# Patient Record
Sex: Male | Born: 1985 | Race: White | Hispanic: No | Marital: Single | Smoking: Current every day smoker
Health system: Southern US, Community
[De-identification: ages and names within clinical notes are randomized; demographics above are authoritative.]

## PROBLEM LIST (undated history)

## (undated) DIAGNOSIS — F17210 Nicotine dependence, cigarettes, uncomplicated: Secondary | ICD-10-CM

## (undated) DIAGNOSIS — J301 Allergic rhinitis due to pollen: Secondary | ICD-10-CM

## (undated) DIAGNOSIS — G5603 Carpal tunnel syndrome, bilateral upper limbs: Secondary | ICD-10-CM

## (undated) DIAGNOSIS — B019 Varicella without complication: Secondary | ICD-10-CM

## (undated) DIAGNOSIS — E781 Pure hyperglyceridemia: Secondary | ICD-10-CM

## (undated) DIAGNOSIS — K8689 Other specified diseases of pancreas: Secondary | ICD-10-CM

## (undated) DIAGNOSIS — L02412 Cutaneous abscess of left axilla: Secondary | ICD-10-CM

## (undated) DIAGNOSIS — R7989 Other specified abnormal findings of blood chemistry: Secondary | ICD-10-CM

## (undated) DIAGNOSIS — E119 Type 2 diabetes mellitus without complications: Secondary | ICD-10-CM

## (undated) DIAGNOSIS — Z9889 Other specified postprocedural states: Secondary | ICD-10-CM

## (undated) DIAGNOSIS — R112 Nausea with vomiting, unspecified: Secondary | ICD-10-CM

## (undated) DIAGNOSIS — E785 Hyperlipidemia, unspecified: Secondary | ICD-10-CM

## (undated) DIAGNOSIS — K859 Acute pancreatitis without necrosis or infection, unspecified: Secondary | ICD-10-CM

## (undated) DIAGNOSIS — F32A Depression, unspecified: Secondary | ICD-10-CM

## (undated) DIAGNOSIS — R42 Dizziness and giddiness: Secondary | ICD-10-CM

## (undated) DIAGNOSIS — F419 Anxiety disorder, unspecified: Secondary | ICD-10-CM

## (undated) DIAGNOSIS — R935 Abnormal findings on diagnostic imaging of other abdominal regions, including retroperitoneum: Secondary | ICD-10-CM

## (undated) DIAGNOSIS — K8020 Calculus of gallbladder without cholecystitis without obstruction: Secondary | ICD-10-CM

## (undated) HISTORY — DX: Varicella without complication: B01.9

## (undated) HISTORY — DX: Acute pancreatitis without necrosis or infection, unspecified: K85.90

## (undated) HISTORY — DX: Depression, unspecified: F32.A

## (undated) HISTORY — DX: Type 2 diabetes mellitus without complications: E11.9

## (undated) HISTORY — DX: Morbid (severe) obesity due to excess calories: E66.01

## (undated) HISTORY — DX: Allergic rhinitis due to pollen: J30.1

## (undated) HISTORY — DX: Hyperlipidemia, unspecified: E78.5

---
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
## 2021-01-07 ENCOUNTER — Inpatient Hospital Stay (HOSPITAL_COMMUNITY)
Admission: EM | Admit: 2021-01-07 | Discharge: 2021-01-11 | DRG: 637 | Disposition: A | Discharge status: Home / Self Care | Payer: Self-pay | Attending: Internal Medicine | Admitting: Internal Medicine

## 2021-01-07 ENCOUNTER — Emergency Department (HOSPITAL_COMMUNITY): Payer: Self-pay

## 2021-01-07 ENCOUNTER — Encounter (HOSPITAL_COMMUNITY): Payer: Self-pay | Admitting: Emergency Medicine

## 2021-01-07 DIAGNOSIS — R000 Tachycardia, unspecified: Secondary | ICD-10-CM | POA: Diagnosis present

## 2021-01-07 DIAGNOSIS — Z6834 Body mass index (BMI) 34.0-34.9, adult: Secondary | ICD-10-CM

## 2021-01-07 DIAGNOSIS — Z20822 Contact with and (suspected) exposure to covid-19: Secondary | ICD-10-CM | POA: Diagnosis present

## 2021-01-07 DIAGNOSIS — M62830 Muscle spasm of back: Secondary | ICD-10-CM | POA: Diagnosis not present

## 2021-01-07 DIAGNOSIS — E1110 Type 2 diabetes mellitus with ketoacidosis without coma: Principal | ICD-10-CM | POA: Diagnosis present

## 2021-01-07 DIAGNOSIS — F17210 Nicotine dependence, cigarettes, uncomplicated: Secondary | ICD-10-CM | POA: Diagnosis present

## 2021-01-07 DIAGNOSIS — E669 Obesity, unspecified: Secondary | ICD-10-CM | POA: Diagnosis present

## 2021-01-07 DIAGNOSIS — Z79899 Other long term (current) drug therapy: Secondary | ICD-10-CM

## 2021-01-07 DIAGNOSIS — R030 Elevated blood-pressure reading, without diagnosis of hypertension: Secondary | ICD-10-CM | POA: Diagnosis present

## 2021-01-07 DIAGNOSIS — K859 Acute pancreatitis without necrosis or infection, unspecified: Secondary | ICD-10-CM

## 2021-01-07 DIAGNOSIS — E7800 Pure hypercholesterolemia, unspecified: Secondary | ICD-10-CM | POA: Diagnosis present

## 2021-01-07 DIAGNOSIS — E876 Hypokalemia: Secondary | ICD-10-CM | POA: Diagnosis not present

## 2021-01-07 DIAGNOSIS — E781 Pure hyperglyceridemia: Secondary | ICD-10-CM | POA: Diagnosis present

## 2021-01-07 DIAGNOSIS — K858 Other acute pancreatitis without necrosis or infection: Secondary | ICD-10-CM | POA: Diagnosis present

## 2021-01-07 DIAGNOSIS — Z833 Family history of diabetes mellitus: Secondary | ICD-10-CM

## 2021-01-07 LAB — LIPID PANEL
Cholesterol: 308 mg/dL — ABNORMAL HIGH (ref 0–200)
Triglycerides: 1651 mg/dL — ABNORMAL HIGH (ref <150)
VLDL: UNDETERMINED mg/dL (ref 0–40)

## 2021-01-07 LAB — URINALYSIS, ROUTINE W REFLEX MICROSCOPIC
Bacteria, UA: NONE SEEN
Bilirubin Urine: NEGATIVE
Glucose, UA: >=500 mg/dL — AB
Ketones, ur: 80 mg/dL — AB
Leukocytes,Ua: NEGATIVE
Nitrite: NEGATIVE
Protein, ur: 30 mg/dL — AB
Specific Gravity, Urine: 1.032 — ABNORMAL HIGH (ref 1.005–1.030)
pH: 5 (ref 5.0–8.0)

## 2021-01-07 LAB — BASIC METABOLIC PANEL
Anion gap: 16 — ABNORMAL HIGH (ref 5–15)
Anion gap: 9 (ref 5–15)
BUN: 11 mg/dL (ref 6–20)
BUN: <5 mg/dL — ABNORMAL LOW (ref 6–20)
CO2: 17 mmol/L — ABNORMAL LOW (ref 22–32)
CO2: 19 mmol/L — ABNORMAL LOW (ref 22–32)
Calcium: 7.9 mg/dL — ABNORMAL LOW (ref 8.9–10.3)
Calcium: 8 mg/dL — ABNORMAL LOW (ref 8.9–10.3)
Chloride: 95 mmol/L — ABNORMAL LOW (ref 98–111)
Chloride: 99 mmol/L (ref 98–111)
Creatinine, Ser: 0.94 mg/dL (ref 0.61–1.24)
Creatinine, Ser: 0.67 mg/dL (ref 0.61–1.24)
GFR, Estimated: >60 mL/min (ref >60)
GFR, Estimated: >60 mL/min (ref >60)
Glucose, Bld: 338 mg/dL — ABNORMAL HIGH (ref 70–99)
Glucose, Bld: 222 mg/dL — ABNORMAL HIGH (ref 70–99)
Potassium: 4.4 mmol/L (ref 3.5–5.1)
Potassium: 3.7 mmol/L (ref 3.5–5.1)
Sodium: 128 mmol/L — ABNORMAL LOW (ref 135–145)
Sodium: 127 mmol/L — ABNORMAL LOW (ref 135–145)

## 2021-01-07 LAB — CBC
HCT: 51.3 % (ref 39.0–52.0)
Hemoglobin: 18.2 g/dL — ABNORMAL HIGH (ref 13.0–17.0)
MCH: 29.6 pg (ref 26.0–34.0)
MCHC: 35.5 g/dL (ref 30.0–36.0)
MCV: 83.6 fL (ref 80.0–100.0)
Platelets: 342 10*3/uL (ref 150–400)
RBC: 6.14 MIL/uL — ABNORMAL HIGH (ref 4.22–5.81)
RDW: 12.6 % (ref 11.5–15.5)
WBC: 22.8 10*3/uL — ABNORMAL HIGH (ref 4.0–10.5)
nRBC: 0 % (ref 0.0–0.2)

## 2021-01-07 LAB — LIPASE, BLOOD: Lipase: 890 U/L — ABNORMAL HIGH (ref 11–51)

## 2021-01-07 LAB — I-STAT VENOUS BLOOD GAS, ED
Acid-base deficit: 9 mmol/L — ABNORMAL HIGH (ref 0.0–2.0)
Bicarbonate: 13.5 mmol/L — ABNORMAL LOW (ref 20.0–28.0)
Calcium, Ion: 0.95 mmol/L — ABNORMAL LOW (ref 1.15–1.40)
HCT: 52 % (ref 39.0–52.0)
Hemoglobin: 17.7 g/dL — ABNORMAL HIGH (ref 13.0–17.0)
O2 Saturation: 92 %
Potassium: 4.5 mmol/L (ref 3.5–5.1)
Sodium: 126 mmol/L — ABNORMAL LOW (ref 135–145)
TCO2: 14 mmol/L — ABNORMAL LOW (ref 22–32)
pCO2, Ven: 23.5 mmHg — ABNORMAL LOW (ref 44.0–60.0)
pH, Ven: 7.366 (ref 7.250–7.430)
pO2, Ven: 64 mmHg — ABNORMAL HIGH (ref 32.0–45.0)

## 2021-01-07 LAB — COMPREHENSIVE METABOLIC PANEL
ALT: 38 U/L (ref 0–44)
AST: 29 U/L (ref 15–41)
Albumin: 3.8 g/dL (ref 3.5–5.0)
Alkaline Phosphatase: 65 U/L (ref 38–126)
Anion gap: 21 — ABNORMAL HIGH (ref 5–15)
BUN: 10 mg/dL (ref 6–20)
CO2: 8 mmol/L — ABNORMAL LOW (ref 22–32)
Calcium: 8.2 mg/dL — ABNORMAL LOW (ref 8.9–10.3)
Chloride: 96 mmol/L — ABNORMAL LOW (ref 98–111)
Creatinine, Ser: 1.14 mg/dL (ref 0.61–1.24)
GFR, Estimated: >60 mL/min (ref >60)
Glucose, Bld: 550 mg/dL (ref 70–99)
Potassium: 5.1 mmol/L (ref 3.5–5.1)
Sodium: 125 mmol/L — ABNORMAL LOW (ref 135–145)
Total Bilirubin: 2.9 mg/dL — ABNORMAL HIGH (ref 0.3–1.2)
Total Protein: 6.7 g/dL (ref 6.5–8.1)

## 2021-01-07 LAB — GLUCOSE, CAPILLARY
Glucose-Capillary: 188 mg/dL — ABNORMAL HIGH (ref 70–99)
Glucose-Capillary: 203 mg/dL — ABNORMAL HIGH (ref 70–99)
Glucose-Capillary: 208 mg/dL — ABNORMAL HIGH (ref 70–99)
Glucose-Capillary: 228 mg/dL — ABNORMAL HIGH (ref 70–99)
Glucose-Capillary: 237 mg/dL — ABNORMAL HIGH (ref 70–99)
Glucose-Capillary: 240 mg/dL — ABNORMAL HIGH (ref 70–99)
Glucose-Capillary: 264 mg/dL — ABNORMAL HIGH (ref 70–99)

## 2021-01-07 LAB — BETA-HYDROXYBUTYRIC ACID: Beta-Hydroxybutyric Acid: 4.67 mmol/L — ABNORMAL HIGH (ref 0.05–0.27)

## 2021-01-07 LAB — CBG MONITORING, ED
Glucose-Capillary: 374 mg/dL — ABNORMAL HIGH (ref 70–99)
Glucose-Capillary: 311 mg/dL — ABNORMAL HIGH (ref 70–99)

## 2021-01-07 LAB — RESP PANEL BY RT-PCR (FLU A&B, COVID) ARPGX2
Influenza A by PCR: NEGATIVE
Influenza B by PCR: NEGATIVE
SARS Coronavirus 2 by RT PCR: NEGATIVE

## 2021-01-07 LAB — HIV ANTIBODY (ROUTINE TESTING W REFLEX): HIV Screen 4th Generation wRfx: NONREACTIVE

## 2021-01-07 MED ORDER — LACTATED RINGERS IV BOLUS
20.0000 mL/kg | Freq: Once | INTRAVENOUS | Status: AC
  Administered 2021-01-07: 1974 mL via INTRAVENOUS

## 2021-01-07 MED ORDER — ONDANSETRON HCL 4 MG/2ML IJ SOLN
4.0000 mg | Freq: Once | INTRAMUSCULAR | Status: AC
  Administered 2021-01-07: 4 mg via INTRAVENOUS
  Filled 2021-01-07: qty 2

## 2021-01-07 MED ORDER — HYDROMORPHONE HCL 1 MG/ML IJ SOLN
0.5000 mg | INTRAMUSCULAR | Status: DC | PRN
  Administered 2021-01-07 – 2021-01-08 (×3): 0.5 mg via INTRAVENOUS
  Filled 2021-01-07 (×2): qty 0.5
  Filled 2021-01-07: qty 1

## 2021-01-07 MED ORDER — NICOTINE 14 MG/24HR TD PT24
14.0000 mg | MEDICATED_PATCH | Freq: Every day | TRANSDERMAL | Status: DC | PRN
  Administered 2021-01-07: 14 mg via TRANSDERMAL
  Filled 2021-01-07: qty 1

## 2021-01-07 MED ORDER — LACTATED RINGERS IV BOLUS
1000.0000 mL | Freq: Once | INTRAVENOUS | Status: AC
  Administered 2021-01-07: 1000 mL via INTRAVENOUS

## 2021-01-07 MED ORDER — LACTATED RINGERS IV SOLN: INTRAVENOUS | Status: DC

## 2021-01-07 MED ORDER — IOHEXOL 300 MG/ML  SOLN
99.0000 mL | Freq: Once | INTRAMUSCULAR | Status: AC | PRN
  Administered 2021-01-07: 99 mL via INTRAVENOUS

## 2021-01-07 MED ORDER — DEXTROSE 50 % IV SOLN: 0.0000 mL | INTRAVENOUS | Status: DC | PRN

## 2021-01-07 MED ORDER — INSULIN REGULAR(HUMAN) IN NACL 100-0.9 UT/100ML-% IV SOLN
INTRAVENOUS | Status: DC
  Administered 2021-01-07: 8.5 [IU]/h via INTRAVENOUS
  Filled 2021-01-07: qty 100

## 2021-01-07 MED ORDER — DEXTROSE IN LACTATED RINGERS 5 % IV SOLN
INTRAVENOUS | Status: DC
  Filled 2021-01-07: qty 1000

## 2021-01-07 MED ORDER — MORPHINE SULFATE (PF) 4 MG/ML IV SOLN
6.0000 mg | Freq: Once | INTRAVENOUS | Status: AC
  Administered 2021-01-07: 6 mg via INTRAVENOUS
  Filled 2021-01-07: qty 2

## 2021-01-07 MED ORDER — ONDANSETRON HCL 4 MG/2ML IJ SOLN
4.0000 mg | Freq: Four times a day (QID) | INTRAMUSCULAR | Status: DC | PRN
  Administered 2021-01-07 – 2021-01-11 (×5): 4 mg via INTRAVENOUS
  Filled 2021-01-07 (×5): qty 2

## 2021-01-07 MED ORDER — BACLOFEN 10 MG PO TABS
5.0000 mg | ORAL_TABLET | Freq: Three times a day (TID) | ORAL | Status: DC | PRN
  Administered 2021-01-07 – 2021-01-09 (×3): 5 mg via ORAL
  Filled 2021-01-07 (×3): qty 1

## 2021-01-07 MED ORDER — ENOXAPARIN SODIUM 40 MG/0.4ML ~~LOC~~ SOLN
40.0000 mg | SUBCUTANEOUS | Status: DC
  Administered 2021-01-08 – 2021-01-10 (×3): 40 mg via SUBCUTANEOUS
  Filled 2021-01-07 (×3): qty 0.4

## 2021-01-07 NOTE — H&P (Signed)
Date: 01/07/2021               Patient Name:  George Howe MRN: [id number]  DOB: 02/23/1986 Age / Sex: 35 y.o., male   PCP: Default, Provider, MD         Medical Service: Internal Medicine Teaching Service         Attending Physician: Dr. Mayford Knife, Dorene Ar, MD    First Contact: Dr. Austin Miles Pager: [phone]  Second Contact: Dr. Huel Cote Pager: [phone]       After Hours (After 5p/  First Contact Pager: [phone]  weekends / holidays): Second Contact Pager: [phone]   Chief Complaint: Abdominal pain, nausea, vomiting  History of Present Illness:   George Howe is a 35 year old man with no significant past medical history presenting with left sided abdominal pain, nausea, and vomiting that began yesterday. He reports that he was in his usual state of health until yesterday afternoon when he suddenly developed left sided stomach pain. His pain was constant and did not radiate anywhere else. This was followed by nausea and multiple episodes of nonbilious nonbloody vomiting. He tried to remain hydrated with fluids and 2 packs of liquid IV, but he would vomit everything he took in. He denies fevers, chills, shortness of breath, diarrhea, constipation, hematuria, fatigue. He does endorse increased urination, but states that it is normal for him because he always drinks a lot of fluids. He denies any change in thirst or urinary frequency.  He does not have any history of diabetes, but does state that diabetes runs rampant in his family especially on his dad's side. He states that his dad was once hospitalized and found to have blood glucose levels >1200 and he eventually passed away from diabetes. He is unsure if any of his family members have type 1 diabetes.    He denies any history of gallstones or recent abdominal trauma. He does not know if he has a family history of high triglycerides.   ED Course: CBC with leukocytosis (22.8) and Hgb 18.2. CMP showing sodium 125, potassium 5.1,  bicarb 8, glucose 550, and anion gap 21. Lipase 890. Urinalysis showing severe glucosuria (>500) and ketonuria (80). Beta-hydroxybutyric acid 4.67. VBG showing pH 7.36, pCO2 23.5, bicarb 13.5. CT abd/pel w contrast with findings consistent with acute pancreatitis, but no evidence of pancreatic pseudocyst or pancreatic necrosis.   Meds:  Current Meds  Medication Sig  . famotidine (PEPCID) 10 MG tablet Take 10 mg by mouth once. This was given by a friend  . Multiple Vitamins-Minerals (MULTIVITAMIN WITH MINERALS) tablet Take 1 tablet by mouth daily.  . ondansetron (ZOFRAN) 4 MG tablet Take 4 mg by mouth once. This was given by a friend.    Allergies: Allergies as of 01/07/2021  . (No Known Allergies)   History reviewed. No pertinent past medical history.  Family History: Strong history of diabetes in the family (unsure if type 1 or type 2) -father passed away from diabetes (sugar was >1200 in the hospital) -paternal uncle, paternal grandfather both also had diabetes  Social History:  Lives alone, works as a Investment banker, operational. Occasional alcohol use, denies illicit substance use. 16 pack-year history of cigarette smoking, currently still smokes.  Review of Systems: A complete ROS was negative except as per HPI.   Physical Exam: Blood pressure (!) 138/96, pulse (!) 121, temperature 98.2 F (36.8 C), temperature source Oral, resp. rate (!) 25, height 5\' 7"  (1.702 m), weight 98.7 kg, SpO2 97 %.  Physical Exam Constitutional:      Appearance: He is obese. He is not ill-appearing.  HENT:     Head: Normocephalic and atraumatic.  Eyes:     Extraocular Movements: Extraocular movements intact.     Pupils: Pupils are equal, round, and reactive to light.  Cardiovascular:     Rate and Rhythm: Tachycardia present.     Heart sounds: Normal heart sounds. No murmur heard. No friction rub. No gallop.   Pulmonary:     Effort: Pulmonary effort is normal.     Breath sounds: Normal breath sounds. No wheezing,  rhonchi or rales.  Abdominal:     General: Bowel sounds are decreased.     Palpations: Abdomen is soft. There is no mass.     Tenderness: There is no guarding or rebound.     Hernia: No hernia is present.     Comments: Mild left sided abdominal tenderness to palpation.  Skin:    General: Skin is warm and dry.  Neurological:     General: No focal deficit present.     Mental Status: He is alert and oriented to person, place, and time.  Psychiatric:        Mood and Affect: Mood is anxious.        Behavior: Behavior normal.      EKG: personally reviewed my interpretation is sinus tachycardia, borderline QT prolongation   CT Abdomen Pelvis W Contrast  Result Date: 01/07/2021 CLINICAL DATA:  Abdominal distension since yesterday. EXAM: CT ABDOMEN AND PELVIS WITH CONTRAST TECHNIQUE: Multidetector CT imaging of the abdomen and pelvis was performed using the standard protocol following bolus administration of intravenous contrast. CONTRAST:  37mL OMNIPAQUE IOHEXOL 300 MG/ML  SOLN COMPARISON:  None. FINDINGS: Lower chest: No acute abnormality. Hepatobiliary: Diffuse low density of liver is identified. No focal liver lesion is noted. The gallbladder is normal. There is a tree is normal. Pancreas: There is marked stranding and fluid surrounding the pancreas. No pancreatic pseudocyst is identified. There is no evidence of pancreatic necrosis. Spleen: Normal in size without focal abnormality. Adrenals/Urinary Tract: Adrenal glands are unremarkable. Kidneys are normal, without renal calculi, focal lesion, or hydronephrosis. Bladder is unremarkable. Stomach/Bowel: Stomach is within normal limits. Appendix appears normal. No evidence of bowel wall thickening, distention, or inflammatory changes. Vascular/Lymphatic: No significant vascular findings are present. No enlarged abdominal or pelvic lymph nodes. Reproductive: Prostate is unremarkable. Other: None. Musculoskeletal: No acute or significant osseous  findings. IMPRESSION: 1. Findings consistent with acute pancreatitis. No pancreatic pseudocyst or evidence of pancreatic necrosis. 2. Fatty infiltration of liver. Electronically Signed   By: Sherian Rein M.D.   On: 01/07/2021 14:09    Assessment & Plan by Problem: Active Problems:   Diabetic ketoacidosis (HCC)  Diabetic Ketoacidosis Patient with no personal history of diabetes but significant paternal family history of diabetes. Labs consistent with diabetic ketoacidosis and he was started on treatment per DKA protocol. Repeat BMP showing sodium 128, potassium 4.4, bicarb 17, glucose 338, anion gap 16. This suggests improvement of DKA, although patient is still actively in DKA at this time and will need continued treatment per DKA protocol (see below). He has received 3L of LR bolus thus far and is currently on maintenance fluids. Can switch patient off of EndoTool to subcutaneous insulin once anion gap is closed on two consecutive BMPs.  - NPO - insulin infusion per EndoTool to maintain glucose between 140 and 180 - LR infusion @125  cc/hr and change to D5-LR @125  cc/hr  when CBG <250 - IV zofran 4mg  q6h prn for nausea and vomiting - IV dilaudid 0.5mg  q4h prn for severe pain - q4h BMP - q8h beta-hydroxybutyric acid - CBG monitoring - Strict intake and output - Cardiac monitoring - Hemoglobin A1c pending  Acute Pancreatitis 2/2 hypertriglyceridemia Patient denies any recent trauma or history of gallstones.  No prior history of acute pancreatitis.  Lipid panel showing hypertriglyceridemia at 1,651.  Patient's acute pancreatitis likely secondary to high triglycerides.  Patient will need triglyceride-lowering therapy with fibrates once he can tolerate oral intake.  Until then, we will continue with aggressive fluid rehydration, insulin infusion per EndoTool, n.p.o., pain control with Dilaudid as mentioned above.  A unifying explanation for this patient's presentation is likely development of  diabetic ketoacidosis from uncontrolled/undiagnosed diabetes, leading to severely elevated triglycerides which, in turn, led to the development of acute pancreatitis.   Dispo: Admit patient to Inpatient with expected length of stay greater than 2 midnights.  Signed: , MD 01/07/2021, 5:22 PM  Pager: [phone] After 5pm on weekdays and 1pm on weekends: On Call pager: [phone]

## 2021-01-07 NOTE — ED Triage Notes (Signed)
C/o L sided abd with nausea and vomiting since yesterday.

## 2021-01-07 NOTE — ED Provider Notes (Addendum)
[facility] EMERGENCY DEPARTMENT Provider Note   CSN: [id number] Arrival date & time: 01/07/21  1112     History Chief Complaint  Patient presents with  . Abdominal Pain    Tytan Sandate is a 35 y.o. male.  35 year old male presents with left abdominal discomfort which began yesterday.  He has had nonbilious emesis without diarrhea.  No fever or chills.  No urinary symptoms.  Pain is been persistent and not colicky.  No prior history of similar.  No family history of inflammatory bowel disease.  No treatment use prior to arrival        History reviewed. No pertinent past medical history.  There are no problems to display for this patient.   History reviewed. No pertinent surgical history.     No family history on file.  Social History   Tobacco Use  . Smoking status: Current Every Day Smoker  . Smokeless tobacco: Never Used  Substance Use Topics  . Alcohol use: Not Currently  . Drug use: Not Currently    Home Medications Prior to Admission medications   Not on File    Allergies    Patient has no allergy information on record.  Review of Systems   Review of Systems  All other systems reviewed and are negative.   Physical Exam Updated Vital Signs BP (!) 155/113 (BP Location: Right Arm)   Pulse (!) 133   Temp 98.2 F (36.8 C) (Oral)   Resp 18   SpO2 98%   Physical Exam Vitals and nursing note reviewed.  Constitutional:      General: He is not in acute distress.    Appearance: Normal appearance. He is well-developed. He is not toxic-appearing.  HENT:     Head: Normocephalic and atraumatic.  Eyes:     General: Lids are normal.     Conjunctiva/sclera: Conjunctivae normal.     Pupils: Pupils are equal, round, and reactive to light.  Neck:     Thyroid: No thyroid mass.     Trachea: No tracheal deviation.  Cardiovascular:     Rate and Rhythm: Normal rate and regular rhythm.     Heart sounds: Normal heart sounds. No murmur  heard. No gallop.   Pulmonary:     Effort: Pulmonary effort is normal. No respiratory distress.     Breath sounds: Normal breath sounds. No stridor. No decreased breath sounds, wheezing, rhonchi or rales.  Abdominal:     General: Bowel sounds are normal. There is no distension.     Palpations: Abdomen is soft.     Tenderness: There is abdominal tenderness in the left lower quadrant. There is no rebound.    Musculoskeletal:        General: No tenderness. Normal range of motion.     Cervical back: Normal range of motion and neck supple.  Skin:    General: Skin is warm and dry.     Findings: No abrasion or rash.  Neurological:     Mental Status: He is alert and oriented to person, place, and time.     GCS: GCS eye subscore is 4. GCS verbal subscore is 5. GCS motor subscore is 6.     Cranial Nerves: No cranial nerve deficit.     Sensory: No sensory deficit.  Psychiatric:        Speech: Speech normal.        Behavior: Behavior normal.     ED Results / Procedures / Treatments   Labs (all  labs ordered are listed, but only abnormal results are displayed) Labs Reviewed  CBC - Abnormal; Notable for the following components:      Result Value   WBC 22.8 (*)    RBC 6.14 (*)    Hemoglobin 18.2 (*)    All other components within normal limits  RESP PANEL BY RT-PCR (FLU A&B, COVID) ARPGX2  LIPASE, BLOOD  COMPREHENSIVE METABOLIC PANEL  URINALYSIS, ROUTINE W REFLEX MICROSCOPIC    EKG None  Radiology No results found.  Procedures Procedures   Medications Ordered in ED Medications  lactated ringers infusion (has no administration in time range)  lactated ringers bolus 1,000 mL (has no administration in time range)  morphine 4 MG/ML injection 6 mg (has no administration in time range)  ondansetron (ZOFRAN) injection 4 mg (has no administration in time range)    ED Course  I have reviewed the triage vital signs and the nursing notes.  Pertinent labs & imaging results that  were available during my care of the patient were reviewed by me and considered in my medical decision making (see chart for details).    MDM Rules/Calculators/A&P                          Patient labs consistent with pancreatitis as well as diabetic ketoacidosis.  Pancreatitis was confirmed by abdominal CT.  Started on insulin drip as well as given IV hydration.  Will be admitted to the hospital service  CRITICAL CARE Performed by: Toy Baker Total critical care time: 55 minutes Critical care time was exclusive of separately billable procedures and treating other patients. Critical care was necessary to treat or prevent imminent or life-threatening deterioration. Critical care was time spent personally by me on the following activities: development of treatment plan with patient and/or surrogate as well as nursing, discussions with consultants, evaluation of patient's response to treatment, examination of patient, obtaining history from patient or surrogate, ordering and performing treatments and interventions, ordering and review of laboratory studies, ordering and review of radiographic studies, pulse oximetry and re-evaluation of patient's condition.  Final Clinical Impression(s) / ED Diagnoses Final diagnoses:  None    Rx / DC Orders ED Discharge Orders    None       Lorre Nick, MD 01/07/21 1445    Lorre Nick, MD 01/07/21 1446

## 2021-01-07 NOTE — ED Notes (Signed)
Pt transported to CT ?

## 2021-01-08 DIAGNOSIS — K859 Acute pancreatitis without necrosis or infection, unspecified: Secondary | ICD-10-CM

## 2021-01-08 LAB — CBC WITH DIFFERENTIAL/PLATELET
Abs Immature Granulocytes: 0.09 10*3/uL — ABNORMAL HIGH (ref 0.00–0.07)
Basophils Absolute: 0.1 10*3/uL (ref 0.0–0.1)
Basophils Relative: 0 %
Eosinophils Absolute: 0 10*3/uL (ref 0.0–0.5)
Eosinophils Relative: 0 %
HCT: 45.3 % (ref 39.0–52.0)
Hemoglobin: 16.1 g/dL (ref 13.0–17.0)
Immature Granulocytes: 1 %
Lymphocytes Relative: 8 %
Lymphs Abs: 1.2 10*3/uL (ref 0.7–4.0)
MCH: 29.9 pg (ref 26.0–34.0)
MCHC: 35.5 g/dL (ref 30.0–36.0)
MCV: 84 fL (ref 80.0–100.0)
Monocytes Absolute: 0.5 10*3/uL (ref 0.1–1.0)
Monocytes Relative: 4 %
Neutro Abs: 12.9 10*3/uL — ABNORMAL HIGH (ref 1.7–7.7)
Neutrophils Relative %: 87 %
Platelets: 198 10*3/uL (ref 150–400)
RBC: 5.39 MIL/uL (ref 4.22–5.81)
RDW: 12.9 % (ref 11.5–15.5)
WBC: 14.9 10*3/uL — ABNORMAL HIGH (ref 4.0–10.5)
nRBC: 0 % (ref 0.0–0.2)

## 2021-01-08 LAB — BASIC METABOLIC PANEL
Anion gap: 8 (ref 5–15)
Anion gap: 8 (ref 5–15)
Anion gap: 9 (ref 5–15)
BUN: <5 mg/dL — ABNORMAL LOW (ref 6–20)
BUN: 5 mg/dL — ABNORMAL LOW (ref 6–20)
BUN: 5 mg/dL — ABNORMAL LOW (ref 6–20)
CO2: 23 mmol/L (ref 22–32)
CO2: 24 mmol/L (ref 22–32)
CO2: 22 mmol/L (ref 22–32)
Calcium: 8 mg/dL — ABNORMAL LOW (ref 8.9–10.3)
Calcium: 8.2 mg/dL — ABNORMAL LOW (ref 8.9–10.3)
Calcium: 8 mg/dL — ABNORMAL LOW (ref 8.9–10.3)
Chloride: 100 mmol/L (ref 98–111)
Chloride: 100 mmol/L (ref 98–111)
Chloride: 98 mmol/L (ref 98–111)
Creatinine, Ser: 0.67 mg/dL (ref 0.61–1.24)
Creatinine, Ser: 0.63 mg/dL (ref 0.61–1.24)
Creatinine, Ser: 0.67 mg/dL (ref 0.61–1.24)
GFR, Estimated: >60 mL/min (ref >60)
GFR, Estimated: >60 mL/min (ref >60)
GFR, Estimated: >60 mL/min (ref >60)
Glucose, Bld: 183 mg/dL — ABNORMAL HIGH (ref 70–99)
Glucose, Bld: 150 mg/dL — ABNORMAL HIGH (ref 70–99)
Glucose, Bld: 217 mg/dL — ABNORMAL HIGH (ref 70–99)
Potassium: 3.7 mmol/L (ref 3.5–5.1)
Potassium: 3.6 mmol/L (ref 3.5–5.1)
Potassium: 3.6 mmol/L (ref 3.5–5.1)
Sodium: 131 mmol/L — ABNORMAL LOW (ref 135–145)
Sodium: 132 mmol/L — ABNORMAL LOW (ref 135–145)
Sodium: 129 mmol/L — ABNORMAL LOW (ref 135–145)

## 2021-01-08 LAB — BETA-HYDROXYBUTYRIC ACID
Beta-Hydroxybutyric Acid: 0.44 mmol/L — ABNORMAL HIGH (ref 0.05–0.27)
Beta-Hydroxybutyric Acid: 0.66 mmol/L — ABNORMAL HIGH (ref 0.05–0.27)
Beta-Hydroxybutyric Acid: 0.78 mmol/L — ABNORMAL HIGH (ref 0.05–0.27)

## 2021-01-08 LAB — GLUCOSE, CAPILLARY
Glucose-Capillary: 126 mg/dL — ABNORMAL HIGH (ref 70–99)
Glucose-Capillary: 151 mg/dL — ABNORMAL HIGH (ref 70–99)
Glucose-Capillary: 157 mg/dL — ABNORMAL HIGH (ref 70–99)
Glucose-Capillary: 157 mg/dL — ABNORMAL HIGH (ref 70–99)
Glucose-Capillary: 163 mg/dL — ABNORMAL HIGH (ref 70–99)
Glucose-Capillary: 164 mg/dL — ABNORMAL HIGH (ref 70–99)
Glucose-Capillary: 164 mg/dL — ABNORMAL HIGH (ref 70–99)
Glucose-Capillary: 166 mg/dL — ABNORMAL HIGH (ref 70–99)
Glucose-Capillary: 169 mg/dL — ABNORMAL HIGH (ref 70–99)
Glucose-Capillary: 173 mg/dL — ABNORMAL HIGH (ref 70–99)
Glucose-Capillary: 180 mg/dL — ABNORMAL HIGH (ref 70–99)
Glucose-Capillary: 180 mg/dL — ABNORMAL HIGH (ref 70–99)
Glucose-Capillary: 181 mg/dL — ABNORMAL HIGH (ref 70–99)
Glucose-Capillary: 194 mg/dL — ABNORMAL HIGH (ref 70–99)
Glucose-Capillary: 194 mg/dL — ABNORMAL HIGH (ref 70–99)
Glucose-Capillary: 205 mg/dL — ABNORMAL HIGH (ref 70–99)
Glucose-Capillary: 208 mg/dL — ABNORMAL HIGH (ref 70–99)
Glucose-Capillary: 218 mg/dL — ABNORMAL HIGH (ref 70–99)
Glucose-Capillary: 245 mg/dL — ABNORMAL HIGH (ref 70–99)
Glucose-Capillary: 261 mg/dL — ABNORMAL HIGH (ref 70–99)

## 2021-01-08 LAB — TRIGLYCERIDES
Triglycerides: 282 mg/dL — ABNORMAL HIGH (ref <150)
Triglycerides: 270 mg/dL — ABNORMAL HIGH (ref <150)

## 2021-01-08 MED ORDER — INSULIN GLARGINE 100 UNIT/ML ~~LOC~~ SOLN
50.0000 [IU] | Freq: Once | SUBCUTANEOUS | Status: AC
  Administered 2021-01-08: 50 [IU] via SUBCUTANEOUS
  Filled 2021-01-08: qty 0.5

## 2021-01-08 MED ORDER — ACETAMINOPHEN 325 MG PO TABS
650.0000 mg | ORAL_TABLET | Freq: Four times a day (QID) | ORAL | Status: DC | PRN
  Administered 2021-01-08 – 2021-01-10 (×4): 650 mg via ORAL
  Filled 2021-01-08 (×4): qty 2

## 2021-01-08 MED ORDER — LIVING WELL WITH DIABETES BOOK
Freq: Once | Status: DC
  Filled 2021-01-08: qty 1

## 2021-01-08 MED ORDER — NICOTINE 21 MG/24HR TD PT24
21.0000 mg | MEDICATED_PATCH | Freq: Every day | TRANSDERMAL | Status: DC
  Administered 2021-01-08 – 2021-01-11 (×4): 21 mg via TRANSDERMAL
  Filled 2021-01-08 (×4): qty 1

## 2021-01-08 MED ORDER — INSULIN REGULAR(HUMAN) IN NACL 100-0.9 UT/100ML-% IV SOLN
INTRAVENOUS | Status: DC
  Administered 2021-01-08: 4.6 [IU]/h via INTRAVENOUS
  Administered 2021-01-09: 2.4 [IU]/h via INTRAVENOUS
  Filled 2021-01-08 (×2): qty 100

## 2021-01-08 MED ORDER — HYDROMORPHONE HCL 1 MG/ML IJ SOLN
0.5000 mg | INTRAMUSCULAR | Status: DC | PRN
  Administered 2021-01-08: 1 mg via INTRAVENOUS
  Administered 2021-01-08 (×2): 0.5 mg via INTRAVENOUS
  Administered 2021-01-09: 1 mg via INTRAVENOUS
  Administered 2021-01-09 (×2): 0.5 mg via INTRAVENOUS
  Administered 2021-01-09 – 2021-01-11 (×6): 1 mg via INTRAVENOUS
  Filled 2021-01-08 (×2): qty 1
  Filled 2021-01-08: qty 0.5
  Filled 2021-01-08 (×2): qty 1
  Filled 2021-01-08 (×2): qty 0.5
  Filled 2021-01-08 (×3): qty 1
  Filled 2021-01-08: qty 0.5
  Filled 2021-01-08 (×2): qty 1

## 2021-01-08 MED ORDER — LACTATED RINGERS IV BOLUS
1000.0000 mL | Freq: Once | INTRAVENOUS | Status: AC
  Administered 2021-01-08: 1000 mL via INTRAVENOUS

## 2021-01-08 MED ORDER — LACTATED RINGERS IV BOLUS
500.0000 mL | Freq: Once | INTRAVENOUS | Status: AC
  Administered 2021-01-08: 500 mL via INTRAVENOUS

## 2021-01-08 MED ORDER — INSULIN ASPART 100 UNIT/ML ~~LOC~~ SOLN: 0.0000 [IU] | Freq: Three times a day (TID) | SUBCUTANEOUS | Status: DC

## 2021-01-08 NOTE — Progress Notes (Signed)
Inpatient Diabetes Program Recommendations  AACE/ADA: New Consensus Statement on Inpatient Glycemic Control  Target Ranges:  Prepandial:   less than 140 mg/dL      Peak postprandial:   less than 180 mg/dL (1-2 hours)      Critically ill patients:  140 - 180 mg/dL   Results for FAVIAN, KITTLESON (MRN [id number]) as of 01/08/2021 09:22  Ref. Range 01/08/2021 00:47 01/08/2021 01:55 01/08/2021 03:04 01/08/2021 04:05 01/08/2021 05:04 01/08/2021 06:02 01/08/2021 06:52 01/08/2021 08:18  Glucose-Capillary Latest Ref Range: 70 - 99 mg/dL 858 (H) 850 (H) 277 (H) 208 (H) 173 (H) 166 (H) 157 (H) 181 (H)  Results for CLAIRE, BRIDGE (MRN [id number]) as of 01/08/2021 09:22  Ref. Range 01/07/2021 11:29 01/07/2021 15:05  Beta-Hydroxybutyric Acid Latest Ref Range: 0.05 - 0.27 mmol/L  4.67 (H)  Glucose Latest Ref Range: 70 - 99 mg/dL 720 (HH)    Review of Glycemic Control  Diabetes history: No Outpatient Diabetes medications: NA Current orders for Inpatient glycemic control: Novolog 0-15 units TID with meals  Inpatient Diabetes Program Recommendations:    Insulin: Noted IV insulin was ordered which was stopped and now Novolog correction scale ordered. No SQ insulin given at time of stopping IV insulin. If CBGs are consistently over 180 mg/dl with Novolog correction, please consider ordering basal insulin.  HbgA1C: Current A1C in process.  NOTE: Noted consult for Diabetes Coordinator. Diabetes Coordinator is not on campus over the weekend but available by pager from 8am to 5pm for questions or concerns. Chart reviewed. Per chart, patient has no prior DM hx, no PCP, and no insurance. Ordered Living well with DM book, RD consult for diet education, TOC consult for assistance with follow up and medications if needed, and patient education by bedside RNs. Inpatient diabetes coordinator will follow up with patient on Monday regarding new DM dx.  Thanks, Orlando Penner, RN, MSN, CDE Diabetes Coordinator Inpatient Diabetes  Program [phone] (Team Pager from 8am to 5pm)

## 2021-01-08 NOTE — Plan of Care (Signed)
  Problem: Education: Goal: Knowledge of General Education information will improve Description: Including pain rating scale, medication(s)/side effects and non-pharmacologic comfort measures 01/08/2021 0214 by Ernest Haber, RN Outcome: Progressing 01/08/2021 0213 by Ernest Haber, RN Outcome: Progressing   Problem: Health Behavior/Discharge Planning: Goal: Ability to manage health-related needs will improve 01/08/2021 0214 by Ernest Haber, RN Outcome: Progressing 01/08/2021 0213 by Ernest Haber, RN Outcome: Progressing   Problem: Clinical Measurements: Goal: Ability to maintain clinical measurements within normal limits will improve 01/08/2021 0214 by Ernest Haber, RN Outcome: Progressing 01/08/2021 0213 by Ernest Haber, RN Outcome: Progressing Goal: Will remain free from infection 01/08/2021 0214 by Ernest Haber, RN Outcome: Progressing 01/08/2021 0213 by Ernest Haber, RN Outcome: Progressing Goal: Diagnostic test results will improve 01/08/2021 0214 by Ernest Haber, RN Outcome: Progressing 01/08/2021 0213 by Ernest Haber, RN Outcome: Progressing Goal: Respiratory complications will improve 01/08/2021 0214 by Ernest Haber, RN Outcome: Progressing 01/08/2021 0213 by Ernest Haber, RN Outcome: Progressing Goal: Cardiovascular complication will be avoided 01/08/2021 0214 by Ernest Haber, RN Outcome: Progressing 01/08/2021 0213 by Ernest Haber, RN Outcome: Progressing   Problem: Activity: Goal: Risk for activity intolerance will decrease 01/08/2021 0214 by Ernest Haber, RN Outcome: Progressing 01/08/2021 0213 by Ernest Haber, RN Outcome: Progressing   Problem: Nutrition: Goal: Adequate nutrition will be maintained 01/08/2021 0214 by Ernest Haber, RN Outcome: Progressing 01/08/2021 0213 by Ernest Haber, RN Outcome: Progressing   Problem: Coping: Goal: Level of anxiety will  decrease 01/08/2021 0214 by Ernest Haber, RN Outcome: Progressing 01/08/2021 0213 by Ernest Haber, RN Outcome: Progressing   Problem: Pain Managment: Goal: General experience of comfort will improve 01/08/2021 0214 by Ernest Haber, RN Outcome: Progressing 01/08/2021 0213 by Ernest Haber, RN Outcome: Progressing

## 2021-01-08 NOTE — Progress Notes (Signed)
   Subjective:   No acute overnight events.  He reports feeling good today, although he is still nauseous and was unable to have breakfast this morning.  States that he was able to have some applesauce earlier.  Updated patient on his progress.  States that when he first came in he was very apprehensive because his father had passed away from undiagnosed diabetes that led to a coma and eventual death.  Reassured patient that his labs are improving and that at this time there is no concern that he will deteriorate acutely.  Objective:  Vital signs in last 24 hours: Vitals:   01/07/21 1858 01/07/21 2136 01/08/21 0130 01/08/21 0416  BP: (!) 134/105 (!) 142/82 125/77 (!) 135/107  Pulse: (!) 126 (!) 112 (!) 115 88  Resp: (!) 21 20 18 15   Temp: 98.4 F (36.9 C) 98.3 F (36.8 C) 98.5 F (36.9 C) 98.3 F (36.8 C)  TempSrc: Oral Axillary Oral Axillary  SpO2: 97% 96% 98% 96%  Weight:      Height:       General: Obese male, lying in bed, NAD. Cardiovascular: Tachycardic rate and normal rhythm, no murmurs rubs or gallops. Pulmonary: Pulmonary effort is normal, clear to auscultation bilaterally. Abdomen: Bowel sounds are hypoactive.  Abdomen is soft, nondistended, but has tenderness to palpation in the right middle quadrant and left upper quadrant. Skin: Warm and dry. Neurological: AAOx3, no focal deficits.  Assessment/Plan:  Active Problems:   Diabetic ketoacidosis (HCC)  DKA Patient's anion gap closed x2 overnight.  He was switched to subcutaneous insulin and received 50 units of Lantus early this morning.  He also requested food, and his diet was advanced to carb modified.  However patient was unable to tolerate p.o. intake.  He was switched back to insulin infusion to also help with his hypertriglyceridemia and acute pancreatitis.  Fluids transitioned to D5-LR at 125 cc/hr as CBGs are less than 250.  IV Dilaudid still on for pain control.  Hemoglobin A1c still pending.  Continue CBG  monitoring.  Continue trending BMP. He will require diabetic medications at discharge with outpatient follow up with PCP to ensure continued glycemic control and prevention of further episodes of DKA.  Patient wishes to establish care at [facility] after discharge so we will schedule follow-up once he is medically stable for discharge.  Acute Pancreatitis 2/2 hypertriglyceridemia Patient's triglycerides 1,651 with resultant acute pancreatitis.  Triglycerides 282 today.  Although hypertriglyceridemia is improved, he will require additional triglyceride-lowering therapy at discharge with statin and/or fibrate.  Continue checking triglycerides twice daily.   Prior to Admission Living Arrangement: Home Anticipated Discharge Location: TBD Barriers to Discharge: continued medical management Dispo: Anticipated discharge in approximately 2-3 day(s).   [facility], MD 01/08/2021, 7:00 AM Pager: [phone] After 5pm on weekdays and 1pm on weekends: On Call pager [phone]

## 2021-01-08 NOTE — Plan of Care (Signed)
  Problem: Education: Goal: Knowledge of General Education information will improve Description: Including pain rating scale, medication(s)/side effects and non-pharmacologic comfort measures Outcome: Progressing   Problem: Clinical Measurements: Goal: Ability to maintain clinical measurements within normal limits will improve Outcome: Progressing Goal: Will remain free from infection Outcome: Progressing Goal: Diagnostic test results will improve Outcome: Progressing Goal: Respiratory complications will improve Outcome: Progressing Goal: Cardiovascular complication will be avoided Outcome: Progressing   Problem: Nutrition: Goal: Adequate nutrition will be maintained Outcome: Progressing   Problem: Activity: Goal: Risk for activity intolerance will decrease Outcome: Progressing   Problem: Coping: Goal: Level of anxiety will decrease Outcome: Progressing   Problem: Pain Managment: Goal: General experience of comfort will improve Outcome: Progressing   Problem: Safety: Goal: Ability to remain free from injury will improve Outcome: Progressing

## 2021-01-09 DIAGNOSIS — E781 Pure hyperglyceridemia: Secondary | ICD-10-CM

## 2021-01-09 DIAGNOSIS — K859 Acute pancreatitis without necrosis or infection, unspecified: Secondary | ICD-10-CM

## 2021-01-09 DIAGNOSIS — R000 Tachycardia, unspecified: Secondary | ICD-10-CM

## 2021-01-09 DIAGNOSIS — E1110 Type 2 diabetes mellitus with ketoacidosis without coma: Principal | ICD-10-CM

## 2021-01-09 DIAGNOSIS — R030 Elevated blood-pressure reading, without diagnosis of hypertension: Secondary | ICD-10-CM

## 2021-01-09 LAB — CBC WITH DIFFERENTIAL/PLATELET
Abs Immature Granulocytes: 0.05 10*3/uL (ref 0.00–0.07)
Basophils Absolute: 0 10*3/uL (ref 0.0–0.1)
Basophils Relative: 0 %
Eosinophils Absolute: 0.1 10*3/uL (ref 0.0–0.5)
Eosinophils Relative: 0 %
HCT: 39.8 % (ref 39.0–52.0)
Hemoglobin: 13.5 g/dL (ref 13.0–17.0)
Immature Granulocytes: 0 %
Lymphocytes Relative: 10 %
Lymphs Abs: 1.2 10*3/uL (ref 0.7–4.0)
MCH: 28.7 pg (ref 26.0–34.0)
MCHC: 33.9 g/dL (ref 30.0–36.0)
MCV: 84.5 fL (ref 80.0–100.0)
Monocytes Absolute: 0.7 10*3/uL (ref 0.1–1.0)
Monocytes Relative: 6 %
Neutro Abs: 9.8 10*3/uL — ABNORMAL HIGH (ref 1.7–7.7)
Neutrophils Relative %: 84 %
Platelets: 167 10*3/uL (ref 150–400)
RBC: 4.71 MIL/uL (ref 4.22–5.81)
RDW: 13 % (ref 11.5–15.5)
WBC: 11.7 10*3/uL — ABNORMAL HIGH (ref 4.0–10.5)
nRBC: 0 % (ref 0.0–0.2)

## 2021-01-09 LAB — BETA-HYDROXYBUTYRIC ACID
Beta-Hydroxybutyric Acid: 0.84 mmol/L — ABNORMAL HIGH (ref 0.05–0.27)
Beta-Hydroxybutyric Acid: 0.74 mmol/L — ABNORMAL HIGH (ref 0.05–0.27)

## 2021-01-09 LAB — COMPREHENSIVE METABOLIC PANEL
ALT: 18 U/L (ref 0–44)
AST: 15 U/L (ref 15–41)
Albumin: 2.3 g/dL — ABNORMAL LOW (ref 3.5–5.0)
Alkaline Phosphatase: 45 U/L (ref 38–126)
Anion gap: 10 (ref 5–15)
BUN: 6 mg/dL (ref 6–20)
CO2: 27 mmol/L (ref 22–32)
Calcium: 8.4 mg/dL — ABNORMAL LOW (ref 8.9–10.3)
Chloride: 97 mmol/L — ABNORMAL LOW (ref 98–111)
Creatinine, Ser: 0.66 mg/dL (ref 0.61–1.24)
GFR, Estimated: >60 mL/min (ref >60)
Glucose, Bld: 154 mg/dL — ABNORMAL HIGH (ref 70–99)
Potassium: 3.2 mmol/L — ABNORMAL LOW (ref 3.5–5.1)
Sodium: 134 mmol/L — ABNORMAL LOW (ref 135–145)
Total Bilirubin: 1.9 mg/dL — ABNORMAL HIGH (ref 0.3–1.2)
Total Protein: 5.4 g/dL — ABNORMAL LOW (ref 6.5–8.1)

## 2021-01-09 LAB — GLUCOSE, CAPILLARY
Glucose-Capillary: 136 mg/dL — ABNORMAL HIGH (ref 70–99)
Glucose-Capillary: 146 mg/dL — ABNORMAL HIGH (ref 70–99)
Glucose-Capillary: 151 mg/dL — ABNORMAL HIGH (ref 70–99)
Glucose-Capillary: 151 mg/dL — ABNORMAL HIGH (ref 70–99)
Glucose-Capillary: 153 mg/dL — ABNORMAL HIGH (ref 70–99)
Glucose-Capillary: 158 mg/dL — ABNORMAL HIGH (ref 70–99)
Glucose-Capillary: 160 mg/dL — ABNORMAL HIGH (ref 70–99)
Glucose-Capillary: 163 mg/dL — ABNORMAL HIGH (ref 70–99)
Glucose-Capillary: 164 mg/dL — ABNORMAL HIGH (ref 70–99)
Glucose-Capillary: 168 mg/dL — ABNORMAL HIGH (ref 70–99)
Glucose-Capillary: 173 mg/dL — ABNORMAL HIGH (ref 70–99)
Glucose-Capillary: 176 mg/dL — ABNORMAL HIGH (ref 70–99)
Glucose-Capillary: 177 mg/dL — ABNORMAL HIGH (ref 70–99)

## 2021-01-09 LAB — TRIGLYCERIDES: Triglycerides: 278 mg/dL — ABNORMAL HIGH (ref <150)

## 2021-01-09 LAB — HEMOGLOBIN A1C
Hgb A1c MFr Bld: 12.1 % — ABNORMAL HIGH (ref 4.8–5.6)
Mean Plasma Glucose: 301 mg/dL

## 2021-01-09 MED ORDER — DOCUSATE SODIUM 100 MG PO CAPS: 100.0000 mg | ORAL_CAPSULE | Freq: Every day | ORAL | Status: DC | PRN

## 2021-01-09 MED ORDER — POTASSIUM CHLORIDE CRYS ER 20 MEQ PO TBCR
40.0000 meq | EXTENDED_RELEASE_TABLET | Freq: Once | ORAL | Status: AC
  Administered 2021-01-09: 40 meq via ORAL
  Filled 2021-01-09: qty 2

## 2021-01-09 MED ORDER — DOCUSATE SODIUM 100 MG PO CAPS
100.0000 mg | ORAL_CAPSULE | Freq: Every day | ORAL | Status: DC
  Administered 2021-01-09 – 2021-01-11 (×3): 100 mg via ORAL
  Filled 2021-01-09 (×3): qty 1

## 2021-01-09 MED ORDER — POTASSIUM CHLORIDE 20 MEQ PO PACK
40.0000 meq | PACK | Freq: Once | ORAL | Status: DC
  Filled 2021-01-09: qty 2

## 2021-01-09 MED ORDER — POTASSIUM CHLORIDE 20 MEQ PO PACK
40.0000 meq | PACK | Freq: Once | ORAL | Status: AC
  Administered 2021-01-09: 40 meq via ORAL
  Filled 2021-01-09: qty 2

## 2021-01-09 NOTE — Progress Notes (Signed)
Patient transferred from ED to floor with yellow MEWS. Charge nurse aware. Will continue to monitor patient.

## 2021-01-09 NOTE — Progress Notes (Addendum)
Inpatient Diabetes Program Recommendations  AACE/ADA: New Consensus Statement on Inpatient Glycemic Control (2015)  Target Ranges:  Prepandial:   less than 140 mg/dL      Peak postprandial:   less than 180 mg/dL (1-2 hours)      Critically ill patients:  140 - 180 mg/dL   Lab Results  Component Value Date   GLUCAP 173 (H) 01/09/2021    Review of Glycemic Control  Diabetes history: New diagnosis  Current orders for Inpatient glycemic control: IV insulin  A1c pending   Inpatient Diabetes Program Recommendations:    Insulin 2.2-3.2 units/hour  -   Start lantus 30 units (0.3 units/kg) -   Novolog 0-15 units tid + hs (or Q4 if not eating)  Living well with diabetes booklet ordered. Will see pt today.  Addendum 1:44 pm:  Spoke with patient about new diabetes diagnosis.  Discussed A1C results (12.1%) and explained what an A1C is. Discussed basic pathophysiology of DM Type 1 ad type 2, basic home care, importance of checking CBGs and maintaining good CBG control to prevent long-term and short-term complications. Reviewed glucose and A1C goals.  Reviewed signs and symptoms of hyperglycemia and hypoglycemia along with treatment for both. Discussed impact of nutrition, exercise, stress, sickness, and medications on diabetes control.   Informed patient that he will be prescribed at least basal insulin. Discussed back up insulin at [facility] is available for $25 per vial $43 per box of 5 insulin pens. Provided patient with handout information on [employer] products. Discussed basal insulin and short acting insulin. Discussed when they are given.  Asked patient to check his glucose 4 times per day (before meals and at bedtime) and to keep a log book of glucose readings and insulin taken. Explained how the doctor he follows up with can use the log book to continue to make insulin adjustments if needed.   Reviewed and demonstrated how to draw up and administer insulin with vial and syringe and insulin  pen. Patient verbalized understanding of information discussed and he states that he has no further questions at this time related to diabetes.   RNs to provide ongoing basic DM education at bedside with this patient and engage patient to actively check blood glucose and administer insulin injections.   Dietitian spoke with pt in room regarding carbohydrate counting. 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  Gave pt glucometer from [employer],  Christena Deem RN, MSN, BC-ADM Inpatient Diabetes Coordinator Team Pager [phone] (8a-5p)

## 2021-01-09 NOTE — TOC Initial Note (Signed)
Transition of Care [facility]) - Initial/Assessment Note    Patient Details  Name: George Howe MRN: [id number] Date of Birth: 19-Aug-1986  Transition of Care [facility]) CM/SW Contact:    Durenda Guthrie, RN Phone Number: 01/09/2021, 11:46 AM  Clinical Narrative:   CM has arranged hospital followup appointment for patient at the [facility]. Appt is for Wednesday, Feb 01, 2021 @ 1:30pm.  This will be placed on AVS for patient..   Expected Discharge Plan: Home/Self Care Barriers to Discharge: No Barriers Identified   Patient Goals and CMS Choice     Choice offered to / list presented to : NA  Expected Discharge Plan and Services Expected Discharge Plan: Home/Self Care In-house Referral: NA Discharge Planning Services: CM Consult,Follow-up appt scheduled Post Acute Care Choice: NA                   DME Arranged: N/A DME Agency: NA       HH Arranged: NA          Prior Living Arrangements/Services                       Activities of Daily Living Home Assistive Devices/Equipment: None ADL Screening (condition at time of admission) Patient's cognitive ability adequate to safely complete daily activities?: Yes Is the patient deaf or have difficulty hearing?: No Does the patient have difficulty seeing, even when wearing glasses/contacts?: No Does the patient have difficulty concentrating, remembering, or making decisions?: No Patient able to express need for assistance with ADLs?: No Does the patient have difficulty dressing or bathing?: No Independently performs ADLs?: Yes (appropriate for developmental age) Does the patient have difficulty walking or climbing stairs?: No Weakness of Legs: None Weakness of Arms/Hands: None  Permission Sought/Granted                  Emotional Assessment              Admission diagnosis:  Diabetic ketoacidosis (HCC) [E11.10] Acute pancreatitis, unspecified complication status,  unspecified pancreatitis type [K85.90] Patient Active Problem List   Diagnosis Date Noted  . Diabetic ketoacidosis (HCC) 01/07/2021   PCP:  Default, Provider, MD Pharmacy:   [facility] [id number] - [place], [place] - [address] AT [facility] [address] [facility] [address] [place] [address] Phone: [phone] Fax: [phone]     Social Determinants of Health (SDOH) Interventions    Readmission Risk Interventions No flowsheet data found.

## 2021-01-09 NOTE — Progress Notes (Signed)
Nutrition Education Note  RD consulted for nutrition education regarding new onset diabetes. Patient reports that he eats a healthy diet including 5 meals/snacks per day. He is motivated to make dietary changes as needed, but seems overwhelmed with all the information he has been given.   Lab Results  Component Value Date   HGBA1C 12.1 (H) 01/07/2021    RD provided "Carbohydrate Counting for People with Diabetes" handout from the Academy of Nutrition and Dietetics. Discussed different food groups and their effects on blood sugar, emphasizing carbohydrate-containing foods. Provided list of carbohydrates and recommended serving sizes of common foods.  Discussed importance of controlled and consistent carbohydrate intake throughout the day. Provided examples of ways to balance meals/snacks and encouraged intake of high-fiber, whole grain complex carbohydrates. Teach back method used.  Expect good compliance.  Body mass index is 34.08 kg/m. Pt meets criteria for obesity based on current BMI.  Current diet order is CHO modified, patient is consuming approximately 50% of meals at this time. Labs and medications reviewed. No further nutrition interventions warranted at this time. RD contact information provided. If additional nutrition issues arise, please re-consult RD.  Gabriel Rainwater, RD, LDN, CNSC Please refer to [facility] for contact information.

## 2021-01-09 NOTE — Progress Notes (Addendum)
   Subjective:   Mr. Heeter states he was able to pass a bowel movement overnight and passed gas as well. He did eat half his dinner but had a decent amount of nausea with it. He is having soreness in his abdomen. He has been up and walking around.   ADDENDUM: He continues to not be tolerant of p.o. intake with full diet so will transition to clear liquids only and assess how he tolerates this.  Objective:  Vital signs in last 24 hours: Vitals:   01/08/21 1605 01/08/21 1951 01/08/21 2346 01/09/21 0330  BP: 129/84 (!) 146/90 (!) 141/94 (!) 141/84  Pulse:  (!) 107 (!) 107 (!) 104  Resp: 20 18 20 19   Temp: 98.6 F (37 C) 100.3 F (37.9 C) 98.4 F (36.9 C) 98.6 F (37 C)  TempSrc: Oral Oral Oral Oral  SpO2:  95% 93% 93%  Weight:      Height:       General: Obese male, lying in bed, NAD. Cardiovascular: Tachycardic rate and normal rhythm, no murmurs rubs or gallops. Pulmonary: Clear to auscultation bilaterally, normal respiratory effort. Abdomen: Soft, nondistended, mildly tender to palpation in epigastric region, hypoactive bowel sounds. Skin: Warm and dry. Neurological: AAOx3, no focal deficits.  Assessment/Plan:  Active Problems:   Diabetic ketoacidosis (HCC)  DKA He was able to eat half of his dinner yesterday night but did become nauseous with it.  Discussed that should he continue to feel nausea or develop any abdominal pain with eating meals to notify so that we can scale back on his diet to allow for some more bowel rest.  Otherwise he is doing better and has been able to ambulate.  He will require diabetic medications at discharge with outpatient follow-up with PCP to ensure continued glycemic control and prevention of further episodes of DKA.  He wishes to establish care at [facility] after discharge so we will schedule follow-up once he is medically stable.  His hemoglobin A1c came back at 12.1, so it appears he has uncontrolled diabetes at least for the last 3 months which  likely precipitated his DKA and possibly his hypertriglyceridemia leading to acute pancreatitis. - CBGs have remained less than 180.   - Beta hydroxybutyrate slowly trending up, will continue to monitor. - Hemoglobin A1c 12.1% - Autoantibodies pending to check for type 1 diabetes - Continue EndoTool until patient is able to tolerate p.o. intake fully - D5-LR at 125 cc/hr - IV Dilaudid for pain control - Daily BMP  Acute Pancreatitis 2/2 hypertriglyceridemia Patient's triglycerides initially 1,650.  Triglycerides 270 today.  Although hypertriglyceridemia is improved, he will require additional triglyceride-lowering therapy at discharge. -will need statin at discharge -can consider addition of fibrates as outpatient, but unsure if patient will be able to tolerate both medications simultaneously  Elevated BP Sinus tachycardia No history of hypertension. Could be from pain secondary to above.  Will monitor blood pressures.  Tachycardia seems to be improving, now down to low 100s.   Prior to Admission Living Arrangement: Home Anticipated Discharge Location: TBD Barriers to Discharge: continued medical management Dispo: Anticipated discharge in approximately 2-3 day(s).   [facility], MD 01/09/2021, 6:42 AM Pager: [phone] After 5pm on weekdays and 1pm on weekends: On Call pager [phone]

## 2021-01-10 ENCOUNTER — Other Ambulatory Visit: Payer: Self-pay

## 2021-01-10 LAB — GLUCOSE, CAPILLARY
Glucose-Capillary: 143 mg/dL — ABNORMAL HIGH (ref 70–99)
Glucose-Capillary: 149 mg/dL — ABNORMAL HIGH (ref 70–99)
Glucose-Capillary: 150 mg/dL — ABNORMAL HIGH (ref 70–99)
Glucose-Capillary: 159 mg/dL — ABNORMAL HIGH (ref 70–99)
Glucose-Capillary: 163 mg/dL — ABNORMAL HIGH (ref 70–99)
Glucose-Capillary: 167 mg/dL — ABNORMAL HIGH (ref 70–99)
Glucose-Capillary: 171 mg/dL — ABNORMAL HIGH (ref 70–99)
Glucose-Capillary: 171 mg/dL — ABNORMAL HIGH (ref 70–99)
Glucose-Capillary: 174 mg/dL — ABNORMAL HIGH (ref 70–99)
Glucose-Capillary: 174 mg/dL — ABNORMAL HIGH (ref 70–99)
Glucose-Capillary: 177 mg/dL — ABNORMAL HIGH (ref 70–99)
Glucose-Capillary: 180 mg/dL — ABNORMAL HIGH (ref 70–99)
Glucose-Capillary: 190 mg/dL — ABNORMAL HIGH (ref 70–99)
Glucose-Capillary: 190 mg/dL — ABNORMAL HIGH (ref 70–99)
Glucose-Capillary: 218 mg/dL — ABNORMAL HIGH (ref 70–99)

## 2021-01-10 LAB — CBC WITH DIFFERENTIAL/PLATELET
Abs Immature Granulocytes: 0.06 10*3/uL (ref 0.00–0.07)
Basophils Absolute: 0 10*3/uL (ref 0.0–0.1)
Basophils Relative: 0 %
Eosinophils Absolute: 0 10*3/uL (ref 0.0–0.5)
Eosinophils Relative: 0 %
HCT: 38.1 % — ABNORMAL LOW (ref 39.0–52.0)
Hemoglobin: 12.9 g/dL — ABNORMAL LOW (ref 13.0–17.0)
Immature Granulocytes: 1 %
Lymphocytes Relative: 9 %
Lymphs Abs: 1.1 10*3/uL (ref 0.7–4.0)
MCH: 29 pg (ref 26.0–34.0)
MCHC: 33.9 g/dL (ref 30.0–36.0)
MCV: 85.6 fL (ref 80.0–100.0)
Monocytes Absolute: 0.8 10*3/uL (ref 0.1–1.0)
Monocytes Relative: 7 %
Neutro Abs: 9.8 10*3/uL — ABNORMAL HIGH (ref 1.7–7.7)
Neutrophils Relative %: 83 %
Platelets: 182 10*3/uL (ref 150–400)
RBC: 4.45 MIL/uL (ref 4.22–5.81)
RDW: 12.9 % (ref 11.5–15.5)
WBC: 11.8 10*3/uL — ABNORMAL HIGH (ref 4.0–10.5)
nRBC: 0 % (ref 0.0–0.2)

## 2021-01-10 LAB — BASIC METABOLIC PANEL
Anion gap: 8 (ref 5–15)
BUN: <5 mg/dL — ABNORMAL LOW (ref 6–20)
CO2: 28 mmol/L (ref 22–32)
Calcium: 8.5 mg/dL — ABNORMAL LOW (ref 8.9–10.3)
Chloride: 96 mmol/L — ABNORMAL LOW (ref 98–111)
Creatinine, Ser: 0.58 mg/dL — ABNORMAL LOW (ref 0.61–1.24)
GFR, Estimated: >60 mL/min (ref >60)
Glucose, Bld: 172 mg/dL — ABNORMAL HIGH (ref 70–99)
Potassium: 3 mmol/L — ABNORMAL LOW (ref 3.5–5.1)
Sodium: 132 mmol/L — ABNORMAL LOW (ref 135–145)

## 2021-01-10 MED ORDER — INSULIN ASPART 100 UNIT/ML ~~LOC~~ SOLN
4.0000 [IU] | Freq: Three times a day (TID) | SUBCUTANEOUS | Status: DC
  Administered 2021-01-10 – 2021-01-11 (×2): 4 [IU] via SUBCUTANEOUS

## 2021-01-10 MED ORDER — POTASSIUM CHLORIDE CRYS ER 20 MEQ PO TBCR
40.0000 meq | EXTENDED_RELEASE_TABLET | Freq: Two times a day (BID) | ORAL | Status: DC
  Administered 2021-01-10 – 2021-01-11 (×3): 40 meq via ORAL
  Filled 2021-01-10 (×3): qty 2

## 2021-01-10 MED ORDER — KCL-LACTATED RINGERS-D5W 20 MEQ/L IV SOLN
INTRAVENOUS | Status: DC
  Filled 2021-01-10: qty 1000

## 2021-01-10 MED ORDER — POTASSIUM CHLORIDE 10 MEQ/100ML IV SOLN
10.0000 meq | INTRAVENOUS | Status: AC
  Administered 2021-01-10 (×4): 10 meq via INTRAVENOUS
  Filled 2021-01-10 (×4): qty 100

## 2021-01-10 MED ORDER — INSULIN GLARGINE 100 UNIT/ML ~~LOC~~ SOLN: 30.0000 [IU] | Freq: Every day | SUBCUTANEOUS | Status: DC

## 2021-01-10 MED ORDER — CYCLOBENZAPRINE HCL 10 MG PO TABS
10.0000 mg | ORAL_TABLET | Freq: Three times a day (TID) | ORAL | Status: DC | PRN
  Administered 2021-01-10 – 2021-01-11 (×3): 10 mg via ORAL
  Filled 2021-01-10 (×3): qty 1

## 2021-01-10 MED ORDER — INSULIN GLARGINE 100 UNIT/ML ~~LOC~~ SOLN
30.0000 [IU] | Freq: Every day | SUBCUTANEOUS | Status: DC
  Administered 2021-01-10 – 2021-01-11 (×2): 30 [IU] via SUBCUTANEOUS
  Filled 2021-01-10 (×2): qty 0.3

## 2021-01-10 MED ORDER — INSULIN ASPART 100 UNIT/ML ~~LOC~~ SOLN
0.0000 [IU] | Freq: Three times a day (TID) | SUBCUTANEOUS | Status: DC
  Administered 2021-01-10: 2 [IU] via SUBCUTANEOUS
  Administered 2021-01-11: 3 [IU] via SUBCUTANEOUS
  Administered 2021-01-11: 5 [IU] via SUBCUTANEOUS

## 2021-01-10 MED ORDER — INSULIN ASPART 100 UNIT/ML ~~LOC~~ SOLN: 0.0000 [IU] | Freq: Every day | SUBCUTANEOUS | Status: DC

## 2021-01-10 NOTE — Progress Notes (Incomplete)
   Subjective: George Howe was able to eat this morning without nausea. Doesn't have a normal appetite yet. Complains of back spasms from being in bed more than he is used to.  We advised him on using dilaudid sparingly for abdominal pain. We will try a different muscle relaxant to help his back.  Plan to try lunch today and if successful, can transition off insulin drip.   Objective:  Vital signs in last 24 hours: Vitals:   01/09/21 2026 01/09/21 2345 01/10/21 0411 01/10/21 0837  BP: (!) 134/98 (!) 152/97 (!) 140/91 (!) 149/101  Pulse: (!) 103 (!) 106 98 (!) 103  Resp: 20 20 18    Temp: 98.6 F (37 C) 98.3 F (36.8 C) 98.7 F (37.1 C) 98.6 F (37 C)  TempSrc: Oral Oral Oral Oral  SpO2: 94% 96% 91% 96%  Weight:      Height:       ***  Assessment/Plan:  Active Problems:   Diabetic ketoacidosis (HCC)   Acute pancreatitis  *** Prior to Admission Living Arrangement: Anticipated Discharge Location: Barriers to Discharge: Dispo: Anticipated discharge in approximately *** day(s).   D, DO 01/10/2021, 11:07 AM Pager: [phone] After 5pm on weekdays and 1pm on weekends: On Call pager [phone]

## 2021-01-10 NOTE — Progress Notes (Signed)
Inpatient Diabetes Program Recommendations  AACE/ADA: New Consensus Statement on Inpatient Glycemic Control   Target Ranges:  Prepandial:   less than 140 mg/dL      Peak postprandial:   less than 180 mg/dL (1-2 hours)      Critically ill patients:  140 - 180 mg/dL   Results for DONA, KLEMANN (MRN [id number]) as of 01/10/2021 09:58  Ref. Range 01/10/2021 00:35 01/10/2021 02:36 01/10/2021 04:14 01/10/2021 06:29 01/10/2021 08:35  Glucose-Capillary Latest Ref Range: 70 - 99 mg/dL 697 (H) 948 (H) 016 (H) 143 (H) 167 (H)   Review of Glycemic Control  Diabetes history: No Outpatient Diabetes medications: NA Current orders for Inpatient glycemic control: IV insulin  Inpatient Diabetes Program Recommendations:    Insulin: Once provider is ready to transition from IV to SQ insulin, please consider ordering Lantus 30 units Q24H, CBGs Q4H, Novolog 0-15 units Q4H.  NOTE: Patient was seen by inpatient diabetes coordinator on 01/09/21 (see note for details). Patient was also educated on insulin as well.   Thanks, Orlando Penner, RN, MSN, CDE Diabetes Coordinator Inpatient Diabetes Program [phone] (Team Pager from 8am to 5pm)

## 2021-01-10 NOTE — Progress Notes (Signed)
   Subjective:   Mr. Treanor was able to eat this morning without nausea.  Does not have a normal appetite yet.  Complains of back spasms from being in bed more than he is used to. Advised him on using Dilaudid sparingly for abdominal pain.  We will try a different muscle relaxant to help his back.  Plan to try lunch today and if successful, can transition off insulin drip.  He agrees with plan.  Objective:  Vital signs in last 24 hours: Vitals:   01/09/21 2345 01/10/21 0411 01/10/21 0837 01/10/21 1144  BP: (!) 152/97 (!) 140/91 (!) 149/101 (!) 152/111  Pulse: (!) 106 98 (!) 103 96  Resp: 20 18    Temp: 98.3 F (36.8 C) 98.7 F (37.1 C) 98.6 F (37 C) 98.6 F (37 C)  TempSrc: Oral Oral Oral Oral  SpO2: 96% 91% 96% 94%  Weight:      Height:       General: Obese male, lying in bed, NAD. Cardiovascular: Regular rate and normal rhythm, normal respiratory effort. Abdomen: Soft, nondistended, nontender.  Slightly hypoactive bowel sounds. Skin: Warm and dry. Neurological: AAO x3, no focal deficits   Assessment/Plan:  Active Problems:   Diabetic ketoacidosis (HCC)   Acute pancreatitis  DKA He is feeling a lot better today and was able to tolerate clear liquid diet yesterday night.  He was able to also have some pot roast for lunch after we advance his diet this morning.  He is also been able to ambulate without any difficulties.  His BMPs continue to show closed anion gap and his CBGs remain less than 180.  Will discontinue insulin infusion and begin basal bolus subcutaneous insulin. -Carb modified diet - Lantus 30 units daily - 4 units NovoLog subcutaneous 3 times daily with meals - Moderate sliding scale with meals and at bedtime scale - Autoantibodies pending to check for type 1 diabetes - Continue D5-LR at 125 cc/hr - IV Dilaudid for pain control and added Flexeril for back spasms - Daily BMP  Acute Pancreatitis 2/2 hypertriglyceridemia Although hypertriglyceridemia is  improved, he will require additional triglyceride-lowering therapy at discharge. -will need statin at discharge -can consider addition of fibrates as outpatient, but unsure if patient will be able to tolerate both medications simultaneously  Hypokalemia Potassium 3.2 yesterday, given 80 mEq orally.  Potassium 3.0 today, likely due to insulin infusion.  Will receive 80 mEq orally and 4 runs of IV potassium today.  Recheck BMP tomorrow a.m.  Elevated BP Sinus tachycardia No history of hypertension. Could be from pain secondary to above.  Will monitor blood pressures.  Tachycardia seems to be improving, now down to low 100s.   Prior to Admission Living Arrangement: Home Anticipated Discharge Location: TBD Barriers to Discharge: continued medical management Dispo: Anticipated discharge in approximately 2-3 day(s).   Merrilyn Puma, MD 01/10/2021, 3:00 PM Pager: [phone] After 5pm on weekdays and 1pm on weekends: On Call pager [phone]

## 2021-01-11 ENCOUNTER — Other Ambulatory Visit (HOSPITAL_COMMUNITY): Payer: Self-pay

## 2021-01-11 ENCOUNTER — Other Ambulatory Visit: Payer: Self-pay | Admitting: Internal Medicine

## 2021-01-11 DIAGNOSIS — E781 Pure hyperglyceridemia: Secondary | ICD-10-CM

## 2021-01-11 LAB — LIPID PANEL
Cholesterol: 213 mg/dL — ABNORMAL HIGH (ref 0–200)
HDL: 27 mg/dL — ABNORMAL LOW (ref >40)
LDL Cholesterol: 155 mg/dL — ABNORMAL HIGH (ref 0–99)
Total CHOL/HDL Ratio: 7.9 RATIO
Triglycerides: 156 mg/dL — ABNORMAL HIGH (ref <150)
VLDL: 31 mg/dL (ref 0–40)

## 2021-01-11 LAB — BASIC METABOLIC PANEL
Anion gap: 11 (ref 5–15)
BUN: <5 mg/dL — ABNORMAL LOW (ref 6–20)
CO2: 24 mmol/L (ref 22–32)
Calcium: 8.8 mg/dL — ABNORMAL LOW (ref 8.9–10.3)
Chloride: 100 mmol/L (ref 98–111)
Creatinine, Ser: 0.61 mg/dL (ref 0.61–1.24)
GFR, Estimated: >60 mL/min (ref >60)
Glucose, Bld: 184 mg/dL — ABNORMAL HIGH (ref 70–99)
Potassium: 3.6 mmol/L (ref 3.5–5.1)
Sodium: 135 mmol/L (ref 135–145)

## 2021-01-11 LAB — GLUTAMIC ACID DECARBOXYLASE AUTO ABS: Glutamic Acid Decarb Ab: <5 U/mL (ref 0.0–5.0)

## 2021-01-11 LAB — ANTI-ISLET CELL ANTIBODY: Pancreatic Islet Cell Antibody: NEGATIVE

## 2021-01-11 LAB — GLUCOSE, CAPILLARY
Glucose-Capillary: 166 mg/dL — ABNORMAL HIGH (ref 70–99)
Glucose-Capillary: 203 mg/dL — ABNORMAL HIGH (ref 70–99)

## 2021-01-11 MED ORDER — OXYCODONE HCL 5 MG PO TABS
5.0000 mg | ORAL_TABLET | Freq: Four times a day (QID) | ORAL | 0 refills | Status: AC | PRN
  Filled 2021-01-11: qty 12, 3d supply, fill #0

## 2021-01-11 MED ORDER — HYDROMORPHONE HCL 1 MG/ML IJ SOLN: 0.5000 mg | INTRAMUSCULAR | Status: DC | PRN

## 2021-01-11 MED ORDER — ACETAMINOPHEN 500 MG PO TABS
1000.0000 mg | ORAL_TABLET | Freq: Three times a day (TID) | ORAL | Status: DC
  Administered 2021-01-11: 1000 mg via ORAL
  Filled 2021-01-11: qty 2

## 2021-01-11 MED ORDER — LACTATED RINGERS IV BOLUS
1000.0000 mL | Freq: Once | INTRAVENOUS | Status: AC
  Administered 2021-01-11: 1000 mL via INTRAVENOUS

## 2021-01-11 MED ORDER — ATORVASTATIN CALCIUM 40 MG PO TABS
40.0000 mg | ORAL_TABLET | Freq: Every day | ORAL | 2 refills | Status: DC
  Filled 2021-01-11: qty 30, 30d supply, fill #0

## 2021-01-11 MED ORDER — OXYCODONE HCL 5 MG PO TABS
5.0000 mg | ORAL_TABLET | ORAL | Status: DC | PRN
Start: 2021-01-11 — End: 2021-01-11
  Administered 2021-01-11: 5 mg via ORAL
  Filled 2021-01-11: qty 1

## 2021-01-11 MED ORDER — INSULIN GLARGINE 100 UNIT/ML SOLOSTAR PEN
30.0000 [IU] | PEN_INJECTOR | Freq: Every day | SUBCUTANEOUS | 0 refills | Status: DC
  Filled 2021-01-11: qty 9, 30d supply, fill #0

## 2021-01-11 MED ORDER — PEN NEEDLES 32G X 4 MM MISC
11 refills | Status: DC
  Filled 2021-01-11: qty 100, 25d supply, fill #0
  Filled 2021-01-24: qty 100, 25d supply, fill #1
  Filled 2021-02-15: qty 100, 25d supply, fill #2
  Filled 2021-03-06: qty 100, 25d supply, fill #3
  Filled 2021-07-12: qty 100, 25d supply, fill #4
  Filled 2021-10-10: qty 100, 25d supply, fill #5
  Filled 2021-11-17: qty 100, 25d supply, fill #6

## 2021-01-11 MED ORDER — OXYCODONE HCL 5 MG PO TABS: 5.0000 mg | ORAL_TABLET | Freq: Four times a day (QID) | ORAL | Status: DC | PRN

## 2021-01-11 MED ORDER — INSULIN ASPART 100 UNIT/ML FLEXPEN
4.0000 [IU] | PEN_INJECTOR | Freq: Three times a day (TID) | SUBCUTANEOUS | 0 refills | Status: DC
  Filled 2021-01-11: qty 3, 25d supply, fill #0

## 2021-01-11 NOTE — Progress Notes (Signed)
Patient was discharged from 5w. Patient has all belongings, and will pick up medications from [facility]. I have personally reviewed his AVS with him as well as insulin pen injections and the importance of not taking insulin if skipping a meal. Patient has no further questions at this time and states that he feels comfortable checking own blood sugar and administering insulin with his pens. IV d/c'd, skin intact, VS stable. Patient will walk and meet his ride at the [employer].

## 2021-01-11 NOTE — Progress Notes (Signed)
Provided education and assistance with self-injection for insulin and self-glucose checks. Patient checked glucose level, determined that it was high (203). I drew up the correct amount of insulin from the vial and taught the patient how to administer to himself. He stated that he should rotate injection sites to avoid developing scar tissue, which lowers the actual amount of insulin he would be getting. All questions have been answered at this time. I will continue allowing the patient to obtain own CBG with our machine, and administering own insulin.

## 2021-01-11 NOTE — Progress Notes (Addendum)
Inpatient Diabetes Program Recommendations  AACE/ADA: New Consensus Statement on Inpatient Glycemic Control   Target Ranges:  Prepandial:   less than 140 mg/dL      Peak postprandial:   less than 180 mg/dL (1-2 hours)      Critically ill patients:  140 - 180 mg/dL   Results for George Howe, George Howe (MRN [id number]) as of 01/11/2021 10:39  Ref. Range 01/10/2021 10:36 01/10/2021 11:34 01/10/2021 12:54 01/10/2021 13:54 01/10/2021 14:53 01/10/2021 15:26 01/10/2021 16:34 01/10/2021 17:33 01/10/2021 17:50 01/10/2021 20:54 01/11/2021 07:52  Glucose-Capillary Latest Ref Range: 70 - 99 mg/dL 086 (H) 578 (H) 469 (H) 218 (H) 190 (H) 180 (H)   Lantus 30 units @15 :49  174 (H) 150 (H) 149 (H)  Novolog 6 units   163 (H) 166 (H)  Novolog 3 units   Review of Glycemic Control  Diabetes history: No Outpatient Diabetes medications: NA Current orders for Inpatient glycemic control: Lantus 30 units daily, Novolog 0-15 units TID with meals, Novolog 0-5 units QHS, Novolog 4 units TID with meals  Inpatient Diabetes Program Recommendations:    Insulin: Since patient has no insurance may want to prescribe more affordable insulin at discharge. If so, would recommend 70/30 21 units BID (dose will provide a total of 29.4 units for basal and 12.6 units for meal coverage per day).  Addendum 01/11/21@13 :00- Our team received page from Dr. 01/13/21 today regarding patient having questions about glucose monitoring and insulin injections. Communicated with [name], RN regarding glucose monitoring and insulin administration. RN worked with patient on both glucose monitoring and insulin administration and documented education and patient's ability to check his own glucose and self inject insulin. Diabetes coordinator working remotely today. Called patient and spoke with him over the phone regarding glucose monitoring and insulin administration. Patient states that he has check glucose twice today with hospital glucometer and has given  himself insulin injection today. Patient states he feels comfortable with both. Patient had questions about insulins, how to store, how each insulin works, and how to determine dose. Discussed insulin as currently ordered (basal, meal coverage, and correction) and how each insulin works. Patient asked specifically about using a correction scale. Discussed current Novolog correction scale ordered and informed patient that I would ask RN to show him the scale next time it is time to check glucose and administer insulin.  Discussed that due to cost, it would be recommended to use more affordable insulin (Novolin N, R, and/or 70/30) which are $25 per vial or $43 per  Box of 5 insulin pens at [facility]. Patient states that he is open to using whichever he needs. Patient would prefer to use insulin pens as an outpatient. Patient asked about tests to determine Type 1 versus Type 2 DM. Informed patient that c-peptide and GAD antibodies are still in process. Encouraged patient to check MyChart if not resulted before he is discharged from the hospital. Discussed importance of monitoring glucose and being sure to take glucometer or glucose log with him to appointments. Explained that provider will use the information to continue to make adjustments if needed. 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 Discussed that if he is very busy and active, it would use more glucose and potential put him at risk for hypoglycemia. Discussed hypoglycemia along with treatment and encouraged patient to get glucose tablets to have on hand at all times to use to treat hypoglycemia. Asked patient to be sure to let provider know if he has any  issues with hypoglycemia as insulin may need to be decreased.  Patient verbalized understanding of information and states that he has no other questions at this time.   Thanks, Orlando Penner, RN, MSN, CDE Diabetes Coordinator Inpatient Diabetes Program [phone] (Team Pager  from 8am to 5pm)

## 2021-01-11 NOTE — Progress Notes (Signed)
[facility] appt cancelled per MD request; pt will follow up with IM.   Oriel Ojo LCSW

## 2021-01-11 NOTE — Discharge Summary (Signed)
Name: George Howe MRN: [id number] DOB: [birth year] 35 y.o. PCP: Default, Provider, MD  Date of Admission: 01/07/2021 11:13 AM Date of Discharge:  01/11/2021 Attending Physician: Reymundo Poll, MD  Discharge Diagnosis: 1. Diabetic Ketoacidosis  2. Acute Pancreatitis 2/2 hypertriglyceridemia  Discharge Medications: Allergies as of 01/11/2021   No Known Allergies     Medication List    STOP taking these medications   ondansetron 4 MG tablet Commonly known as: ZOFRAN     TAKE these medications   atorvastatin 40 MG tablet Commonly known as: Lipitor Take 1 tablet (40 mg total) by mouth daily.   famotidine 10 MG tablet Commonly known as: PEPCID Take 10 mg by mouth once. This was given by a friend   insulin aspart 100 UNIT/ML FlexPen Commonly known as: NOVOLOG Inject 4 Units into the skin 3 (three) times daily with meals.   insulin glargine 100 UNIT/ML Solostar Pen Commonly known as: LANTUS Inject 30 Units into the skin at bedtime.   multivitamin with minerals tablet Take 1 tablet by mouth daily.   oxyCODONE 5 MG immediate release tablet Commonly known as: Oxy IR/ROXICODONE Take 1 tablet (5 mg total) by mouth every 6 (six) hours as needed for up to 3 days for severe pain.       Disposition and follow-up:   Mr.George Howe was discharged from [facility] in Stable condition.  At the hospital follow up visit please address:  1.  DKA. Undiagnosed diabetes. Type 1 diabetes workup pending (autoantibodies, c-peptide). Will discharge with lantus 30 units qhs and novolog 4 units TID with meals.  We will follow-up in [facility] in 1 week for repeat BMP, ongoing diabetes education and medication management, and to establish care.  2. Acute pancreatitis 2/2 hypertriglyceridemia.  Unsure if patient has familial hypertriglyceridemia.  Recent lipid panel showing total cholesterol 213, triglycerides 156 and LDL 155.  Will discharge with atorvastatin 40mg  daily.   Will discharge with 3-day course of as needed oxycodone for pain control.  2.  Labs / imaging needed at time of follow-up: BMP  3.  Pending labs/ test needing follow-up: C-peptide, glutamic acid decarboxylase auto ab, anti-islet cell antibody  Follow-up Appointments: -f/u in 1 [facility] with [facility]   Hospital Course by problem list: 1. Diabetic Ketoacidosis.  Patient with no personal history of diabetes but significant paternal family history of diabetes.  Labs on admission consistent with diabetic ketoacidosis and he was started on treatment per DKA protocol.  Repeat BMP showing sodium 128 potassium 4.4, bicarb 17, glucose 338, anion gap 16.  CT abdomen pelvis with contrast with findings consistent with acute pancreatitis, but no evidence of pancreatic pseudocyst or pancreatic necrosis.  His anion gap closed after 1 day.  However he remained on Endo tool and was receiving IV fluid rehydration to obtain bowel rest for acute pancreatitis see below.  Nausea was controlled with IV Zofran pain control with IV Dilaudid initially.  Hemoglobin A1c came back at 12.1% indicating uncontrolled diabetes at least for the past 3 months.  He was eventually able to start tolerating p.o. intake and was transitioned to basal bolus insulin dosing with Lantus 30 units and NovoLog 4 units 3 times daily with meals along with moderate sliding scale insulin.  He was also transitioned off of IV fluids and was tolerating p.o. intake appropriately.  Will discharge today with Lantus 30 units nightly, NovoLog 4 units 3 times daily with meals.  He will follow-up in internal medicine clinic within 1 week  for repeat BMP, optimization of diabetic medications, and establishing care.  2. Acute Pancreatitis 2/2 hypertriglyceridemia.  No history of recent trauma or gallstones or alcohol use.  Initial lipid panel showing triglycerides at 1651.  CT abdomen pelvis showing findings consistent with acute pancreatitis.  Patient's acute pancreatitis  likely secondary to high triglycerides.  He received aggressive fluid rehydration, insulin infusion per Endo tool, remain n.p.o., pain control with Dilaudid.  Repeat triglycerides significantly lower and repeat lipid panel showing triglycerides at 156 and total cholesterol at 213.  Will discharge with atorvastatin 40 mg daily for continued control of hypercholesterolemia and to prevent recurrence of acute pancreatitis from hypertriglyceridemia.  Will discharge with 3-day course of as needed oxycodone for pain control within the acute window.  Discharge Exam:   BP (!) 143/101 (BP Location: Right Arm)   Pulse (!) 107   Temp 98.4 F (36.9 C) (Oral)   Resp 18   Ht 5\' 7"  (1.702 m)   Wt 98.7 kg   SpO2 96%   BMI 34.08 kg/m  Discharge exam:  General: Obese male, lying in bed, NAD. Cardiovascular: Regular rate and normal rhythm, normal respiratory effort. Pulmonary: Clear to auscultation bilaterally, no adventitious sounds noted. Abdomen: Soft, nondistended, nontender.  Normoactive bowel sounds. Skin: Warm and dry. Neurological: AAO x3, no focal deficits.  Pertinent Labs, Studies, and Procedures:  CBC Latest Ref Rng & Units 01/10/2021 01/09/2021 01/08/2021  WBC 4.0 - 10.5 K/uL 11.8(H) 11.7(H) 14.9(H)  Hemoglobin 13.0 - 17.0 g/dL 12.9(L) 13.5 16.1  Hematocrit 39.0 - 52.0 % 38.1(L) 39.8 45.3  Platelets 150 - 400 K/uL 182 167 198   CMP Latest Ref Rng & Units 01/11/2021 01/10/2021 01/09/2021  Glucose 70 - 99 mg/dL 01/11/2021) 448(J) 856(D)  BUN 6 - 20 mg/dL 149(F) <0(Y) 6  Creatinine 0.61 - 1.24 mg/dL <6(V 7.85) 8.85(O  Sodium 135 - 145 mmol/L 135 132(L) 134(L)  Potassium 3.5 - 5.1 mmol/L 3.6 3.0(L) 3.2(L)  Chloride 98 - 111 mmol/L 100 96(L) 97(L)  CO2 22 - 32 mmol/L 24 28 27   Calcium 8.9 - 10.3 mg/dL 2.77) ) 4.1(O)  Total Protein 6.5 - 8.1 g/dL - - 5.4(L)  Total Bilirubin 0.3 - 1.2 mg/dL - - 1.9(H)  Alkaline Phos 38 - 126 U/L - - 45  AST 15 - 41 U/L - - 15  ALT 0 - 44 U/L - - 18   Lipid  Panel     Component Value Date/Time   CHOL 213 (H) 01/11/2021 1044   TRIG 156 (H) 01/11/2021 1044   HDL 27 (L) 01/11/2021 1044   CHOLHDL 7.9 01/11/2021 1044   VLDL 31 01/11/2021 1044   LDLCALC 155 (H) 01/11/2021 1044   Beta-Hydroxybutyric Acid 0.05 - 0.27 mmol/L 0.74High  0.84High CM  0.78High CM  0.66High CM  0.44High CM  4.67High CM     Component Ref Range & Units 12:03  (01/11/21) 07:52  (01/11/21) 1 d ago  (01/10/21) 1 d ago  (01/10/21) 1 d ago  (01/10/21) 1 d ago  (01/10/21) 1 d ago  (01/10/21)  Glucose-Capillary 70 - 99 mg/dL 01/12/21  01/12/21 CM  720NOBS CM  149High CM  150High CM  174High CM  180High     CT Abdomen Pelvis W Contrast  Result Date: 01/07/2021 CLINICAL DATA:  Abdominal distension since yesterday. EXAM: CT ABDOMEN AND PELVIS WITH CONTRAST TECHNIQUE: Multidetector CT imaging of the abdomen and pelvis was performed using the standard protocol following bolus administration of intravenous contrast. CONTRAST:  29mL  OMNIPAQUE IOHEXOL 300 MG/ML  SOLN COMPARISON:  None. FINDINGS: Lower chest: No acute abnormality. Hepatobiliary: Diffuse low density of liver is identified. No focal liver lesion is noted. The gallbladder is normal. There is a tree is normal. Pancreas: There is marked stranding and fluid surrounding the pancreas. No pancreatic pseudocyst is identified. There is no evidence of pancreatic necrosis. Spleen: Normal in size without focal abnormality. Adrenals/Urinary Tract: Adrenal glands are unremarkable. Kidneys are normal, without renal calculi, focal lesion, or hydronephrosis. Bladder is unremarkable. Stomach/Bowel: Stomach is within normal limits. Appendix appears normal. No evidence of bowel wall thickening, distention, or inflammatory changes. Vascular/Lymphatic: No significant vascular findings are present. No enlarged abdominal or pelvic lymph nodes. Reproductive: Prostate is unremarkable. Other: None. Musculoskeletal: No acute or  significant osseous findings. IMPRESSION: 1. Findings consistent with acute pancreatitis. No pancreatic pseudocyst or evidence of pancreatic necrosis. 2. Fatty infiltration of liver. Electronically Signed   By: Sherian Rein M.D.   On: 01/07/2021 14:09    Discharge Instructions: Discharge Instructions    Call MD for:  difficulty breathing, headache or visual disturbances   Complete by: As directed    Call MD for:  persistant dizziness or light-headedness   Complete by: As directed    Call MD for:  persistant nausea and vomiting   Complete by: As directed    Call MD for:  severe uncontrolled pain   Complete by: As directed    Call MD for:  temperature >100.4   Complete by: As directed    Discharge instructions   Complete by: As directed    Mr. Lenzen,   While in the hospital, you were diagnosed with Diabetic Ketoacidosis that was from undiagnosed Diabetes Mellitus. You are mostly likely a Type 2 Diabetic, but we are still waiting on the testing to rule out Type 1. We will review these results with you at your follow up visit. Please start using insulin daily as instructed below:   - Lantus (long acting insulin) 30 units before bedtime. It will be very important to check your sugars when you wake up before you eat anything so we can adjust your Lantus dose. - Novolog (short acting insulin) 4 units with each meal. Do NOT take if you skip your meal!! Check your sugars daily before you eat so we can adjust your Novolog dose.   You were also diagnosed with Acute Pancreatitis and high Triglycerides. These tend to occur together in someone with Diabetes. To prevent future episodes and to help lower your cholesterol, we have started you on a cholesterol lowering medication called Atorvastatin. Please take 1 daily with dinner or at bedtime.   It was a pleasure meeting you and we wish you the best!   - Dr. Austin Miles   Increase activity slowly   Complete by: As directed       Signed: Merrilyn Puma, MD 01/11/2021, 2:32 PM   Pager: [phone]

## 2021-01-11 NOTE — Progress Notes (Incomplete)
   Subjective:   ***  Objective:  Vital signs in last 24 hours: Vitals:   01/10/21 1632 01/10/21 1940 01/10/21 2351 01/11/21 0342  BP: 135/89 (!) 144/93 (!) 142/104 (!) 145/99  Pulse: 87 100 (!) 101 97  Resp: 20 18 17 18   Temp: 98.6 F (37 C) 98.6 F (37 C) 98.5 F (36.9 C) 98.4 F (36.9 C)  TempSrc: Oral Oral Oral Oral  SpO2: 95% 95% 94% 93%  Weight:      Height:       ***   Assessment/Plan:  Active Problems:   Diabetic ketoacidosis (HCC)   Acute pancreatitis  DKA He is feeling a lot better today and was able to tolerate clear liquid diet yesterday night.  He was able to also have some pot roast for lunch after we advance his diet this morning.  He is also been able to ambulate without any difficulties.  His BMPs continue to show closed anion gap and his CBGs remain less than 180.  Will discontinue insulin infusion and begin basal bolus subcutaneous insulin. -Carb modified diet - Lantus 30 units daily - 4 units NovoLog subcutaneous 3 times daily with meals - Moderate sliding scale with meals and at bedtime scale - Autoantibodies pending to check for type 1 diabetes - Continue D5-LR at 125 cc/hr - IV Dilaudid for pain control and added Flexeril for back spasms - Daily BMP  Acute Pancreatitis 2/2 hypertriglyceridemia Although hypertriglyceridemia is improved, he will require additional triglyceride-lowering therapy at discharge. -will need statin at discharge -can consider addition of fibrates as outpatient, but unsure if patient will be able to tolerate both medications simultaneously  Hypokalemia Potassium 3.2 yesterday, given 80 mEq orally.  Potassium 3.0 today, likely due to insulin infusion.  Will receive 80 mEq orally and 4 runs of IV potassium today.  Recheck BMP tomorrow a.m.  Elevated BP Sinus tachycardia No history of hypertension. Could be from pain secondary to above.  Will monitor blood pressures.  Tachycardia seems to be improving, now down to low  100s.   Prior to Admission Living Arrangement: Home Anticipated Discharge Location: TBD Barriers to Discharge: continued medical management Dispo: Anticipated discharge in approximately 2-3 day(s).   , MD 01/11/2021, 7:03 AM Pager: [phone] After 5pm on weekdays and 1pm on weekends: On Call pager [phone]

## 2021-01-12 LAB — C-PEPTIDE: C-Peptide: 2 ng/mL (ref 1.1–4.4)

## 2021-01-16 ENCOUNTER — Telehealth: Payer: Self-pay | Admitting: Student

## 2021-01-16 NOTE — Telephone Encounter (Signed)
TOC HFU APPT [facility] FOR 01/19/2021 @ 1:15 PM WITH DR. Nedra Hai

## 2021-01-19 ENCOUNTER — Ambulatory Visit (INDEPENDENT_AMBULATORY_CARE_PROVIDER_SITE_OTHER): Payer: Self-pay | Admitting: Internal Medicine

## 2021-01-19 ENCOUNTER — Other Ambulatory Visit (HOSPITAL_COMMUNITY): Payer: Self-pay

## 2021-01-19 ENCOUNTER — Other Ambulatory Visit: Payer: Self-pay

## 2021-01-19 ENCOUNTER — Encounter: Payer: Self-pay | Admitting: Internal Medicine

## 2021-01-19 VITALS — BP 123/78 | HR 90 | Temp 99.1°F | Ht 63.0 in | Wt 203.1 lb

## 2021-01-19 DIAGNOSIS — E781 Pure hyperglyceridemia: Secondary | ICD-10-CM

## 2021-01-19 DIAGNOSIS — E1310 Other specified diabetes mellitus with ketoacidosis without coma: Secondary | ICD-10-CM

## 2021-01-19 DIAGNOSIS — E119 Type 2 diabetes mellitus without complications: Secondary | ICD-10-CM | POA: Insufficient documentation

## 2021-01-19 DIAGNOSIS — Z794 Long term (current) use of insulin: Secondary | ICD-10-CM

## 2021-01-19 MED ORDER — INSULIN GLARGINE 100 UNIT/ML SOLOSTAR PEN
30.0000 [IU] | PEN_INJECTOR | Freq: Every day | SUBCUTANEOUS | 2 refills | Status: DC
  Filled 2021-01-19 – 2021-01-20 (×2): qty 21, 70d supply, fill #0
  Filled 2021-01-24 – 2021-02-01 (×2): qty 9, 30d supply, fill #0
  Filled 2021-03-06: qty 9, 30d supply, fill #1
  Filled 2021-04-07: qty 9, 30d supply, fill #2
  Filled 2021-07-12: qty 9, 30d supply, fill #3
  Filled 2021-10-10: qty 9, 30d supply, fill #4

## 2021-01-19 MED ORDER — ATORVASTATIN CALCIUM 40 MG PO TABS
40.0000 mg | ORAL_TABLET | Freq: Every day | ORAL | 2 refills | Status: DC
  Filled 2021-01-19 – 2021-02-01 (×3): qty 30, 30d supply, fill #0
  Filled 2021-03-06: qty 30, 30d supply, fill #1
  Filled 2021-04-07: qty 30, 30d supply, fill #2

## 2021-01-19 MED ORDER — INSULIN ASPART 100 UNIT/ML FLEXPEN
4.0000 [IU] | PEN_INJECTOR | Freq: Three times a day (TID) | SUBCUTANEOUS | 3 refills | Status: DC
  Filled 2021-01-19 – 2021-01-20 (×2): qty 12, 100d supply, fill #0
  Filled 2021-01-24: qty 3, 25d supply, fill #0
  Filled 2021-02-01: qty 3, 25d supply, fill #1
  Filled 2021-02-15: qty 3, 25d supply, fill #2

## 2021-01-19 NOTE — Patient Instructions (Addendum)
Dear Mr.Ace Tomma Rakers,  Thank you for allowing [place] to provide your care today. Today we discussed your blood sugar    I have ordered no labs for you. I will call if any are abnormal.    Today we made the following changes to your medications:    Please take 4 units with meals for your novolog and titrate up the dose based on your mealtime blood sugar  Please follow-up in 3 months.    Please call the internal medicine center clinic if you have any questions or concerns, we may be able to help and keep you from a long and expensive emergency room wait. Our clinic and after hours phone number is [phone], the best time to call is Monday through Friday 9 am to 4 pm but there is always someone available 24/7 if you have an emergency. If you need medication refills please notify your pharmacy one week in advance and they will send [place] a request.    If you have not gotten the COVID vaccine, I recommend doing so:  You may get it at your local [facility] or [facility] OR To schedule an appointment for a COVID vaccine or be added to the vaccine wait list: Go to [email or web address]   OR Go to [email or web address]                  OR Call [phone]                                     OR Call [phone] and select Option 2  Thank you for choosing [place]

## 2021-01-19 NOTE — Assessment & Plan Note (Signed)
Lab Results  Component Value Date   HGBA1C 12.1 (H) 01/07/2021   George Howe is 35 yo M presenting to [facility] [place] for f/u visit after recent hospitalization for hypertriglyceridemia-induced pancreatitis and diabetic ketoacidosis. He was noted to have no prior diagnosis. Treated with insulin and discharged with novolog and lantus. He mentions having no recurrence of symptoms during discharge. He mentions monitoring his blood sugars regularly which shows average blood sugar ~120. Mentions some side effects when starting insulin right after discharge but these sxs have resolved. Denies any hypoglycemic events.    A/P Present w/ new diagnosed of diabetes in DKA. 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 Differential includes T2DM, MODY, pancreatitis-induced diabetes although family hx suggest MODY as most likely cause. Defer genetic testing until obtaining medical insurance. Glucose logs show post-prandial hyperglycemic spikes. Discussed using sliding scale to adjust insulin dose. - C/w insulin glargine 30 units qhs - Increase insulin aspart 4 units qc to 4+3-4 per 40mg  above 140mg  blood sugar level - Refills sent

## 2021-01-19 NOTE — Progress Notes (Signed)
   CC: Pancreatitis  HPI: Mr.George Howe is a 35 y.o. with PMH listed below presenting for HFU after pancreatitis. Please see problem based assessment and plan for further details.  No past medical history on file.  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 Recently quit tobacco use since hospitalization. Denies illicit substance use. Alcohol use maybe 1-2 a month. Lives with a roommate  Family Hx: Father and his 2 siblings had diabetes. Father passed away from complications of diabetes  Review of Systems: Review of Systems  Constitutional: Negative for chills, fever and malaise/fatigue.  Eyes: Negative for blurred vision.  Respiratory: Negative for shortness of breath.   Cardiovascular: Negative for chest pain, palpitations and leg swelling.  Gastrointestinal: Negative for constipation, diarrhea, nausea and vomiting.  Neurological: Negative for dizziness, sensory change, weakness and headaches.    Physical Exam: Vitals:   01/19/21 1324  BP: 123/78  Pulse: 90  Temp: 99.1 F (37.3 C)  TempSrc: Oral  SpO2: 99%  Weight: 203 lb 1.6 oz (92.1 kg)  Height: 5\' 3"  (1.6 m)   Gen: Well-developed, well nourished, NAD HEENT: NCAT head, hearing intact CV: RRR, S1, S2 normal Pulm: CTAB, No rales, no wheezes Abd: NTND, BS+ Extm: ROM intact, Peripheral pulses intact, No peripheral edema Skin: Dry, Warm, normal turgor  Assessment & Plan:   Hypertriglyceridemia Lab Results  Component Value Date   CHOL 213 (H) 01/11/2021   HDL 27 (L) 01/11/2021   LDLCALC 155 (H) 01/11/2021   TRIG 156 (H) 01/11/2021   CHOLHDL 7.9 01/11/2021   Presents for f/u after hypertriglyceridemia-induced pancreatitis. Treated with insulin. Lipid panel show mixed dyslipidemia. Likely due to untreated, uncontrolled diabetes. Trig improved from 1600 -> 156 at discharge. Currently on atorvastatin 40mg . Tolerating it well without side effects. Based on improvement, no indication for  additional therapies such as fibrates.  - C/w atorvastatin 40mg  daily  Diabetes mellitus (HCC) Lab Results  Component Value Date   HGBA1C 12.1 (H) 01/07/2021   Mr.George Howe is 35 yo M presenting to [facility] for f/u visit after recent hospitalization for hypertriglyceridemia-induced pancreatitis and diabetic ketoacidosis. He was noted to have no prior diagnosis. Treated with insulin and discharged with novolog and lantus. He mentions having no recurrence of symptoms during discharge. He mentions monitoring his blood sugars regularly which shows average blood sugar ~120. Mentions some side effects when starting insulin right after discharge but these sxs have resolved. Denies any hypoglycemic events.    A/P Present w/ new diagnosed of diabetes in DKA. 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 Differential includes T2DM, MODY, pancreatitis-induced diabetes although family hx suggest MODY as most likely cause. Defer genetic testing until obtaining medical insurance. Glucose logs show post-prandial hyperglycemic spikes. Discussed using sliding scale to adjust insulin dose. - C/w insulin glargine 30 units qhs - Increase insulin aspart 4 units qc to 4+3-4 per 40mg  above 140mg  blood sugar level - Refills sent   Patient discussed with Dr.Williams  -Tomma Rakers, PGY3 [facility] Internal Medicine Pager: [phone]

## 2021-01-19 NOTE — Telephone Encounter (Signed)
(  Late entry from 01/18/21 attempt, documented 01/19/21)   Transition Care Management Unsuccessful Follow-up Telephone Call  Date of discharge and from where:  01/11/21 [facility]  Attempts:  1st Attempt  Reason for unsuccessful TCM follow-up call:  No answer/busy

## 2021-01-19 NOTE — Assessment & Plan Note (Addendum)
Lab Results  Component Value Date   CHOL 213 (H) 01/11/2021   HDL 27 (L) 01/11/2021   LDLCALC 155 (H) 01/11/2021   TRIG 156 (H) 01/11/2021   CHOLHDL 7.9 01/11/2021   Presents for f/u after hypertriglyceridemia-induced pancreatitis. Treated with insulin. Lipid panel show mixed dyslipidemia. Likely due to untreated, uncontrolled diabetes. Trig improved from 1600 -> 156 at discharge. Currently on atorvastatin 40mg . Tolerating it well without side effects. Based on improvement, no indication for additional therapies such as fibrates.  - C/w atorvastatin 40mg  daily

## 2021-01-20 ENCOUNTER — Other Ambulatory Visit (HOSPITAL_COMMUNITY): Payer: Self-pay

## 2021-01-24 ENCOUNTER — Other Ambulatory Visit (HOSPITAL_COMMUNITY): Payer: Self-pay

## 2021-01-27 NOTE — Progress Notes (Signed)
Internal Medicine Clinic Attending  Case discussed with Dr. Lee  At the time of the visit.  We reviewed the resident's history and exam and pertinent patient test results.  I agree with the assessment, diagnosis, and plan of care documented in the resident's note.    

## 2021-01-31 ENCOUNTER — Encounter: Payer: Self-pay | Admitting: Internal Medicine

## 2021-02-01 ENCOUNTER — Inpatient Hospital Stay (INDEPENDENT_AMBULATORY_CARE_PROVIDER_SITE_OTHER): Payer: Self-pay | Admitting: Primary Care

## 2021-02-02 ENCOUNTER — Other Ambulatory Visit (HOSPITAL_COMMUNITY): Payer: Self-pay

## 2021-02-03 ENCOUNTER — Other Ambulatory Visit (HOSPITAL_COMMUNITY): Payer: Self-pay

## 2021-02-15 ENCOUNTER — Other Ambulatory Visit: Payer: Self-pay | Admitting: Internal Medicine

## 2021-02-15 ENCOUNTER — Telehealth: Payer: Self-pay

## 2021-02-15 ENCOUNTER — Other Ambulatory Visit (HOSPITAL_COMMUNITY): Payer: Self-pay

## 2021-02-15 ENCOUNTER — Encounter: Payer: Self-pay | Admitting: Internal Medicine

## 2021-02-15 DIAGNOSIS — E1310 Other specified diabetes mellitus with ketoacidosis without coma: Secondary | ICD-10-CM

## 2021-02-15 MED ORDER — INSULIN ASPART 100 UNIT/ML FLEXPEN
PEN_INJECTOR | SUBCUTANEOUS | 6 refills | Status: DC
  Filled 2021-02-15: qty 6, 28d supply, fill #0
  Filled 2021-04-07: qty 6, 28d supply, fill #1
  Filled 2021-07-12: qty 6, 28d supply, fill #2
  Filled 2021-10-10: qty 6, 28d supply, fill #3
  Filled 2021-11-17: qty 6, 28d supply, fill #4
  Filled 2021-12-18: qty 6, 28d supply, fill #5

## 2021-02-15 NOTE — Telephone Encounter (Signed)
Return pt's call. Pt states he has been running out of Novolog and the pharmacy has been sending Dr Nedra Hai a text message. States he's on a sliding scale; according to med list he's not. He states Dr Nedra Hai had added a sliding scale at last ov (01/19/21).   "Increase insulin aspart 4 units qc to 4+3-4 per 40mg  above 140mg  blood sugar level" He needs another rx sent to the pharmacy with new directions ; states he's afraid of running out of insulin. Thanks

## 2021-02-15 NOTE — Telephone Encounter (Signed)
Sent Novolog prescription with updated instructions to the [facility].

## 2021-02-15 NOTE — Telephone Encounter (Signed)
Requesting to speak with a nurse about insulin, please call pt back.  

## 2021-02-16 ENCOUNTER — Other Ambulatory Visit (HOSPITAL_COMMUNITY): Payer: Self-pay

## 2021-02-16 NOTE — Telephone Encounter (Signed)
Pt was called to inform of new novolog rx - no answer; left message on vm.

## 2021-03-06 ENCOUNTER — Other Ambulatory Visit (HOSPITAL_COMMUNITY): Payer: Self-pay

## 2021-03-07 ENCOUNTER — Other Ambulatory Visit: Payer: Self-pay

## 2021-03-08 ENCOUNTER — Encounter: Payer: Self-pay | Admitting: Internal Medicine

## 2021-03-08 ENCOUNTER — Ambulatory Visit (INDEPENDENT_AMBULATORY_CARE_PROVIDER_SITE_OTHER): Payer: 59 | Admitting: Internal Medicine

## 2021-03-08 VITALS — BP 120/88 | HR 82 | Temp 98.3°F | Ht 63.0 in | Wt 211.0 lb

## 2021-03-08 DIAGNOSIS — E1369 Other specified diabetes mellitus with other specified complication: Secondary | ICD-10-CM

## 2021-03-08 DIAGNOSIS — E781 Pure hyperglyceridemia: Secondary | ICD-10-CM

## 2021-03-08 DIAGNOSIS — Z794 Long term (current) use of insulin: Secondary | ICD-10-CM

## 2021-03-08 DIAGNOSIS — E785 Hyperlipidemia, unspecified: Secondary | ICD-10-CM

## 2021-03-08 DIAGNOSIS — F17210 Nicotine dependence, cigarettes, uncomplicated: Secondary | ICD-10-CM

## 2021-03-08 DIAGNOSIS — E1169 Type 2 diabetes mellitus with other specified complication: Secondary | ICD-10-CM

## 2021-03-08 DIAGNOSIS — E1310 Other specified diabetes mellitus with ketoacidosis without coma: Secondary | ICD-10-CM

## 2021-03-08 NOTE — Progress Notes (Signed)
New Patient Office Visit     This visit occurred during the SARS-CoV-2 public health emergency.  Safety protocols were in place, including screening questions prior to the visit, additional usage of staff PPE, and extensive cleaning of exam room while observing appropriate contact time as indicated for disinfecting solutions.    CC/Reason for Visit: Establish care, discuss chronic conditions Previous PCP: Internal medicine clinic Last Visit: 01/19/2021  HPI: George Howe is a 35 y.o. male who is coming in today for the above mentioned reasons. Past Medical History is significant for: Newly diagnosed diabetes after he presented to the hospital in April in DKA with pancreatitis due to hypertriglyceridemia with triglycerides above 1600.  He also has a history of ongoing tobacco use of a pack a day since age 35.  He is HF, he drinks alcohol occasionally, no known drug allergies.  He is concerned about his weight.  He would also like to discuss what he feels is depression.  He has not yet had diabetic counseling.  He has been started on atorvastatin 40 mg daily.  He has been having some pains in his thighs and joint aches.  He has been using Lantus 30 units at bedtime as well as NovoLog 4 units with meals plus correction scale.  I will insert his ambulatory log below:      Past Medical/Surgical History: Past Medical History:  Diagnosis Date   Chicken pox    Depression    Diabetes mellitus without complication (HCC)    DM (diabetes mellitus) (HCC)    Hay fever    Hyperlipidemia    Hyperlipidemia    Morbid obesity (HCC)     History reviewed. No pertinent surgical history.  Social History:  reports that he has been smoking cigarettes. He has a 17.00 pack-year smoking history. He has never used smokeless tobacco. He reports current alcohol use. He reports current drug use.  Allergies: No Known Allergies  Family History:  Family History  Problem Relation Age of Onset   Diabetes  Father    Diabetes Paternal Grandmother      Current Outpatient Medications:    atorvastatin (LIPITOR) 40 MG tablet, Take 1 tablet (40 mg total) by mouth daily., Disp: 30 tablet, Rfl: 2   insulin aspart (NOVOLOG) 100 UNIT/ML FlexPen, Inject 4 units with each meal. Additionally, add 3 units for each 40 points above 140., Disp: 12 mL, Rfl: 6   insulin glargine (LANTUS) 100 UNIT/ML Solostar Pen, Inject 30 Units into the skin at bedtime., Disp: 21 mL, Rfl: 2   Insulin Pen Needle (PEN NEEDLES) 32G X 4 MM MISC, Use 1 pen needle to inject insulin 4 times per day., Disp: 200 each, Rfl: 11   Multiple Vitamins-Minerals (MULTIVITAMIN WITH MINERALS) tablet, Take 1 tablet by mouth daily., Disp: , Rfl:   Review of Systems:  Constitutional: Denies fever, chills, diaphoresis, appetite change and fatigue.  HEENT: Denies photophobia, eye pain, redness, hearing loss, ear pain, congestion, sore throat, rhinorrhea, sneezing, mouth sores, trouble swallowing, neck pain, neck stiffness and tinnitus.   Respiratory: Denies SOB, DOE, cough, chest tightness,  and wheezing.   Cardiovascular: Denies chest pain, palpitations and leg swelling.  Gastrointestinal: Denies nausea, vomiting, abdominal pain, diarrhea, constipation, blood in stool and abdominal distention.  Genitourinary: Denies dysuria, urgency, frequency, hematuria, flank pain and difficulty urinating.  Endocrine: Denies: hot or cold intolerance, sweats, changes in hair or nails, polyuria, polydipsia. Musculoskeletal: Denies  back pain, joint swelling, arthralgias and gait problem.  Skin:  Denies pallor, rash and wound.  Neurological: Denies dizziness, seizures, syncope, weakness, light-headedness, numbness and headaches.  Hematological: Denies adenopathy. Easy bruising, personal or family bleeding history  Psychiatric/Behavioral: Denies suicidal ideation, mood changes, confusion, nervousness, sleep disturbance and agitation    Physical Exam: Vitals:    03/08/21 1352  BP: 120/88  Pulse: 82  Temp: 98.3 F (36.8 C)  TempSrc: Oral  SpO2: 97%  Weight: 211 lb (95.7 kg)  Height: 5\' 3"  (1.6 m)   Body mass index is 37.38 kg/m.  Constitutional: NAD, calm, comfortable, obese Eyes: PERRL, lids and conjunctivae normal, wears corrective lenses ENMT: Mucous membranes are moist.  Respiratory: clear to auscultation bilaterally, no wheezing, no crackles. Normal respiratory effort. No accessory muscle use.  Cardiovascular: Regular rate and rhythm, no murmurs / rubs / gallops. No extremity edema.  Neurologic: Grossly intact and nonfocal Psychiatric: Normal judgment and insight. Alert and oriented x 3. Normal mood.    Impression and Plan:  Other specified diabetes mellitus with ketoacidosis without coma, with long-term current use of insulin [facility])  -New diagnosis as of April 2022. -According to his CBG log control is much improved on his current regimen of 30 units of bedtime Lantus and 4 units of mealtime NovoLog with correction scale.  He only uses correction scale occasionally as his CBGs have been significantly improved. -Normal C-peptide levels and negative GAD and pancreatic islet cell antibodies would argue against type I.  We could eventually try and get him off insulin but I would prefer to wait for a longer period of stability.  Hypertriglyceridemia -Recheck lipids when he returns fasting.  Cigarette nicotine dependence without complication -We do not have much time to discuss this today.  He does appear to be willing to consider smoking cessation. -We can discuss initiation of Wellbutrin at next visit. -He will schedule counseling sessions.  Hyperlipidemia associated with type 2 diabetes mellitus (HCC) -He was started on atorvastatin 40 mg as of April.  Recheck lipids when he returns for CPE. -I suspect he is having some mild statin induced myalgias.  Since they are not severe, he will try and work through them as he understands  important benefits that statins provide.  Morbid obesity (HCC) -Discussed healthy lifestyle, including increased physical activity and better food choices to promote weight loss.      Patient Instructions  -Nice seeing you today!!  -Schedule follow up in 3 months for your physical. Please come in fasting that day.  -Remember to schedule counseling sessions.  -Referral placed for diabetes education.      May, MD [facility] at [facility]

## 2021-03-08 NOTE — Patient Instructions (Addendum)
-  Nice seeing you today!!  -Schedule follow up in 3 months for your physical. Please come in fasting that day.  -Remember to schedule counseling sessions.  -Referral placed for diabetes education.

## 2021-03-16 ENCOUNTER — Other Ambulatory Visit: Payer: Self-pay

## 2021-03-17 ENCOUNTER — Ambulatory Visit (INDEPENDENT_AMBULATORY_CARE_PROVIDER_SITE_OTHER): Payer: 59 | Admitting: Internal Medicine

## 2021-03-17 ENCOUNTER — Encounter: Payer: Self-pay | Admitting: Internal Medicine

## 2021-03-17 ENCOUNTER — Other Ambulatory Visit (HOSPITAL_COMMUNITY): Payer: Self-pay

## 2021-03-17 VITALS — BP 110/80 | HR 90 | Temp 98.8°F | Wt 210.6 lb

## 2021-03-17 DIAGNOSIS — F339 Major depressive disorder, recurrent, unspecified: Secondary | ICD-10-CM | POA: Diagnosis not present

## 2021-03-17 DIAGNOSIS — F411 Generalized anxiety disorder: Secondary | ICD-10-CM | POA: Diagnosis not present

## 2021-03-17 MED ORDER — SERTRALINE HCL 50 MG PO TABS
50.0000 mg | ORAL_TABLET | Freq: Every day | ORAL | 1 refills | Status: DC
  Filled 2021-03-17: qty 90, 90d supply, fill #0

## 2021-03-17 NOTE — Progress Notes (Signed)
Established Patient Office Visit     This visit occurred during the SARS-CoV-2 public health emergency.  Safety protocols were in place, including screening questions prior to the visit, additional usage of staff PPE, and extensive cleaning of exam room while observing appropriate contact time as indicated for disinfecting solutions.    CC/Reason for Visit: Discuss depression/anxiety  HPI: George Howe is a 35 y.o. male who is coming in today for the above mentioned reasons.  I saw him last week as a new patient.  He wanted to make this visit to further discuss his depression.  He did score a 21 on PHQ-9 at that visit.  We referred him to counseling, his first appointment is on July 15.  He believes he needs medication therapy.  He "lives in a constant state of worry".  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  He has not been sleeping well.  Past Medical/Surgical History: Past Medical History:  Diagnosis Date   Chicken pox    Depression    Diabetes mellitus without complication (HCC)    DM (diabetes mellitus) (HCC)    Hay fever    Hyperlipidemia    Hyperlipidemia    Morbid obesity (HCC)     No past surgical history on file.  Social History:  reports that he has been smoking cigarettes. He has a 17.00 pack-year smoking history. He has never used smokeless tobacco. He reports current alcohol use. He reports current drug use.  Allergies: No Known Allergies  Family History:  Family History  Problem Relation Age of Onset   Diabetes Father    Diabetes Paternal Grandmother      Current Outpatient Medications:    atorvastatin (LIPITOR) 40 MG tablet, Take 1 tablet (40 mg total) by mouth daily., Disp: 30 tablet, Rfl: 2   insulin aspart (NOVOLOG) 100 UNIT/ML FlexPen, Inject 4 units with each meal. Additionally, add 3 units for each 40 points above 140., Disp: 12 mL, Rfl: 6   insulin glargine (LANTUS) 100 UNIT/ML Solostar Pen, Inject 30  Units into the skin at bedtime., Disp: 21 mL, Rfl: 2   Insulin Pen Needle (PEN NEEDLES) 32G X 4 MM MISC, Use 1 pen needle to inject insulin 4 times per day., Disp: 200 each, Rfl: 11   Multiple Vitamins-Minerals (MULTIVITAMIN WITH MINERALS) tablet, Take 1 tablet by mouth daily., Disp: , Rfl:    sertraline (ZOLOFT) 50 MG tablet, Take 1 tablet (50 mg total) by mouth daily., Disp: 90 tablet, Rfl: 1  Review of Systems:  Constitutional: Denies fever, chills, diaphoresis. HEENT: Denies photophobia, eye pain, redness, hearing loss, ear pain, congestion, sore throat, rhinorrhea, sneezing, mouth sores, trouble swallowing, neck pain, neck stiffness and tinnitus.   Respiratory: Denies SOB, DOE, cough, chest tightness,  and wheezing.   Cardiovascular: Denies chest pain, palpitations and leg swelling.  Gastrointestinal: Denies nausea, vomiting, abdominal pain, diarrhea, constipation, blood in stool and abdominal distention.  Genitourinary: Denies dysuria, urgency, frequency, hematuria, flank pain and difficulty urinating.  Endocrine: Denies: hot or cold intolerance, sweats, changes in hair or nails, polyuria, polydipsia. Musculoskeletal: Denies myalgias, back pain, joint swelling, arthralgias and gait problem.  Skin: Denies pallor, rash and wound.  Neurological: Denies dizziness, seizures, syncope, weakness, light-headedness, numbness and headaches.  Hematological: Denies adenopathy. Easy bruising, personal or family bleeding history  Psychiatric/Behavioral: Denies suicidal ideation, , confusion and agitation    Physical Exam: Vitals:   03/17/21 0951  BP: 110/80  Pulse:  90  Temp: 98.8 F (37.1 C)  TempSrc: Oral  SpO2: 99%  Weight: 210 lb 9.6 oz (95.5 kg)    Body mass index is 37.31 kg/m.   Constitutional: NAD, calm, comfortable Eyes: PERRL, lids and conjunctivae normal ENMT: Mucous membranes are moist.  Neurologic: Grossly intact and nonfocal Psychiatric: Normal judgment and insight. Alert  and oriented x 3. Normal mood.    Impression and Plan:  GAD (generalized anxiety disorder)  Depression, recurrent (HCC)   -I agree that starting medications is probably appropriate.  We have agreed on Zoloft 50 mg that he will take daily. -He already has his CBT sessions scheduled. -Follow-up in 8 to 12 weeks.  Time spent: 30 minutes reviewing chart, interviewing patient and formulating plan of care.   Chaya Jan, MD [facility] at [facility]

## 2021-04-05 ENCOUNTER — Ambulatory Visit (INDEPENDENT_AMBULATORY_CARE_PROVIDER_SITE_OTHER): Payer: 59 | Admitting: Psychologist

## 2021-04-05 DIAGNOSIS — F331 Major depressive disorder, recurrent, moderate: Secondary | ICD-10-CM | POA: Diagnosis not present

## 2021-04-05 DIAGNOSIS — F411 Generalized anxiety disorder: Secondary | ICD-10-CM

## 2021-04-07 ENCOUNTER — Other Ambulatory Visit (HOSPITAL_COMMUNITY): Payer: Self-pay

## 2021-04-10 ENCOUNTER — Ambulatory Visit (INDEPENDENT_AMBULATORY_CARE_PROVIDER_SITE_OTHER): Payer: Self-pay | Admitting: Primary Care

## 2021-04-17 ENCOUNTER — Ambulatory Visit (INDEPENDENT_AMBULATORY_CARE_PROVIDER_SITE_OTHER): Payer: 59 | Admitting: Psychologist

## 2021-04-17 DIAGNOSIS — F411 Generalized anxiety disorder: Secondary | ICD-10-CM | POA: Diagnosis not present

## 2021-04-17 DIAGNOSIS — F331 Major depressive disorder, recurrent, moderate: Secondary | ICD-10-CM | POA: Diagnosis not present

## 2021-04-26 ENCOUNTER — Ambulatory Visit (INDEPENDENT_AMBULATORY_CARE_PROVIDER_SITE_OTHER): Payer: 59 | Admitting: Psychologist

## 2021-04-26 DIAGNOSIS — F411 Generalized anxiety disorder: Secondary | ICD-10-CM

## 2021-04-26 DIAGNOSIS — F331 Major depressive disorder, recurrent, moderate: Secondary | ICD-10-CM | POA: Diagnosis not present

## 2021-04-30 ENCOUNTER — Emergency Department (HOSPITAL_COMMUNITY)
Admission: EM | Admit: 2021-04-30 | Discharge: 2021-04-30 | Disposition: A | Discharge status: Other Acute Inpatient Hospital | Payer: 59 | Attending: Emergency Medicine | Admitting: Emergency Medicine

## 2021-04-30 ENCOUNTER — Inpatient Hospital Stay (HOSPITAL_COMMUNITY)
Admission: RE | Admit: 2021-04-30 | Discharge: 2021-05-09 | DRG: 885 | Disposition: A | Discharge status: Home / Self Care | Payer: 59 | Attending: Emergency Medicine | Admitting: Emergency Medicine

## 2021-04-30 ENCOUNTER — Encounter (HOSPITAL_COMMUNITY): Payer: Self-pay | Admitting: Emergency Medicine

## 2021-04-30 ENCOUNTER — Other Ambulatory Visit: Payer: Self-pay

## 2021-04-30 DIAGNOSIS — Y902 Blood alcohol level of 40-59 mg/100 ml: Secondary | ICD-10-CM | POA: Diagnosis not present

## 2021-04-30 DIAGNOSIS — F17210 Nicotine dependence, cigarettes, uncomplicated: Secondary | ICD-10-CM | POA: Insufficient documentation

## 2021-04-30 DIAGNOSIS — R45851 Suicidal ideations: Secondary | ICD-10-CM | POA: Insufficient documentation

## 2021-04-30 DIAGNOSIS — L209 Atopic dermatitis, unspecified: Secondary | ICD-10-CM | POA: Diagnosis present

## 2021-04-30 DIAGNOSIS — Z79899 Other long term (current) drug therapy: Secondary | ICD-10-CM | POA: Diagnosis not present

## 2021-04-30 DIAGNOSIS — F332 Major depressive disorder, recurrent severe without psychotic features: Secondary | ICD-10-CM | POA: Insufficient documentation

## 2021-04-30 DIAGNOSIS — E1169 Type 2 diabetes mellitus with other specified complication: Secondary | ICD-10-CM | POA: Insufficient documentation

## 2021-04-30 DIAGNOSIS — Z794 Long term (current) use of insulin: Secondary | ICD-10-CM | POA: Insufficient documentation

## 2021-04-30 DIAGNOSIS — U071 COVID-19: Secondary | ICD-10-CM | POA: Diagnosis present

## 2021-04-30 DIAGNOSIS — G4700 Insomnia, unspecified: Secondary | ICD-10-CM | POA: Diagnosis present

## 2021-04-30 DIAGNOSIS — R509 Fever, unspecified: Secondary | ICD-10-CM

## 2021-04-30 DIAGNOSIS — E785 Hyperlipidemia, unspecified: Secondary | ICD-10-CM | POA: Diagnosis present

## 2021-04-30 DIAGNOSIS — F411 Generalized anxiety disorder: Secondary | ICD-10-CM | POA: Diagnosis present

## 2021-04-30 DIAGNOSIS — E119 Type 2 diabetes mellitus without complications: Secondary | ICD-10-CM | POA: Diagnosis present

## 2021-04-30 DIAGNOSIS — R6883 Chills (without fever): Secondary | ICD-10-CM

## 2021-04-30 DIAGNOSIS — T1491XA Suicide attempt, initial encounter: Secondary | ICD-10-CM

## 2021-04-30 DIAGNOSIS — Z20822 Contact with and (suspected) exposure to covid-19: Secondary | ICD-10-CM | POA: Diagnosis not present

## 2021-04-30 LAB — CBC WITH DIFFERENTIAL/PLATELET
Abs Immature Granulocytes: 0.02 10*3/uL (ref 0.00–0.07)
Basophils Absolute: 0 10*3/uL (ref 0.0–0.1)
Basophils Relative: 0 %
Eosinophils Absolute: 0.1 10*3/uL (ref 0.0–0.5)
Eosinophils Relative: 1 %
HCT: 46.1 % (ref 39.0–52.0)
Hemoglobin: 15.8 g/dL (ref 13.0–17.0)
Immature Granulocytes: 0 %
Lymphocytes Relative: 25 %
Lymphs Abs: 2.9 10*3/uL (ref 0.7–4.0)
MCH: 29 pg (ref 26.0–34.0)
MCHC: 34.3 g/dL (ref 30.0–36.0)
MCV: 84.7 fL (ref 80.0–100.0)
Monocytes Absolute: 0.7 10*3/uL (ref 0.1–1.0)
Monocytes Relative: 6 %
Neutro Abs: 7.8 10*3/uL — ABNORMAL HIGH (ref 1.7–7.7)
Neutrophils Relative %: 68 %
Platelets: 258 10*3/uL (ref 150–400)
RBC: 5.44 MIL/uL (ref 4.22–5.81)
RDW: 12.7 % (ref 11.5–15.5)
WBC: 11.5 10*3/uL — ABNORMAL HIGH (ref 4.0–10.5)
nRBC: 0 % (ref 0.0–0.2)

## 2021-04-30 LAB — RAPID URINE DRUG SCREEN, HOSP PERFORMED
Amphetamines: NOT DETECTED
Barbiturates: NOT DETECTED
Benzodiazepines: NOT DETECTED
Cocaine: NOT DETECTED
Opiates: NOT DETECTED
Tetrahydrocannabinol: POSITIVE — AB

## 2021-04-30 LAB — COMPREHENSIVE METABOLIC PANEL
ALT: 137 U/L — ABNORMAL HIGH (ref 0–44)
AST: 43 U/L — ABNORMAL HIGH (ref 15–41)
Albumin: 4.1 g/dL (ref 3.5–5.0)
Alkaline Phosphatase: 81 U/L (ref 38–126)
Anion gap: 12 (ref 5–15)
BUN: 11 mg/dL (ref 6–20)
CO2: 21 mmol/L — ABNORMAL LOW (ref 22–32)
Calcium: 9.1 mg/dL (ref 8.9–10.3)
Chloride: 102 mmol/L (ref 98–111)
Creatinine, Ser: 0.99 mg/dL (ref 0.61–1.24)
GFR, Estimated: >60 mL/min (ref >60)
Glucose, Bld: 302 mg/dL — ABNORMAL HIGH (ref 70–99)
Potassium: 3.9 mmol/L (ref 3.5–5.1)
Sodium: 135 mmol/L (ref 135–145)
Total Bilirubin: 1.3 mg/dL — ABNORMAL HIGH (ref 0.3–1.2)
Total Protein: 6.6 g/dL (ref 6.5–8.1)

## 2021-04-30 LAB — CBG MONITORING, ED: Glucose-Capillary: 209 mg/dL — ABNORMAL HIGH (ref 70–99)

## 2021-04-30 LAB — ACETAMINOPHEN LEVEL: Acetaminophen (Tylenol), Serum: <10 ug/mL — ABNORMAL LOW (ref 10–30)

## 2021-04-30 LAB — SALICYLATE LEVEL: Salicylate Lvl: <7 mg/dL — ABNORMAL LOW (ref 7.0–30.0)

## 2021-04-30 LAB — HEMOGLOBIN A1C
Hgb A1c MFr Bld: 6.5 % — ABNORMAL HIGH (ref 4.8–5.6)
Mean Plasma Glucose: 139.85 mg/dL

## 2021-04-30 LAB — RESP PANEL BY RT-PCR (FLU A&B, COVID) ARPGX2
Influenza A by PCR: NEGATIVE
Influenza B by PCR: NEGATIVE
SARS Coronavirus 2 by RT PCR: NEGATIVE

## 2021-04-30 LAB — ETHANOL: Alcohol, Ethyl (B): 45 mg/dL — ABNORMAL HIGH (ref <10)

## 2021-04-30 LAB — GLUCOSE, CAPILLARY
Glucose-Capillary: 185 mg/dL — ABNORMAL HIGH (ref 70–99)
Glucose-Capillary: 214 mg/dL — ABNORMAL HIGH (ref 70–99)

## 2021-04-30 MED ORDER — INSULIN ASPART 100 UNIT/ML IJ SOLN: 0.0000 [IU] | Freq: Every day | INTRAMUSCULAR | Status: DC

## 2021-04-30 MED ORDER — ALUM & MAG HYDROXIDE-SIMETH 200-200-20 MG/5ML PO SUSP
30.0000 mL | ORAL | Status: DC | PRN
Start: 2021-04-30 — End: 2021-05-09

## 2021-04-30 MED ORDER — LORATADINE 10 MG PO TABS
10.0000 mg | ORAL_TABLET | Freq: Every day | ORAL | Status: DC
  Administered 2021-05-01 – 2021-05-09 (×9): 10 mg via ORAL
  Filled 2021-04-30 (×14): qty 1

## 2021-04-30 MED ORDER — TRAZODONE HCL 50 MG PO TABS
50.0000 mg | ORAL_TABLET | Freq: Every evening | ORAL | Status: DC | PRN
  Administered 2021-04-30 (×2): 50 mg via ORAL
  Filled 2021-04-30 (×6): qty 1

## 2021-04-30 MED ORDER — INSULIN ASPART 100 UNIT/ML IJ SOLN
0.0000 [IU] | Freq: Three times a day (TID) | INTRAMUSCULAR | Status: DC
  Administered 2021-04-30: 3 [IU] via SUBCUTANEOUS
  Administered 2021-05-01: 2 [IU] via SUBCUTANEOUS
  Administered 2021-05-01 – 2021-05-02 (×4): 3 [IU] via SUBCUTANEOUS
  Administered 2021-05-02: 2 [IU] via SUBCUTANEOUS
  Administered 2021-05-03: 5 [IU] via SUBCUTANEOUS
  Administered 2021-05-03: 3 [IU] via SUBCUTANEOUS
  Administered 2021-05-03: 5 [IU] via SUBCUTANEOUS
  Administered 2021-05-04 – 2021-05-05 (×4): 3 [IU] via SUBCUTANEOUS
  Administered 2021-05-06: 2 [IU] via SUBCUTANEOUS
  Administered 2021-05-06: 3 [IU] via SUBCUTANEOUS
  Administered 2021-05-07: 2 [IU] via SUBCUTANEOUS
  Filled 2021-04-30: qty 0.15

## 2021-04-30 MED ORDER — ADULT MULTIVITAMIN W/MINERALS CH
1.0000 | ORAL_TABLET | Freq: Every day | ORAL | Status: DC
Start: 2021-04-30 — End: 2021-05-09
  Administered 2021-05-01 – 2021-05-09 (×9): 1 via ORAL
  Filled 2021-04-30 (×14): qty 1

## 2021-04-30 MED ORDER — INSULIN ASPART 100 UNIT/ML IJ SOLN
0.0000 [IU] | Freq: Every day | INTRAMUSCULAR | Status: DC
Start: 2021-04-30 — End: 2021-05-09
  Administered 2021-04-30 – 2021-05-05 (×3): 2 [IU] via SUBCUTANEOUS
  Filled 2021-04-30: qty 0.05

## 2021-04-30 MED ORDER — THIAMINE HCL 100 MG PO TABS
100.0000 mg | ORAL_TABLET | Freq: Every day | ORAL | Status: DC
  Administered 2021-05-01 – 2021-05-09 (×9): 100 mg via ORAL
  Filled 2021-04-30 (×12): qty 1

## 2021-04-30 MED ORDER — LOPERAMIDE HCL 2 MG PO CAPS: 2.0000 mg | ORAL_CAPSULE | ORAL | Status: AC | PRN

## 2021-04-30 MED ORDER — INSULIN GLARGINE-YFGN 100 UNIT/ML ~~LOC~~ SOLN
30.0000 [IU] | Freq: Every day | SUBCUTANEOUS | Status: DC
Start: 2021-04-30 — End: 2021-05-01
  Administered 2021-04-30: 30 [IU] via SUBCUTANEOUS
  Filled 2021-04-30 (×4): qty 0.3

## 2021-04-30 MED ORDER — NICOTINE 21 MG/24HR TD PT24
21.0000 mg | MEDICATED_PATCH | Freq: Once | TRANSDERMAL | Status: DC
Start: 2021-04-30 — End: 2021-04-30
  Administered 2021-04-30: 21 mg via TRANSDERMAL
  Filled 2021-04-30: qty 1

## 2021-04-30 MED ORDER — ONDANSETRON 4 MG PO TBDP
4.0000 mg | ORAL_TABLET | Freq: Four times a day (QID) | ORAL | Status: AC | PRN
  Administered 2021-05-02: 4 mg via ORAL
  Filled 2021-04-30: qty 1

## 2021-04-30 MED ORDER — ATORVASTATIN CALCIUM 40 MG PO TABS: 40.0000 mg | ORAL_TABLET | Freq: Every day | ORAL | Status: DC

## 2021-04-30 MED ORDER — HYDROXYZINE HCL 25 MG PO TABS
25.0000 mg | ORAL_TABLET | Freq: Three times a day (TID) | ORAL | Status: DC | PRN
  Administered 2021-05-01 – 2021-05-05 (×7): 25 mg via ORAL
  Filled 2021-04-30 (×2): qty 1
  Filled 2021-04-30: qty 10
  Filled 2021-04-30 (×5): qty 1

## 2021-04-30 MED ORDER — NICOTINE 21 MG/24HR TD PT24
21.0000 mg | MEDICATED_PATCH | Freq: Once | TRANSDERMAL | Status: DC
  Filled 2021-04-30: qty 1

## 2021-04-30 MED ORDER — INSULIN ASPART 100 UNIT/ML IJ SOLN
0.0000 [IU] | Freq: Three times a day (TID) | INTRAMUSCULAR | Status: DC
  Administered 2021-04-30: 5 [IU] via SUBCUTANEOUS

## 2021-04-30 MED ORDER — ATORVASTATIN CALCIUM 40 MG PO TABS
40.0000 mg | ORAL_TABLET | Freq: Every day | ORAL | Status: DC
  Administered 2021-04-30 – 2021-05-08 (×9): 40 mg via ORAL
  Filled 2021-04-30 (×5): qty 1
  Filled 2021-04-30: qty 7
  Filled 2021-04-30 (×5): qty 1
  Filled 2021-04-30: qty 7
  Filled 2021-04-30: qty 1
  Filled 2021-04-30: qty 7

## 2021-04-30 MED ORDER — LORAZEPAM 1 MG PO TABS
1.0000 mg | ORAL_TABLET | Freq: Four times a day (QID) | ORAL | Status: AC | PRN
  Administered 2021-05-02 – 2021-05-03 (×2): 1 mg via ORAL
  Filled 2021-04-30 (×2): qty 1

## 2021-04-30 MED ORDER — ACETAMINOPHEN 325 MG PO TABS
650.0000 mg | ORAL_TABLET | Freq: Four times a day (QID) | ORAL | Status: DC | PRN
  Administered 2021-05-01 – 2021-05-03 (×3): 650 mg via ORAL
  Filled 2021-04-30 (×3): qty 2

## 2021-04-30 MED ORDER — SERTRALINE HCL 50 MG PO TABS
50.0000 mg | ORAL_TABLET | Freq: Every day | ORAL | Status: DC
  Administered 2021-05-01: 50 mg via ORAL
  Filled 2021-04-30 (×4): qty 1

## 2021-04-30 NOTE — Progress Notes (Signed)
Patient ID: George Howe, male   DOB: 06/11/1986, 35 y.o.   MRN: [id number] Patient has been accepted to [id number]. Please call report to [phone].

## 2021-04-30 NOTE — ED Provider Notes (Signed)
Patient is excepted by [facility] provider is [name].  Prior to that patient had been accepted to [facility] [place].  But they did not have a bed available until tomorrow.  So patient will be going to [facility] behavioral health.  [place] police here to escort patient.   [name], MD 04/30/21 [phone]

## 2021-04-30 NOTE — Progress Notes (Signed)
Per Cler, pt has been accepted to [facility] 2W unit. Accepting provider is Otho Perl. Patient can arrive 05/01/2021 after 10:00am. Number for report is [phone].   Crissie Reese, MSW, LCSW-A Phone: [phone] Disposition/TOC

## 2021-04-30 NOTE — ED Notes (Signed)
Ruthy Dick, MD made aware about patient's tranfer to [facility] to complete EMTALA. Per MD, EMTALA form not needed when patient is transported to [facility]. Patient packet and belongings given to [facility] deputies.

## 2021-04-30 NOTE — ED Provider Notes (Signed)
[facility] EMERGENCY DEPARTMENT Provider Note   CSN: [id number] Arrival date & time: 04/30/21  0433     History Chief Complaint  Patient presents with   Mental Health Problem     George Howe is a 35 y.o. male.  Patient here with suicidal thoughts.  Thought about hurting himself last night with a knife.  Here voluntarily.  History of depression and started on Zoloft recently but has not felt much better.  The history is provided by the patient.  Mental Health Problem Presenting symptoms: suicidal thoughts and suicidal threats   Degree of incapacity (severity):  Mild Onset quality:  Gradual Timing:  Constant Progression:  Unchanged Chronicity:  New Relieved by:  Nothing Worsened by:  Alcohol Associated symptoms: no abdominal pain and no chest pain   Risk factors: hx of mental illness       Past Medical History:  Diagnosis Date   Chicken pox    Depression    Diabetes mellitus without complication ([place])    DM (diabetes mellitus) ([place])    Hay fever    Hyperlipidemia    Hyperlipidemia    Morbid obesity ([place])     Patient Active Problem List   Diagnosis Date Noted   Hyperlipidemia 03/08/2021   Morbid obesity ([place]) 03/08/2021   Diabetes mellitus ([place]) 01/19/2021   Hypertriglyceridemia     No past surgical history on file.     Family History  Problem Relation Age of Onset   Diabetes Father    Diabetes Paternal Grandmother     Social History   Tobacco Use   Smoking status: Every Day    Packs/day: 1.00    Years: 17.00    Pack years: 17.00    Types: Cigarettes   Smokeless tobacco: Never  Substance Use Topics   Alcohol use: Yes    Comment: occasional   Drug use: Yes    Home Medications Prior to Admission medications   Medication Sig Start Date End Date Taking? Authorizing Provider  atorvastatin (LIPITOR) 40 MG tablet Take 1 tablet (40 mg total) by mouth daily. Patient taking differently: Take 40 mg by mouth at bedtime. 01/19/21   Yes Mosetta Anis, MD  blood glucose meter kit and supplies KIT 1 each. Check blood sugar before meals and at bedtime   Yes [provider]  cetirizine (ZYRTEC) 10 MG tablet Take 10 mg by mouth daily as needed for allergies.   Yes [provider]  ibuprofen (ADVIL) 200 MG tablet Take 800 mg by mouth every 6 (six) hours as needed for headache or moderate pain.   Yes [provider]  insulin aspart (NOVOLOG) 100 UNIT/ML FlexPen Inject 4 units with each meal. Additionally, add 3 units for each 40 points above 140. 02/15/21  Yes Jose Persia, MD  insulin glargine (LANTUS) 100 UNIT/ML Solostar Pen Inject 30 Units into the skin at bedtime. 01/19/21  Yes Mosetta Anis, MD  Insulin Pen Needle (PEN NEEDLES) 32G X 4 MM MISC Use 1 pen needle to inject insulin 4 times per day. 01/11/21  Yes Jose Persia, MD  Multiple Vitamins-Minerals (MULTIVITAMIN WITH MINERALS) tablet Take 1 tablet by mouth daily.   Yes [provider]  sertraline (ZOLOFT) 50 MG tablet Take 1 tablet (50 mg total) by mouth daily. 03/17/21  Yes Erline Hau, MD    Allergies    Patient has no known allergies.  Review of Systems   Review of Systems  Constitutional:  Negative for chills  and fever.  HENT:  Negative for ear pain and sore throat.   Eyes:  Negative for pain and visual disturbance.  Respiratory:  Negative for cough and shortness of breath.   Cardiovascular:  Negative for chest pain and palpitations.  Gastrointestinal:  Negative for abdominal pain and vomiting.  Genitourinary:  Negative for dysuria and hematuria.  Musculoskeletal:  Negative for arthralgias and back pain.  Skin:  Negative for color change and rash.  Neurological:  Negative for seizures and syncope.  Psychiatric/Behavioral:  Positive for suicidal ideas.   All other systems reviewed and are negative.  Physical Exam Updated Vital Signs BP 133/86 (BP Location: Right Arm)   Pulse 97   Temp 98.5 F (36.9 C)  (Oral)   Resp 18   Ht _0  (1.575 m)   Wt 97.5 kg   SpO2 97%   BMI 39.32 kg/m   Physical Exam Vitals and nursing note reviewed.  Constitutional:      Appearance: He is well-developed.  HENT:     Head: Normocephalic and atraumatic.  Eyes:     Conjunctiva/sclera: Conjunctivae normal.  Cardiovascular:     Rate and Rhythm: Normal rate and regular rhythm.     Pulses: Normal pulses.     Heart sounds: No murmur heard. Pulmonary:     Effort: Pulmonary effort is normal. No respiratory distress.     Breath sounds: Normal breath sounds.  Abdominal:     Palpations: Abdomen is soft.     Tenderness: There is no abdominal tenderness.  Musculoskeletal:     Cervical back: Neck supple.  Skin:    General: Skin is warm and dry.  Neurological:     Mental Status: He is alert.  Psychiatric:        Mood and Affect: Mood is depressed.        Thought Content: Thought content includes suicidal ideation.    ED Results / Procedures / Treatments   Labs (all labs ordered are listed, but only abnormal results are displayed) Labs Reviewed  COMPREHENSIVE METABOLIC PANEL - Abnormal; Notable for the following components:      Result Value   CO2 21 (*)    Glucose, Bld 302 (*)    AST 43 (*)    ALT 137 (*)    Total Bilirubin 1.3 (*)    All other components within normal limits  SALICYLATE LEVEL - Abnormal; Notable for the following components:   Salicylate Lvl <1.7 (*)    All other components within normal limits  ACETAMINOPHEN LEVEL - Abnormal; Notable for the following components:   Acetaminophen (Tylenol), Serum <10 (*)    All other components within normal limits  ETHANOL - Abnormal; Notable for the following components:   Alcohol, Ethyl (B) 45 (*)    All other components within normal limits  RAPID URINE DRUG SCREEN, HOSP PERFORMED - Abnormal; Notable for the following components:   Tetrahydrocannabinol POSITIVE (*)    All other components within normal limits  CBC WITH  DIFFERENTIAL/PLATELET - Abnormal; Notable for the following components:   WBC 11.5 (*)    Neutro Abs 7.8 (*)    All other components within normal limits  RESP PANEL BY RT-PCR (FLU A&B, COVID) ARPGX2  HEMOGLOBIN A1C    EKG None  Radiology No results found.  Procedures Procedures   Medications Ordered in ED Medications  insulin aspart (novoLOG) injection 0-15 Units (has no administration in time range)  insulin aspart (novoLOG) injection 0-5 Units (has no administration in time  range)  atorvastatin (LIPITOR) tablet 40 mg (has no administration in time range)    ED Course  I have reviewed the triage vital signs and the nursing notes.  Pertinent labs & imaging results that were available during my care of the patient were reviewed by me and considered in my medical decision making (see chart for details).    MDM Rules/Calculators/A&P                           George Howe is here with suicidal thoughts.  Thought about hurting himself last night with a knife.  Is on Zoloft that was started several weeks ago.  Still feels depressed and suicidal.  History of diabetes.  Medically cleared.  Blood sugar mildly elevated but will start him on sliding scale insulin.  He is here voluntarily.  He is overall calm and pleasant.  We will have him evaluated by psychiatry but suspect that he would do well with inpatient treatment.  Awaiting psychiatry recommendations.  This chart was dictated using voice recognition software.  Despite best efforts to proofread,  errors can occur which can change the documentation meaning.   Final Clinical Impression(s) / ED Diagnoses Final diagnoses:  Suicidal behavior with attempted self-injury [facility])    Rx / [place] Orders ED Discharge Orders     None        Lennice Sites, DO 04/30/21 [phone]

## 2021-04-30 NOTE — Progress Notes (Signed)
   04/30/21 2345  Psych Admission Type (Psych Patients Only)  Admission Status Voluntary  Psychosocial Assessment  Patient Complaints Depression  Eye Contact Fair  Facial Expression Animated  Affect Appropriate to circumstance  Speech Logical/coherent  Interaction Assertive  Motor Activity Other (Comment) (WDL)  Appearance/Hygiene Unremarkable  Behavior Characteristics Appropriate to situation  Mood Pleasant  Thought Process  Coherency WDL  Content WDL  Delusions None reported or observed  Perception WDL  Hallucination None reported or observed  Judgment Impaired  Confusion None  Danger to Self  Current suicidal ideation? Denies  Danger to Others  Danger to Others None reported or observed

## 2021-04-30 NOTE — Progress Notes (Signed)
Patient is a 35 year old male who presented under IVC for SI with a plan to kill himself with a knife. Pt reported that he was holding the knife to his throat when his dog came and laid beside him. Being worried for his dog, he then decided to call his friend instead. 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 Pt endorses insomnia, increased anxiety and depression, and decreased concentration. Pt reports occasional alcohol use: 2 x a month, drinking a couple of sugar-free hard ciders. Pt reported a hx of alcohol abuse, but has cut back drastically due to his recent DM diagnosis. Pt also reported using weed a couple of times a week "for anxiety". Pt currently denies pain, SI/HI and A/VH. VS obtained and were recorded. Admission paperwork completed and signed. Skin assessment revealed no abnormalities. Belongings searched and secured in locker. Pt oriented to unit. Q 15 min checks initiated for safety.

## 2021-04-30 NOTE — Tx Team (Signed)
Initial Treatment Plan 04/30/2021 8:47 PM [name] JAS:[id number]    PATIENT STRESSORS: Financial difficulties Health problems   PATIENT STRENGTHS: Average or above average intelligence Capable of independent living Communication skills Motivation for treatment/growth Supportive family/friends Work skills   PATIENT IDENTIFIED PROBLEMS: Suicidal ideation     " Increased depression"     "Insomnia"             DISCHARGE CRITERIA:  Improved stabilization in mood, thinking, and/or behavior Reduction of life-threatening or endangering symptoms to within safe limits Verbal commitment to aftercare and medication compliance  PRELIMINARY DISCHARGE PLAN: Outpatient therapy Return to previous living arrangement Return to previous work or school arrangements  PATIENT/FAMILY INVOLVEMENT: This treatment plan has been presented to and reviewed with the patient, George Howe,   The patient has been given the opportunity to ask questions and make suggestions.  Shela Nevin, RN 04/30/2021, 8:47 PM

## 2021-04-30 NOTE — ED Notes (Signed)
Patient eating breakfast at this time. Sitter arrived to sit with patient.

## 2021-04-30 NOTE — ED Notes (Signed)
Pt leaving with law enforcement to [facility].

## 2021-04-30 NOTE — ED Notes (Signed)
Pt ambulated to the bathroom with no issue. 

## 2021-04-30 NOTE — Progress Notes (Signed)
Per Vidal Schwalbe, patient meets criteria for inpatient treatment. There are no available or appropriate beds at [facility] today. CSW faxed referrals to the following facilities for review:  [facility]  Pending - Request Sent N/A [address] [place] [address] [phone] --  [facility]  Pending - Request Sent N/A [address] [place] [address] [phone] --  [facility]  Pending - Request Sent N/A [address] [place] [address] [phone] --  [facility]  Pending - Request Sent N/A [address] [place] [address] [phone] --  [facility]  Pending - Request Sent N/A [address] [place], [place] [address] [phone] --  [facility]  Pending - Request Sent N/A [address] [place] [address] [phone] --  [facility]  Pending - Request Sent N/A [address], [place] [address] [phone] --  [facility]  Pending - Request Sent N/A [address] [place] [address] [phone] --  [facility]  Pending - Request Sent N/A [address] [place] [address] [phone] --  [facility]  Pending - Request Sent N/A [address] [place] [address] [phone] --  [facility]  Pending - Request Sent N/A [address] [place] [address] [phone] --  [facility]  Pending - Request Sent N/A [address] [place] [address] [phone] --  [facility]  Pending - Request Sent N/A [address] [place] [address] [phone] --  [facility]  Pending - Request Sent N/A [address] [place] [address] [phone] --  [facility]  Pending - Request Sent N/A [address] [place], [place] [address] [phone] --  [facility]  Pending - Request Sent N/A [address], [place] [address] [phone] --  [facility]  Pending - Request Sent N/A [address], [place] [address] [phone] --  [facility]  Pending - Request Sent N/A [address], [place] [address] [phone] --  [facility]  Pending - Request Sent N/A [address], [place] [address] [phone] --  [facility]  Pending - Request Sent N/A [address] [place], [place] [address] [phone] --    Crissie Reese, MSW, LCSW-A, LCAS-A Phone: [phone] Disposition/TOC

## 2021-04-30 NOTE — ED Provider Notes (Signed)
Emergency Medicine Provider Triage Evaluation Note  George Howe , a 35 y.o. male  was evaluated in triage.  Pt complains of suicidal thoughts.  He states that tonight he had been drinking and wanted to kill himself with a knife tonight.  States that he has been under a lot of stress.  Reports also being 6 weeks in to a new zoloft prescription.  Denies any drug use or recent illness..  Review of Systems  Positive: Suicidal thoughts, anxiety Negative: Fever, cough  Physical Exam  BP 133/86 (BP Location: Right Arm)   Pulse 97   Temp 98.5 F (36.9 C) (Oral)   Resp 18   Ht 5\' 2"  (1.575 m)   Wt 97.5 kg   SpO2 97%   BMI 39.32 kg/m  Gen:   Awake, no distress   Resp:  Normal effort  MSK:   Moves extremities without difficulty  Other:    Medical Decision Making  Medically screening exam initiated at 5:00 AM.  Appropriate orders placed.  Hamad Whyte was informed that the remainder of the evaluation will be completed by another provider, this initial triage assessment does not replace that evaluation, and the importance of remaining in the ED until their evaluation is complete.  Suicidal thoughts.   [name], PA-C 04/30/21 0502    06/30/21, MD 04/30/21 [phone]

## 2021-04-30 NOTE — ED Triage Notes (Signed)
Pt reported to ED with c/o attempted suicide to night by using a knife. Reports recent life stressors and new prescription for Zoloft.

## 2021-04-30 NOTE — ED Notes (Signed)
Called [employer] for transport for patient to [facility]
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
## 2021-05-01 DIAGNOSIS — F332 Major depressive disorder, recurrent severe without psychotic features: Secondary | ICD-10-CM | POA: Diagnosis not present

## 2021-05-01 DIAGNOSIS — F411 Generalized anxiety disorder: Secondary | ICD-10-CM | POA: Diagnosis present

## 2021-05-01 LAB — GLUCOSE, CAPILLARY
Glucose-Capillary: 122 mg/dL — ABNORMAL HIGH (ref 70–99)
Glucose-Capillary: 185 mg/dL — ABNORMAL HIGH (ref 70–99)
Glucose-Capillary: 163 mg/dL — ABNORMAL HIGH (ref 70–99)
Glucose-Capillary: 168 mg/dL — ABNORMAL HIGH (ref 70–99)

## 2021-05-01 MED ORDER — TRAZODONE HCL 50 MG PO TABS
50.0000 mg | ORAL_TABLET | Freq: Every evening | ORAL | Status: DC | PRN
  Administered 2021-05-05: 50 mg via ORAL
  Filled 2021-05-01: qty 1

## 2021-05-01 MED ORDER — INSULIN GLARGINE 100 UNIT/ML ~~LOC~~ SOLN
30.0000 [IU] | Freq: Every day | SUBCUTANEOUS | Status: DC
  Administered 2021-05-01 – 2021-05-08 (×7): 30 [IU] via SUBCUTANEOUS
  Filled 2021-05-01 (×2): qty 0.3

## 2021-05-01 MED ORDER — SERTRALINE HCL 50 MG PO TABS
50.0000 mg | ORAL_TABLET | Freq: Once | ORAL | Status: AC
  Administered 2021-05-01: 50 mg via ORAL
  Filled 2021-05-01: qty 1

## 2021-05-01 MED ORDER — TRAZODONE HCL 100 MG PO TABS
100.0000 mg | ORAL_TABLET | Freq: Every day | ORAL | Status: DC
  Administered 2021-05-01 – 2021-05-05 (×4): 100 mg via ORAL
  Filled 2021-05-01 (×4): qty 1
  Filled 2021-05-01: qty 7
  Filled 2021-05-01 (×5): qty 1

## 2021-05-01 MED ORDER — NICOTINE 21 MG/24HR TD PT24
21.0000 mg | MEDICATED_PATCH | Freq: Every day | TRANSDERMAL | Status: DC
  Administered 2021-05-01 – 2021-05-09 (×9): 21 mg via TRANSDERMAL
  Filled 2021-05-01 (×12): qty 1

## 2021-05-01 MED ORDER — SERTRALINE HCL 100 MG PO TABS
100.0000 mg | ORAL_TABLET | Freq: Every day | ORAL | Status: DC
  Filled 2021-05-01 (×2): qty 1

## 2021-05-01 NOTE — Progress Notes (Signed)
D:  Patient's self inventory sheet, patient has fair sleep, sleep medication helpful.  Good appetite, normal energy level, good concentration.  Denied depression and hopeless, anxiety #5.  Denied SI.  Denied withdrawals.  Denied physical pain.  Goal is suicide safety plan.  Plans to complete suicide safety plan.  No discharge plans presently.   A:  Medications administered per MD orders.  Emotional support and encouragement given patient. R:  Denied SI and HI, contracts for safety.  Denied A/V hallucinations.  Safety maintained with 15 minute checks.

## 2021-05-01 NOTE — Plan of Care (Signed)
Nurse discussed coping skills with patient.  

## 2021-05-01 NOTE — BHH Group Notes (Signed)
Occupational Therapy Group Note Date: 05/01/2021 Group Topic/Focus: Coping Skills  Group Description: Group Description: Group encouraged increased engagement and participation through discussion focused on coping through music. Group members were encouraged to create a "coping skills playlist" that consisted of 20 songs with different cited categories/topics including "a song that reminds you of a good memory" and "a song that makes you want to dance", etc. Discussion followed with patients sharing their playlists and choosing one for the group to listen and respond too.   Therapeutic Goal(s): Identify positive songs to improve coping through music Identify and share benefit of music as a coping strategy  Participation Level: Patient did not attend OT group session despite personal invitation from M[facility].  Plan: Continue to engage patient in OT groups 2 - 3x/week.  05/01/2021  Hershy Flenner, MOT, OTR/L  

## 2021-05-01 NOTE — Tx Team (Signed)
Interdisciplinary Treatment and Diagnostic Plan Update  05/01/2021 Time of Session: 9:45am George Howe MRN: [id number]  Principal Diagnosis: <principal problem not specified>  Secondary Diagnoses: Active Problems:   MDD (major depressive disorder), recurrent severe, without psychosis (George Howe)   Current Medications:  Current Facility-Administered Medications  Medication Dose Route Frequency Provider Last Rate Last Admin   acetaminophen (TYLENOL) tablet 650 mg  650 mg Oral Q6H PRN Mallie Darting, NP       alum & mag hydroxide-simeth (MAALOX/MYLANTA) 200-200-20 MG/5ML suspension 30 mL  30 mL Oral Q4H PRN Mallie Darting, NP       atorvastatin (LIPITOR) tablet 40 mg  40 mg Oral QHS Merlyn Lot E, NP   40 mg at 04/30/21 2132   hydrOXYzine (ATARAX/VISTARIL) tablet 25 mg  25 mg Oral TID PRN Mallie Darting, NP       insulin aspart (novoLOG) injection 0-15 Units  0-15 Units Subcutaneous TID WC Merlyn Lot E, NP   2 Units at 05/01/21 4098   insulin aspart (novoLOG) injection 0-5 Units  0-5 Units Subcutaneous QHS Merlyn Lot E, NP   2 Units at 04/30/21 2134   insulin glargine-yfgn (SEMGLEE) injection 30 Units  30 Units Subcutaneous QHS Ethelene Hal, NP   30 Units at 04/30/21 2135   loperamide (IMODIUM) capsule 2-4 mg  2-4 mg Oral PRN Ethelene Hal, NP       loratadine (CLARITIN) tablet 10 mg  10 mg Oral Daily Merlyn Lot E, NP   10 mg at 05/01/21 0900   LORazepam (ATIVAN) tablet 1 mg  1 mg Oral Q6H PRN Ethelene Hal, NP       multivitamin with minerals tablet 1 tablet  1 tablet Oral Daily Ethelene Hal, NP   1 tablet at 05/01/21 0900   nicotine (NICODERM CQ - dosed in mg/24 hours) patch 21 mg  21 mg Transdermal Once Merlyn Lot E, NP       nicotine (NICODERM CQ - dosed in mg/24 hours) patch 21 mg  21 mg Transdermal Daily Nelda Marseille, Amy E, MD       ondansetron (ZOFRAN-ODT) disintegrating tablet 4 mg  4 mg Oral Q6H PRN Ethelene Hal, NP        sertraline (ZOLOFT) tablet 50 mg  50 mg Oral Daily Merlyn Lot E, NP   50 mg at 05/01/21 0900   thiamine tablet 100 mg  100 mg Oral Daily Ethelene Hal, NP   100 mg at 05/01/21 0900   traZODone (DESYREL) tablet 50 mg  50 mg Oral QHS,MR X 1 Ethelene Hal, NP   50 mg at 04/30/21 2232   PTA Medications: Medications Prior to Admission  Medication Sig Dispense Refill Last Dose   atorvastatin (LIPITOR) 40 MG tablet Take 1 tablet (40 mg total) by mouth daily. (Patient taking differently: Take 40 mg by mouth at bedtime.) 30 tablet 2    blood glucose meter kit and supplies KIT 1 each. Check blood sugar before meals and at bedtime      cetirizine (ZYRTEC) 10 MG tablet Take 10 mg by mouth daily as needed for allergies.      ibuprofen (ADVIL) 200 MG tablet Take 800 mg by mouth every 6 (six) hours as needed for headache or moderate pain.      insulin aspart (NOVOLOG) 100 UNIT/ML FlexPen Inject 4 units with each meal. Additionally, add 3 units for each 40 points above 140. 12 mL 6    insulin glargine (LANTUS)  100 UNIT/ML Solostar Pen Inject 30 Units into the skin at bedtime. 21 mL 2    Insulin Pen Needle (PEN NEEDLES) 32G X 4 MM MISC Use 1 pen needle to inject insulin 4 times per day. 200 each 11    Multiple Vitamins-Minerals (MULTIVITAMIN WITH MINERALS) tablet Take 1 tablet by mouth daily.      sertraline (ZOLOFT) 50 MG tablet Take 1 tablet (50 mg total) by mouth daily. 90 tablet 1     Patient Stressors: Financial difficulties Health problems  Patient Strengths: Average or above average intelligence Capable of independent living Communication skills Motivation for treatment/growth Supportive family/friends Work skills  Treatment Modalities: Medication Management, Group therapy, Case management,  1 to 1 session with clinician, Psychoeducation, Recreational therapy.   Physician Treatment Plan for Primary Diagnosis: <principal problem not specified> Long Term Goal(s):     Short  Term Goals:    Medication Management: Evaluate patient's response, side effects, and tolerance of medication regimen.  Therapeutic Interventions: 1 to 1 sessions, Unit Group sessions and Medication administration.  Evaluation of Outcomes: Not Met  Physician Treatment Plan for Secondary Diagnosis: Active Problems:   MDD (major depressive disorder), recurrent severe, without psychosis (George Howe)  Long Term Goal(s):     Short Term Goals:       Medication Management: Evaluate patient's response, side effects, and tolerance of medication regimen.  Therapeutic Interventions: 1 to 1 sessions, Unit Group sessions and Medication administration.  Evaluation of Outcomes: Not Met   RN Treatment Plan for Primary Diagnosis: <principal problem not specified> Long Term Goal(s): Knowledge of disease and therapeutic regimen to maintain health will improve  Short Term Goals: Ability to remain free from injury will improve, Ability to verbalize frustration and anger appropriately will improve, Ability to demonstrate self-control, Ability to identify and develop effective coping behaviors will improve, and Compliance with prescribed medications will improve  Medication Management: RN will administer medications as ordered by provider, will assess and evaluate patient's response and provide education to patient for prescribed medication. RN will report any adverse and/or side effects to prescribing provider.  Therapeutic Interventions: 1 on 1 counseling sessions, Psychoeducation, Medication administration, Evaluate responses to treatment, Monitor vital signs and CBGs as ordered, Perform/monitor CIWA, COWS, AIMS and Fall Risk screenings as ordered, Perform wound care treatments as ordered.  Evaluation of Outcomes: Not Met   LCSW Treatment Plan for Primary Diagnosis: <principal problem not specified> Long Term Goal(s): Safe transition to appropriate next level of care at discharge, Engage patient in  therapeutic group addressing interpersonal concerns.  Short Term Goals: Engage patient in aftercare planning with referrals and resources, Increase social support, Increase ability to appropriately verbalize feelings, Identify triggers associated with mental health/substance abuse issues, and Increase skills for wellness and recovery  Therapeutic Interventions: Assess for all discharge needs, 1 to 1 time with Social worker, Explore available resources and support systems, Assess for adequacy in community support network, Educate family and significant other(s) on suicide prevention, Complete Psychosocial Assessment, Interpersonal group therapy.  Evaluation of Outcomes: Not Met   Progress in Treatment: Attending groups: No. Participating in groups: No. Taking medication as prescribed: Yes. Toleration medication: Yes. Family/Significant other contact made: No, will contact:  if consent is provided Patient understands diagnosis: Yes. Discussing patient identified problems/goals with staff: Yes. Medical problems stabilized or resolved: Yes. Denies suicidal/homicidal ideation: Yes. Issues/concerns per patient self-inventory: No.   New problem(s) identified: No, Describe:  none  New Short Term/Long Term Goal(s): medication stabilization, elimination of SI thoughts,  development of comprehensive mental wellness plan.    Patient Goals:  "To be able to cope and learn to recognize when I need help"  Discharge Plan or Barriers: Patient recently admitted. CSW will continue to follow and assess for appropriate referrals and possible discharge planning.    Reason for Continuation of Hospitalization: Anxiety Depression Medication stabilization Suicidal ideation  Estimated Length of Stay: 3-5 days  Attendees: Patient: George Howe 05/01/2021   Physician: Fatima Sanger, DO 05/01/2021   Nursing:  05/01/2021   RN Care Manager: 05/01/2021   Social Worker: Darletta Moll, LCSW 05/01/2021    Recreational Therapist:  05/01/2021   Other:  05/01/2021   Other:  05/01/2021   Other: 05/01/2021     Scribe for Treatment Team: Vassie Moselle, LCSW 05/01/2021 10:17 AM

## 2021-05-01 NOTE — [facility] Suicide Risk Assessment (Signed)
[facility] INPATIENT:  Family/Significant Other Suicide Prevention Education  Suicide Prevention Education:  Education Completed; [name], friend  (name of family member/significant other) has been identified by the patient as the family member/significant other with whom the patient will be residing, and identified as the person(s) who will aid the patient in the event of a mental health crisis (suicidal ideations/suicide attempt).  With written consent from the patient, the family member/significant other has been provided the following suicide prevention education, prior to the and/or following the discharge of the patient.  CSW spoke with patient friend Tammy Sours. 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  Tammy Sours reports that patient and him recovered from that business attempt 7 years after they had to close the business.  Tammy Sours reports that patient had tried to start another business and after some unforeseen circumstances he has had to double his debt and learned that his investor could no longer invest any more money. Tammy Sours also reports that in May patient was diagnosed and hospitalized with diabetes and that his father also died from diabetes which hit patient hard. Tammy Sours reports that he checks in regularly and he received call at 3am in the morning with patient needing help regarding suicidal ideation. To friend knowledge, patient has never had a previous attempt, just fleeting suicidal ideation.    Tammy Sours reports that he has been over to patient living space and has been getting rid of knives and other sharp objects. Tammy Sours reports no weapons in the home.  Tammy Sours was educated about crisis resources.  Tammy Sours also discussed ability to pick patient up from hospital upon discharge.   The suicide prevention education provided includes the following: Suicide risk factors Suicide prevention and interventions National Suicide Hotline telephone number [facility] assessment telephone number [facility] Emergency Assistance 911 [facility] and/or Residential Mobile Crisis Unit telephone number  Request made of family/significant other to: Remove weapons (e.g., guns, rifles, knives), all items previously/currently identified as safety concern.   Remove drugs/medications (over-the-counter, prescriptions, illicit drugs), all items previously/currently identified as a safety concern.  The family member/significant other verbalizes understanding of the suicide prevention education information provided.  The family member/significant other agrees to remove the items of safety concern listed above.  Beckey Polkowski E Thomes Burak 05/01/2021, 3:54 PM
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
## 2021-05-01 NOTE — Progress Notes (Signed)
Inpatient Diabetes Program Recommendations  AACE/ADA: New Consensus Statement on Inpatient Glycemic Control (2015)  Target Ranges:  Prepandial:   less than 140 mg/dL      Peak postprandial:   less than 180 mg/dL (1-2 hours)      Critically ill patients:  140 - 180 mg/dL   Lab Results  Component Value Date   GLUCAP 122 (H) 05/01/2021   HGBA1C 6.5 (H) 04/30/2021    Review of Glycemic Control  Diabetes history: DM2 Outpatient Diabetes medications: Novolog 4 units TID + 3 additional units for each 40 mg/dL above 453 mg/dL, Lantus 30 QHS Current orders for Inpatient glycemic control: Semglee 30 QHS, Novolog 0-15 units TID with meals and 0-5 HS  Glucose 122 this am.  Inpatient Diabetes Program Recommendations:    Agree with orders.  Will likely need meal coverage insulin  Continue to follow.  Thank you. Ailene Ards, RD, LDN, CDE Inpatient Diabetes Coordinator [phone]

## 2021-05-01 NOTE — [facility] Group Notes (Signed)
[facility] Group Notes:  (Nursing/MHT/Case Management/Adjunct)  Date:  05/01/2021  Time:  11:17 PM  Type of Therapy:  Group Therapy  Participation Level:  Active  Participation Quality:  Attentive  Affect:  Appropriate  Cognitive:  Appropriate  Insight:  Improving  Engagement in Group:  Developing/Improving  Modes of Intervention:  Discussion  Summary of Progress/Problems:  George Howe 05/01/2021, 11:17 PM

## 2021-05-01 NOTE — Progress Notes (Signed)
Adult Psychoeducational Group Note  Date:  05/01/2021 Time:  10:54 AM  Group Topic/Focus:  Goals Group:   The focus of this group is to help patients establish daily goals to achieve during treatment and discuss how the patient can incorporate goal setting into their daily lives to aide in recovery.  Participation Level:  Minimal  Participation Quality:  Appropriate  Affect:  Anxious and Appropriate  Cognitive:  Appropriate  Insight: Good  Engagement in Group:  Limited  Modes of Intervention:  Discussion  Additional Comments:  Pt has a goal of completing the suicide safety plan.   George Howe 05/01/2021, 10:54 AM
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
## 2021-05-01 NOTE — [facility] Group Notes (Signed)
LCSW Group Therapy Note  05/01/2021 2:45pm  Type of Therapy and Topic:  Group Therapy - Healthy vs Unhealthy Coping Skills  Participation Level:  Did Not Attend   Description of Group The focus of this group was to determine what unhealthy coping techniques typically are used by group members and what healthy coping techniques would be helpful in coping with various problems. Patients were guided in becoming aware of the differences between healthy and unhealthy coping techniques. Patients were asked to identify 2-3 healthy coping skills they would like to learn to use more effectively, and many mentioned meditation, breathing, and relaxation. These were explained, samples demonstrated, and resources shared for how to learn more at discharge. At group closing, additional ideas of healthy coping skills were shared in a fun exercise.  Therapeutic Goals Patients learned that coping is what human beings do all day long to deal with various situations in their lives Patients defined and discussed healthy vs unhealthy coping techniques Patients identified their preferred coping techniques and identified whether these were healthy or unhealthy Patients determined 2-3 healthy coping skills they would like to become more familiar with and use more often, and practiced a few medications Patients provided support and ideas to each other     Therapeutic Modalities Cognitive Behavioral Therapy Motivational Interviewing  Fredirick Lathe, LCSWA Clinicial Social Worker [employer]
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
## 2021-05-02 ENCOUNTER — Inpatient Hospital Stay (HOSPITAL_COMMUNITY): Payer: 59

## 2021-05-02 ENCOUNTER — Encounter (HOSPITAL_COMMUNITY): Payer: Self-pay | Admitting: Emergency Medicine

## 2021-05-02 DIAGNOSIS — F332 Major depressive disorder, recurrent severe without psychotic features: Secondary | ICD-10-CM | POA: Diagnosis not present

## 2021-05-02 LAB — URINALYSIS, COMPLETE (UACMP) WITH MICROSCOPIC
Bilirubin Urine: NEGATIVE
Glucose, UA: >=500 mg/dL — AB
Hgb urine dipstick: NEGATIVE
Ketones, ur: NEGATIVE mg/dL
Leukocytes,Ua: NEGATIVE
Nitrite: NEGATIVE
Protein, ur: NEGATIVE mg/dL
Specific Gravity, Urine: 1.016 (ref 1.005–1.030)
pH: 5 (ref 5.0–8.0)

## 2021-05-02 LAB — CBC WITH DIFFERENTIAL/PLATELET
Abs Immature Granulocytes: 0.03 10*3/uL (ref 0.00–0.07)
Basophils Absolute: 0 10*3/uL (ref 0.0–0.1)
Basophils Relative: 0 %
Eosinophils Absolute: 0.1 10*3/uL (ref 0.0–0.5)
Eosinophils Relative: 1 %
HCT: 44.7 % (ref 39.0–52.0)
Hemoglobin: 15 g/dL (ref 13.0–17.0)
Immature Granulocytes: 0 %
Lymphocytes Relative: 7 %
Lymphs Abs: 0.6 10*3/uL — ABNORMAL LOW (ref 0.7–4.0)
MCH: 28.4 pg (ref 26.0–34.0)
MCHC: 33.6 g/dL (ref 30.0–36.0)
MCV: 84.7 fL (ref 80.0–100.0)
Monocytes Absolute: 0.8 10*3/uL (ref 0.1–1.0)
Monocytes Relative: 10 %
Neutro Abs: 6.9 10*3/uL (ref 1.7–7.7)
Neutrophils Relative %: 82 %
Platelets: 179 10*3/uL (ref 150–400)
RBC: 5.28 MIL/uL (ref 4.22–5.81)
RDW: 12.6 % (ref 11.5–15.5)
WBC: 8.3 10*3/uL (ref 4.0–10.5)
nRBC: 0 % (ref 0.0–0.2)

## 2021-05-02 LAB — COMPREHENSIVE METABOLIC PANEL
ALT: 126 U/L — ABNORMAL HIGH (ref 0–44)
AST: 64 U/L — ABNORMAL HIGH (ref 15–41)
Albumin: 4.2 g/dL (ref 3.5–5.0)
Alkaline Phosphatase: 72 U/L (ref 38–126)
Anion gap: 13 (ref 5–15)
BUN: 12 mg/dL (ref 6–20)
CO2: 24 mmol/L (ref 22–32)
Calcium: 8.9 mg/dL (ref 8.9–10.3)
Chloride: 101 mmol/L (ref 98–111)
Creatinine, Ser: 0.98 mg/dL (ref 0.61–1.24)
GFR, Estimated: >60 mL/min (ref >60)
Glucose, Bld: 131 mg/dL — ABNORMAL HIGH (ref 70–99)
Potassium: 3.7 mmol/L (ref 3.5–5.1)
Sodium: 138 mmol/L (ref 135–145)
Total Bilirubin: 1.3 mg/dL — ABNORMAL HIGH (ref 0.3–1.2)
Total Protein: 6.8 g/dL (ref 6.5–8.1)

## 2021-05-02 LAB — GLUCOSE, CAPILLARY
Glucose-Capillary: 152 mg/dL — ABNORMAL HIGH (ref 70–99)
Glucose-Capillary: 135 mg/dL — ABNORMAL HIGH (ref 70–99)
Glucose-Capillary: 176 mg/dL — ABNORMAL HIGH (ref 70–99)

## 2021-05-02 LAB — RESP PANEL BY RT-PCR (FLU A&B, COVID) ARPGX2
Influenza A by PCR: NEGATIVE
Influenza B by PCR: NEGATIVE
SARS Coronavirus 2 by RT PCR: POSITIVE — AB

## 2021-05-02 LAB — LIPID PANEL
Cholesterol: 118 mg/dL (ref 0–200)
HDL: 36 mg/dL — ABNORMAL LOW (ref >40)
LDL Cholesterol: 53 mg/dL (ref 0–99)
Total CHOL/HDL Ratio: 3.3 RATIO
Triglycerides: 144 mg/dL (ref <150)
VLDL: 29 mg/dL (ref 0–40)

## 2021-05-02 LAB — TSH: TSH: 0.74 u[IU]/mL (ref 0.350–4.500)

## 2021-05-02 MED ORDER — SERTRALINE HCL 50 MG PO TABS
50.0000 mg | ORAL_TABLET | Freq: Every day | ORAL | Status: DC
  Administered 2021-05-03 – 2021-05-08 (×6): 50 mg via ORAL
  Filled 2021-05-02 (×2): qty 1
  Filled 2021-05-02: qty 7
  Filled 2021-05-02 (×5): qty 1

## 2021-05-02 NOTE — Progress Notes (Signed)
   05/02/21 0040  Psych Admission Type (Psych Patients Only)  Admission Status Voluntary  Psychosocial Assessment  Patient Complaints Anxiety;Insomnia  Eye Contact Fair  Facial Expression Animated  Affect Appropriate to circumstance  Speech Logical/coherent  Interaction Assertive  Motor Activity Other (Comment) (WDL)  Appearance/Hygiene Unremarkable  Behavior Characteristics Cooperative  Mood Pleasant  Thought Process  Coherency WDL  Content WDL  Delusions None reported or observed  Perception WDL  Hallucination None reported or observed  Judgment Impaired  Confusion None  Danger to Self  Current suicidal ideation? Denies  Danger to Others  Danger to Others None reported or observed

## 2021-05-02 NOTE — ED Provider Notes (Signed)
Emergency Medicine Provider Triage Evaluation Note  George Howe , a 35 y.o. male  was evaluated in triage.  Pt complains of who presents from [facility] requesting chest x-ray.  Patient was diagnosed with COVID today endorses generalized body aches and headache some very mild chest tightness but denies any cough, shortness of breath, or chest pain..  Review of Systems  Positive: Headache, myalgias, chills, fevers Negative: Nausea, vomiting, diarrhea, syncope  Physical Exam  BP (!) 142/95 (BP Location: Right Arm)   Pulse (!) 102   Temp 99.2 F (37.3 C) (Oral)   Resp 18   Ht 5\' 2"  (1.575 m)   Wt 97.5 kg   SpO2 98%   BMI 39.32 kg/m  Gen:   Awake, no distress   Resp:  Normal effort  MSK:   Moves extremities without difficulty  Other:  RRR no M/R/B.  Lung CTA B.  Medical Decision Making  Medically screening exam initiated at 7:22 PM.  Appropriate orders placed.  George Howe was informed that the remainder of the evaluation will be completed by another provider, this initial triage assessment does not replace that evaluation, and the importance of remaining in the ED until their evaluation is complete.  Patient to be returned back to the [facility] after reassuring x-ray.  This chart was dictated using voice recognition software, Dragon. Despite the best efforts of this provider to proofread and correct errors, errors may still occur which can change documentation meaning.    [facility] 05/02/21 07/02/21    Margretta Ditty, MD 05/03/21 1454

## 2021-05-02 NOTE — Progress Notes (Signed)
[facility] MD Progress Note  05/02/2021 4:30 PM George Howe  MRN:  [id number]  Reason for admission:  George Howe is a 35 year old male with a history of diabetes (recently diagnosed in April 2022), history of pancreatitis, hyperlipidemia, childhood trauma, GAD and MDD brought in by a friend to [facility] after a near suicide attempt in the context of stressors of recent diagnosis of DM, recent failed business venture, financial stressors and alcohol use on the evening of near attempt.  Patient reported that he was contemplating cutting his throat with a large knife and had the knife in his hand but did not cut himself because of his dog and instead called a friend for help who brought him to the ED.  Objective: Medical record reviewed.  Patient's case discussed in detail with members of the treatment team.  I met with and evaluated the patient on the unit for follow-up today.  Is cooperative and polite but presents with flushed face and reports headache, body aches and shivering/chills overnight which limited the amount of sleep he got.  He experiences ongoing anxiety and low mood.  There is no tremor observable on exam.  Patient denied nausea, vomiting, diarrhea, constipation, sore throat, change in appetite, loss of sense of smell, cough or trouble breathing this morning.  He denied passive wish for death, SI, HI, AI, AH or VH.  I encouraged him to drink p.o. fluids as much as possible.  He does not appear to be in alcohol withdrawal.  SARS Coronavirus 2 testing performed this morning was positive and patient has been placed on isolation precautions in his room with airborne and contact precautions.  Repeat CBC and differential drawn this morning revealed absolute lymphocyte count of 600 but was otherwise WNL.  Repeat CMP revealed glucose of 131, AST of 64, ALT of 126 and total bili of 1.3 but otherwise WNL.  Lipid profile revealed HDL of 36 and otherwise WNL.  TSH was 0.740.  Capillary blood glucose readings over  the last 24 hours ranged from a low of 135 to a high of 185.  Chest x-ray has been ordered but not yet performed.  Urinalysis with microscopic urine was ordered and specimen has been collected but results not yet available.  The patient slept 6 hours last night.  Vital signs this morning included BP of 95/56 sitting and 154/125 standing; pulse of 109 sitting and 120 standing; O2 sat of 99% and temperature of 100.9 was T-max for the past 24 hours.  Repeat vital signs performed at 10 AM this morning included temperature of 99.6, BP of 108/76, respirations of 18 and pulse of 123.  CIWA score at noon today was 1 (patient scored for headache).  I saw patient again this afternoon at about 5:15 PM.  He was sleeping quietly in his room when I arrived to see him but awakened to speak with me when I called his name.  Patient was alert and oriented and in no acute distress.  There is no diaphoresis or tremor observable on exam.  He denies chills, fever or myalgias since last night.  He continues to experience headache, fatigue and significant anxiety.  He denies any symptoms of alcohol withdrawal and again states that he has not been drinking with any regularity prior to admission.  Patient states he experienced nasal congestion this morning which has now resolved.  He denies nausea, vomiting, diarrhea, cough, shortness of breath, other trouble breathing, sore throat or other physical symptoms.  Patient reports he has been  able to take p.o. fluids and was able to eat lunch.    Principal Problem: MDD (major depressive disorder), recurrent severe, without psychosis ([place]) Diagnosis: Principal Problem:   MDD (major depressive disorder), recurrent severe, without psychosis ([place]) Active Problems:   GAD (generalized anxiety disorder)  Total Time spent with patient:  25 minutes  Past Psychiatric History: See admission H&P  Past Medical History:  Past Medical History:  Diagnosis Date   Chicken pox    Depression     Diabetes mellitus without complication ([place])    DM (diabetes mellitus) ([place])    Hay fever    Hyperlipidemia    Hyperlipidemia    Morbid obesity ([place])    History reviewed. No pertinent surgical history. Family History:  Family History  Problem Relation Age of Onset   Diabetes Father    Diabetes Paternal Grandmother    Family Psychiatric  History: See admission H&P Social History:  Social History   Substance and Sexual Activity  Alcohol Use Yes   Comment: occasional     Social History   Substance and Sexual Activity  Drug Use Yes    Social History   Socioeconomic History   Marital status: Single    Spouse name: Not on file   Number of children: Not on file   Years of education: Not on file   Highest education level: Not on file  Occupational History   Not on file  Tobacco Use   Smoking status: Every Day    Packs/day: 1.00    Years: 17.00    Pack years: 17.00    Types: Cigarettes   Smokeless tobacco: Never  Vaping Use   Vaping Use: Never used  Substance and Sexual Activity   Alcohol use: Yes    Comment: occasional   Drug use: Yes   Sexual activity: Yes  Other Topics Concern   Not on file  Social History Narrative   Not on file   Social Determinants of Health   Financial Resource Strain: Not on file  Food Insecurity: Not on file  Transportation Needs: Not on file  Physical Activity: Not on file  Stress: Not on file  Social Connections: Not on file   Additional Social History:                         Sleep: Poor  Appetite:  Good  Current Medications: Current Facility-Administered Medications  Medication Dose Route Frequency Provider Last Rate Last Admin   acetaminophen (TYLENOL) tablet 650 mg  650 mg Oral Q6H PRN Merlyn Lot E, NP   650 mg at 05/02/21 1014   alum & mag hydroxide-simeth (MAALOX/MYLANTA) 200-200-20 MG/5ML suspension 30 mL  30 mL Oral Q4H PRN Merlyn Lot E, NP       atorvastatin (LIPITOR) tablet 40 mg  40 mg Oral QHS  Merlyn Lot E, NP   40 mg at 05/01/21 2153   hydrOXYzine (ATARAX/VISTARIL) tablet 25 mg  25 mg Oral TID PRN Merlyn Lot E, NP   25 mg at 05/02/21 1015   insulin aspart (novoLOG) injection 0-15 Units  0-15 Units Subcutaneous TID WC Merlyn Lot E, NP   2 Units at 05/02/21 1300   insulin aspart (novoLOG) injection 0-5 Units  0-5 Units Subcutaneous QHS Merlyn Lot E, NP   2 Units at 04/30/21 2134   insulin glargine (LANTUS) injection 30 Units  30 Units Subcutaneous QHS Harlow Asa, MD   30 Units at 05/01/21 2155  loperamide (IMODIUM) capsule 2-4 mg  2-4 mg Oral PRN Ethelene Hal, NP       loratadine (CLARITIN) tablet 10 mg  10 mg Oral Daily Merlyn Lot E, NP   10 mg at 05/02/21 0900   LORazepam (ATIVAN) tablet 1 mg  1 mg Oral Q6H PRN Ethelene Hal, NP   1 mg at 05/02/21 0015   multivitamin with minerals tablet 1 tablet  1 tablet Oral Daily Ethelene Hal, NP   1 tablet at 05/02/21 0900   nicotine (NICODERM CQ - dosed in mg/24 hours) patch 21 mg  21 mg Transdermal Once Merlyn Lot E, NP       nicotine (NICODERM CQ - dosed in mg/24 hours) patch 21 mg  21 mg Transdermal Daily Singleton, Amy E, MD   21 mg at 05/02/21 0900   ondansetron (ZOFRAN-ODT) disintegrating tablet 4 mg  4 mg Oral Q6H PRN Ethelene Hal, NP   4 mg at 05/02/21 1015   [START ON 05/03/2021] sertraline (ZOLOFT) tablet 50 mg  50 mg Oral Daily Arthor Captain, MD       thiamine tablet 100 mg  100 mg Oral Daily Ethelene Hal, NP   100 mg at 05/02/21 0900   traZODone (DESYREL) tablet 100 mg  100 mg Oral QHS Arthor Captain, MD   100 mg at 05/01/21 2153   traZODone (DESYREL) tablet 50 mg  50 mg Oral QHS PRN Arthor Captain, MD        Lab Results:  Results for orders placed or performed during the hospital encounter of 04/30/21 (from the past 48 hour(s))  Glucose, capillary     Status: Abnormal   Collection Time: 04/30/21  5:03 PM  Result Value Ref Range   Glucose-Capillary 185 (H) 70  - 99 mg/dL    Comment: Glucose reference range applies only to samples taken after fasting for at least 8 hours.  Glucose, capillary     Status: Abnormal   Collection Time: 04/30/21  8:19 PM  Result Value Ref Range   Glucose-Capillary 214 (H) 70 - 99 mg/dL    Comment: Glucose reference range applies only to samples taken after fasting for at least 8 hours.  Glucose, capillary     Status: Abnormal   Collection Time: 05/01/21  5:48 AM  Result Value Ref Range   Glucose-Capillary 122 (H) 70 - 99 mg/dL    Comment: Glucose reference range applies only to samples taken after fasting for at least 8 hours.  Glucose, capillary     Status: Abnormal   Collection Time: 05/01/21 12:03 PM  Result Value Ref Range   Glucose-Capillary 185 (H) 70 - 99 mg/dL    Comment: Glucose reference range applies only to samples taken after fasting for at least 8 hours.   Comment 1 Notify RN    Comment 2 Document in Chart   Glucose, capillary     Status: Abnormal   Collection Time: 05/01/21  5:17 PM  Result Value Ref Range   Glucose-Capillary 163 (H) 70 - 99 mg/dL    Comment: Glucose reference range applies only to samples taken after fasting for at least 8 hours.   Comment 1 Notify RN    Comment 2 Document in Chart   Glucose, capillary     Status: Abnormal   Collection Time: 05/01/21  9:08 PM  Result Value Ref Range   Glucose-Capillary 168 (H) 70 - 99 mg/dL    Comment: Glucose reference range applies  only to samples taken after fasting for at least 8 hours.  Lipid panel     Status: Abnormal   Collection Time: 05/02/21  6:14 AM  Result Value Ref Range   Cholesterol 118 0 - 200 mg/dL   Triglycerides 144 <150 mg/dL   HDL 36 (L) >40 mg/dL   Total CHOL/HDL Ratio 3.3 RATIO   VLDL 29 0 - 40 mg/dL   LDL Cholesterol 53 0 - 99 mg/dL    Comment:        Total Cholesterol/HDL:CHD Risk Coronary Heart Disease Risk Table                     Men   Women  1/2 Average Risk   3.4   3.3  Average Risk       5.0   4.4  2 X  Average Risk   9.6   7.1  3 X Average Risk  23.4   11.0        Use the calculated Patient Ratio above and the CHD Risk Table to determine the patient's CHD Risk.        ATP III CLASSIFICATION (LDL):  <100     mg/dL   Optimal  100-129  mg/dL   Near or Above                    Optimal  130-159  mg/dL   Borderline  160-189  mg/dL   High  >190     mg/dL   Very High Performed at [facility] [address] [place], [place] [address]   Comprehensive metabolic panel     Status: Abnormal   Collection Time: 05/02/21  6:14 AM  Result Value Ref Range   Sodium 138 135 - 145 mmol/L   Potassium 3.7 3.5 - 5.1 mmol/L   Chloride 101 98 - 111 mmol/L   CO2 24 22 - 32 mmol/L   Glucose, Bld 131 (H) 70 - 99 mg/dL    Comment: Glucose reference range applies only to samples taken after fasting for at least 8 hours.   BUN 12 6 - 20 mg/dL   Creatinine, Ser 0.98 0.61 - 1.24 mg/dL   Calcium 8.9 8.9 - 10.3 mg/dL   Total Protein 6.8 6.5 - 8.1 g/dL   Albumin 4.2 3.5 - 5.0 g/dL   AST 64 (H) 15 - 41 U/L   ALT 126 (H) 0 - 44 U/L   Alkaline Phosphatase 72 38 - 126 U/L   Total Bilirubin 1.3 (H) 0.3 - 1.2 mg/dL   GFR, Estimated >60 >60 mL/min    Comment: (NOTE) Calculated using the CKD-EPI Creatinine Equation (2021)    Anion gap 13 5 - 15    Comment: Performed at [facility], [place] [address] [place], [place] [address]  CBC with Differential/Platelet     Status: Abnormal   Collection Time: 05/02/21  6:14 AM  Result Value Ref Range   WBC 8.3 4.0 - 10.5 K/uL   RBC 5.28 4.22 - 5.81 MIL/uL   Hemoglobin 15.0 13.0 - 17.0 g/dL   HCT 44.7 39.0 - 52.0 %   MCV 84.7 80.0 - 100.0 fL   MCH 28.4 26.0 - 34.0 pg   MCHC 33.6 30.0 - 36.0 g/dL   RDW 12.6 11.5 - 15.5 %   Platelets 179 150 - 400 K/uL   nRBC 0.0 0.0 - 0.2 %   Neutrophils Relative % 82 %   Neutro  Abs 6.9 1.7 - 7.7 K/uL   Lymphocytes Relative 7 %   Lymphs Abs 0.6 (L) 0.7 - 4.0 K/uL   Monocytes Relative 10 %    Monocytes Absolute 0.8 0.1 - 1.0 K/uL   Eosinophils Relative 1 %   Eosinophils Absolute 0.1 0.0 - 0.5 K/uL   Basophils Relative 0 %   Basophils Absolute 0.0 0.0 - 0.1 K/uL   Immature Granulocytes 0 %   Abs Immature Granulocytes 0.03 0.00 - 0.07 K/uL    Comment: Performed at [facility], [place] [address], [place], [place] [address]  TSH     Status: None   Collection Time: 05/02/21  6:14 AM  Result Value Ref Range   TSH 0.740 0.350 - 4.500 uIU/mL    Comment: Performed by a 3rd Generation assay with a functional sensitivity of <=0.01 uIU/mL. Performed at [facility], [place] [address] [place], [place] [address]   Glucose, capillary     Status: Abnormal   Collection Time: 05/02/21  6:34 AM  Result Value Ref Range   Glucose-Capillary 152 (H) 70 - 99 mg/dL    Comment: Glucose reference range applies only to samples taken after fasting for at least 8 hours.  Resp Panel by RT-PCR (Flu A&B, Covid) Nasopharyngeal Swab     Status: Abnormal   Collection Time: 05/02/21  7:11 AM   Specimen: Nasopharyngeal Swab; Nasopharyngeal(NP) swabs in vial transport medium  Result Value Ref Range   SARS Coronavirus 2 by RT PCR POSITIVE (A) NEGATIVE    Comment: RESULT CALLED TO, READ BACK BY AND VERIFIED WITH: BEATRICE J.ON 05/02/2021 @ 1051 BY MECIAL J. (NOTE) SARS-CoV-2 target nucleic acids are DETECTED.  The SARS-CoV-2 RNA is generally detectable in upper respiratory specimens during the acute phase of infection. Positive results are indicative of the presence of the identified virus, but do not rule out bacterial infection or co-infection with other pathogens not detected by the test. Clinical correlation with patient history and other diagnostic information is necessary to determine patient infection status. The expected result is Negative.  Fact Sheet for Patients: EntrepreneurPulse.com.au  Fact Sheet for Healthcare  Providers: IncredibleEmployment.be  This test is not yet approved or cleared by the [place] FDA and  has been authorized for detection and/or diagnosis of SARS-CoV-2 by FDA under an Emergency Use Authorization (EUA).  This EUA will remain in effect (meaning this  test can be used) for the duration of  the COVID-19 declaration under Section 564(b)(1) of the Act, 21 U.S.C. section 360bbb-3(b)(1), unless the authorization is terminated or revoked sooner.     Influenza A by PCR NEGATIVE NEGATIVE   Influenza B by PCR NEGATIVE NEGATIVE    Comment: (NOTE) The Xpert Xpress SARS-CoV-2/FLU/RSV plus assay is intended as an aid in the diagnosis of influenza from Nasopharyngeal swab specimens and should not be used as a sole basis for treatment. Nasal washings and aspirates are unacceptable for Xpert Xpress SARS-CoV-2/FLU/RSV testing.  Fact Sheet for Patients: EntrepreneurPulse.com.au  Fact Sheet for Healthcare Providers: IncredibleEmployment.be  This test is not yet approved or cleared by the [place] FDA and has been authorized for detection and/or diagnosis of SARS-CoV-2 by FDA under an Emergency Use Authorization (EUA). This EUA will remain in effect (meaning this test can be used) for the duration of the COVID-19 declaration under Section 564(b)(1) of the Act, 21 U.S.C. section 360bbb-3(b)(1), unless the authorization is terminated or revoked.  Performed at [facility], [place] [address]  [place], [place] [address]   Glucose, capillary     Status: Abnormal   Collection Time: 05/02/21 12:34 PM  Result Value Ref Range   Glucose-Capillary 135 (H) 70 - 99 mg/dL    Comment: Glucose reference range applies only to samples taken after fasting for at least 8 hours.    Blood Alcohol level:  Lab Results  Component Value Date   ETH 45 (H) 96/28/3662    Metabolic Disorder Labs: Lab Results  Component  Value Date   HGBA1C 6.5 (H) 04/30/2021   MPG 139.85 04/30/2021   MPG 301 01/07/2021   No results found for: PROLACTIN Lab Results  Component Value Date   CHOL 118 05/02/2021   TRIG 144 05/02/2021   HDL 36 (L) 05/02/2021   CHOLHDL 3.3 05/02/2021   VLDL 29 05/02/2021   LDLCALC 53 05/02/2021   LDLCALC 155 (H) 01/11/2021    Physical Findings: AIMS:  , ,  ,  ,    CIWA:  CIWA-Ar Total: 1 COWS:     Musculoskeletal: Strength & Muscle Tone: within normal limits Gait & Station: normal Patient leans: N/A  Psychiatric Specialty Exam:  Presentation  General Appearance: Appropriate for Environment  Eye Contact:Good  Speech:Clear and Coherent  Speech Volume:Normal  Handedness: No data recorded  Mood and Affect  Mood:Anxious  Affect:Constricted   Thought Process  Thought Processes:Coherent; Goal Directed  Descriptions of Associations:Intact  Orientation:Full (Time, Place and Person)  Thought Content:Logical; WDL  History of Schizophrenia/Schizoaffective disorder:No  Duration of Psychotic Symptoms:No data recorded Hallucinations:Hallucinations: None  Ideas of Reference:None  Suicidal Thoughts:Suicidal Thoughts: No  Homicidal Thoughts:Homicidal Thoughts: No   Sensorium  Memory:Immediate Good; Recent Good  Judgment:Good  Insight:Good   Executive Functions  Concentration:Good  Attention Span:Good  [place] of Knowledge:Good  Language:Good   Psychomotor Activity  Psychomotor Activity:Psychomotor Activity: Normal   Assets  Assets:Communication Skills; Desire for Improvement; Housing; Resilience; Social Support; Talents/Skills; Vocational/Educational   Sleep  Sleep:Sleep: Fair Number of Hours of Sleep: 6    Physical Exam: Physical Exam Vitals and nursing note reviewed.  Constitutional:      General: He is not in acute distress.    Appearance: He is not diaphoretic.  HENT:     Head: Normocephalic and atraumatic.   Cardiovascular:     Rate and Rhythm: Normal rate.  Pulmonary:     Effort: Pulmonary effort is normal.  Neurological:     General: No focal deficit present.     Mental Status: He is alert and oriented to person, place, and time.   Review of Systems  Constitutional:  Positive for chills and fever.  HENT:  Negative for sore throat.   Respiratory:  Negative for cough and shortness of breath.   Cardiovascular:  Negative for chest pain and palpitations.  Gastrointestinal:  Negative for constipation, diarrhea, nausea and vomiting.  Genitourinary: Negative.   Musculoskeletal:  Positive for myalgias.       Positive for chronic bilateral pain in hands   Skin:  Negative for rash.  Neurological:  Positive for headaches. Negative for dizziness and tremors.  Psychiatric/Behavioral:  Positive for depression. Negative for hallucinations and suicidal ideas. The patient is nervous/anxious and has insomnia.   Blood pressure 108/76, pulse (!) 123, temperature 99.6 F (37.6 C), temperature source Oral, resp. rate 18, height 5' 2" (1.575 m), weight 96.6 kg, SpO2 98 %. Body mass index is 38.96 kg/m.   Treatment Plan Summary: Daily contact with patient to assess and evaluate symptoms and progress  in treatment and Medication management  Continue IVC status.    COVID-19 infection -SARS Coronavirus 2 testing performed this morning was positive after fever present with a.m. vital signs  -Patient has been placed on isolation precautions in his room with airborne and contact precautions.  -CXR has been ordered. Staff aware and will transport patient for CXR today.  -Patient is being sent to the ED from [facility] this evening for chest x-ray, evaluation for possible COVID-pneumonia and evaluation for possible admission to [place] bed.  Patient has insulin-dependent diabetes, high BMI and probable comorbid but undiagnosed sleep apnea which could predispose him to greater  likelihood of complications from his HALPF-79 infection. -Repeat CBC and differential ordered for a.m. draw tomorrow -Push p.o. fluids -Elevate head of bed at night  Diabetes -Continue Lantus insulin 30 units at bedtime -Continue NovoLog sliding scale 3 times daily with meals and at bedtime -Continue CBG monitoring 3 times daily before meals and at bedtime  MDD/GAD -Continue Zoloft 50 mg daily for now.  Held off on increase in dose to 100 mg this morning so as not to exacerbate patient's headache and other physical symptoms from COVID-19 infection.  Plan to increase Zoloft to 100 mg daily once patient's headache and other symptoms from COVID infection have resolved. -Continue hydroxyzine 25 mg 3 times daily PRN anxiety  Insomnia -Continue trazodone 100 mg at bedtime -Continue trazodone 50 mg nightly PRN which may be given for sleep if standing dose trazodone not effective after 1 hour -Patient has possible comorbid undiagnosed sleep apnea.  I have discussed with patient that he should seek referral for sleep study from his PCP after discharge to assess for sleep apnea.  Seasonal allergies -Continue Claritin 10 mg daily  Hyperlipidemia -Continue Lipitor 40 mg daily  Patient remains on CIWA protocol with comfort meds and PRN lorazepam to cover for CIWA scores >12.  Suspect alcohol withdrawal is unlikely given patient's limited frequency and amount of alcohol consumption in recent years.  CIWA score at noon today was 1 (patient scored for headache only)  Urinalysis collected today and results still pending  Repeat CBC with differential and hepatic function panel ordered for a.m. draw tomorrow  Discharge planning in progress  Arthor Captain, MD 05/02/2021, 4:30 PM

## 2021-05-02 NOTE — Plan of Care (Signed)
Patient spiked fever of 100.9 this morning and reported symptoms of headache, myalgias, chills, shivering and diaphoresis.  COVID-19 testing was performed this morning and was positive.  Patient is being sent to the ED from [facility] for chest x-ray, evaluation for possible COVID-pneumonia and evaluation for possible admission to [facility] medical bed.  Patient has insulin-dependent diabetes, high BMI and probable comorbid but undiagnosed sleep apnea which could predispose him to greater likelihood of complications from his COVID-19 infection.

## 2021-05-02 NOTE — [facility] Group Notes (Signed)
Patient did not attend group.    Spiritual care group on grief and loss facilitated by chaplain Katy Shaquila Sigman, B[facility]   Group Goal:   Support / Education around grief and loss   Members engage in facilitated group support and psycho-social education.   Group Description:   Following introductions and group rules, group members engaged in facilitated group dialog and support around topic of loss, with particular support around experiences of loss in their lives. Group Identified types of loss (relationships / self / things) and identified patterns, circumstances, and changes that precipitate losses. Reflected on thoughts / feelings around loss, normalized grief responses, and recognized variety in grief experience. Group noted Worden's four tasks of grief in discussion.   Group drew on Adlerian / Rogerian, narrative, MI,    

## 2021-05-02 NOTE — ED Triage Notes (Addendum)
Patient here from [facility] reporting COVID +, requesting chest x-ray. Denies cough, chest pain.

## 2021-05-02 NOTE — Progress Notes (Signed)
@   [id number] Calling [facility] ED, for nurse to nurse report. Sending pt. For a chest x-ray to rule out Covid pneumonia, or for possible admission. Pt. Had a positive (PCR) Covid test. Pt. Had temp of 100.9 earlier with chest congestion.

## 2021-05-03 DIAGNOSIS — F332 Major depressive disorder, recurrent severe without psychotic features: Secondary | ICD-10-CM | POA: Diagnosis not present

## 2021-05-03 LAB — GLUCOSE, CAPILLARY
Glucose-Capillary: 157 mg/dL — ABNORMAL HIGH (ref 70–99)
Glucose-Capillary: 202 mg/dL — ABNORMAL HIGH (ref 70–99)
Glucose-Capillary: 190 mg/dL — ABNORMAL HIGH (ref 70–99)
Glucose-Capillary: 245 mg/dL — ABNORMAL HIGH (ref 70–99)
Glucose-Capillary: 176 mg/dL — ABNORMAL HIGH (ref 70–99)

## 2021-05-03 MED ORDER — IBUPROFEN 400 MG PO TABS: 400.0000 mg | ORAL_TABLET | Freq: Three times a day (TID) | ORAL | Status: DC | PRN

## 2021-05-03 MED ORDER — IBUPROFEN 400 MG PO TABS
400.0000 mg | ORAL_TABLET | Freq: Two times a day (BID) | ORAL | Status: DC | PRN
  Administered 2021-05-03 – 2021-05-05 (×4): 400 mg via ORAL
  Filled 2021-05-03 (×4): qty 1

## 2021-05-03 MED ORDER — ALBUTEROL SULFATE HFA 108 (90 BASE) MCG/ACT IN AERS
2.0000 | INHALATION_SPRAY | RESPIRATORY_TRACT | Status: DC | PRN
  Administered 2021-05-03 – 2021-05-04 (×2): 2 via RESPIRATORY_TRACT
  Filled 2021-05-03: qty 6.7

## 2021-05-03 NOTE — Progress Notes (Signed)
Adult Psychoeducational Group Note  Date:  05/03/2021 Time:  4:24 PM  Group Topic/Focus:  Building Self Esteem:   The Focus of this group is helping patients become aware of the effects of self-esteem on their lives, the things they and others do that enhance or undermine their self-esteem, seeing the relationship between their level of self-esteem and the choices they make and learning ways to enhance self-esteem.  Participation Level:  Did Not Attend Deforest Hoyles [facility] 05/03/2021, 4:24 PM

## 2021-05-03 NOTE — Progress Notes (Signed)
[facility] MD Progress Note  05/03/2021 4:40 PM George Howe  MRN:  [id number]  Reason for admission:  George Howe is a 35 year old male with a history of diabetes (recently diagnosed in April 2022), history of pancreatitis, hyperlipidemia, childhood trauma, GAD and MDD brought in by a friend to [facility] after a near suicide attempt in the context of stressors of recent diagnosis of DM, recent failed business venture, financial stressors and alcohol use on the evening of near attempt.  Patient reported that he was contemplating cutting his throat with a large knife and had the knife in his hand but did not cut himself because of his dog and instead called a friend for help who brought him to the ED.  Objective: Medical record reviewed.  Patient's case discussed in detail with members of the treatment team.  I met with and evaluated the patient on the unit for follow-up today.  Patient appears a bit improved today.  He reports anxious mood, displays appropriate affect and thought processes are organized and coherent.  Patient states that his mood is a bit better from yesterday but he continues to experience anxiety and feels a little depressed.  He would like to defer the increase in his Zoloft dose for another day or two until he feels physically better.  Patient denies AI, HI, SI, PI, AH or VH.  Patient reports that he physically feels better than he did yesterday.  He experienced chills, headache, sweating and myalgias last night but all were less severe than the previous night.  On evaluation with me today, he no longer has chills and diaphoresis but continues to have headache, myalgias, fatigue, anxiety and dizziness upon standing.  He denies nausea, vomiting, diarrhea, constipation, sore throat, cough, trouble breathing, chest pain, palpitations or other physical symptoms.  Patient requests ibuprofen for headache.  He has been drinking fluids and was able to eat dinner last night as well as lunch today.  He did  not sleep well last night since he spent much of his time in the ED.  Staff documented that patient slept only 1 hour last night but spent most of the night in the ED for chest x-ray and evaluation due to COVID infection.  Vital signs today include BP of 118/72 and pulse of 74.  He was afebrile this morning at 98.2.  Respirations were 17.  O2 sat was 100% on room air.  Capillary blood glucose readings for the past 24 hours ranged from a low of 135 to a high of 202.  Patient had CBC with differential and hepatic function panel ordered for the a.m. draw this morning but for some reason they were not drawn.  Chest x-ray performed in the ED last evening showed no active cardiopulmonary disease.  Principal Problem: MDD (major depressive disorder), recurrent severe, without psychosis ([place]) Diagnosis: Principal Problem:   MDD (major depressive disorder), recurrent severe, without psychosis ([place]) Active Problems:   GAD (generalized anxiety disorder)  Total Time spent with patient:  20 minutes  Past Psychiatric History: See admission H&P  Past Medical History:  Past Medical History:  Diagnosis Date   Chicken pox    Depression    Diabetes mellitus without complication ([place])    DM (diabetes mellitus) ([place])    Hay fever    Hyperlipidemia    Hyperlipidemia    Morbid obesity ([place])    History reviewed. No pertinent surgical history. Family History:  Family History  Problem Relation Age of Onset   Diabetes Father  Diabetes Paternal Grandmother    Family Psychiatric  History: See admission H&P Social History:  Social History   Substance and Sexual Activity  Alcohol Use Yes   Comment: occasional     Social History   Substance and Sexual Activity  Drug Use Yes    Social History   Socioeconomic History   Marital status: Single    Spouse name: Not on file   Number of children: Not on file   Years of education: Not on file   Highest education level: Not on file  Occupational History    Not on file  Tobacco Use   Smoking status: Every Day    Packs/day: 1.00    Years: 17.00    Pack years: 17.00    Types: Cigarettes   Smokeless tobacco: Never  Vaping Use   Vaping Use: Never used  Substance and Sexual Activity   Alcohol use: Yes    Comment: occasional   Drug use: Yes   Sexual activity: Yes  Other Topics Concern   Not on file  Social History Narrative   Not on file   Social Determinants of Health   Financial Resource Strain: Not on file  Food Insecurity: Not on file  Transportation Needs: Not on file  Physical Activity: Not on file  Stress: Not on file  Social Connections: Not on file   Additional Social History:                         Sleep: Poor  Appetite:  Good  Current Medications: Current Facility-Administered Medications  Medication Dose Route Frequency Provider Last Rate Last Admin   acetaminophen (TYLENOL) tablet 650 mg  650 mg Oral Q6H PRN Merlyn Lot E, NP   650 mg at 05/03/21 0341   albuterol (VENTOLIN HFA) 108 (90 Base) MCG/ACT inhaler 2 puff  2 puff Inhalation Q4H PRN Charlann Lange, PA-C   2 puff at 05/03/21 0311   alum & mag hydroxide-simeth (MAALOX/MYLANTA) 200-200-20 MG/5ML suspension 30 mL  30 mL Oral Q4H PRN Merlyn Lot E, NP       atorvastatin (LIPITOR) tablet 40 mg  40 mg Oral QHS Merlyn Lot E, NP   40 mg at 05/03/21 6659   hydrOXYzine (ATARAX/VISTARIL) tablet 25 mg  25 mg Oral TID PRN Merlyn Lot E, NP   25 mg at 05/03/21 1621   ibuprofen (ADVIL) tablet 400 mg  400 mg Oral BID PRN Lindell Spar I, NP   400 mg at 05/03/21 1612   insulin aspart (novoLOG) injection 0-15 Units  0-15 Units Subcutaneous TID WC Merlyn Lot E, NP   3 Units at 05/03/21 1219   insulin aspart (novoLOG) injection 0-5 Units  0-5 Units Subcutaneous QHS Merlyn Lot E, NP   2 Units at 04/30/21 2134   insulin glargine (LANTUS) injection 30 Units  30 Units Subcutaneous QHS Harlow Asa, MD   30 Units at 05/01/21 2155   loperamide (IMODIUM)  capsule 2-4 mg  2-4 mg Oral PRN Ethelene Hal, NP       loratadine (CLARITIN) tablet 10 mg  10 mg Oral Daily Merlyn Lot E, NP   10 mg at 05/03/21 0900   LORazepam (ATIVAN) tablet 1 mg  1 mg Oral Q6H PRN Ethelene Hal, NP   1 mg at 05/03/21 0341   multivitamin with minerals tablet 1 tablet  1 tablet Oral Daily Ethelene Hal, NP   1 tablet at 05/03/21 0900  nicotine (NICODERM CQ - dosed in mg/24 hours) patch 21 mg  21 mg Transdermal Once Merlyn Lot E, NP       nicotine (NICODERM CQ - dosed in mg/24 hours) patch 21 mg  21 mg Transdermal Daily Singleton, Amy E, MD   21 mg at 05/03/21 0900   ondansetron (ZOFRAN-ODT) disintegrating tablet 4 mg  4 mg Oral Q6H PRN Ethelene Hal, NP   4 mg at 05/02/21 1015   sertraline (ZOLOFT) tablet 50 mg  50 mg Oral Daily Arthor Captain, MD   50 mg at 05/03/21 0900   thiamine tablet 100 mg  100 mg Oral Daily Ethelene Hal, NP   100 mg at 05/03/21 0900   traZODone (DESYREL) tablet 100 mg  100 mg Oral QHS Arthor Captain, MD   100 mg at 05/01/21 2153   traZODone (DESYREL) tablet 50 mg  50 mg Oral QHS PRN Arthor Captain, MD        Lab Results:  Results for orders placed or performed during the hospital encounter of 04/30/21 (from the past 48 hour(s))  Glucose, capillary     Status: Abnormal   Collection Time: 05/01/21  5:17 PM  Result Value Ref Range   Glucose-Capillary 163 (H) 70 - 99 mg/dL    Comment: Glucose reference range applies only to samples taken after fasting for at least 8 hours.   Comment 1 Notify RN    Comment 2 Document in Chart   Glucose, capillary     Status: Abnormal   Collection Time: 05/01/21  9:08 PM  Result Value Ref Range   Glucose-Capillary 168 (H) 70 - 99 mg/dL    Comment: Glucose reference range applies only to samples taken after fasting for at least 8 hours.  Lipid panel     Status: Abnormal   Collection Time: 05/02/21  6:14 AM  Result Value Ref Range   Cholesterol 118 0 - 200 mg/dL    Triglycerides 144 <150 mg/dL   HDL 36 (L) >40 mg/dL   Total CHOL/HDL Ratio 3.3 RATIO   VLDL 29 0 - 40 mg/dL   LDL Cholesterol 53 0 - 99 mg/dL    Comment:        Total Cholesterol/HDL:CHD Risk Coronary Heart Disease Risk Table                     Men   Women  1/2 Average Risk   3.4   3.3  Average Risk       5.0   4.4  2 X Average Risk   9.6   7.1  3 X Average Risk  23.4   11.0        Use the calculated Patient Ratio above and the CHD Risk Table to determine the patient's CHD Risk.        ATP III CLASSIFICATION (LDL):  <100     mg/dL   Optimal  100-129  mg/dL   Near or Above                    Optimal  130-159  mg/dL   Borderline  160-189  mg/dL   High  >190     mg/dL   Very High Performed at [place] [facility] [address] [place], [place] [address]   Comprehensive metabolic panel     Status: Abnormal   Collection Time: 05/02/21  6:14 AM  Result Value Ref Range   Sodium 138  135 - 145 mmol/L   Potassium 3.7 3.5 - 5.1 mmol/L   Chloride 101 98 - 111 mmol/L   CO2 24 22 - 32 mmol/L   Glucose, Bld 131 (H) 70 - 99 mg/dL    Comment: Glucose reference range applies only to samples taken after fasting for at least 8 hours.   BUN 12 6 - 20 mg/dL   Creatinine, Ser 0.98 0.61 - 1.24 mg/dL   Calcium 8.9 8.9 - 10.3 mg/dL   Total Protein 6.8 6.5 - 8.1 g/dL   Albumin 4.2 3.5 - 5.0 g/dL   AST 64 (H) 15 - 41 U/L   ALT 126 (H) 0 - 44 U/L   Alkaline Phosphatase 72 38 - 126 U/L   Total Bilirubin 1.3 (H) 0.3 - 1.2 mg/dL   GFR, Estimated >60 >60 mL/min    Comment: (NOTE) Calculated using the CKD-EPI Creatinine Equation (2021)    Anion gap 13 5 - 15    Comment: Performed at [facility], [place] [address] [place], [place] [address]  CBC with Differential/Platelet     Status: Abnormal   Collection Time: 05/02/21  6:14 AM  Result Value Ref Range   WBC 8.3 4.0 - 10.5 K/uL   RBC 5.28 4.22 - 5.81 MIL/uL   Hemoglobin 15.0 13.0 - 17.0 g/dL   HCT 44.7 39.0 -  52.0 %   MCV 84.7 80.0 - 100.0 fL   MCH 28.4 26.0 - 34.0 pg   MCHC 33.6 30.0 - 36.0 g/dL   RDW 12.6 11.5 - 15.5 %   Platelets 179 150 - 400 K/uL   nRBC 0.0 0.0 - 0.2 %   Neutrophils Relative % 82 %   Neutro Abs 6.9 1.7 - 7.7 K/uL   Lymphocytes Relative 7 %   Lymphs Abs 0.6 (L) 0.7 - 4.0 K/uL   Monocytes Relative 10 %   Monocytes Absolute 0.8 0.1 - 1.0 K/uL   Eosinophils Relative 1 %   Eosinophils Absolute 0.1 0.0 - 0.5 K/uL   Basophils Relative 0 %   Basophils Absolute 0.0 0.0 - 0.1 K/uL   Immature Granulocytes 0 %   Abs Immature Granulocytes 0.03 0.00 - 0.07 K/uL    Comment: Performed at [facility], [place] [address], [place], [place] [address]  TSH     Status: None   Collection Time: 05/02/21  6:14 AM  Result Value Ref Range   TSH 0.740 0.350 - 4.500 uIU/mL    Comment: Performed by a 3rd Generation assay with a functional sensitivity of <=0.01 uIU/mL. Performed at [facility], [place] [address] [place], [place] [address]   Glucose, capillary     Status: Abnormal   Collection Time: 05/02/21  6:34 AM  Result Value Ref Range   Glucose-Capillary 152 (H) 70 - 99 mg/dL    Comment: Glucose reference range applies only to samples taken after fasting for at least 8 hours.  Resp Panel by RT-PCR (Flu A&B, Covid) Nasopharyngeal Swab     Status: Abnormal   Collection Time: 05/02/21  7:11 AM   Specimen: Nasopharyngeal Swab; Nasopharyngeal(NP) swabs in vial transport medium  Result Value Ref Range   SARS Coronavirus 2 by RT PCR POSITIVE (A) NEGATIVE    Comment: RESULT CALLED TO, READ BACK BY AND VERIFIED WITH: BEATRICE J.ON 05/02/2021 @ 1051 BY MECIAL J. (NOTE) SARS-CoV-2 target nucleic acids are DETECTED.  The SARS-CoV-2 RNA is generally detectable in upper respiratory specimens during the acute phase of  infection. Positive results are indicative of the presence of the identified virus, but do not rule out bacterial infection or co-infection with  other pathogens not detected by the test. Clinical correlation with patient history and other diagnostic information is necessary to determine patient infection status. The expected result is Negative.  Fact Sheet for Patients: EntrepreneurPulse.com.au  Fact Sheet for Healthcare Providers: IncredibleEmployment.be  This test is not yet approved or cleared by the [place] FDA and  has been authorized for detection and/or diagnosis of SARS-CoV-2 by FDA under an Emergency Use Authorization (EUA).  This EUA will remain in effect (meaning this  test can be used) for the duration of  the COVID-19 declaration under Section 564(b)(1) of the Act, 21 U.S.C. section 360bbb-3(b)(1), unless the authorization is terminated or revoked sooner.     Influenza A by PCR NEGATIVE NEGATIVE   Influenza B by PCR NEGATIVE NEGATIVE    Comment: (NOTE) The Xpert Xpress SARS-CoV-2/FLU/RSV plus assay is intended as an aid in the diagnosis of influenza from Nasopharyngeal swab specimens and should not be used as a sole basis for treatment. Nasal washings and aspirates are unacceptable for Xpert Xpress SARS-CoV-2/FLU/RSV testing.  Fact Sheet for Patients: EntrepreneurPulse.com.au  Fact Sheet for Healthcare Providers: IncredibleEmployment.be  This test is not yet approved or cleared by the [place] FDA and has been authorized for detection and/or diagnosis of SARS-CoV-2 by FDA under an Emergency Use Authorization (EUA). This EUA will remain in effect (meaning this test can be used) for the duration of the COVID-19 declaration under Section 564(b)(1) of the Act, 21 U.S.C. section 360bbb-3(b)(1), unless the authorization is terminated or revoked.  Performed at [facility], [place] [address] [place], [place] [address]   Urinalysis, Complete w Microscopic Urine, Clean Catch     Status: Abnormal   Collection  Time: 05/02/21 10:22 AM  Result Value Ref Range   Color, Urine YELLOW YELLOW   APPearance TURBID (A) CLEAR   Specific Gravity, Urine 1.016 1.005 - 1.030   pH 5.0 5.0 - 8.0   Glucose, UA >=500 (A) NEGATIVE mg/dL   Hgb urine dipstick NEGATIVE NEGATIVE   Bilirubin Urine NEGATIVE NEGATIVE   Ketones, ur NEGATIVE NEGATIVE mg/dL   Protein, ur NEGATIVE NEGATIVE mg/dL   Nitrite NEGATIVE NEGATIVE   Leukocytes,Ua NEGATIVE NEGATIVE   RBC / HPF 0-5 0 - 5 RBC/hpf   WBC, UA 0-5 0 - 5 WBC/hpf   Bacteria, UA RARE (A) NONE SEEN   Mucus PRESENT     Comment: Performed at [facility], [place] [address], [place], [place] [address]  Glucose, capillary     Status: Abnormal   Collection Time: 05/02/21 12:34 PM  Result Value Ref Range   Glucose-Capillary 135 (H) 70 - 99 mg/dL    Comment: Glucose reference range applies only to samples taken after fasting for at least 8 hours.  Glucose, capillary     Status: Abnormal   Collection Time: 05/02/21  5:24 PM  Result Value Ref Range   Glucose-Capillary 176 (H) 70 - 99 mg/dL    Comment: Glucose reference range applies only to samples taken after fasting for at least 8 hours.  Glucose, capillary     Status: Abnormal   Collection Time: 05/03/21  3:10 AM  Result Value Ref Range   Glucose-Capillary 157 (H) 70 - 99 mg/dL    Comment: Glucose reference range applies only to samples taken after fasting for at least 8 hours.  Glucose, capillary  Status: Abnormal   Collection Time: 05/03/21  6:15 AM  Result Value Ref Range   Glucose-Capillary 202 (H) 70 - 99 mg/dL    Comment: Glucose reference range applies only to samples taken after fasting for at least 8 hours.  Glucose, capillary     Status: Abnormal   Collection Time: 05/03/21 11:52 AM  Result Value Ref Range   Glucose-Capillary 190 (H) 70 - 99 mg/dL    Comment: Glucose reference range applies only to samples taken after fasting for at least 8 hours.   Comment 1 Notify RN    Comment 2  Document in Chart     Blood Alcohol level:  Lab Results  Component Value Date   ETH 45 (H) 16/06/9603    Metabolic Disorder Labs: Lab Results  Component Value Date   HGBA1C 6.5 (H) 04/30/2021   MPG 139.85 04/30/2021   MPG 301 01/07/2021   No results found for: PROLACTIN Lab Results  Component Value Date   CHOL 118 05/02/2021   TRIG 144 05/02/2021   HDL 36 (L) 05/02/2021   CHOLHDL 3.3 05/02/2021   VLDL 29 05/02/2021   LDLCALC 53 05/02/2021   LDLCALC 155 (H) 01/11/2021    Physical Findings: AIMS:  , ,  ,  ,    CIWA:  CIWA-Ar Total: 4 COWS:     Musculoskeletal: Strength & Muscle Tone: within normal limits Gait & Station: normal Patient leans: N/A  Psychiatric Specialty Exam:  Presentation  General Appearance: Appropriate for Environment  Eye Contact:Fair  Speech:Clear and Coherent; Normal Rate  Speech Volume:Normal  Handedness: No data recorded  Mood and Affect  Mood:Anxious ("Better.  Less anxious, less depressed.")  Affect:Congruent   Thought Process  Thought Processes:Coherent; Goal Directed  Descriptions of Associations:Intact  Orientation:Full (Time, Place and Person)  Thought Content:Logical; WDL  History of Schizophrenia/Schizoaffective disorder:No  Duration of Psychotic Symptoms:No data recorded Hallucinations:Hallucinations: None  Ideas of Reference:None  Suicidal Thoughts:Suicidal Thoughts: No  Homicidal Thoughts:Homicidal Thoughts: No   Sensorium  Memory:Immediate Good; Recent Good  Judgment:Good  Insight:Good   Executive Functions  Concentration:Good  Attention Span:Good  [place] of Knowledge:Good  Language:Good   Psychomotor Activity  Psychomotor Activity:Psychomotor Activity: Normal   Assets  Assets:Communication Skills; Desire for Improvement; Housing; Social Support; Talents/Skills; Vocational/Educational   Sleep  Sleep:Sleep: Poor Number of Hours of Sleep: 1    Physical  Exam: Physical Exam Vitals and nursing note reviewed.  Constitutional:      General: He is not in acute distress.    Appearance: He is not diaphoretic.  HENT:     Head: Normocephalic and atraumatic.  Cardiovascular:     Rate and Rhythm: Normal rate.  Pulmonary:     Effort: Pulmonary effort is normal.  Neurological:     General: No focal deficit present.     Mental Status: He is alert and oriented to person, place, and time.   Review of Systems  Constitutional:  Positive for chills, diaphoresis and malaise/fatigue. Negative for fever.  HENT:  Negative for sore throat.   Respiratory:  Negative for cough and shortness of breath.   Cardiovascular:  Negative for chest pain and palpitations.  Gastrointestinal:  Negative for constipation, diarrhea, nausea and vomiting.  Genitourinary: Negative.   Musculoskeletal:  Positive for myalgias.       Positive for chronic bilateral pain in hands   Skin:  Negative for rash.  Neurological:  Positive for headaches. Negative for dizziness and tremors.  Psychiatric/Behavioral:  Positive for depression.  Negative for hallucinations and suicidal ideas. The patient is nervous/anxious and has insomnia.   Blood pressure 118/72, pulse 74, temperature 98.2 F (36.8 C), temperature source Oral, resp. rate 17, height 5' 2"  (1.575 m), weight 97.5 kg, SpO2 100 %. Body mass index is 39.32 kg/m.   Treatment Plan Summary: Patient is a 35 year old male with MDD and GAD admitted with worsening symptoms of depression and near suicide attempt.    Daily contact with patient to assess and evaluate symptoms and progress in treatment and Medication management  Continue IVC status.    COVID+ -Patient is on isolation precautions in his room with airborne and contact precautions.  -CXR on the evening of 05/02/2021 was negative -Continue albuterol inhaler 2 puffs every 4 hours PRN wheezing, shortness of breath, chest tightness -Continue acetaminophen 650 mg every 6 hours  PRN pain -Start ibuprofen 400 mg twice daily PRN moderate pain, headaches -Repeat CBC and differential ordered for a.m. draw today was not drawn and remains ordered for this evening -Push p.o. fluids -Has been moved to a medical bed on [facility] 300 unit so he can sleep with head of bed elevated -Elevate head of bed at night  Diabetes -Continue Lantus insulin 30 units at bedtime -Continue NovoLog sliding scale 3 times daily with meals and at bedtime -Continue CBG monitoring 3 times daily before meals and at bedtime  MDD/GAD -Continue Zoloft 50 mg daily for now.  Plan to increase Zoloft to 100 mg daily once patient's headache and other symptoms from COVID infection have resolved. -Continue hydroxyzine 25 mg 3 times daily PRN anxiety  Insomnia -Continue trazodone 100 mg at bedtime -Continue trazodone 50 mg nightly PRN which may be given for sleep if standing dose trazodone not effective after 1 hour -Patient has possible comorbid undiagnosed sleep apnea.  I have discussed with patient that he should seek referral for sleep study from his PCP after discharge to assess for sleep apnea.  Seasonal allergies -Continue Claritin 10 mg daily  Hyperlipidemia -Continue Lipitor 40 mg daily  Patient remains on CIWA protocol with comfort meds and PRN lorazepam to cover for CIWA scores >12.  Suspect alcohol withdrawal is unlikely given patient's limited frequency and amount of alcohol consumption in recent years.  CIWA score at noon today was 1 (patient scored for headache only)  Discharge planning in progress  Arthor Captain, MD 05/03/2021, 4:40 PM

## 2021-05-03 NOTE — Progress Notes (Signed)
Pt reports increased congestion, sore throat, and continues to complain of headache- Pt did report some relief after receiving ibuprofen and heat pack. Pt denied SOB, and reports still having an appetite and his "sense of taste", eating all of his supper. Pt offered personal pitcher of fluid and encouraged to drink and deep breathe.

## 2021-05-03 NOTE — Progress Notes (Signed)
Pt came back from [facility] where he had gone for chest x-ray. On arrival, pt was ambulatory, alert and oriented although, pt complained of headache and an increased anxiety, tylenol and ativan PO was administered as ordered. Pt was offered food and fluid. Pt was further explained that he was to stayed in his room due to being COVID positive. Pt was receptive and understanding. Pt CBG at 0310 was 157 mg/dl, will check again when pt wake up. V/S WNL, will continue to monitor.

## 2021-05-03 NOTE — Progress Notes (Signed)
   05/03/21 1300  Psych Admission Type (Psych Patients Only)  Admission Status Voluntary  Psychosocial Assessment  Patient Complaints Malaise;Anxiety  Eye Contact Fair  Facial Expression Animated  Affect Appropriate to circumstance  Speech Logical/coherent  Interaction Assertive  Motor Activity Other (Comment) (WDL)  Appearance/Hygiene Unremarkable  Behavior Characteristics Cooperative  Mood Anxious  Thought Process  Coherency WDL  Content WDL  Delusions None reported or observed  Perception WDL  Hallucination None reported or observed  Judgment Impaired  Confusion None  Danger to Self  Current suicidal ideation? Denies  Danger to Others  Danger to Others None reported or observed

## 2021-05-03 NOTE — ED Provider Notes (Signed)
Patient seen and is receiving treatment for SI and depression at [facility]. COVID test resulted as positive. Sent to ED for CXR to insure no pulmonary compromise. Patient reports generalized fatigue, intermittent chest tightness without significant other symptoms. Reported fever. None here.     Today's Vitals   05/02/21 1723 05/02/21 1849 05/02/21 1851 05/03/21 0249  BP: 130/84  (!) 142/95 120/80  Pulse: 95  (!) 102 76  Resp: 16  18 17   Temp:   99.2 F (37.3 C) 98.2 F (36.8 C)  TempSrc:   Oral Oral  SpO2: 98%  98% 100%  Weight:   97.5 kg   Height:   5\' 2"  (1.575 m)   PainSc:  0-No pain     Body mass index is 39.32 kg/m.  Very well appearing. In NAD. O2 sat 98-100% consistently.  Lungs clear to auscultation RRR, no tachycardia Abd benign  DG Chest 2 View  Result Date: 05/02/2021 CLINICAL DATA:  COVID positive. EXAM: CHEST - 2 VIEW COMPARISON:  None. FINDINGS: The heart size and mediastinal contours are within normal limits. Both lungs are clear. The visualized skeletal structures are unremarkable. IMPRESSION: No active cardiopulmonary disease. Electronically Signed   By: M.D.   On: 05/02/2021 19:20    He can be transferred by to [facility] to continue treatment on their COVID unit. Albuterol inhaler recommended as needed for any chest tightness.    07/02/2021, PA-C 05/03/21 0301    Elpidio Anis, MD 05/03/21 [phone]

## 2021-05-03 NOTE — ED Notes (Signed)
Safe transport called for pt  

## 2021-05-03 NOTE — [facility] Group Notes (Signed)
Due to COVID precautions on the unit, pts were given packet to complete individually.   Ordean Fouts, LCSWA Clinicial Social Worker [facility] 

## 2021-05-03 NOTE — [facility] Group Notes (Signed)
Patient is being quarantined, therefore he did not attend group.

## 2021-05-03 NOTE — Progress Notes (Addendum)
George Howe was resting much of the evening.  He remains on contact/airborne precautions.  He denied SI/HI or AVH.  He stated he is feeling some better but still complaining of congestion and malaise.  No new symptoms at this time.  He took his hs medications without difficulty.  Evening blood sugar was 176 and no insulin coverage needed.  He is currently resting with his eyes closed and appears to be asleep.  Q 15 minute checks maintained for safety.   05/03/21 2215  Psych Admission Type (Psych Patients Only)  Admission Status Voluntary  Psychosocial Assessment  Patient Complaints Malaise  Eye Contact Fair  Facial Expression Anxious  Affect Appropriate to circumstance  Speech Logical/coherent  Interaction Assertive  Motor Activity Slow  Appearance/Hygiene Unremarkable  Behavior Characteristics Cooperative;Appropriate to situation  Mood Anxious  Thought Process  Coherency WDL  Content WDL  Delusions None reported or observed  Perception WDL  Hallucination None reported or observed  Judgment Impaired  Confusion None  Danger to Self  Current suicidal ideation? Denies  Danger to Others  Danger to Others None reported or observed

## 2021-05-04 DIAGNOSIS — F332 Major depressive disorder, recurrent severe without psychotic features: Secondary | ICD-10-CM | POA: Diagnosis not present

## 2021-05-04 LAB — CBC WITH DIFFERENTIAL/PLATELET
Abs Immature Granulocytes: 0.07 10*3/uL (ref 0.00–0.07)
Basophils Absolute: 0 10*3/uL (ref 0.0–0.1)
Basophils Relative: 0 %
Eosinophils Absolute: 0.1 10*3/uL (ref 0.0–0.5)
Eosinophils Relative: 1 %
HCT: 47.7 % (ref 39.0–52.0)
Hemoglobin: 15.7 g/dL (ref 13.0–17.0)
Immature Granulocytes: 1 %
Lymphocytes Relative: 21 %
Lymphs Abs: 1.1 10*3/uL (ref 0.7–4.0)
MCH: 27.9 pg (ref 26.0–34.0)
MCHC: 32.9 g/dL (ref 30.0–36.0)
MCV: 84.9 fL (ref 80.0–100.0)
Monocytes Absolute: 0.4 10*3/uL (ref 0.1–1.0)
Monocytes Relative: 8 %
Neutro Abs: 3.7 10*3/uL (ref 1.7–7.7)
Neutrophils Relative %: 69 %
Platelets: 145 10*3/uL — ABNORMAL LOW (ref 150–400)
RBC: 5.62 MIL/uL (ref 4.22–5.81)
RDW: 12.6 % (ref 11.5–15.5)
WBC: 5.4 10*3/uL (ref 4.0–10.5)
nRBC: 0 % (ref 0.0–0.2)

## 2021-05-04 LAB — GLUCOSE, CAPILLARY
Glucose-Capillary: 175 mg/dL — ABNORMAL HIGH (ref 70–99)
Glucose-Capillary: 120 mg/dL — ABNORMAL HIGH (ref 70–99)
Glucose-Capillary: 166 mg/dL — ABNORMAL HIGH (ref 70–99)
Glucose-Capillary: 246 mg/dL — ABNORMAL HIGH (ref 70–99)

## 2021-05-04 LAB — HEPATIC FUNCTION PANEL
ALT: 92 U/L — ABNORMAL HIGH (ref 0–44)
AST: 37 U/L (ref 15–41)
Albumin: 4.1 g/dL (ref 3.5–5.0)
Alkaline Phosphatase: 68 U/L (ref 38–126)
Bilirubin, Direct: 0.2 mg/dL (ref 0.0–0.2)
Indirect Bilirubin: 0.6 mg/dL (ref 0.3–0.9)
Total Bilirubin: 0.8 mg/dL (ref 0.3–1.2)
Total Protein: 6.9 g/dL (ref 6.5–8.1)

## 2021-05-04 MED ORDER — GUAIFENESIN ER 600 MG PO TB12
1200.0000 mg | ORAL_TABLET | Freq: Two times a day (BID) | ORAL | Status: DC | PRN
  Administered 2021-05-04 – 2021-05-05 (×3): 1200 mg via ORAL
  Filled 2021-05-04 (×3): qty 2

## 2021-05-04 NOTE — Progress Notes (Signed)
Patient unable to attend group due to being on airborne  precautions.

## 2021-05-04 NOTE — Progress Notes (Signed)
[facility] MD Progress Note  05/04/2021 1:10 PM George Howe  MRN:  [id number]  Reason for admission:  George Howe is a 35 year old male with a history of diabetes (recently diagnosed in April 2022), history of pancreatitis, hyperlipidemia, childhood trauma, GAD and MDD brought in by a friend to [facility] after a near suicide attempt in the context of stressors of recent diagnosis of DM, recent failed business venture, financial stressors and alcohol use on the evening of near attempt.  Patient reported that he was contemplating cutting his throat with a large knife and had the knife in his hand but did not cut himself because of his dog and instead called a friend for help who brought him to the ED.  Objective: Medical record reviewed.  Patient's case discussed in detail with members of the treatment team.  I met with and evaluated the patient on the unit for follow-up today.  Patient presents with anxious mood, congruent affect and organized thought processes.  He continues with physical symptoms of intermittent chills, myalgias, nasal congestion, cough, headache, fatigue consistent with COVID+ status.  He reports having a little diarrhea last night but none thus far today.  Patient denies chest pain, shortness of breath, wheezing, palpitations, nausea, vomiting, change in sense of taste or smell, confusion.  He is eating well and drinking p.o. fluids frequently.  He reports sleeping better last night despite his physical symptoms.  Patient states that his anxiety is unchanged and rates it at a 5 or 6 out of 10 in intensity.  He denies depressed mood, SI, AI, HI, PI, AH or VH.  We discussed whether to increase his Zoloft or continue it at its current dose of 50 mg daily decided to defer Zoloft dose increase until patient's physical symptoms from COVID infection have improved.  Patient slept 5.25 hours last night.  He remains afebrile.  Vital signs this morning include BP of 120/86, pulse of 92, respirations 18, O2  sat of 98% on room air and temperature of 98.4.  Labs this morning include hepatic panel with ALT of 92 (down from 126 on 05/02/2021) and otherwise WNL.  CBC and differential with platelets of 145 and otherwise WNL.  Capillary blood glucose readings over the last 24 hours have ranged from a low of 120 to a high of 245.  CIWA scores: 4 at noon yesterday (scored 1 for anxiety and 3 for headache); 4 at 6 PM yesterday (scored 1 for anxiety and 3 for headache); 2 at 6 AM today (scored for anxiety).  Nursing staff report that patient has been adherent with quarantine and contact/airborne precautions, taking medications as prescribed, drinking p.o. fluids and eating adequately.  Staff document he has voiced intermittent concerns of headache, congestion and malaise.  He received PRN ibuprofen for headache, albuterol for shortness of breath and Mucinex for congestion.  He also received PRN hydroxyzine this morning for anxiety.  Principal Problem: MDD (major depressive disorder), recurrent severe, without psychosis ([place]) Diagnosis: Principal Problem:   MDD (major depressive disorder), recurrent severe, without psychosis ([place]) Active Problems:   GAD (generalized anxiety disorder)  Total Time spent with patient:  20 minutes  Past Psychiatric History: See admission H&P  Past Medical History:  Past Medical History:  Diagnosis Date   Chicken pox    Depression    Diabetes mellitus without complication ([place])    DM (diabetes mellitus) ([place])    Hay fever    Hyperlipidemia    Hyperlipidemia    Morbid obesity ([place])  History reviewed. No pertinent surgical history. Family History:  Family History  Problem Relation Age of Onset   Diabetes Father    Diabetes Paternal Grandmother    Family Psychiatric  History: See admission H&P Social History:  Social History   Substance and Sexual Activity  Alcohol Use Yes   Comment: occasional     Social History   Substance and Sexual Activity  Drug Use Yes     Social History   Socioeconomic History   Marital status: Single    Spouse name: Not on file   Number of children: Not on file   Years of education: Not on file   Highest education level: Not on file  Occupational History   Not on file  Tobacco Use   Smoking status: Every Day    Packs/day: 1.00    Years: 17.00    Pack years: 17.00    Types: Cigarettes   Smokeless tobacco: Never  Vaping Use   Vaping Use: Never used  Substance and Sexual Activity   Alcohol use: Yes    Comment: occasional   Drug use: Yes   Sexual activity: Yes  Other Topics Concern   Not on file  Social History Narrative   Not on file   Social Determinants of Health   Financial Resource Strain: Not on file  Food Insecurity: Not on file  Transportation Needs: Not on file  Physical Activity: Not on file  Stress: Not on file  Social Connections: Not on file   Additional Social History:                         Sleep: Fair  Appetite:  Good  Current Medications: Current Facility-Administered Medications  Medication Dose Route Frequency Provider Last Rate Last Admin   acetaminophen (TYLENOL) tablet 650 mg  650 mg Oral Q6H PRN Merlyn Lot E, NP   650 mg at 05/03/21 0341   albuterol (VENTOLIN HFA) 108 (90 Base) MCG/ACT inhaler 2 puff  2 puff Inhalation Q4H PRN Charlann Lange, PA-C   2 puff at 05/04/21 0819   alum & mag hydroxide-simeth (MAALOX/MYLANTA) 200-200-20 MG/5ML suspension 30 mL  30 mL Oral Q4H PRN Merlyn Lot E, NP       atorvastatin (LIPITOR) tablet 40 mg  40 mg Oral QHS Merlyn Lot E, NP   40 mg at 05/03/21 2215   guaiFENesin (MUCINEX) 12 hr tablet 1,200 mg  1,200 mg Oral BID PRN Prescilla Sours, PA-C   1,200 mg at 05/04/21 0322   hydrOXYzine (ATARAX/VISTARIL) tablet 25 mg  25 mg Oral TID PRN Mallie Darting, NP   25 mg at 05/04/21 9485   ibuprofen (ADVIL) tablet 400 mg  400 mg Oral BID PRN Lindell Spar I, NP   400 mg at 05/04/21 0322   insulin aspart (novoLOG) injection 0-15  Units  0-15 Units Subcutaneous TID WC Merlyn Lot E, NP   3 Units at 05/04/21 0709   insulin aspart (novoLOG) injection 0-5 Units  0-5 Units Subcutaneous QHS Merlyn Lot E, NP   2 Units at 04/30/21 2134   insulin glargine (LANTUS) injection 30 Units  30 Units Subcutaneous QHS Harlow Asa, MD   30 Units at 05/03/21 2216   loratadine (CLARITIN) tablet 10 mg  10 mg Oral Daily Merlyn Lot E, NP   10 mg at 05/04/21 0818   multivitamin with minerals tablet 1 tablet  1 tablet Oral Daily Ethelene Hal, NP   1  tablet at 05/04/21 9485   nicotine (NICODERM CQ - dosed in mg/24 hours) patch 21 mg  21 mg Transdermal Once Merlyn Lot E, NP       nicotine (NICODERM CQ - dosed in mg/24 hours) patch 21 mg  21 mg Transdermal Daily Nelda Marseille, Amy E, MD   21 mg at 05/04/21 0820   sertraline (ZOLOFT) tablet 50 mg  50 mg Oral Daily Arthor Captain, MD   50 mg at 05/04/21 0818   thiamine tablet 100 mg  100 mg Oral Daily Ethelene Hal, NP   100 mg at 05/04/21 0818   traZODone (DESYREL) tablet 100 mg  100 mg Oral QHS Arthor Captain, MD   100 mg at 05/03/21 2215   traZODone (DESYREL) tablet 50 mg  50 mg Oral QHS PRN Arthor Captain, MD        Lab Results:  Results for orders placed or performed during the hospital encounter of 04/30/21 (from the past 48 hour(s))  Glucose, capillary     Status: Abnormal   Collection Time: 05/02/21  5:24 PM  Result Value Ref Range   Glucose-Capillary 176 (H) 70 - 99 mg/dL    Comment: Glucose reference range applies only to samples taken after fasting for at least 8 hours.  Glucose, capillary     Status: Abnormal   Collection Time: 05/03/21  3:10 AM  Result Value Ref Range   Glucose-Capillary 157 (H) 70 - 99 mg/dL    Comment: Glucose reference range applies only to samples taken after fasting for at least 8 hours.  Glucose, capillary     Status: Abnormal   Collection Time: 05/03/21  6:15 AM  Result Value Ref Range   Glucose-Capillary 202 (H) 70 - 99 mg/dL     Comment: Glucose reference range applies only to samples taken after fasting for at least 8 hours.  Glucose, capillary     Status: Abnormal   Collection Time: 05/03/21 11:52 AM  Result Value Ref Range   Glucose-Capillary 190 (H) 70 - 99 mg/dL    Comment: Glucose reference range applies only to samples taken after fasting for at least 8 hours.   Comment 1 Notify RN    Comment 2 Document in Chart   Glucose, capillary     Status: Abnormal   Collection Time: 05/03/21  5:17 PM  Result Value Ref Range   Glucose-Capillary 245 (H) 70 - 99 mg/dL    Comment: Glucose reference range applies only to samples taken after fasting for at least 8 hours.   Comment 1 Notify RN    Comment 2 Document in Chart   Glucose, capillary     Status: Abnormal   Collection Time: 05/03/21  9:11 PM  Result Value Ref Range   Glucose-Capillary 176 (H) 70 - 99 mg/dL    Comment: Glucose reference range applies only to samples taken after fasting for at least 8 hours.  Glucose, capillary     Status: Abnormal   Collection Time: 05/04/21  6:41 AM  Result Value Ref Range   Glucose-Capillary 175 (H) 70 - 99 mg/dL    Comment: Glucose reference range applies only to samples taken after fasting for at least 8 hours.  CBC with Differential/Platelet     Status: Abnormal   Collection Time: 05/04/21  6:42 AM  Result Value Ref Range   WBC 5.4 4.0 - 10.5 K/uL   RBC 5.62 4.22 - 5.81 MIL/uL   Hemoglobin 15.7 13.0 - 17.0 g/dL  HCT 47.7 39.0 - 52.0 %   MCV 84.9 80.0 - 100.0 fL   MCH 27.9 26.0 - 34.0 pg   MCHC 32.9 30.0 - 36.0 g/dL   RDW 12.6 11.5 - 15.5 %   Platelets 145 (L) 150 - 400 K/uL   nRBC 0.0 0.0 - 0.2 %   Neutrophils Relative % 69 %   Neutro Abs 3.7 1.7 - 7.7 K/uL   Lymphocytes Relative 21 %   Lymphs Abs 1.1 0.7 - 4.0 K/uL   Monocytes Relative 8 %   Monocytes Absolute 0.4 0.1 - 1.0 K/uL   Eosinophils Relative 1 %   Eosinophils Absolute 0.1 0.0 - 0.5 K/uL   Basophils Relative 0 %   Basophils Absolute 0.0 0.0 -  0.1 K/uL   Immature Granulocytes 1 %   Abs Immature Granulocytes 0.07 0.00 - 0.07 K/uL    Comment: Performed at [facility], [place] [address], [place], [place] [address]  Hepatic function panel     Status: Abnormal   Collection Time: 05/04/21  6:42 AM  Result Value Ref Range   Total Protein 6.9 6.5 - 8.1 g/dL   Albumin 4.1 3.5 - 5.0 g/dL   AST 37 15 - 41 U/L   ALT 92 (H) 0 - 44 U/L   Alkaline Phosphatase 68 38 - 126 U/L   Total Bilirubin 0.8 0.3 - 1.2 mg/dL   Bilirubin, Direct 0.2 0.0 - 0.2 mg/dL   Indirect Bilirubin 0.6 0.3 - 0.9 mg/dL    Comment: Performed at [facility], [place] [address] [place], [place] [address]  Glucose, capillary     Status: Abnormal   Collection Time: 05/04/21 12:30 PM  Result Value Ref Range   Glucose-Capillary 120 (H) 70 - 99 mg/dL    Comment: Glucose reference range applies only to samples taken after fasting for at least 8 hours.    Blood Alcohol level:  Lab Results  Component Value Date   ETH 45 (H) 35/32/9924    Metabolic Disorder Labs: Lab Results  Component Value Date   HGBA1C 6.5 (H) 04/30/2021   MPG 139.85 04/30/2021   MPG 301 01/07/2021   No results found for: PROLACTIN Lab Results  Component Value Date   CHOL 118 05/02/2021   TRIG 144 05/02/2021   HDL 36 (L) 05/02/2021   CHOLHDL 3.3 05/02/2021   VLDL 29 05/02/2021   LDLCALC 53 05/02/2021   LDLCALC 155 (H) 01/11/2021    Physical Findings: AIMS:  , ,  ,  ,    CIWA:  CIWA-Ar Total: 2 COWS:     Musculoskeletal: Strength & Muscle Tone: within normal limits Gait & Station: normal Patient leans: N/A  Psychiatric Specialty Exam:  Presentation  General Appearance: Appropriate for Environment  Eye Contact:Good  Speech:Clear and Coherent; Normal Rate  Speech Volume:Normal  Handedness: No data recorded  Mood and Affect  Mood:Anxious  Affect:Congruent   Thought Process  Thought Processes:Coherent  Descriptions of  Associations:Intact  Orientation:Full (Time, Place and Person)  Thought Content:Logical; WDL  History of Schizophrenia/Schizoaffective disorder:No  Duration of Psychotic Symptoms:No data recorded Hallucinations:Hallucinations: None  Ideas of Reference:None  Suicidal Thoughts:Suicidal Thoughts: No  Homicidal Thoughts:Homicidal Thoughts: No   Sensorium  Memory:Immediate Good; Recent Good  Judgment:Good  Insight:Good   Executive Functions  Concentration:Good  Attention Span:Good  [place] of Knowledge:Good  Language:Good   Psychomotor Activity  Psychomotor Activity:Psychomotor Activity: Normal   Assets  Assets:Communication Skills; Desire for Improvement; Housing; Social Support; Talents/Skills;  Vocational/Educational   Sleep  Sleep:Sleep: Fair Number of Hours of Sleep: 5.25    Physical Exam: Physical Exam Vitals and nursing note reviewed.  Constitutional:      General: He is not in acute distress.    Appearance: He is not diaphoretic.  HENT:     Head: Normocephalic and atraumatic.  Cardiovascular:     Rate and Rhythm: Normal rate.  Pulmonary:     Effort: Pulmonary effort is normal.  Neurological:     General: No focal deficit present.     Mental Status: He is alert and oriented to person, place, and time.   Review of Systems  Constitutional:  Positive for chills, diaphoresis and malaise/fatigue. Negative for fever.  HENT:  Positive for congestion. Negative for sore throat.   Respiratory:  Positive for cough. Negative for shortness of breath and wheezing.   Cardiovascular:  Negative for chest pain and palpitations.  Gastrointestinal:  Positive for diarrhea. Negative for constipation, nausea and vomiting.  Genitourinary: Negative.   Musculoskeletal:  Positive for myalgias.       Positive for chronic bilateral pain in hands   Skin:  Negative for rash.  Neurological:  Positive for headaches. Negative for dizziness and tremors.   Psychiatric/Behavioral:  Positive for depression. Negative for hallucinations and suicidal ideas. The patient is nervous/anxious. The patient does not have insomnia.   Blood pressure 120/86, pulse 92, temperature 98.4 F (36.9 C), temperature source Oral, resp. rate 18, height _0  (1.575 m), weight 97.5 kg, SpO2 98 %. Body mass index is 39.32 kg/m.   Treatment Plan Summary: Patient is a 35 year old male with MDD and GAD admitted with worsening symptoms of depression and near suicide attempt.  He tested positive for COVID-19 on the morning of 05/02/2021 which was his second day of hospitalization.  He has had intermittent symptoms consistent with COVID-positive status since that time.  Increase in Zoloft dose to target symptoms of anxiety and depression has been deferred so as not to exacerbate headache and other discomfort patient is experiencing from current COVID infection.  Daily contact with patient to assess and evaluate symptoms and progress in treatment and Medication management  Continue IVC status.    COVID+ -Positive SARS Coronavirus 2 by RT PCR test performed on 05/02/2021. -Patient is quarantining in his room with airborne and contact precautions.  He has remained afebrile since morning of 05/02/2021 (the morning of positive COVID test) -CXR on the evening of 05/02/2021 was negative -Continue albuterol inhaler 2 puffs every 4 hours PRN wheezing, shortness of breath, chest tightness -Continue Mucinex 1200 mg twice daily PRN congestion -Continue acetaminophen 650 mg every 6 hours PRN pain -Continue ibuprofen 400 mg twice daily PRN moderate pain, headaches -Repeat CBC and differential drawn this morning (05/04/2021) was WNL except for platelets of 145. -Push p.o. fluids -Elevate head of bed at night  Diabetes -Continue Lantus insulin 30 units at bedtime -Continue NovoLog sliding scale 3 times daily with meals and at bedtime -Continue CBG monitoring 3 times daily before meals  and at bedtime  MDD/GAD -Continue Zoloft 50 mg daily for now.  Plan to increase Zoloft to 100 mg daily once patient's headache and other symptoms from COVID infection have resolved. -Continue hydroxyzine 25 mg 3 times daily PRN anxiety  Insomnia -Continue trazodone 100 mg at bedtime -Continue trazodone 50 mg nightly PRN which may be given for sleep if standing dose trazodone not effective after 1 hour -Elevate the head of bed at night  -Patient has  possible comorbid undiagnosed sleep apnea.  I have discussed with patient that he should seek referral for sleep study from his PCP after discharge to assess for sleep apnea.  Seasonal allergies -Continue Claritin 10 mg daily  Hyperlipidemia -Continue Lipitor 40 mg daily  Patient remains on CIWA protocol with comfort meds and PRN lorazepam to cover for CIWA scores >12.  Suspect alcohol withdrawal is unlikely given patient's limited frequency and amount of alcohol consumption in recent years.  CIWA scores have been between 2 and 4 over the last 24 hours.  Discharge planning in progress  Arthor Captain, MD 05/04/2021, 1:10 PM

## 2021-05-04 NOTE — Progress Notes (Signed)
Reuben woke up with nasal congestion and headache.  Notified PA of nasal congestion.  Order obtained for Mucinex which was given with Ibuprofen 400mg .  Will monitor effectiveness.

## 2021-05-04 NOTE — Tx Team (Signed)
Interdisciplinary Treatment and Diagnostic Plan Update  05/04/2021 Time of Session: 9:45am George Howe MRN: [id number]  Principal Diagnosis: MDD (major depressive disorder), recurrent severe, without psychosis ([name])  Secondary Diagnoses: Principal Problem:   MDD (major depressive disorder), recurrent severe, without psychosis ([name]) Active Problems:   GAD (generalized anxiety disorder)   Current Medications:  Current Facility-Administered Medications  Medication Dose Route Frequency Provider Last Rate Last Admin   acetaminophen (TYLENOL) tablet 650 mg  650 mg Oral Q6H PRN Merlyn Lot E, NP   650 mg at 05/03/21 0341   albuterol (VENTOLIN HFA) 108 (90 Base) MCG/ACT inhaler 2 puff  2 puff Inhalation Q4H PRN Charlann Lange, PA-C   2 puff at 05/04/21 0819   alum & mag hydroxide-simeth (MAALOX/MYLANTA) 200-200-20 MG/5ML suspension 30 mL  30 mL Oral Q4H PRN Mallie Darting, NP       atorvastatin (LIPITOR) tablet 40 mg  40 mg Oral QHS Merlyn Lot E, NP   40 mg at 05/03/21 2215   guaiFENesin (MUCINEX) 12 hr tablet 1,200 mg  1,200 mg Oral BID PRN Prescilla Sours, PA-C   1,200 mg at 05/04/21 1348   hydrOXYzine (ATARAX/VISTARIL) tablet 25 mg  25 mg Oral TID PRN Mallie Darting, NP   25 mg at 05/04/21 7494   ibuprofen (ADVIL) tablet 400 mg  400 mg Oral BID PRN Lindell Spar I, NP   400 mg at 05/04/21 1350   insulin aspart (novoLOG) injection 0-15 Units  0-15 Units Subcutaneous TID WC Merlyn Lot E, NP   3 Units at 05/04/21 0709   insulin aspart (novoLOG) injection 0-5 Units  0-5 Units Subcutaneous QHS Merlyn Lot E, NP   2 Units at 04/30/21 2134   insulin glargine (LANTUS) injection 30 Units  30 Units Subcutaneous QHS Harlow Asa, MD   30 Units at 05/03/21 2216   loratadine (CLARITIN) tablet 10 mg  10 mg Oral Daily Merlyn Lot E, NP   10 mg at 05/04/21 0818   multivitamin with minerals tablet 1 tablet  1 tablet Oral Daily Ethelene Hal, NP   1 tablet at 05/04/21 4967   nicotine  (NICODERM CQ - dosed in mg/24 hours) patch 21 mg  21 mg Transdermal Once Merlyn Lot E, NP       nicotine (NICODERM CQ - dosed in mg/24 hours) patch 21 mg  21 mg Transdermal Daily Nelda Marseille, Amy E, MD   21 mg at 05/04/21 0820   sertraline (ZOLOFT) tablet 50 mg  50 mg Oral Daily Arthor Captain, MD   50 mg at 05/04/21 0818   thiamine tablet 100 mg  100 mg Oral Daily Ethelene Hal, NP   100 mg at 05/04/21 0818   traZODone (DESYREL) tablet 100 mg  100 mg Oral QHS Arthor Captain, MD   100 mg at 05/03/21 2215   traZODone (DESYREL) tablet 50 mg  50 mg Oral QHS PRN Arthor Captain, MD       PTA Medications: Medications Prior to Admission  Medication Sig Dispense Refill Last Dose   atorvastatin (LIPITOR) 40 MG tablet Take 1 tablet (40 mg total) by mouth daily. (Patient taking differently: Take 40 mg by mouth at bedtime.) 30 tablet 2    blood glucose meter kit and supplies KIT 1 each. Check blood sugar before meals and at bedtime      cetirizine (ZYRTEC) 10 MG tablet Take 10 mg by mouth daily as needed for allergies.      ibuprofen (  ADVIL) 200 MG tablet Take 800 mg by mouth every 6 (six) hours as needed for headache or moderate pain.      insulin aspart (NOVOLOG) 100 UNIT/ML FlexPen Inject 4 units with each meal. Additionally, add 3 units for each 40 points above 140. 12 mL 6    insulin glargine (LANTUS) 100 UNIT/ML Solostar Pen Inject 30 Units into the skin at bedtime. 21 mL 2    Insulin Pen Needle (PEN NEEDLES) 32G X 4 MM MISC Use 1 pen needle to inject insulin 4 times per day. 200 each 11    Multiple Vitamins-Minerals (MULTIVITAMIN WITH MINERALS) tablet Take 1 tablet by mouth daily.      sertraline (ZOLOFT) 50 MG tablet Take 1 tablet (50 mg total) by mouth daily. 90 tablet 1     Patient Stressors: Financial difficulties Health problems  Patient Strengths: Average or above average intelligence Capable of independent living Communication skills Motivation for  treatment/growth Supportive family/friends Work skills  Treatment Modalities: Medication Management, Group therapy, Case management,  1 to 1 session with clinician, Psychoeducation, Recreational therapy.   Physician Treatment Plan for Primary Diagnosis: MDD (major depressive disorder), recurrent severe, without psychosis ([name]) Long Term Goal(s): Improvement in symptoms so as ready for discharge   Short Term Goals: Ability to identify changes in lifestyle to reduce recurrence of condition will improve Ability to verbalize feelings will improve Ability to disclose and discuss suicidal ideas Ability to demonstrate self-control will improve Ability to identify and develop effective coping behaviors will improve Ability to maintain clinical measurements within normal limits will improve Compliance with prescribed medications will improve Ability to identify triggers associated with substance abuse/mental health issues will improve  Medication Management: Evaluate patient's response, side effects, and tolerance of medication regimen.  Therapeutic Interventions: 1 to 1 sessions, Unit Group sessions and Medication administration.  Evaluation of Outcomes: Progressing  Physician Treatment Plan for Secondary Diagnosis: Principal Problem:   MDD (major depressive disorder), recurrent severe, without psychosis ([name]) Active Problems:   GAD (generalized anxiety disorder)  Long Term Goal(s): Improvement in symptoms so as ready for discharge   Short Term Goals: Ability to identify changes in lifestyle to reduce recurrence of condition will improve Ability to verbalize feelings will improve Ability to disclose and discuss suicidal ideas Ability to demonstrate self-control will improve Ability to identify and develop effective coping behaviors will improve Ability to maintain clinical measurements within normal limits will improve Compliance with prescribed medications will improve Ability to  identify triggers associated with substance abuse/mental health issues will improve     Medication Management: Evaluate patient's response, side effects, and tolerance of medication regimen.  Therapeutic Interventions: 1 to 1 sessions, Unit Group sessions and Medication administration.  Evaluation of Outcomes: Progressing   RN Treatment Plan for Primary Diagnosis: MDD (major depressive disorder), recurrent severe, without psychosis ([place]) Long Term Goal(s): Knowledge of disease and therapeutic regimen to maintain health will improve  Short Term Goals: Ability to remain free from injury will improve, Ability to verbalize frustration and anger appropriately will improve, Ability to demonstrate self-control, Ability to identify and develop effective coping behaviors will improve, and Compliance with prescribed medications will improve  Medication Management: RN will administer medications as ordered by provider, will assess and evaluate patient's response and provide education to patient for prescribed medication. RN will report any adverse and/or side effects to prescribing provider.  Therapeutic Interventions: 1 on 1 counseling sessions, Psychoeducation, Medication administration, Evaluate responses to treatment, Monitor vital signs and CBGs as  ordered, Perform/monitor CIWA, COWS, AIMS and Fall Risk screenings as ordered, Perform wound care treatments as ordered.  Evaluation of Outcomes: Progressing   LCSW Treatment Plan for Primary Diagnosis: MDD (major depressive disorder), recurrent severe, without psychosis ([place]) Long Term Goal(s): Safe transition to appropriate next level of care at discharge, Engage patient in therapeutic group addressing interpersonal concerns.  Short Term Goals: Engage patient in aftercare planning with referrals and resources, Increase social support, Increase ability to appropriately verbalize feelings, Identify triggers associated with mental health/substance abuse  issues, and Increase skills for wellness and recovery  Therapeutic Interventions: Assess for all discharge needs, 1 to 1 time with Social worker, Explore available resources and support systems, Assess for adequacy in community support network, Educate family and significant other(s) on suicide prevention, Complete Psychosocial Assessment, Interpersonal group therapy.  Evaluation of Outcomes: Progressing   Progress in Treatment: Attending groups: No. Pt tested positive for COVID Participating in groups: No. Pt tested positive for COVID Taking medication as prescribed: Yes. Toleration medication: Yes. Family/Significant other contact made: No, will contact:  friend Patient understands diagnosis: Yes. Discussing patient identified problems/goals with staff: Yes. Medical problems stabilized or resolved: Yes. Denies suicidal/homicidal ideation: Yes. Issues/concerns per patient self-inventory: No.   New problem(s) identified: No, Describe:  none  New Short Term/Long Term Goal(s): medication stabilization, elimination of SI thoughts, development of comprehensive mental wellness plan.    Patient Goals:  "To be able to cope and learn to recognize when I need help"  Discharge Plan or Barriers: Patient recently admitted. CSW will continue to follow and assess for appropriate referrals and possible discharge planning.    Reason for Continuation of Hospitalization: Anxiety Depression Medication stabilization Suicidal ideation  Estimated Length of Stay: 3-5 days  Attendees: Patient: George Howe 05/05/2021   Physician: Fatima Sanger, DO 05/05/2021   Nursing:  05/05/2021   RN Care Manager: 05/05/2021   Social Worker: Darletta Moll, LCSW 05/05/2021   Recreational Therapist:  05/05/2021   Other:  05/05/2021   Other:  05/05/2021   Other: 05/05/2021     Scribe for Treatment Team: Mliss Fritz, [place] 05/04/2021 2:03 PM

## 2021-05-05 DIAGNOSIS — F332 Major depressive disorder, recurrent severe without psychotic features: Secondary | ICD-10-CM | POA: Diagnosis not present

## 2021-05-05 LAB — GLUCOSE, CAPILLARY
Glucose-Capillary: 159 mg/dL — ABNORMAL HIGH (ref 70–99)
Glucose-Capillary: 180 mg/dL — ABNORMAL HIGH (ref 70–99)
Glucose-Capillary: 158 mg/dL — ABNORMAL HIGH (ref 70–99)
Glucose-Capillary: 213 mg/dL — ABNORMAL HIGH (ref 70–99)

## 2021-05-05 NOTE — Progress Notes (Signed)
[facility] MD Progress Note  05/05/2021 4:29 PM Salar Molden  MRN:  [id number]  Reason for admission:  George Howe is a 35 year old male with a history of diabetes (recently diagnosed in April 2022), history of pancreatitis, hyperlipidemia, childhood trauma, GAD and MDD brought in by a friend to [facility] after a near suicide attempt in the context of stressors of recent diagnosis of DM, recent failed business venture, financial stressors and alcohol use on the evening of near attempt.  Patient reported that he was contemplating cutting his throat with a large knife and had the knife in his hand but did not cut himself because of his dog and instead called a friend for help who brought him to the ED.  Objective: Medical record reviewed.  Patient's case discussed in detail with members of the treatment team.  I met with and evaluated the patient on the unit for follow-up today.  Patient continues to present as anxious with constricted affect.  He reports poor sleep last night which she attributes to the symptoms from his COVID infection.  Patient states that his anxiety was worse yesterday.  He denies SI, AI, HI, PI or AVH.  He continues to experience fatigue, myalgias, chills, headache which are worse at night.  He denies nausea, vomiting, constipation, sore throat, cough, trouble breathing, chest pain, palpitations, confusion or other physical symptoms.  We again discussed whether to increase Zoloft today which could worsen his headache and patient stated his preference to defer dose increase.  Team discussed with patient whether patient would like to discharge and complete his quarantine in a hotel remain in the hospital.  Patient stated he did not want to inconvenience friends by asking them to bring him food if he were discharged to a hotel.  He prefers to remain in the hospital while he recovers for now.  I discussed with patient the importance of requesting referral from PCP for sleep study to assess for  possible sleep apnea after discharge since his poor sleep has been a chronic issue even prior to the current hospital admission.  Patient stated understanding.  The patient slept 3.5 hours last night.  He remains afebrile and vital signs have been stable.  Capillary blood glucose readings ranged from a low of 120 to a high of 246 over the last 24 hours.  CIWA scores: 6 at noon yesterday; 6 at 6 PM yesterday; 2 at midnight (scored for anxiety); 0 at 6 AM today.  Patient is taking scheduled medications as prescribed.  He took hydroxyzine 25 mg PRN twice yesterday and once thus far today.  He took trazodone 58m PRN last night for sleep in addition to his standing dose trazodone of 100 mg.  Principal Problem: MDD (major depressive disorder), recurrent severe, without psychosis ([place] Diagnosis: Principal Problem:   MDD (major depressive disorder), recurrent severe, without psychosis ([place] Active Problems:   GAD (generalized anxiety disorder)  Total Time spent with patient:  20 minutes  Past Psychiatric History: See admission H&P  Past Medical History:  Past Medical History:  Diagnosis Date   Chicken pox    Depression    Diabetes mellitus without complication ([place]    DM (diabetes mellitus) ([place]    Hay fever    Hyperlipidemia    Hyperlipidemia    Morbid obesity ([place]    History reviewed. No pertinent surgical history. Family History:  Family History  Problem Relation Age of Onset   Diabetes Father    Diabetes Paternal Grandmother    Family  Psychiatric  History: See admission H&P Social History:  Social History   Substance and Sexual Activity  Alcohol Use Yes   Comment: occasional     Social History   Substance and Sexual Activity  Drug Use Yes    Social History   Socioeconomic History   Marital status: Single    Spouse name: Not on file   Number of children: Not on file   Years of education: Not on file   Highest education level: Not on file  Occupational History   Not  on file  Tobacco Use   Smoking status: Every Day    Packs/day: 1.00    Years: 17.00    Pack years: 17.00    Types: Cigarettes   Smokeless tobacco: Never  Vaping Use   Vaping Use: Never used  Substance and Sexual Activity   Alcohol use: Yes    Comment: occasional   Drug use: Yes   Sexual activity: Yes  Other Topics Concern   Not on file  Social History Narrative   Not on file   Social Determinants of Health   Financial Resource Strain: Not on file  Food Insecurity: Not on file  Transportation Needs: Not on file  Physical Activity: Not on file  Stress: Not on file  Social Connections: Not on file   Additional Social History:                         Sleep: Poor  Appetite:  Good  Current Medications: Current Facility-Administered Medications  Medication Dose Route Frequency Provider Last Rate Last Admin   acetaminophen (TYLENOL) tablet 650 mg  650 mg Oral Q6H PRN Merlyn Lot E, NP   650 mg at 05/03/21 0341   albuterol (VENTOLIN HFA) 108 (90 Base) MCG/ACT inhaler 2 puff  2 puff Inhalation Q4H PRN Charlann Lange, PA-C   2 puff at 05/04/21 0819   alum & mag hydroxide-simeth (MAALOX/MYLANTA) 200-200-20 MG/5ML suspension 30 mL  30 mL Oral Q4H PRN Merlyn Lot E, NP       atorvastatin (LIPITOR) tablet 40 mg  40 mg Oral QHS Merlyn Lot E, NP   40 mg at 05/04/21 2146   guaiFENesin (MUCINEX) 12 hr tablet 1,200 mg  1,200 mg Oral BID PRN Prescilla Sours, PA-C   1,200 mg at 05/05/21 0155   hydrOXYzine (ATARAX/VISTARIL) tablet 25 mg  25 mg Oral TID PRN Merlyn Lot E, NP   25 mg at 05/05/21 0341   ibuprofen (ADVIL) tablet 400 mg  400 mg Oral BID PRN Lindell Spar I, NP   400 mg at 05/05/21 0155   insulin aspart (novoLOG) injection 0-15 Units  0-15 Units Subcutaneous TID WC Merlyn Lot E, NP   3 Units at 05/05/21 1220   insulin aspart (novoLOG) injection 0-5 Units  0-5 Units Subcutaneous QHS Merlyn Lot E, NP   2 Units at 05/04/21 2146   insulin glargine (LANTUS)  injection 30 Units  30 Units Subcutaneous QHS Harlow Asa, MD   30 Units at 05/04/21 2145   loratadine (CLARITIN) tablet 10 mg  10 mg Oral Daily Merlyn Lot E, NP   10 mg at 05/05/21 3790   multivitamin with minerals tablet 1 tablet  1 tablet Oral Daily Ethelene Hal, NP   1 tablet at 05/05/21 2409   nicotine (NICODERM CQ - dosed in mg/24 hours) patch 21 mg  21 mg Transdermal Once Mallie Darting, NP  nicotine (NICODERM CQ - dosed in mg/24 hours) patch 21 mg  21 mg Transdermal Daily Nelda Marseille, Amy E, MD   21 mg at 05/05/21 8264   sertraline (ZOLOFT) tablet 50 mg  50 mg Oral Daily Arthor Captain, MD   50 mg at 05/05/21 1583   thiamine tablet 100 mg  100 mg Oral Daily Ethelene Hal, NP   100 mg at 05/05/21 0940   traZODone (DESYREL) tablet 100 mg  100 mg Oral QHS Arthor Captain, MD   100 mg at 05/04/21 2146   traZODone (DESYREL) tablet 50 mg  50 mg Oral QHS PRN Arthor Captain, MD   50 mg at 05/05/21 0155    Lab Results:  Results for orders placed or performed during the hospital encounter of 04/30/21 (from the past 48 hour(s))  Glucose, capillary     Status: Abnormal   Collection Time: 05/03/21  5:17 PM  Result Value Ref Range   Glucose-Capillary 245 (H) 70 - 99 mg/dL    Comment: Glucose reference range applies only to samples taken after fasting for at least 8 hours.   Comment 1 Notify RN    Comment 2 Document in Chart   Glucose, capillary     Status: Abnormal   Collection Time: 05/03/21  9:11 PM  Result Value Ref Range   Glucose-Capillary 176 (H) 70 - 99 mg/dL    Comment: Glucose reference range applies only to samples taken after fasting for at least 8 hours.  Glucose, capillary     Status: Abnormal   Collection Time: 05/04/21  6:41 AM  Result Value Ref Range   Glucose-Capillary 175 (H) 70 - 99 mg/dL    Comment: Glucose reference range applies only to samples taken after fasting for at least 8 hours.  CBC with Differential/Platelet     Status: Abnormal    Collection Time: 05/04/21  6:42 AM  Result Value Ref Range   WBC 5.4 4.0 - 10.5 K/uL   RBC 5.62 4.22 - 5.81 MIL/uL   Hemoglobin 15.7 13.0 - 17.0 g/dL   HCT 47.7 39.0 - 52.0 %   MCV 84.9 80.0 - 100.0 fL   MCH 27.9 26.0 - 34.0 pg   MCHC 32.9 30.0 - 36.0 g/dL   RDW 12.6 11.5 - 15.5 %   Platelets 145 (L) 150 - 400 K/uL   nRBC 0.0 0.0 - 0.2 %   Neutrophils Relative % 69 %   Neutro Abs 3.7 1.7 - 7.7 K/uL   Lymphocytes Relative 21 %   Lymphs Abs 1.1 0.7 - 4.0 K/uL   Monocytes Relative 8 %   Monocytes Absolute 0.4 0.1 - 1.0 K/uL   Eosinophils Relative 1 %   Eosinophils Absolute 0.1 0.0 - 0.5 K/uL   Basophils Relative 0 %   Basophils Absolute 0.0 0.0 - 0.1 K/uL   Immature Granulocytes 1 %   Abs Immature Granulocytes 0.07 0.00 - 0.07 K/uL    Comment: Performed at [facility], [place] [address] [place], [place] [address]  Hepatic function panel     Status: Abnormal   Collection Time: 05/04/21  6:42 AM  Result Value Ref Range   Total Protein 6.9 6.5 - 8.1 g/dL   Albumin 4.1 3.5 - 5.0 g/dL   AST 37 15 - 41 U/L   ALT 92 (H) 0 - 44 U/L   Alkaline Phosphatase 68 38 - 126 U/L   Total Bilirubin 0.8 0.3 - 1.2 mg/dL   Bilirubin, Direct  0.2 0.0 - 0.2 mg/dL   Indirect Bilirubin 0.6 0.3 - 0.9 mg/dL    Comment: Performed at [facility], [place] [address], [place], [place] [address]  Glucose, capillary     Status: Abnormal   Collection Time: 05/04/21 12:30 PM  Result Value Ref Range   Glucose-Capillary 120 (H) 70 - 99 mg/dL    Comment: Glucose reference range applies only to samples taken after fasting for at least 8 hours.  Glucose, capillary     Status: Abnormal   Collection Time: 05/04/21  4:49 PM  Result Value Ref Range   Glucose-Capillary 166 (H) 70 - 99 mg/dL    Comment: Glucose reference range applies only to samples taken after fasting for at least 8 hours.  Glucose, capillary     Status: Abnormal   Collection Time: 05/04/21  8:24 PM  Result Value  Ref Range   Glucose-Capillary 246 (H) 70 - 99 mg/dL    Comment: Glucose reference range applies only to samples taken after fasting for at least 8 hours.  Glucose, capillary     Status: Abnormal   Collection Time: 05/05/21  5:55 AM  Result Value Ref Range   Glucose-Capillary 159 (H) 70 - 99 mg/dL    Comment: Glucose reference range applies only to samples taken after fasting for at least 8 hours.   Comment 1 Notify RN   Glucose, capillary     Status: Abnormal   Collection Time: 05/05/21 12:08 PM  Result Value Ref Range   Glucose-Capillary 180 (H) 70 - 99 mg/dL    Comment: Glucose reference range applies only to samples taken after fasting for at least 8 hours.    Blood Alcohol level:  Lab Results  Component Value Date   ETH 45 (H) 49/17/9150    Metabolic Disorder Labs: Lab Results  Component Value Date   HGBA1C 6.5 (H) 04/30/2021   MPG 139.85 04/30/2021   MPG 301 01/07/2021   No results found for: PROLACTIN Lab Results  Component Value Date   CHOL 118 05/02/2021   TRIG 144 05/02/2021   HDL 36 (L) 05/02/2021   CHOLHDL 3.3 05/02/2021   VLDL 29 05/02/2021   LDLCALC 53 05/02/2021   LDLCALC 155 (H) 01/11/2021    Physical Findings: AIMS:  , ,  ,  ,    CIWA:  CIWA-Ar Total: 0 COWS:     Musculoskeletal: Strength & Muscle Tone: within normal limits Gait & Station: normal Patient leans: N/A  Psychiatric Specialty Exam:  Presentation  General Appearance: Appropriate for Environment  Eye Contact:Fair  Speech:Clear and Coherent; Normal Rate  Speech Volume:Normal  Handedness: No data recorded  Mood and Affect  Mood:Anxious  Affect:Congruent   Thought Process  Thought Processes:Coherent  Descriptions of Associations:Intact  Orientation:Full (Time, Place and Person)  Thought Content:Logical; WDL  History of Schizophrenia/Schizoaffective disorder:No  Duration of Psychotic Symptoms:No data recorded Hallucinations:Hallucinations: None  Ideas of  Reference:None  Suicidal Thoughts:Suicidal Thoughts: No  Homicidal Thoughts:Homicidal Thoughts: No   Sensorium  Memory:Immediate Good; Recent Good  Judgment:Good  Insight:Good   Executive Functions  Concentration:Good  Attention Span:Good  [place] of Knowledge:Good  Language:Good   Psychomotor Activity  Psychomotor Activity:Psychomotor Activity: Normal   Assets  Assets:Communication Skills; Desire for Improvement; Housing; Social Support; Talents/Skills; Vocational/Educational   Sleep  Sleep:Sleep: Poor Number of Hours of Sleep: 3.5    Physical Exam: Physical Exam Vitals and nursing note reviewed.  Constitutional:      General: He is not in  acute distress.    Appearance: He is not diaphoretic.  HENT:     Head: Normocephalic and atraumatic.  Cardiovascular:     Rate and Rhythm: Normal rate.  Pulmonary:     Effort: Pulmonary effort is normal.  Neurological:     General: No focal deficit present.     Mental Status: He is alert and oriented to person, place, and time.   Review of Systems  Constitutional:  Positive for chills, diaphoresis and malaise/fatigue. Negative for fever.  HENT:  Negative for congestion and sore throat.   Respiratory:  Negative for cough, shortness of breath and wheezing.   Cardiovascular:  Negative for chest pain and palpitations.  Gastrointestinal:  Positive for diarrhea. Negative for constipation, nausea and vomiting.  Genitourinary: Negative.   Musculoskeletal:  Positive for myalgias.       Positive for chronic bilateral pain in hands   Skin:  Negative for rash.  Neurological:  Positive for headaches. Negative for dizziness and tremors.  Psychiatric/Behavioral:  Positive for depression. Negative for hallucinations and suicidal ideas. The patient is nervous/anxious and has insomnia.   Blood pressure (!) 128/94, pulse 83, temperature 97.7 F (36.5 C), resp. rate 16, height 5' 2"  (1.575 m), weight 97.5 kg, SpO2 99 %.  Body mass index is 39.32 kg/m.   Treatment Plan Summary: Patient is a 35 year old male with MDD and GAD admitted with worsening symptoms of depression and near suicide attempt.  He tested positive for COVID-19 on the morning of 05/02/2021 which was his second day of hospitalization.  He has had intermittent symptoms consistent with COVID-positive status since that time.  Increase in Zoloft dose to target symptoms of anxiety and depression has been deferred so as not to exacerbate headache and other discomfort patient is experiencing from current COVID infection.  Patient reports feeling too anxious right now to discharge and complete quarantine outside the hospital.  We will continue current inpatient management at this time.  Daily contact with patient to assess and evaluate symptoms and progress in treatment and Medication management  Continue IVC status.    COVID+ -Positive SARS Coronavirus 2 by RT PCR test performed on 05/02/2021. -Patient is quarantining in his room with airborne and contact precautions.  He has remained afebrile since morning of 05/02/2021 (the morning of positive COVID test) -CXR on the evening of 05/02/2021 was negative -Continue albuterol inhaler 2 puffs every 4 hours PRN wheezing, shortness of breath, chest tightness -Continue Mucinex 1200 mg twice daily PRN congestion -Continue acetaminophen 650 mg every 6 hours PRN pain -Continue ibuprofen 400 mg twice daily PRN moderate pain, headaches -Push p.o. fluids -Elevate head of bed at night  Diabetes -Continue Lantus insulin 30 units at bedtime -Continue NovoLog sliding scale 3 times daily with meals and at bedtime -Continue CBG monitoring 3 times daily before meals and at bedtime  MDD/GAD -Continue Zoloft 50 mg daily for now.  Plan to increase Zoloft to 100 mg daily once patient's headache and other symptoms from COVID infection have resolved. -Continue hydroxyzine 25 mg 3 times daily PRN  anxiety  Insomnia -Continue trazodone 100 mg at bedtime -Continue trazodone 50 mg nightly PRN which may be given for sleep if standing dose trazodone not effective after 1 hour -Elevate the head of bed at night  -Patient has possible comorbid undiagnosed sleep apnea.  I have discussed with patient that he should seek referral for sleep study from his PCP after discharge to assess for sleep apnea.  Seasonal allergies -Continue Claritin  10 mg daily  Hyperlipidemia -Continue Lipitor 40 mg daily  Patient remains on CIWA protocol with comfort meds and PRN lorazepam to cover for CIWA scores >12.  Suspect alcohol withdrawal is unlikely given patient's limited frequency and amount of alcohol consumption in recent years.  CIWA scores have been no higher than 6 yesterday afternoon and no higher than 2 thus far today.  Discharge planning in progress  Arthor Captain, MD 05/05/2021, 4:29 PM

## 2021-05-05 NOTE — [facility] Group Notes (Signed)
Type of Therapy and Topic: Group Therapy: Overcoming Obstacles  Participation Level: Did Not Attend  Description of Group: In this group patients will be encouraged to explore what they see as obstacles to their own wellness and recovery. They will be guided to discuss their thoughts, feelings, and behaviors related to these obstacles. The group will process together ways to cope with barriers, with attention given to specific choices patients can make. Each patient will be challenged to identify changes they are motivated to make in order to overcome their obstacles. This group will be process-oriented, with patients participating in exploration of their own experiences as well as giving and receiving support and challenge from other group members.  Therapeutic Goals: 1. Patient will identify personal and current obstacles as they relate to admission. 2. Patient will identify barriers that currently interfere with their wellness or overcoming obstacles. 3. Patient will identify feelings, thought process and behaviors related to these barriers. 4. Patient will identify two changes they are willing to make to overcome these obstacles:  Summary of Patient Progress: Did not attend due to Covid-19 quarantine.

## 2021-05-05 NOTE — Progress Notes (Signed)
Met with patient to discuss disposition options.  Discussed if psychiatrically cleared, patient ability to get someone to support him if he finished quarantine in a hotel provided by [employer].  Patient expressed anxiety over inconveniencing others to support him. Patient reported he was doing okay in isolation in his room at this time.  CSW relayed information to patient provider.    Sophiamarie Nease, LCSW, [place] Social Worker  [facility]

## 2021-05-05 NOTE — Progress Notes (Signed)
Patient did not attend the evening speaker AA meeting.  

## 2021-05-05 NOTE — Progress Notes (Signed)
Pt said that he is feeling better from COVID symptoms today, but he still endorses having fatigue and body aches throughout the day.  Pt denies SI/HI/AVH.  Pt has been in his room and is trying to divert his mind by doing crossword puzzles and reading.  Pt's blood sugars have remained stable and vital signs are also stable.  RN will continue to monitor and provide support as needed.  05/05/21 0830  Psych Admission Type (Psych Patients Only)  Admission Status Involuntary  Psychosocial Assessment  Patient Complaints Sleep disturbance;Isolation;Depression;Malaise  Eye Contact Fair  Facial Expression Anxious  Affect Appropriate to circumstance  Speech Logical/coherent  Interaction Assertive  Motor Activity Slow  Appearance/Hygiene Unremarkable  Behavior Characteristics Cooperative;Anxious  Mood Anxious;Depressed  Thought Process  Coherency WDL  Content WDL  Delusions None reported or observed  Perception WDL  Hallucination None reported or observed  Judgment WDL  Confusion None  Danger to Self  Current suicidal ideation? Denies  Danger to Others  Danger to Others None reported or observed

## 2021-05-05 NOTE — Progress Notes (Signed)
George Howe reports headache and nasal congestion.  He felt like he had a fever earlier but V/S are stable.  He denies SI/HI or AVH.  He reported his anxiety is 7/10 and depression 2/10 (10 the worst).  He's having difficulty sleeping  and repeat dosage of trazodone given with no relief.  Q 15 minute checks maintained for safety.     05/05/21 0000  Psych Admission Type (Psych Patients Only)  Admission Status Voluntary  Psychosocial Assessment  Patient Complaints Anxiety;Malaise  Eye Contact Fair  Facial Expression Anxious  Affect Appropriate to circumstance  Speech Logical/coherent  Interaction Assertive  Motor Activity Slow  Appearance/Hygiene Unremarkable  Behavior Characteristics Cooperative;Anxious  Mood Anxious  Thought Process  Coherency WDL  Content WDL  Delusions None reported or observed  Perception WDL  Hallucination None reported or observed  Judgment Impaired  Confusion None  Danger to Self  Current suicidal ideation? Denies  Danger to Others  Danger to Others None reported or observed

## 2021-05-06 DIAGNOSIS — F332 Major depressive disorder, recurrent severe without psychotic features: Secondary | ICD-10-CM | POA: Diagnosis not present

## 2021-05-06 LAB — GLUCOSE, CAPILLARY
Glucose-Capillary: 155 mg/dL — ABNORMAL HIGH (ref 70–99)
Glucose-Capillary: 149 mg/dL — ABNORMAL HIGH (ref 70–99)
Glucose-Capillary: 130 mg/dL — ABNORMAL HIGH (ref 70–99)
Glucose-Capillary: 150 mg/dL — ABNORMAL HIGH (ref 70–99)

## 2021-05-06 NOTE — Progress Notes (Signed)
Psychoeducational Group Note  Date:  05/06/2021 Time:  2015  Group Topic/Focus:  Wrap up group  Participation Level: Did Not Attend  Participation Quality:  Not Applicable  Affect:  Not Applicable  Cognitive:  Not Applicable  Insight:  Not Applicable  Engagement in Group: Not Applicable  Additional Comments:  Did not attend. Pt on contact precautions.   [name] 05/06/2021, 9:04 PM

## 2021-05-06 NOTE — [facility] Group Notes (Addendum)
LCSW Group Therapy Note 05/06/2021  10:00-11:00am  Type of Therapy and Topic:  Group Therapy: Anger and Commonalities  Participation Level:  Did Not Attend   Description of Group: In this group, patients initially shared an "unknown" fact about themselves and CSW led a discussion about the ways in which we have things in common without realizing it.  Patient then identified a recent time they became angry and how this yet again showed a way in which they had something in common with other patients.  We discussed possible unhealthy reactions to anger and possible healthy reactions.  We also discussed possible underlying emotions that lead to the anger.  Commonalities among group members were pointed out throughout the entirety of group.  Therapeutic Goals: Patients were asked to share something about themselves and learned that they often have things in common with other people without knowing this Patients will remember their last incident of anger and how they reacted Patients will be able to identify their reaction as healthy or unhealthy, and identify possible reactions that would have been the opposite Patients will learn that anger itself is a secondary emotion and will think about their primary emotion at the time of their last incident of anger  Summary of Patient Progress:  The patient could not attend group due to isolation precautions  Therapeutic Modalities:   Cognitive Behavioral Therapy  Lynnell Chad, LCSW 05/06/2021  9:43 AM

## 2021-05-06 NOTE — Progress Notes (Signed)
   05/06/21 0014  Psych Admission Type (Psych Patients Only)  Admission Status Involuntary  Psychosocial Assessment  Patient Complaints Other (Comment) (nausea)  Eye Contact Fair  Facial Expression Other (Comment) (smiling)  Affect Appropriate to circumstance  Speech Logical/coherent  Interaction Assertive  Appearance/Hygiene Unremarkable  Behavior Characteristics Cooperative;Appropriate to situation  Mood Pleasant;Other (Comment) (reports mood much improved with no SI)  Thought Process  Coherency WDL  Content WDL  Delusions WDL  Perception WDL  Hallucination None reported or observed  Judgment WDL  Confusion WDL  Danger to Self  Current suicidal ideation? Denies  Danger to Others  Danger to Others None reported or observed

## 2021-05-06 NOTE — Progress Notes (Addendum)
   05/06/21 1200  Psych Admission Type (Psych Patients Only)  Admission Status Involuntary  Psychosocial Assessment  Patient Complaints None  Eye Contact Fair  Facial Expression Other (Comment) (smiling)  Affect Appropriate to circumstance  Speech Logical/coherent  Interaction Assertive  Motor Activity Slow  Appearance/Hygiene Unremarkable  Behavior Characteristics Appropriate to situation;Cooperative  Mood Pleasant  Thought Process  Coherency WDL  Content WDL  Delusions WDL  Perception WDL  Hallucination None reported or observed  Judgment WDL  Confusion WDL  Danger to Self  Current suicidal ideation? Denies  Danger to Others  Danger to Others None reported or observed    D. Pt presents awake and alert, sitting on his bed doing word puzzles. Pt reported that he is physically feeling much better, and reported that his anxiety has significantly decreased Pt rated his depression, hopelessness and anxiety a 0/0/1, respectively. Pt stated that his goal was to "continue working on getting on a normal sleeping routine, by staying awake and active through the day". Pt currently denies SI/HI and AVH  A. Labs and vitals monitored. Pt given and educated on medications. Pt supported emotionally and encouraged to express concerns and ask questions.   R. Pt remains safe with 15 minute checks. Will continue POC.

## 2021-05-06 NOTE — Progress Notes (Signed)
   05/06/21 2235  Psych Admission Type (Psych Patients Only)  Admission Status Involuntary  Psychosocial Assessment  Patient Complaints None  Eye Contact Fair  Facial Expression Other (Comment) (WDL)  Affect Appropriate to circumstance  Speech Logical/coherent  Interaction Assertive  Motor Activity Other (Comment) (WDL)  Appearance/Hygiene Unremarkable  Behavior Characteristics Appropriate to situation  Mood Pleasant  Thought Process  Coherency WDL  Content WDL  Delusions None reported or observed  Perception WDL  Hallucination None reported or observed  Judgment WDL  Confusion None  Danger to Self  Current suicidal ideation? Denies  Danger to Others  Danger to Others None reported or observed

## 2021-05-06 NOTE — Progress Notes (Signed)
[facility] MD Progress Note  05/06/2021 12:17 PM George Howe  MRN:  [id number] Subjective: Patient is a 35 year old male with a past psychiatric history significant for major depression as well as generalized anxiety disorder who was brought to the [facility] emergency department after a near suicide attempt in the context of stressors of recent diagnosis of diabetes mellitus, recent psychosocial stressors such as failed business, financial stressors and alcohol use.  Objective: Patient is seen and examined.  Patient is a 35 year old male with the above-stated past psychiatric history who is seen in follow-up.  I am covering for Dr. Fayrene Fearing who is off for the weekend.  He stated he is doing well.  He denied any side effects to his current medications.  He stated his mood was good.  He stated that he was surprised that his anxiety was doing well given the fact that he is on confinement because of having a positive COVID test.  He also reported being completely asymptomatic from his COVID test.  He did have a low-grade fever on admission, but otherwise has slowly improved over the last 5 days.  We discussed possible discharge plans.  We discussed the fact that we will test him tomorrow for COVID and if negative we will be able to get him home.  Otherwise it may be another day or so.  He stated that the friend who is going to transport him is going out of town on Monday, and so if at all possible he like to be able to be discharged tomorrow.  No suicidal ideation, homicidal ideation or psychosis.  No side effects to his current medications.  His current medications include Lipitor, albuterol, Mucinex, hydroxyzine, ibuprofen, sliding scale insulin, Lantus insulin 30 units subcu nightly and sertraline 50 mg p.o. daily.  His blood pressure is mildly elevated today at 128/94.  He is afebrile.  Pulse was 83.  He slept 5.5 hours last night.  His blood sugar this morning is 149.  His CBC from 8/11 was completely normal with a  low normal platelet count at 145,000.  His platelet count previously was 179,000.  Differential was normal as well.  Principal Problem: MDD (major depressive disorder), recurrent severe, without psychosis (HCC) Diagnosis: Principal Problem:   MDD (major depressive disorder), recurrent severe, without psychosis (HCC) Active Problems:   GAD (generalized anxiety disorder)  Total Time spent with patient: 20 minutes  Past Psychiatric History: See admission H&P  Past Medical History:  Past Medical History:  Diagnosis Date   Chicken pox    Depression    Diabetes mellitus without complication (HCC)    DM (diabetes mellitus) (HCC)    Hay fever    Hyperlipidemia    Hyperlipidemia    Morbid obesity (HCC)    History reviewed. No pertinent surgical history. Family History:  Family History  Problem Relation Age of Onset   Diabetes Father    Diabetes Paternal Grandmother    Family Psychiatric  History: See admission H&P Social History:  Social History   Substance and Sexual Activity  Alcohol Use Yes   Comment: occasional     Social History   Substance and Sexual Activity  Drug Use Yes    Social History   Socioeconomic History   Marital status: Single    Spouse name: Not on file   Number of children: Not on file   Years of education: Not on file   Highest education level: Not on file  Occupational History   Not on file  Tobacco Use   Smoking status: Every Day    Packs/day: 1.00    Years: 17.00    Pack years: 17.00    Types: Cigarettes   Smokeless tobacco: Never  Vaping Use   Vaping Use: Never used  Substance and Sexual Activity   Alcohol use: Yes    Comment: occasional   Drug use: Yes   Sexual activity: Yes  Other Topics Concern   Not on file  Social History Narrative   Not on file   Social Determinants of Health   Financial Resource Strain: Not on file  Food Insecurity: Not on file  Transportation Needs: Not on file  Physical Activity: Not on file   Stress: Not on file  Social Connections: Not on file   Additional Social History:                         Sleep: Fair  Appetite:  Good  Current Medications: Current Facility-Administered Medications  Medication Dose Route Frequency Provider Last Rate Last Admin   acetaminophen (TYLENOL) tablet 650 mg  650 mg Oral Q6H PRN Ophelia Shoulder E, NP   650 mg at 05/03/21 0341   albuterol (VENTOLIN HFA) 108 (90 Base) MCG/ACT inhaler 2 puff  2 puff Inhalation Q4H PRN Elpidio Anis, PA-C   2 puff at 05/04/21 0819   alum & mag hydroxide-simeth (MAALOX/MYLANTA) 200-200-20 MG/5ML suspension 30 mL  30 mL Oral Q4H PRN Ophelia Shoulder E, NP       atorvastatin (LIPITOR) tablet 40 mg  40 mg Oral QHS Ophelia Shoulder E, NP   40 mg at 05/05/21 2209   guaiFENesin (MUCINEX) 12 hr tablet 1,200 mg  1,200 mg Oral BID PRN Jaclyn Shaggy, PA-C   1,200 mg at 05/05/21 0155   hydrOXYzine (ATARAX/VISTARIL) tablet 25 mg  25 mg Oral TID PRN Chales Abrahams, NP   25 mg at 05/05/21 2210   ibuprofen (ADVIL) tablet 400 mg  400 mg Oral BID PRN Armandina Stammer I, NP   400 mg at 05/05/21 0155   insulin aspart (novoLOG) injection 0-15 Units  0-15 Units Subcutaneous TID WC Ophelia Shoulder E, NP   3 Units at 05/06/21 0612   insulin aspart (novoLOG) injection 0-5 Units  0-5 Units Subcutaneous QHS Ophelia Shoulder E, NP   2 Units at 05/05/21 2211   insulin glargine (LANTUS) injection 30 Units  30 Units Subcutaneous QHS Comer Locket, MD   30 Units at 05/05/21 2211   loratadine (CLARITIN) tablet 10 mg  10 mg Oral Daily Ophelia Shoulder E, NP   10 mg at 05/06/21 7893   multivitamin with minerals tablet 1 tablet  1 tablet Oral Daily Laveda Abbe, NP   1 tablet at 05/06/21 8101   nicotine (NICODERM CQ - dosed in mg/24 hours) patch 21 mg  21 mg Transdermal Once Ophelia Shoulder E, NP       nicotine (NICODERM CQ - dosed in mg/24 hours) patch 21 mg  21 mg Transdermal Daily Mason Jim, Amy E, MD   21 mg at 05/06/21 0818   sertraline  (ZOLOFT) tablet 50 mg  50 mg Oral Daily Claudie Revering, MD   50 mg at 05/06/21 7510   thiamine tablet 100 mg  100 mg Oral Daily Laveda Abbe, NP   100 mg at 05/06/21 0819   traZODone (DESYREL) tablet 100 mg  100 mg Oral QHS Claudie Revering, MD   100 mg at 05/05/21 2208  traZODone (DESYREL) tablet 50 mg  50 mg Oral QHS PRN Claudie Revering, MD   50 mg at 05/05/21 0155    Lab Results:  Results for orders placed or performed during the hospital encounter of 04/30/21 (from the past 48 hour(s))  Glucose, capillary     Status: Abnormal   Collection Time: 05/04/21 12:30 PM  Result Value Ref Range   Glucose-Capillary 120 (H) 70 - 99 mg/dL    Comment: Glucose reference range applies only to samples taken after fasting for at least 8 hours.  Glucose, capillary     Status: Abnormal   Collection Time: 05/04/21  4:49 PM  Result Value Ref Range   Glucose-Capillary 166 (H) 70 - 99 mg/dL    Comment: Glucose reference range applies only to samples taken after fasting for at least 8 hours.  Glucose, capillary     Status: Abnormal   Collection Time: 05/04/21  8:24 PM  Result Value Ref Range   Glucose-Capillary 246 (H) 70 - 99 mg/dL    Comment: Glucose reference range applies only to samples taken after fasting for at least 8 hours.  Glucose, capillary     Status: Abnormal   Collection Time: 05/05/21  5:55 AM  Result Value Ref Range   Glucose-Capillary 159 (H) 70 - 99 mg/dL    Comment: Glucose reference range applies only to samples taken after fasting for at least 8 hours.   Comment 1 Notify RN   Glucose, capillary     Status: Abnormal   Collection Time: 05/05/21 12:08 PM  Result Value Ref Range   Glucose-Capillary 180 (H) 70 - 99 mg/dL    Comment: Glucose reference range applies only to samples taken after fasting for at least 8 hours.  Glucose, capillary     Status: Abnormal   Collection Time: 05/05/21  4:57 PM  Result Value Ref Range   Glucose-Capillary 158 (H) 70 - 99 mg/dL     Comment: Glucose reference range applies only to samples taken after fasting for at least 8 hours.  Glucose, capillary     Status: Abnormal   Collection Time: 05/05/21  8:48 PM  Result Value Ref Range   Glucose-Capillary 213 (H) 70 - 99 mg/dL    Comment: Glucose reference range applies only to samples taken after fasting for at least 8 hours.   Comment 1 Notify RN    Comment 2 Document in Chart   Glucose, capillary     Status: Abnormal   Collection Time: 05/06/21  6:00 AM  Result Value Ref Range   Glucose-Capillary 155 (H) 70 - 99 mg/dL    Comment: Glucose reference range applies only to samples taken after fasting for at least 8 hours.   Comment 1 Notify RN    Comment 2 Document in Chart   Glucose, capillary     Status: Abnormal   Collection Time: 05/06/21 11:54 AM  Result Value Ref Range   Glucose-Capillary 149 (H) 70 - 99 mg/dL    Comment: Glucose reference range applies only to samples taken after fasting for at least 8 hours.    Blood Alcohol level:  Lab Results  Component Value Date   ETH 45 (H) 04/30/2021    Metabolic Disorder Labs: Lab Results  Component Value Date   HGBA1C 6.5 (H) 04/30/2021   MPG 139.85 04/30/2021   MPG 301 01/07/2021   No results found for: PROLACTIN Lab Results  Component Value Date   CHOL 118 05/02/2021   TRIG 144 05/02/2021  HDL 36 (L) 05/02/2021   CHOLHDL 3.3 05/02/2021   VLDL 29 05/02/2021   LDLCALC 53 05/02/2021   LDLCALC 155 (H) 01/11/2021    Physical Findings: AIMS:  , ,  ,  ,    CIWA:  CIWA-Ar Total: 0 COWS:     Musculoskeletal: Strength & Muscle Tone: within normal limits Gait & Station: normal Patient leans: N/A  Psychiatric Specialty Exam:  Presentation  General Appearance: Appropriate for Environment  Eye Contact:Fair  Speech:Clear and Coherent; Normal Rate  Speech Volume:Normal  Handedness: No data recorded  Mood and Affect  Mood:Anxious  Affect:Congruent   Thought Process  Thought  Processes:Coherent  Descriptions of Associations:Intact  Orientation:Full (Time, Place and Person)  Thought Content:Logical; WDL  History of Schizophrenia/Schizoaffective disorder:No  Duration of Psychotic Symptoms:No data recorded Hallucinations:Hallucinations: None  Ideas of Reference:None  Suicidal Thoughts:Suicidal Thoughts: No  Homicidal Thoughts:Homicidal Thoughts: No   Sensorium  Memory:Immediate Good; Recent Good  Judgment:Good  Insight:Good   Executive Functions  Concentration:Good  Attention Span:Good  Recall:Good  Fund of Knowledge:Good  Language:Good   Psychomotor Activity  Psychomotor Activity:Psychomotor Activity: Normal   Assets  Assets:Communication Skills; Desire for Improvement; Housing; Social Support; Talents/Skills; Vocational/Educational   Sleep  Sleep:Sleep: Poor Number of Hours of Sleep: 3.5    Physical Exam: Physical Exam Vitals and nursing note reviewed.  Constitutional:      Appearance: Normal appearance.  Pulmonary:     Effort: Pulmonary effort is normal.  Neurological:     General: No focal deficit present.     Mental Status: He is alert and oriented to person, place, and time.   Review of Systems  All other systems reviewed and are negative. Blood pressure (!) 128/94, pulse 83, temperature 97.7 F (36.5 C), resp. rate 16, height 5\' 2"  (1.575 m), weight 97.5 kg, SpO2 99 %. Body mass index is 39.32 kg/m.   Treatment Plan Summary: Daily contact with patient to assess and evaluate symptoms and progress in treatment, Medication management, and Plan patient is seen and examined.  Patient is a 35 year old male with the above-stated past psychiatric history who is seen in follow-up.  Diagnosis: 1.  Major depression, recurrent, severe without psychotic features 2.  Generalized anxiety disorder 3.  Alcohol use disorder 4.  Marijuana use disorder 5.  COVID-19 6.  Diabetes mellitus type 2 7.  Mildly elevated blood  pressure  Pertinent findings on examination today: 1.  Patient denies suicidal or homicidal ideation. 2.  Patient stated anxiety is well controlled and no depressive symptoms. 3.  He denied any suicidal or homicidal ideation. 4.  No evidence of psychosis. 5.  COVID-19 is completely asymptomatic and afebrile at this point. 6.  Diabetic control appears to be good.  Plan: 1.  We will check his COVID test in the a.m. tomorrow.  That will be 5 days after his initial diagnosis. 2.  Continue Zoloft 50 mg p.o. daily for depression and anxiety. 3.  Continue albuterol HFA as needed for shortness of breath or wheezing. 4.  Continue Lipitor 40 mg p.o. nightly for hyperlipidemia. 5.  Continue Mucinex 1200 mg p.o. twice daily as needed cough, wheezing or congestion. 6.  Continue Vistaril 25 mg p.o. 3 times daily as needed anxiety. 7.  Continue ibuprofen 400 mg p.o. twice daily as needed pain, headache or fever. 8.  Continue sliding scale insulin. 9.  Continue Lantus insulin 30 units subcu nightly for diabetes mellitus type 2 10.  Continue Claritin 10 mg p.o. daily for seasonal allergies.  11.  Change trazodone to 150 mg insomnia. 12.  Disposition planning-in progress.  Antonieta Pert, MD 05/06/2021, 12:17 PM

## 2021-05-07 DIAGNOSIS — F332 Major depressive disorder, recurrent severe without psychotic features: Secondary | ICD-10-CM | POA: Diagnosis not present

## 2021-05-07 LAB — GLUCOSE, CAPILLARY
Glucose-Capillary: 124 mg/dL — ABNORMAL HIGH (ref 70–99)
Glucose-Capillary: 130 mg/dL — ABNORMAL HIGH (ref 70–99)
Glucose-Capillary: 97 mg/dL (ref 70–99)
Glucose-Capillary: 143 mg/dL — ABNORMAL HIGH (ref 70–99)

## 2021-05-07 NOTE — [facility] Group Notes (Signed)
[facility] Group Notes:  (Nursing/MHT/Case Management/Adjunct)  Date:  05/07/2021  Time:  10:00 AM  Type of Therapy:  Group Therapy  Participation Level:  Active  Participation Quality:  Appropriate  Affect:  Appropriate  Cognitive:  Appropriate  Insight:  Appropriate  Engagement in Group:  Engaged  Modes of Intervention:  Discussion  Summary of Progress/Problems:Pt was hoping his test come back neg for discharge, no other concerns.  Jaquita Rector 05/07/2021, 10:00 AM

## 2021-05-07 NOTE — [facility] Group Notes (Signed)
Pt did not attend group due to covid.

## 2021-05-07 NOTE — Progress Notes (Signed)
[facility] MD Progress Note  05/07/2021 11:34 AM Dequane Strahan  MRN:  [id number] Subjective:  Patient is a 35 year old male with a past psychiatric history significant for major depression as well as generalized anxiety disorder who was brought to the [facility] emergency department after a near suicide attempt in the context of stressors of recent diagnosis of diabetes mellitus, recent psychosocial stressors such as failed business, financial stressors and alcohol use.  Objective: Patient is seen and examined.  Patient is a 35 year old male with the above-stated past psychiatric history who is seen in follow-up.  We were hoping that his COVID would be negative today, but unfortunately it is positive.  We discussed that.  His psychiatric symptoms remain completely stable at this point.  From a COVID standpoint he is also asymptomatic.  He remains afebrile.  He is not having any problems with that.  We discussed with staff the possibility of taking him outside today by himself given his isolation where he is at right now.  No suicidal or homicidal ideation.  No auditory or visual hallucinations.  His vital signs are stable, he is afebrile.  Pulse oximetry on room air was 100%.  His sleep continues to be somewhat problematic and he only got 4.5 hours last night.  His blood sugar this morning is 124.  The only other new lab is his COVID which is positive today.  Principal Problem: MDD (major depressive disorder), recurrent severe, without psychosis (HCC) Diagnosis: Principal Problem:   MDD (major depressive disorder), recurrent severe, without psychosis (HCC) Active Problems:   GAD (generalized anxiety disorder)  Total Time spent with patient: 20 minutes  Past Psychiatric History: See admission H&P  Past Medical History:  Past Medical History:  Diagnosis Date   Chicken pox    Depression    Diabetes mellitus without complication (HCC)    DM (diabetes mellitus) (HCC)    Hay fever    Hyperlipidemia     Hyperlipidemia    Morbid obesity (HCC)    History reviewed. No pertinent surgical history. Family History:  Family History  Problem Relation Age of Onset   Diabetes Father    Diabetes Paternal Grandmother    Family Psychiatric  History: See admission H&P Social History:  Social History   Substance and Sexual Activity  Alcohol Use Yes   Comment: occasional     Social History   Substance and Sexual Activity  Drug Use Yes    Social History   Socioeconomic History   Marital status: Single    Spouse name: Not on file   Number of children: Not on file   Years of education: Not on file   Highest education level: Not on file  Occupational History   Not on file  Tobacco Use   Smoking status: Every Day    Packs/day: 1.00    Years: 17.00    Pack years: 17.00    Types: Cigarettes   Smokeless tobacco: Never  Vaping Use   Vaping Use: Never used  Substance and Sexual Activity   Alcohol use: Yes    Comment: occasional   Drug use: Yes   Sexual activity: Yes  Other Topics Concern   Not on file  Social History Narrative   Not on file   Social Determinants of Health   Financial Resource Strain: Not on file  Food Insecurity: Not on file  Transportation Needs: Not on file  Physical Activity: Not on file  Stress: Not on file  Social Connections: Not on file  Additional Social History:                         Sleep: Fair  Appetite:  Good  Current Medications: Current Facility-Administered Medications  Medication Dose Route Frequency Provider Last Rate Last Admin   acetaminophen (TYLENOL) tablet 650 mg  650 mg Oral Q6H PRN Ophelia Shoulder E, NP   650 mg at 05/03/21 0341   albuterol (VENTOLIN HFA) 108 (90 Base) MCG/ACT inhaler 2 puff  2 puff Inhalation Q4H PRN Elpidio Anis, PA-C   2 puff at 05/04/21 0819   alum & mag hydroxide-simeth (MAALOX/MYLANTA) 200-200-20 MG/5ML suspension 30 mL  30 mL Oral Q4H PRN Chales Abrahams, NP       atorvastatin (LIPITOR) tablet  40 mg  40 mg Oral QHS Ophelia Shoulder E, NP   40 mg at 05/06/21 2033   guaiFENesin (MUCINEX) 12 hr tablet 1,200 mg  1,200 mg Oral BID PRN Jaclyn Shaggy, PA-C   1,200 mg at 05/05/21 0155   hydrOXYzine (ATARAX/VISTARIL) tablet 25 mg  25 mg Oral TID PRN Chales Abrahams, NP   25 mg at 05/05/21 2210   ibuprofen (ADVIL) tablet 400 mg  400 mg Oral BID PRN Armandina Stammer I, NP   400 mg at 05/05/21 0155   insulin aspart (novoLOG) injection 0-15 Units  0-15 Units Subcutaneous TID WC Ophelia Shoulder E, NP   2 Units at 05/07/21 0549   insulin aspart (novoLOG) injection 0-5 Units  0-5 Units Subcutaneous QHS Ophelia Shoulder E, NP   2 Units at 05/05/21 2211   insulin glargine (LANTUS) injection 30 Units  30 Units Subcutaneous QHS Comer Locket, MD   30 Units at 05/06/21 2033   loratadine (CLARITIN) tablet 10 mg  10 mg Oral Daily Ophelia Shoulder E, NP   10 mg at 05/07/21 0900   multivitamin with minerals tablet 1 tablet  1 tablet Oral Daily Laveda Abbe, NP   1 tablet at 05/07/21 0900   nicotine (NICODERM CQ - dosed in mg/24 hours) patch 21 mg  21 mg Transdermal Once Ophelia Shoulder E, NP       nicotine (NICODERM CQ - dosed in mg/24 hours) patch 21 mg  21 mg Transdermal Daily Singleton, Amy E, MD   21 mg at 05/07/21 0900   sertraline (ZOLOFT) tablet 50 mg  50 mg Oral Daily Claudie Revering, MD   50 mg at 05/07/21 0900   thiamine tablet 100 mg  100 mg Oral Daily Laveda Abbe, NP   100 mg at 05/07/21 0900   traZODone (DESYREL) tablet 100 mg  100 mg Oral QHS Claudie Revering, MD   100 mg at 05/05/21 2208   traZODone (DESYREL) tablet 50 mg  50 mg Oral QHS PRN Claudie Revering, MD   50 mg at 05/05/21 0155    Lab Results:  Results for orders placed or performed during the hospital encounter of 04/30/21 (from the past 48 hour(s))  Glucose, capillary     Status: Abnormal   Collection Time: 05/05/21 12:08 PM  Result Value Ref Range   Glucose-Capillary 180 (H) 70 - 99 mg/dL    Comment: Glucose reference range  applies only to samples taken after fasting for at least 8 hours.  Glucose, capillary     Status: Abnormal   Collection Time: 05/05/21  4:57 PM  Result Value Ref Range   Glucose-Capillary 158 (H) 70 - 99 mg/dL    Comment:  Glucose reference range applies only to samples taken after fasting for at least 8 hours.  Glucose, capillary     Status: Abnormal   Collection Time: 05/05/21  8:48 PM  Result Value Ref Range   Glucose-Capillary 213 (H) 70 - 99 mg/dL    Comment: Glucose reference range applies only to samples taken after fasting for at least 8 hours.   Comment 1 Notify RN    Comment 2 Document in Chart   Glucose, capillary     Status: Abnormal   Collection Time: 05/06/21  6:00 AM  Result Value Ref Range   Glucose-Capillary 155 (H) 70 - 99 mg/dL    Comment: Glucose reference range applies only to samples taken after fasting for at least 8 hours.   Comment 1 Notify RN    Comment 2 Document in Chart   Glucose, capillary     Status: Abnormal   Collection Time: 05/06/21 11:54 AM  Result Value Ref Range   Glucose-Capillary 149 (H) 70 - 99 mg/dL    Comment: Glucose reference range applies only to samples taken after fasting for at least 8 hours.  Glucose, capillary     Status: Abnormal   Collection Time: 05/06/21  5:20 PM  Result Value Ref Range   Glucose-Capillary 130 (H) 70 - 99 mg/dL    Comment: Glucose reference range applies only to samples taken after fasting for at least 8 hours.  Glucose, capillary     Status: Abnormal   Collection Time: 05/06/21  7:50 PM  Result Value Ref Range   Glucose-Capillary 150 (H) 70 - 99 mg/dL    Comment: Glucose reference range applies only to samples taken after fasting for at least 8 hours.   Comment 1 Notify RN    Comment 2 Document in Chart   Glucose, capillary     Status: Abnormal   Collection Time: 05/07/21  5:44 AM  Result Value Ref Range   Glucose-Capillary 124 (H) 70 - 99 mg/dL    Comment: Glucose reference range applies only to samples  taken after fasting for at least 8 hours.   Comment 1 Notify RN    Comment 2 Document in Chart     Blood Alcohol level:  Lab Results  Component Value Date   ETH 45 (H) 04/30/2021    Metabolic Disorder Labs: Lab Results  Component Value Date   HGBA1C 6.5 (H) 04/30/2021   MPG 139.85 04/30/2021   MPG 301 01/07/2021   No results found for: PROLACTIN Lab Results  Component Value Date   CHOL 118 05/02/2021   TRIG 144 05/02/2021   HDL 36 (L) 05/02/2021   CHOLHDL 3.3 05/02/2021   VLDL 29 05/02/2021   LDLCALC 53 05/02/2021   LDLCALC 155 (H) 01/11/2021    Physical Findings: AIMS:  , ,  ,  ,    CIWA:  CIWA-Ar Total: 1 COWS:     Musculoskeletal: Strength & Muscle Tone: within normal limits Gait & Station: normal Patient leans: N/A  Psychiatric Specialty Exam:  Presentation  General Appearance: Fairly Groomed  Eye Contact:Good  Speech:Normal Rate  Speech Volume:Normal  Handedness: Right  Mood and Affect  Mood:Euthymic  Affect:Congruent   Thought Process  Thought Processes:Coherent  Descriptions of Associations:Intact  Orientation:Full (Time, Place and Person)  Thought Content:Logical  History of Schizophrenia/Schizoaffective disorder:No  Duration of Psychotic Symptoms:No data recorded Hallucinations:Hallucinations: None Ideas of Reference:None  Suicidal Thoughts:Suicidal Thoughts: No Homicidal Thoughts:Homicidal Thoughts: No  Sensorium  Memory:Immediate Good; Recent Good; Remote Good  Judgment:Good  Insight:Good   Executive Functions  Concentration:Good  Attention Span:Good  Recall:Good  Fund of Knowledge:Good  Language:Good   Psychomotor Activity  Psychomotor Activity: Psychomotor Activity: Normal  Assets  Assets:Desire for Improvement; Resilience; Housing   Sleep  Sleep: Sleep: Fair Number of Hours of Sleep: 4.5   Physical Exam: Physical Exam Vitals and nursing note reviewed.  Constitutional:      Appearance:  Normal appearance.  HENT:     Head: Normocephalic and atraumatic.  Pulmonary:     Effort: Pulmonary effort is normal.  Neurological:     General: No focal deficit present.     Mental Status: He is alert and oriented to person, place, and time.   Review of Systems  All other systems reviewed and are negative. Blood pressure 127/87, pulse 78, temperature 97.8 F (36.6 C), temperature source Oral, resp. rate 16, height 5\' 2"  (1.575 m), weight 97.5 kg, SpO2 100 %. Body mass index is 39.32 kg/m.   Treatment Plan Summary: Daily contact with patient to assess and evaluate symptoms and progress in treatment, Medication management, and Plan patient is seen and examined.  Patient is a 34 year old male with the above-stated past psychiatric history who is seen in follow-up.  Diagnosis: 1.  Major depression, recurrent, severe without psychotic features 2.  Generalized anxiety disorder 3.  Alcohol use disorder 4.  Marijuana use disorder 5.  COVID-19 6.  Diabetes mellitus type 2 7.  Mildly elevated blood pressure  Pertinent findings on examination today: 1.  Patient denies suicidal or homicidal ideation. 2.  Patient stated anxiety is well controlled and no depressive symptoms. 3.  He denied any suicidal or homicidal ideation. 4.  No evidence of psychosis. 5.  COVID-19 is completely asymptomatic and afebrile at this point but remains positive. 6.  Diabetic control appears to be good.  Plan: 1.  COVID test remains positive today. 2.  Continue Zoloft 50 mg p.o. daily for depression and anxiety. 3.  Continue albuterol HFA as needed for shortness of breath or wheezing. 4.  Continue Lipitor 40 mg p.o. nightly for hyperlipidemia. 5.  Continue Mucinex 1200 mg p.o. twice daily as needed cough, wheezing or congestion. 6.  Continue Vistaril 25 mg p.o. 3 times daily as needed anxiety. 7.  Continue ibuprofen 400 mg p.o. twice daily as needed pain, headache or fever. 8.  Continue sliding scale  insulin. 9.  Continue Lantus insulin 30 units subcu nightly for diabetes mellitus type 2 10.  Continue Claritin 10 mg p.o. daily for seasonal allergies. 11.  Change trazodone to 150 mg insomnia. 12.  Disposition planning-in progress.  31, MD 05/07/2021, 11:34 AM

## 2021-05-07 NOTE — Progress Notes (Signed)
   05/07/21 2313  Psych Admission Type (Psych Patients Only)  Admission Status Voluntary  Psychosocial Assessment  Patient Complaints None  Eye Contact Fair  Facial Expression Other (Comment) (appropriate)  Affect Appropriate to circumstance  Speech Logical/coherent  Interaction Assertive  Motor Activity Other (Comment)  Appearance/Hygiene Unremarkable  Behavior Characteristics Appropriate to situation  Mood Pleasant  Thought Process  Coherency WDL  Content WDL  Delusions None reported or observed  Perception WDL  Hallucination None reported or observed  Judgment WDL  Confusion None  Danger to Self  Current suicidal ideation? Denies  Danger to Others  Danger to Others None reported or observed

## 2021-05-07 NOTE — Progress Notes (Signed)
Covid rapid test results performed   POSITIVE

## 2021-05-07 NOTE — Progress Notes (Addendum)
   05/07/21 1652  Psych Admission Type (Psych Patients Only)  Admission Status Involuntary  Psychosocial Assessment  Patient Complaints Anxiety  Eye Contact Fair  Facial Expression Other (Comment) (WDL)  Affect Appropriate to circumstance  Speech Logical/coherent  Interaction Assertive  Motor Activity Other (Comment) (WDL)  Appearance/Hygiene Unremarkable  Behavior Characteristics Cooperative;Appropriate to situation;Calm  Mood Pleasant  Thought Process  Coherency WDL  Content WDL  Delusions None reported or observed  Perception WDL  Hallucination None reported or observed  Judgment WDL  Confusion None  Danger to Self  Current suicidal ideation? Denies  Danger to Others  Danger to Others None reported or observed    D. Pt has been calm and compliant, pleasant and friendly during interactions. To be expected, pt was disappointed that he was unable to go home today, but took it in stride and didn't complain. . Per pt's self inventory, pt rated his depression, hopelessness and anxiety a 0/0/2, respectively. Pt denies SI/HI and A/VH. A. Labs and vitals monitored. Pt supported emotionally and encouraged to express concerns and ask questions.   R. Pt remains safe with 15 minute checks. Will continue POC.

## 2021-05-07 NOTE — [facility] Group Notes (Signed)
[facility] Group Notes: (Clinical Social Work)   05/07/2021      Type of Therapy:  Group Therapy   Participation Level:  Did Not Attend - not able due to isolation   Ambrose Mantle, LCSW 05/07/2021, 1:08 PM

## 2021-05-08 LAB — GLUCOSE, CAPILLARY
Glucose-Capillary: 104 mg/dL — ABNORMAL HIGH (ref 70–99)
Glucose-Capillary: 104 mg/dL — ABNORMAL HIGH (ref 70–99)
Glucose-Capillary: 117 mg/dL — ABNORMAL HIGH (ref 70–99)
Glucose-Capillary: 115 mg/dL — ABNORMAL HIGH (ref 70–99)

## 2021-05-08 LAB — RESP PANEL BY RT-PCR (FLU A&B, COVID) ARPGX2
Influenza A by PCR: NEGATIVE
Influenza B by PCR: NEGATIVE
SARS Coronavirus 2 by RT PCR: POSITIVE — AB

## 2021-05-08 MED ORDER — SERTRALINE HCL 100 MG PO TABS
100.0000 mg | ORAL_TABLET | Freq: Every day | ORAL | Status: DC
  Administered 2021-05-09: 100 mg via ORAL
  Filled 2021-05-08 (×3): qty 1

## 2021-05-08 MED ORDER — SERTRALINE HCL 50 MG PO TABS
50.0000 mg | ORAL_TABLET | Freq: Once | ORAL | Status: AC
  Administered 2021-05-08: 50 mg via ORAL
  Filled 2021-05-08 (×2): qty 1

## 2021-05-08 NOTE — Progress Notes (Signed)
   05/08/21 2226  Psych Admission Type (Psych Patients Only)  Admission Status Voluntary  Psychosocial Assessment  Patient Complaints None  Eye Contact Fair  Facial Expression Animated  Affect Appropriate to circumstance  Speech Logical/coherent  Interaction Assertive  Motor Activity Other (Comment) (WDL)  Appearance/Hygiene Unremarkable  Behavior Characteristics Cooperative;Appropriate to situation  Mood Pleasant  Thought Process  Coherency WDL  Content WDL  Delusions None reported or observed  Perception WDL  Hallucination None reported or observed  Judgment WDL  Confusion None  Danger to Self  Current suicidal ideation? Denies  Danger to Others  Danger to Others None reported or observed

## 2021-05-08 NOTE — [facility] Group Notes (Signed)
Pt did not attend group due to COVID.  Fredirick Lathe, LCSWA Clinicial Social Worker [employer]

## 2021-05-08 NOTE — Progress Notes (Signed)
D:  Patient stated he has felt better the past two days. Patient denied SI and HI, contracts for safety.  Denied A/V hallucinations.   A:  Medications administered per MD orders.   Patient stated he did not need any prn medications this morning.  Declined  pain and anxiety medications.   Emotional support and encouragement given patient. R:  Safety maintained with 15 minute checks.  Patient continues to stay in his room per covid precautions. Marland Kitchen

## 2021-05-08 NOTE — Progress Notes (Signed)
Spiritual care group on grief and loss facilitated by chaplain Lakeyshia Tuckerman MDiv, [facility]  Group Goal:  Support / Education around grief and loss Members engage in facilitated group support and psycho-social education.  Group Description:  Following introductions and group rules, group members engaged in facilitated group dialog and support around topic of loss, with particular support around experiences of loss in their lives. Group Identified types of loss (relationships / self / things) and identified patterns, circumstances, and changes that precipitate losses. Reflected on thoughts / feelings around loss, normalized grief responses, and recognized variety in grief experience.   Group noted Worden's four tasks of grief in discussion.  Group drew on Adlerian / Rogerian, narrative, MI, Patient Progress: Invited to group.  Did not attend.   

## 2021-05-08 NOTE — Progress Notes (Signed)
[facility] called and informed nurse that this patient continues to be covid positive at this time.

## 2021-05-08 NOTE — Plan of Care (Signed)
Nurse discussed anxiety, depression and coping skills with patient.  

## 2021-05-08 NOTE — Progress Notes (Signed)
Psychoeducational Group Note  Date:  05/08/2021 Time:  2115  Group Topic/Focus:  Wrap-Up Group:   The focus of this group is to help patients review their daily goal of treatment and discuss progress on daily workbooks.  Participation Level: Did Not Attend  Participation Quality:  Not Applicable  Affect:  Not Applicable  Cognitive:  Not Applicable  Insight:  Not Applicable  Engagement in Group: Not Applicable  Additional Comments:  The patient did not attend group since he is quarantined to his room.   Hazle Coca S 05/08/2021, 9:15 PM

## 2021-05-08 NOTE — Progress Notes (Signed)
[facility] MD Progress Note  05/08/2021 3:10 PM George Howe  MRN:  [id number]  Reason for admission:  George Howe is a 35 year old male with a history of diabetes (recently diagnosed in April 2022), history of pancreatitis, hyperlipidemia, childhood trauma, GAD and MDD brought in by a friend to [facility] after a near suicide attempt in the context of stressors of recent diagnosis of DM, recent failed business venture, financial stressors and alcohol use on the evening of near attempt.  Patient reported that he was contemplating cutting his throat with a large knife and had the knife in his hand but did not cut himself because of his dog and instead called a friend for help who brought him to the ED.  Objective: Medical record reviewed.  Patient's case discussed in detail with members of the treatment team.  I met with and evaluated the patient on the unit for follow-up today.  Patient appears significantly improved today.  He subjectively reports feeling much better.  Patient states that he has not had any physical symptoms from COVID for several days.  He feels his mood has improved and his anxiety has significantly diminished with the resolution of COVID symptoms.  He denies depressed mood, anhedonia or significant anxiety.  He denies passive wish for death, SI, AI, HI, AH or VH.  Patient states that he is eating and sleeping well.  He denies any physical issues.  Patient states belief that trazodone causes him to feel "wired" and worsens his sleep.  He has not taken trazodone for the past 2 nights and has not taken any PRN hydroxyzine for more than 48 hours.  I discussed with patient increasing his Zoloft to 100 mg/day to better treat depression and anxiety that were present prior to admission.  We discussed the possibility that the increase in dose may temporarily cause side effect of headache or nausea.  Patient stated that he would like to increase Zoloft to 100 mg daily.  Patient had a repeat COVID test  yesterday which was positive.  Repeat COVID test performed again this morning was also positive.  Patient will need to remain in hospital at least until tomorrow since it has not yet been 7 days since his first positive COVID test and patient lives with roommates.  The patient slept 6.75 hours last night.  Patient remains afebrile with stable vital signs within normal limits.  Repeat COVID test was positive this morning.  Capillary blood glucose readings have ranged from a low of 97 to a high of 143 over the past 24 hours.  Patient is taking scheduled medications as prescribed except for his scheduled trazodone dose which he has declined for the past 2 nights.  He has not required any PRN medications in more than 48 hours.    Principal Problem: MDD (major depressive disorder), recurrent severe, without psychosis ([place]) Diagnosis: Principal Problem:   MDD (major depressive disorder), recurrent severe, without psychosis ([place]) Active Problems:   GAD (generalized anxiety disorder)  Total Time spent with patient:  20 minutes  Past Psychiatric History: See admission H&P  Past Medical History:  Past Medical History:  Diagnosis Date   Chicken pox    Depression    Diabetes mellitus without complication ([place])    DM (diabetes mellitus) ([place])    Hay fever    Hyperlipidemia    Hyperlipidemia    Morbid obesity ([place])    History reviewed. No pertinent surgical history. Family History:  Family History  Problem Relation Age of Onset  Diabetes Father    Diabetes Paternal Grandmother    Family Psychiatric  History: See admission H&P Social History:  Social History   Substance and Sexual Activity  Alcohol Use Yes   Comment: occasional     Social History   Substance and Sexual Activity  Drug Use Yes    Social History   Socioeconomic History   Marital status: Single    Spouse name: Not on file   Number of children: Not on file   Years of education: Not on file   Highest education level:  Not on file  Occupational History   Not on file  Tobacco Use   Smoking status: Every Day    Packs/day: 1.00    Years: 17.00    Pack years: 17.00    Types: Cigarettes   Smokeless tobacco: Never  Vaping Use   Vaping Use: Never used  Substance and Sexual Activity   Alcohol use: Yes    Comment: occasional   Drug use: Yes   Sexual activity: Yes  Other Topics Concern   Not on file  Social History Narrative   Not on file   Social Determinants of Health   Financial Resource Strain: Not on file  Food Insecurity: Not on file  Transportation Needs: Not on file  Physical Activity: Not on file  Stress: Not on file  Social Connections: Not on file   Additional Social History:                         Sleep: Good  Appetite:  Good  Current Medications: Current Facility-Administered Medications  Medication Dose Route Frequency Provider Last Rate Last Admin   acetaminophen (TYLENOL) tablet 650 mg  650 mg Oral Q6H PRN Merlyn Lot E, NP   650 mg at 05/03/21 0341   albuterol (VENTOLIN HFA) 108 (90 Base) MCG/ACT inhaler 2 puff  2 puff Inhalation Q4H PRN Charlann Lange, PA-C   2 puff at 05/04/21 0819   alum & mag hydroxide-simeth (MAALOX/MYLANTA) 200-200-20 MG/5ML suspension 30 mL  30 mL Oral Q4H PRN Merlyn Lot E, NP       atorvastatin (LIPITOR) tablet 40 mg  40 mg Oral QHS Merlyn Lot E, NP   40 mg at 05/07/21 2017   guaiFENesin (MUCINEX) 12 hr tablet 1,200 mg  1,200 mg Oral BID PRN Prescilla Sours, PA-C   1,200 mg at 05/05/21 0155   hydrOXYzine (ATARAX/VISTARIL) tablet 25 mg  25 mg Oral TID PRN Merlyn Lot E, NP   25 mg at 05/05/21 2210   ibuprofen (ADVIL) tablet 400 mg  400 mg Oral BID PRN Lindell Spar I, NP   400 mg at 05/05/21 0155   insulin aspart (novoLOG) injection 0-15 Units  0-15 Units Subcutaneous TID WC Merlyn Lot E, NP   2 Units at 05/07/21 0549   insulin aspart (novoLOG) injection 0-5 Units  0-5 Units Subcutaneous QHS Merlyn Lot E, NP   2 Units at  05/05/21 2211   insulin glargine (LANTUS) injection 30 Units  30 Units Subcutaneous QHS Harlow Asa, MD   30 Units at 05/07/21 2016   loratadine (CLARITIN) tablet 10 mg  10 mg Oral Daily Merlyn Lot E, NP   10 mg at 05/08/21 4259   multivitamin with minerals tablet 1 tablet  1 tablet Oral Daily Ethelene Hal, NP   1 tablet at 05/08/21 5638   nicotine (NICODERM CQ - dosed in mg/24 hours) patch 21 mg  21  mg Transdermal Once Merlyn Lot E, NP       nicotine (NICODERM CQ - dosed in mg/24 hours) patch 21 mg  21 mg Transdermal Daily Nelda Marseille, Amy E, MD   21 mg at 05/08/21 0812   [START ON 05/09/2021] sertraline (ZOLOFT) tablet 100 mg  100 mg Oral Daily Arthor Captain, MD       thiamine tablet 100 mg  100 mg Oral Daily Ethelene Hal, NP   100 mg at 05/08/21 4098    Lab Results:  Results for orders placed or performed during the hospital encounter of 04/30/21 (from the past 48 hour(s))  Glucose, capillary     Status: Abnormal   Collection Time: 05/06/21  5:20 PM  Result Value Ref Range   Glucose-Capillary 130 (H) 70 - 99 mg/dL    Comment: Glucose reference range applies only to samples taken after fasting for at least 8 hours.  Glucose, capillary     Status: Abnormal   Collection Time: 05/06/21  7:50 PM  Result Value Ref Range   Glucose-Capillary 150 (H) 70 - 99 mg/dL    Comment: Glucose reference range applies only to samples taken after fasting for at least 8 hours.   Comment 1 Notify RN    Comment 2 Document in Chart   Glucose, capillary     Status: Abnormal   Collection Time: 05/07/21  5:44 AM  Result Value Ref Range   Glucose-Capillary 124 (H) 70 - 99 mg/dL    Comment: Glucose reference range applies only to samples taken after fasting for at least 8 hours.   Comment 1 Notify RN    Comment 2 Document in Chart   Glucose, capillary     Status: Abnormal   Collection Time: 05/07/21 11:54 AM  Result Value Ref Range   Glucose-Capillary 130 (H) 70 - 99 mg/dL     Comment: Glucose reference range applies only to samples taken after fasting for at least 8 hours.  Glucose, capillary     Status: None   Collection Time: 05/07/21  4:54 PM  Result Value Ref Range   Glucose-Capillary 97 70 - 99 mg/dL    Comment: Glucose reference range applies only to samples taken after fasting for at least 8 hours.  Glucose, capillary     Status: Abnormal   Collection Time: 05/07/21  7:41 PM  Result Value Ref Range   Glucose-Capillary 143 (H) 70 - 99 mg/dL    Comment: Glucose reference range applies only to samples taken after fasting for at least 8 hours.  Glucose, capillary     Status: Abnormal   Collection Time: 05/08/21  5:39 AM  Result Value Ref Range   Glucose-Capillary 104 (H) 70 - 99 mg/dL    Comment: Glucose reference range applies only to samples taken after fasting for at least 8 hours.  Resp Panel by RT-PCR (Flu A&B, Covid) Nasopharyngeal Swab     Status: Abnormal   Collection Time: 05/08/21 10:03 AM   Specimen: Nasopharyngeal Swab; Nasopharyngeal(NP) swabs in vial transport medium  Result Value Ref Range   SARS Coronavirus 2 by RT PCR POSITIVE (A) NEGATIVE    Comment: RESULT CALLED TO, READ BACK BY AND VERIFIED WITH: Grayland Ormond, RN 05/08/21 1158 KDS (NOTE) SARS-CoV-2 target nucleic acids are DETECTED.  The SARS-CoV-2 RNA is generally detectable in upper respiratory specimens during the acute phase of infection. Positive results are indicative of the presence of the identified virus, but do not rule out bacterial infection or co-infection  with other pathogens not detected by the test. Clinical correlation with patient history and other diagnostic information is necessary to determine patient infection status. The expected result is Negative.  Fact Sheet for Patients: EntrepreneurPulse.com.au  Fact Sheet for Healthcare Providers: IncredibleEmployment.be  This test is not yet approved or cleared by the [place] FDA and  has been authorized for detection and/or diagnosis of SARS-CoV-2 by FDA under an Emergency Use Authorization (EUA).  This EUA will remain in effect (meaning this test can  be used) for the duration of  the COVID-19 declaration under Section 564(b)(1) of the Act, 21 U.S.C. section 360bbb-3(b)(1), unless the authorization is terminated or revoked sooner.     Influenza A by PCR NEGATIVE NEGATIVE   Influenza B by PCR NEGATIVE NEGATIVE    Comment: (NOTE) The Xpert Xpress SARS-CoV-2/FLU/RSV plus assay is intended as an aid in the diagnosis of influenza from Nasopharyngeal swab specimens and should not be used as a sole basis for treatment. Nasal washings and aspirates are unacceptable for Xpert Xpress SARS-CoV-2/FLU/RSV testing.  Fact Sheet for Patients: EntrepreneurPulse.com.au  Fact Sheet for Healthcare Providers: IncredibleEmployment.be  This test is not yet approved or cleared by the [place] FDA and has been authorized for detection and/or diagnosis of SARS-CoV-2 by FDA under an Emergency Use Authorization (EUA). This EUA will remain in effect (meaning this test can be used) for the duration of the COVID-19 declaration under Section 564(b)(1) of the Act, 21 U.S.C. section 360bbb-3(b)(1), unless the authorization is terminated or revoked.  Performed at [facility], [place] [address] [place], [place] [address]   Glucose, capillary     Status: Abnormal   Collection Time: 05/08/21 11:37 AM  Result Value Ref Range   Glucose-Capillary 104 (H) 70 - 99 mg/dL    Comment: Glucose reference range applies only to samples taken after fasting for at least 8 hours.    Blood Alcohol level:  Lab Results  Component Value Date   ETH 45 (H) 96/28/3662    Metabolic Disorder Labs: Lab Results  Component Value Date   HGBA1C 6.5 (H) 04/30/2021   MPG 139.85 04/30/2021   MPG 301 01/07/2021   No results found for:  PROLACTIN Lab Results  Component Value Date   CHOL 118 05/02/2021   TRIG 144 05/02/2021   HDL 36 (L) 05/02/2021   CHOLHDL 3.3 05/02/2021   VLDL 29 05/02/2021   LDLCALC 53 05/02/2021   LDLCALC 155 (H) 01/11/2021    Physical Findings: AIMS:  , ,  ,  ,    CIWA:  CIWA-Ar Total: 1 COWS:     Musculoskeletal: Strength & Muscle Tone: within normal limits Gait & Station: normal Patient leans: N/A  Psychiatric Specialty Exam:  Presentation  General Appearance: Fairly Groomed  Eye Contact:Good  Speech:Clear and Coherent; Normal Rate  Speech Volume:Normal  Handedness: Right  Mood and Affect  Mood:Euthymic  Affect:Congruent   Thought Process  Thought Processes:Coherent; Goal Directed  Descriptions of Associations:Intact  Orientation:Full (Time, Place and Person)  Thought Content:Logical; WDL  History of Schizophrenia/Schizoaffective disorder:No  Duration of Psychotic Symptoms:No data recorded Hallucinations:Hallucinations: None  Ideas of Reference:None  Suicidal Thoughts:Suicidal Thoughts: No  Homicidal Thoughts:Homicidal Thoughts: No   Sensorium  Memory:Immediate Good; Recent Good  Judgment:Good  Insight:Good   Executive Functions  Concentration:Good  Attention Span:Good  [place] of Knowledge:Good  Language:Good   Psychomotor Activity  Psychomotor Activity:Psychomotor Activity: Normal   Assets  Assets:Communication Skills; Desire for Improvement; Resilience; Social Support;  Housing   Sleep  Sleep:Sleep: Good Number of Hours of Sleep: 6.75    Physical Exam: Physical Exam Vitals and nursing note reviewed.  Constitutional:      General: He is not in acute distress.    Appearance: He is not diaphoretic.  HENT:     Head: Normocephalic and atraumatic.  Cardiovascular:     Rate and Rhythm: Normal rate.  Pulmonary:     Effort: Pulmonary effort is normal.  Neurological:     General: No focal deficit present.      Mental Status: He is alert and oriented to person, place, and time.   Review of Systems  Constitutional:  Negative for chills, diaphoresis, fever and malaise/fatigue.  HENT:  Negative for congestion and sore throat.   Respiratory:  Negative for cough, shortness of breath and wheezing.   Cardiovascular:  Negative for chest pain and palpitations.  Gastrointestinal:  Negative for constipation, diarrhea, nausea and vomiting.  Genitourinary: Negative.   Musculoskeletal:  Negative for myalgias.       Positive for chronic bilateral pain in hands   Skin:  Negative for rash.  Neurological:  Negative for dizziness, tremors and headaches.  Psychiatric/Behavioral:  Negative for depression, hallucinations and suicidal ideas. The patient is not nervous/anxious and does not have insomnia.   All other systems reviewed and are negative. Blood pressure 132/80, pulse 82, temperature 98.3 F (36.8 C), temperature source Oral, resp. rate 18, height 5' 2"  (1.575 m), weight 97.5 kg, SpO2 99 %. Body mass index is 39.32 kg/m.   Treatment Plan Summary: Patient is a 35 year old male with MDD and GAD admitted with worsening symptoms of depression and following near suicide attempt.  He tested positive for COVID-19 on the morning of 05/02/2021 which was his second day of hospitalization.  Patient's intermittent symptoms consistent with COVID positive status have resolved and he has been symptom free for approximately 48 hours.  Repeat COVID test was positive yesterday and second repeat test today was also positive.  Patient reports improved mood and anxiety since physical symptoms improved.  Daily contact with patient to assess and evaluate symptoms and progress in treatment and Medication management  Continue IVC status.    COVID+ -Positive SARS Coronavirus 2 by RT PCR test performed on 05/02/2021.  Repeat tests on 05/07/2021 and 05/08/2021 (today) were also positive. -Patient continues to quarantine in his room  with airborne and contact precautions.  He has remained afebrile since morning of 05/02/2021 (the morning of ischial positive COVID test).   -Anticipate probable discharge on 05/09/2021 which will be 7 days since patient's first positive COVID test -Continue albuterol inhaler 2 puffs every 4 hours PRN wheezing, shortness of breath, chest tightness -Continue Mucinex 1200 mg twice daily PRN congestion -Continue acetaminophen 650 mg every 6 hours PRN pain -Continue ibuprofen 400 mg twice daily PRN moderate pain, headaches -Elevate head of bed at night  Diabetes -Continue Lantus insulin 30 units at bedtime -Continue NovoLog sliding scale 3 times daily with meals and at bedtime -Continue CBG monitoring 3 times daily before meals and at bedtime  MDD/GAD -Increase Zoloft to 100 mg daily for depression and anxiety. -Continue hydroxyzine 25 mg 3 times daily PRN anxiety  Insomnia -Discontinue trazodone 100 mg at bedtime as patient feels trazodone makes his sleep worse -Discontinue trazodone 50 mg nightly PRN as patient feels trazodone makes his sleep worse -Elevate the head of bed at night  -Patient has possible comorbid undiagnosed sleep apnea.  I have discussed with patient  that he should seek referral for sleep study from his PCP after discharge to assess for sleep apnea.  Seasonal allergies -Continue Claritin 10 mg daily  Hyperlipidemia -Continue Lipitor 40 mg daily  CIWA protocol has been discontinued.  Discharge planning in progress.  Patient will need referral to outpatient psychiatrist and will need information about substance abuse treatment programs.  Arthor Captain, MD 05/08/2021, 3:10 PM

## 2021-05-08 NOTE — Progress Notes (Signed)
Psychoeducational Group Note  Date:  05/08/2021 Time: 0938  Group Topic/Focus:  Developing a Wellness Toolbox:   The focus of this group is to help patients develop a "wellness toolbox" with skills and strategies to promote recovery upon discharge.  Participation Level: Did Not Attend  Participation Quality:  Not Applicable  Affect:  Not Applicable  Cognitive:  Not Applicable  Insight:  Not Applicable  Engagement in Group: Not Applicable  Additional Comments:  Pt did not attend group due to COVID.  Adreena Willits E 05/08/2021, 2:13 PM

## 2021-05-08 NOTE — [facility] Group Notes (Signed)
Occupational Therapy Group Note Date: 05/08/2021 Group Topic/Focus: Sleep Hygiene  Group Description: Group encouraged increased engagement and participation through group discussion focused on Sleep Hygiene. Patients were encouraged to share their own concerns and difficulties with getting a healthy night's rest. Patients were then provided education and resources on habits/routines to maintain healthy sleep hygiene.  Therapeutic Goal(s): Identify and share any current issues with sleep routine and overall sleep hygiene Identify strategies, both environmental and lifestyle, that could help to improve overall sleep quality Participation Level: Pt was excused from OT group d/t COVID isolation.    Plan: Continue to engage patient in OT groups 2 - 3x/week.  05/08/2021  Donne Hazel, MOT, OTR/L

## 2021-05-09 ENCOUNTER — Other Ambulatory Visit (HOSPITAL_COMMUNITY): Payer: Self-pay

## 2021-05-09 LAB — GLUCOSE, CAPILLARY
Glucose-Capillary: 101 mg/dL — ABNORMAL HIGH (ref 70–99)
Glucose-Capillary: 85 mg/dL (ref 70–99)

## 2021-05-09 MED ORDER — HYDROXYZINE HCL 25 MG PO TABS
25.0000 mg | ORAL_TABLET | Freq: Two times a day (BID) | ORAL | 0 refills | Status: DC | PRN
  Filled 2021-05-09: qty 30, 15d supply, fill #0

## 2021-05-09 MED ORDER — SERTRALINE HCL 100 MG PO TABS
100.0000 mg | ORAL_TABLET | Freq: Every day | ORAL | 0 refills | Status: DC
  Filled 2021-05-09 – 2021-06-03 (×2): qty 30, 30d supply, fill #0

## 2021-05-09 MED ORDER — ALBUTEROL SULFATE HFA 108 (90 BASE) MCG/ACT IN AERS
2.0000 | INHALATION_SPRAY | RESPIRATORY_TRACT | 0 refills | Status: DC | PRN
  Filled 2021-05-09: qty 8.5, 17d supply, fill #0

## 2021-05-09 MED ORDER — SERTRALINE HCL 100 MG PO TABS: 100.0000 mg | ORAL_TABLET | Freq: Every day | ORAL | 0 refills | Status: DC

## 2021-05-09 MED ORDER — ALBUTEROL SULFATE HFA 108 (90 BASE) MCG/ACT IN AERS: 2.0000 | INHALATION_SPRAY | RESPIRATORY_TRACT | 0 refills | Status: DC | PRN

## 2021-05-09 MED ORDER — HYDROXYZINE HCL 25 MG PO TABS: 25.0000 mg | ORAL_TABLET | Freq: Three times a day (TID) | ORAL | 0 refills | Status: DC | PRN

## 2021-05-09 NOTE — [facility] Group Notes (Signed)
Pt didn't attend group. 

## 2021-05-09 NOTE — Progress Notes (Signed)
RN met with pt and reviewed pt's discharge instructions.  Pt verbalized understanding of discharge instructions and pt did not have any questions. RN reviewed and provided pt with a copy of SRA, AVS and Transition Record.  RN returned pt's belongings to pt. Pt denied SI/HI/AVH and voiced no concerns.  Pt was appreciative of the care pt received at [facility]  Patient discharged to the lobby without incident where pt's friend was waiting to take pt home.

## 2021-05-09 NOTE — Progress Notes (Signed)
Recreation Therapy Notes  Animal-Assisted Activity (AAA) Program Checklist/Progress Notes Patient Eligibility Criteria Checklist & Daily Group note for Rec Tx Intervention  Date: 8.16.22 Time: 1430 Location: [address] Dayroom  AAA/T Program Assumption of Risk Form signed by Patient/ or Parent Legal Guardian YES   Patient is free of allergies or severe asthma  YES   Patient reports no fear of animals  YES   Patient reports no history of cruelty to animals YES   Patient understands his/her participation is voluntary  YES   Patient washes hands before animal contact  YES  Patient washes hands after animal contact  YES  Education: Hand Washing, Appropriate Animal Interaction   Education Outcome: Acknowledges understanding/In group clarification offered/Needs additional education.   Clinical Observations/Feedback: Pt did not attend group session.    Laetitia Schnepf, LRT/CTRS        Kalyssa Anker A 05/09/2021 3:59 PM 
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
## 2021-05-09 NOTE — BHH Suicide Risk Assessment (Signed)
[facility] Discharge Suicide Risk Assessment   Principal Problem: MDD (major depressive disorder), recurrent severe, without psychosis ([facility]) Discharge Diagnoses: Principal Problem:   MDD (major depressive disorder), recurrent severe, without psychosis ([facility]) Active Problems:   GAD (generalized anxiety disorder)   Total Time spent with patient: 20 minutes  Musculoskeletal: Strength & Muscle Tone: within normal limits Gait & Station: normal Patient leans: N/A  Psychiatric Specialty Exam  Presentation  General Appearance: Appropriate for Environment  Eye Contact:Good  Speech:Clear and Coherent; Normal Rate  Speech Volume:Normal  Handedness:Right   Mood and Affect  Mood:Euthymic  Duration of Depression Symptoms: Greater than two weeks  Affect:Congruent; Appropriate   Thought Process  Thought Processes:Coherent; Goal Directed  Descriptions of Associations:Intact  Orientation:Full (Time, Place and Person)  Thought Content:Logical; WDL  History of Schizophrenia/Schizoaffective disorder:No  Duration of Psychotic Symptoms:No data recorded Hallucinations:Hallucinations: None  Ideas of Reference:None  Suicidal Thoughts:Suicidal Thoughts: No  Homicidal Thoughts:Homicidal Thoughts: No   Sensorium  Memory:Immediate Good; Recent Good  Judgment:Good  Insight:Good   Executive Functions  Concentration:Good  Attention Span:Good  Recall:Good  Fund of Knowledge:Good  Language:Good   Psychomotor Activity  Psychomotor Activity:Psychomotor Activity: Normal   Assets  Assets:Communication Skills; Desire for Improvement; Leisure Time; Housing; Resilience; Social Support; Vocational/Educational   Sleep  Sleep:Sleep: Good Number of Hours of Sleep: 6.75   Physical Exam: Physical Exam Vitals and nursing note reviewed.  Constitutional:      General: He is not in acute distress.    Appearance: Normal appearance. He is not diaphoretic.  HENT:     Head:  Normocephalic and atraumatic.  Cardiovascular:     Rate and Rhythm: Normal rate.  Pulmonary:     Effort: Pulmonary effort is normal.  Neurological:     General: No focal deficit present.     Mental Status: He is alert and oriented to person, place, and time.   Review of Systems  Constitutional:  Negative for chills, diaphoresis, fever and malaise/fatigue.  HENT:  Negative for sore throat.   Respiratory:  Negative for cough and shortness of breath.   Cardiovascular:  Negative for chest pain and palpitations.  Gastrointestinal:  Negative for constipation, diarrhea, nausea and vomiting.  Musculoskeletal:  Negative for myalgias.  Skin:  Negative for rash.  Neurological:  Negative for dizziness, tremors, seizures and headaches.  Psychiatric/Behavioral:  Negative for depression, hallucinations and suicidal ideas. The patient is not nervous/anxious and does not have insomnia.   All other systems reviewed and are negative. Blood pressure 136/86, pulse 73, temperature 97.7 F (36.5 C), temperature source Oral, resp. rate 18, height 5\' 2"  (1.575 m), weight 97.5 kg, SpO2 100 %. Body mass index is 39.32 kg/m.  Mental Status Per Nursing Assessment::   On Admission:  Suicidal ideation indicated by patient  Demographic Factors:  Male and Caucasian  Loss Factors: Financial problems/change in socioeconomic status  Historical Factors: Family history of mental illness or substance abuse and Impulsivity  Risk Reduction Factors:   Employed, Living with another person, especially a relative, Positive social support, Positive therapeutic relationship, and Positive coping skills or problem solving skills  Continued Clinical Symptoms:  Anxiety - improved Depression - improved Previous Psychiatric Diagnoses and Treatments  Cognitive Features That Contribute To Risk:  None    Suicide Risk:  Minimal acute risk: No identifiable suicidal ideation.  Patients presenting with no risk factors but with  morbid ruminations; may be classified as minimal risk based on the severity of the depressive symptoms   Follow-up Information  [facility] Follow up on 05/10/2021.   Specialty: Behavioral Health Why: You have an appointment on 05/10/21 at 11:00 am for therapy services.  This will be a Virtual appointment. Contact information: [address] [place] [address] [phone]        [facility] Follow up on 06/05/2021.   Specialty: Behavioral Health Why: You have an appointment on  06/05/21 at 11:00 am with Dr. Gilmore Laroche for medication management.  This will be a Virtual appointment. Contact information: [address] [place] [address] [phone]        [facility] ([facility] Call.   Why: Call to set up an appointment for substance use support and treatment.  Walk in triage walk in hours, Monday Wednesday and Friday from 1:30pm-3:30pm Contact information: [address] [place], [place] [address] Office: [phone]                Plan Of Care/Follow-up recommendations:  Tests: You had a positive COVID test on 05/02/2021.  You should continue to wear a mask and quarantine yourself from roommates and others for an additional 3 days following discharge home.  Otherwise activity as tolerated  Other:   -Take medications as prescribed.   -Do not drink alcohol.  Do not use marijuana/cannabis or other drugs.   -Attend outpatient substance abuse treatment  -Keep outpatient mental health follow-up appointments with therapist and psychiatrist.   -See your primary care provider for treatment of medical conditions. -Ask your primary care provider to refer you for sleep study to assess for possible sleep apnea.  George Revering, MD 05/09/2021, 1:04 PM

## 2021-05-09 NOTE — Progress Notes (Signed)
  [facility] Adult Case Management Discharge Plan :  Will you be returning to the same living situation after discharge:  Yes,  back to living with roommate At discharge, do you have transportation home?: Yes,  Roommate will be picking patient up Do you have the ability to pay for your medications: Yes,  Insurance  Release of information consent forms completed and in the chart;  Patient's signature needed at discharge.  Patient to Follow up at:  Follow-up Information     [name] Follow up on 05/10/2021.   Specialty: Behavioral Health Why: You have an appointment on 05/10/21 at 11:00 am for therapy services.  This will be a Virtual appointment. Contact information: [address] [place] [address] [phone]        [facility] Follow up on 06/05/2021.   Specialty: Behavioral Health Why: You have an appointment on  06/05/21 at 11:00 am with Dr. Gilmore Laroche for medication management.  This will be a Virtual appointment. Contact information: [address] [place] [address] [phone]        [facility] ([facility] Call.   Why: Call to set up an appointment for substance use support and treatment.  Walk in triage walk in hours, Monday Wednesday and Friday from 1:30pm-3:30pm Contact information: [address] [place], [place] [address] Office: [phone]                Next level of care provider has access to [facility] Link:no  Safety Planning and Suicide Prevention discussed: Yes,  Friend, [name]     Has patient been referred to the Quitline?: Yes, faxed on 8/16  Patient has been referred for addiction treatment: Yes  Tomas Schamp E Malicia Blasdel, LCSW 05/09/2021, 9:56 AM

## 2021-05-10 ENCOUNTER — Ambulatory Visit (INDEPENDENT_AMBULATORY_CARE_PROVIDER_SITE_OTHER): Payer: 59 | Admitting: Psychologist

## 2021-05-10 ENCOUNTER — Other Ambulatory Visit (HOSPITAL_COMMUNITY): Payer: Self-pay

## 2021-05-10 DIAGNOSIS — F411 Generalized anxiety disorder: Secondary | ICD-10-CM

## 2021-05-10 DIAGNOSIS — F331 Major depressive disorder, recurrent, moderate: Secondary | ICD-10-CM | POA: Diagnosis not present

## 2021-05-12 ENCOUNTER — Ambulatory Visit: Payer: 59 | Admitting: Dietician

## 2021-05-16 ENCOUNTER — Ambulatory Visit (INDEPENDENT_AMBULATORY_CARE_PROVIDER_SITE_OTHER): Payer: 59 | Admitting: Psychologist

## 2021-05-16 DIAGNOSIS — F411 Generalized anxiety disorder: Secondary | ICD-10-CM | POA: Diagnosis not present

## 2021-05-16 DIAGNOSIS — F331 Major depressive disorder, recurrent, moderate: Secondary | ICD-10-CM

## 2021-05-18 ENCOUNTER — Other Ambulatory Visit (HOSPITAL_COMMUNITY): Payer: Self-pay

## 2021-05-26 ENCOUNTER — Ambulatory Visit (INDEPENDENT_AMBULATORY_CARE_PROVIDER_SITE_OTHER): Payer: 59 | Admitting: Psychologist

## 2021-05-26 DIAGNOSIS — F331 Major depressive disorder, recurrent, moderate: Secondary | ICD-10-CM | POA: Diagnosis not present

## 2021-05-26 DIAGNOSIS — F411 Generalized anxiety disorder: Secondary | ICD-10-CM | POA: Diagnosis not present

## 2021-06-03 ENCOUNTER — Other Ambulatory Visit: Payer: Self-pay

## 2021-06-03 ENCOUNTER — Other Ambulatory Visit (HOSPITAL_COMMUNITY): Payer: Self-pay

## 2021-06-05 ENCOUNTER — Other Ambulatory Visit (HOSPITAL_COMMUNITY): Payer: Self-pay

## 2021-06-05 ENCOUNTER — Telehealth (HOSPITAL_COMMUNITY): Payer: 59 | Admitting: Psychiatry

## 2021-06-07 ENCOUNTER — Other Ambulatory Visit (HOSPITAL_COMMUNITY): Payer: Self-pay

## 2021-06-07 ENCOUNTER — Other Ambulatory Visit: Payer: Self-pay | Admitting: Internal Medicine

## 2021-06-07 DIAGNOSIS — E781 Pure hyperglyceridemia: Secondary | ICD-10-CM

## 2021-06-07 MED ORDER — ATORVASTATIN CALCIUM 40 MG PO TABS
40.0000 mg | ORAL_TABLET | Freq: Every day | ORAL | 0 refills | Status: DC
  Filled 2021-06-07: qty 90, 90d supply, fill #0

## 2021-06-08 ENCOUNTER — Ambulatory Visit: Payer: 59 | Admitting: Internal Medicine

## 2021-06-14 ENCOUNTER — Ambulatory Visit: Payer: 59 | Admitting: Psychologist

## 2021-07-11 ENCOUNTER — Emergency Department (HOSPITAL_COMMUNITY)
Admission: EM | Admit: 2021-07-11 | Discharge: 2021-07-12 | Disposition: A | Discharge status: Home / Self Care | Payer: 59 | Attending: Emergency Medicine | Admitting: Emergency Medicine

## 2021-07-11 ENCOUNTER — Telehealth: Payer: Self-pay

## 2021-07-11 ENCOUNTER — Other Ambulatory Visit: Payer: Self-pay

## 2021-07-11 ENCOUNTER — Encounter (HOSPITAL_COMMUNITY): Payer: Self-pay | Admitting: Pharmacy Technician

## 2021-07-11 DIAGNOSIS — E119 Type 2 diabetes mellitus without complications: Secondary | ICD-10-CM | POA: Diagnosis not present

## 2021-07-11 DIAGNOSIS — R1012 Left upper quadrant pain: Secondary | ICD-10-CM | POA: Diagnosis not present

## 2021-07-11 DIAGNOSIS — R112 Nausea with vomiting, unspecified: Secondary | ICD-10-CM | POA: Diagnosis not present

## 2021-07-11 DIAGNOSIS — Z794 Long term (current) use of insulin: Secondary | ICD-10-CM | POA: Diagnosis not present

## 2021-07-11 DIAGNOSIS — F17210 Nicotine dependence, cigarettes, uncomplicated: Secondary | ICD-10-CM | POA: Diagnosis not present

## 2021-07-11 DIAGNOSIS — R1010 Upper abdominal pain, unspecified: Secondary | ICD-10-CM | POA: Diagnosis present

## 2021-07-11 DIAGNOSIS — K8500 Idiopathic acute pancreatitis without necrosis or infection: Secondary | ICD-10-CM

## 2021-07-11 LAB — URINALYSIS, ROUTINE W REFLEX MICROSCOPIC
Bilirubin Urine: NEGATIVE
Glucose, UA: 50 mg/dL — AB
Hgb urine dipstick: NEGATIVE
Ketones, ur: 5 mg/dL — AB
Leukocytes,Ua: NEGATIVE
Nitrite: NEGATIVE
Protein, ur: 30 mg/dL — AB
Specific Gravity, Urine: 1.031 — ABNORMAL HIGH (ref 1.005–1.030)
pH: 5 (ref 5.0–8.0)

## 2021-07-11 LAB — CBC
HCT: 47.5 % (ref 39.0–52.0)
Hemoglobin: 17.5 g/dL — ABNORMAL HIGH (ref 13.0–17.0)
MCH: 30.7 pg (ref 26.0–34.0)
MCHC: 36.8 g/dL — ABNORMAL HIGH (ref 30.0–36.0)
MCV: 83.3 fL (ref 80.0–100.0)
Platelets: 273 10*3/uL (ref 150–400)
RBC: 5.7 MIL/uL (ref 4.22–5.81)
RDW: 13.2 % (ref 11.5–15.5)
WBC: 17.4 10*3/uL — ABNORMAL HIGH (ref 4.0–10.5)
nRBC: 0 % (ref 0.0–0.2)

## 2021-07-11 LAB — CBG MONITORING, ED: Glucose-Capillary: 238 mg/dL — ABNORMAL HIGH (ref 70–99)

## 2021-07-11 NOTE — ED Provider Notes (Signed)
Emergency Medicine Provider Triage Evaluation Note  George Howe , a 35 y.o. male  was evaluated in triage.  Pt complains of abdominal pain, nausea vomiting since this morning.  Same thing happened in April when he was diagnosed with pancreatitis and diabetes.  Review of Systems  Positive: Nausea, vomiting and left-sided abdominal pain. Negative: Urinary symptoms  Physical Exam  BP (!) 153/108   Pulse 61   Temp 97.8 F (36.6 C) (Oral)   Resp 18   SpO2 99%  Gen:   Awake, no distress   Resp:  Normal effort  MSK:   Moves extremities without difficulty  Other:  Diffuse tenderness to palpation of abdomen  Medical Decision Making  Medically screening exam initiated at 6:06 PM.  Appropriate orders placed.  [name] was informed that the remainder of the evaluation will be completed by another provider, this initial triage assessment does not replace that evaluation, and the importance of remaining in the ED until their evaluation is complete.     [name] 07/11/21 1807    Gerhard Munch, MD 07/11/21 2155

## 2021-07-11 NOTE — ED Triage Notes (Signed)
Pt here with reports of LUQ abdominal pain onset today. Pt endorses feeling clammy and emesis X4. Hx pancreatitis.

## 2021-07-11 NOTE — Telephone Encounter (Signed)
Office visit 03/17/21 Follow-up in 8 to 12 weeks. Okay to refill?

## 2021-07-12 ENCOUNTER — Other Ambulatory Visit (HOSPITAL_COMMUNITY): Payer: Self-pay

## 2021-07-12 ENCOUNTER — Other Ambulatory Visit: Payer: Self-pay | Admitting: Internal Medicine

## 2021-07-12 LAB — COMPREHENSIVE METABOLIC PANEL
ALT: 25 U/L (ref 0–44)
AST: 28 U/L (ref 15–41)
Albumin: 3.4 g/dL — ABNORMAL LOW (ref 3.5–5.0)
Alkaline Phosphatase: 58 U/L (ref 38–126)
Anion gap: 9 (ref 5–15)
BUN: 10 mg/dL (ref 6–20)
CO2: 22 mmol/L (ref 22–32)
Calcium: 8 mg/dL — ABNORMAL LOW (ref 8.9–10.3)
Chloride: 101 mmol/L (ref 98–111)
Creatinine, Ser: 0.68 mg/dL (ref 0.61–1.24)
GFR, Estimated: >60 mL/min (ref >60)
Glucose, Bld: 168 mg/dL — ABNORMAL HIGH (ref 70–99)
Potassium: 4.3 mmol/L (ref 3.5–5.1)
Sodium: 132 mmol/L — ABNORMAL LOW (ref 135–145)
Total Bilirubin: 2.1 mg/dL — ABNORMAL HIGH (ref 0.3–1.2)
Total Protein: 5.5 g/dL — ABNORMAL LOW (ref 6.5–8.1)

## 2021-07-12 LAB — CBG MONITORING, ED: Glucose-Capillary: 174 mg/dL — ABNORMAL HIGH (ref 70–99)

## 2021-07-12 LAB — LIPASE, BLOOD: Lipase: 196 U/L — ABNORMAL HIGH (ref 11–51)

## 2021-07-12 MED ORDER — SODIUM CHLORIDE 0.9 % IV BOLUS
1000.0000 mL | Freq: Once | INTRAVENOUS | Status: AC
  Administered 2021-07-12: 1000 mL via INTRAVENOUS

## 2021-07-12 MED ORDER — OXYCODONE-ACETAMINOPHEN 5-325 MG PO TABS: 1.0000 | ORAL_TABLET | Freq: Four times a day (QID) | ORAL | 0 refills | Status: DC | PRN

## 2021-07-12 MED ORDER — ONDANSETRON HCL 4 MG/2ML IJ SOLN
4.0000 mg | Freq: Once | INTRAMUSCULAR | Status: AC
  Administered 2021-07-12: 4 mg via INTRAVENOUS
  Filled 2021-07-12: qty 2

## 2021-07-12 MED ORDER — ONDANSETRON 4 MG PO TBDP
4.0000 mg | ORAL_TABLET | Freq: Three times a day (TID) | ORAL | 0 refills | Status: DC | PRN
  Filled 2021-07-12: qty 20, 7d supply, fill #0

## 2021-07-12 MED ORDER — HYDROMORPHONE HCL 1 MG/ML IJ SOLN
1.0000 mg | Freq: Once | INTRAMUSCULAR | Status: AC
  Administered 2021-07-12: 1 mg via INTRAVENOUS
  Filled 2021-07-12: qty 1

## 2021-07-12 MED ORDER — OXYCODONE-ACETAMINOPHEN 5-325 MG PO TABS
1.0000 | ORAL_TABLET | Freq: Four times a day (QID) | ORAL | 0 refills | Status: DC | PRN
  Filled 2021-07-12: qty 20, 3d supply, fill #0

## 2021-07-12 MED ORDER — SERTRALINE HCL 100 MG PO TABS
100.0000 mg | ORAL_TABLET | Freq: Every day | ORAL | 0 refills | Status: DC
  Filled 2021-07-12: qty 30, 30d supply, fill #0

## 2021-07-12 NOTE — Telephone Encounter (Signed)
Medication refilled on 07/12/21 Pt appt scheduled for 07/26/21.

## 2021-07-12 NOTE — ED Provider Notes (Addendum)
Patient taken at sign out from [facility] [place], Hx of pancreatitis dx at time of initial DM diagnosis. C/o LUQ/Epigastric abdominal pain, nausea, vomiting. Elevated wbc's, labs pending.  Pain controlled at this time. PO challenging.  9:00 AM Reevaluated and continues to have significant pain.  Patient feels like he is still very dehydrated.  We will give a bolus of fluid and pain medications and reevaluate.   10:24 AM BP 133/72 (BP Location: Left Arm)   Pulse 94   Temp 98.9 F (37.2 C) (Oral)   Resp 15   Ht 5\' 4"  (1.626 m)   Wt 97.5 kg   SpO2 97%   BMI 36.90 kg/m  Patient feeling markedly improved.  Pain is minimal at this time.  He is Tolerating p.o. fluids.  Patient will be discharged with clear liquid diet, oral pain medications, antiemetics.  Patient feels comfortable with this plan at this time.  Discussed return precautions including intractable pain and vomiting.  Patient appears otherwise appropriate for discharge at this time     , Arthor Captain 07/12/21 1027    07/14/21, MD 07/12/21 1746

## 2021-07-12 NOTE — ED Provider Notes (Signed)
[facility] EMERGENCY DEPARTMENT Provider Note   CSN: [id number] Arrival date & time: 07/11/21  1745     History Chief Complaint  Patient presents with   Abdominal Pain    George Howe is a 35 y.o. male.  Patient to ED with upper abdominal pain that started yesterday morning. By noon with pain became severe and associated with nausea and vomiting. No fever, no diarrhea. He has a history of pancreatitis secondary to diabetes when he was diagnosed earlier in the year. He states since starting treatment, his A1c is 6.5 and he has not had any other pain episodes. No chest pain, SOB. No alcohol use. No previous abdominal surgeries.   The history is provided by the patient. No language interpreter was used.  Abdominal Pain Associated symptoms: vomiting   Associated symptoms: no chest pain, no chills, no diarrhea, no fever and no shortness of breath       Past Medical History:  Diagnosis Date   Chicken pox    Depression    Diabetes mellitus without complication ([place])    DM (diabetes mellitus) ([place])    Hay fever    Hyperlipidemia    Hyperlipidemia    Morbid obesity ([place])     Patient Active Problem List   Diagnosis Date Noted   GAD (generalized anxiety disorder) 05/01/2021   MDD (major depressive disorder), recurrent severe, without psychosis ([place]) 04/30/2021   Hyperlipidemia 03/08/2021   Morbid obesity ([place]) 03/08/2021   Diabetes mellitus ([place]) 01/19/2021   Hypertriglyceridemia     History reviewed. No pertinent surgical history.     Family History  Problem Relation Age of Onset   Diabetes Father    Diabetes Paternal Grandmother     Social History   Tobacco Use   Smoking status: Every Day    Packs/day: 1.00    Years: 17.00    Pack years: 17.00    Types: Cigarettes   Smokeless tobacco: Never  Vaping Use   Vaping Use: Never used  Substance Use Topics   Alcohol use: Yes    Comment: occasional   Drug use: Yes    Home  Medications Prior to Admission medications   Medication Sig Start Date End Date Taking? Authorizing Provider  albuterol (VENTOLIN HFA) 108 (90 Base) MCG/ACT inhaler Inhale 2 puffs into the lungs every 4 (four) hours as needed for wheezing or shortness of breath (chest tightness). 05/09/21  Yes Arthor Captain, MD  atorvastatin (LIPITOR) 40 MG tablet Take 1 tablet (40 mg total) by mouth daily. Patient taking differently: Take 40 mg by mouth at bedtime. 06/07/21  Yes Isaac Bliss, Rayford Halsted, MD  blood glucose meter kit and supplies KIT 1 each. Check blood sugar before meals and at bedtime   Yes [provider]  cetirizine (ZYRTEC) 10 MG tablet Take 10 mg by mouth daily as needed for allergies.   Yes [provider]  hydrOXYzine (ATARAX/VISTARIL) 25 MG tablet Take 1 tablet (25 mg total) by mouth 2 (two) times daily as needed for anxiety. 05/09/21  Yes Arthor Captain, MD  ibuprofen (ADVIL) 200 MG tablet Take 800 mg by mouth every 6 (six) hours as needed for headache or moderate pain.   Yes [provider]  insulin aspart (NOVOLOG) 100 UNIT/ML FlexPen Inject 4 units with each meal. Additionally, add 3 units for each 40 points above 140. 02/15/21  Yes Jose Persia, MD  insulin glargine (LANTUS) 100 UNIT/ML Solostar Pen Inject 30 Units into the skin at  bedtime. 01/19/21  Yes Mosetta Anis, MD  Insulin Pen Needle (PEN NEEDLES) 32G X 4 MM MISC Use 1 pen needle to inject insulin 4 times per day. 01/11/21  Yes Jose Persia, MD  Multiple Vitamins-Minerals (MULTIVITAMIN WITH MINERALS) tablet Take 1 tablet by mouth daily.   Yes [provider]  naproxen sodium (ALEVE) 220 MG tablet Take 440 mg by mouth daily as needed (pain).   Yes [provider]  sertraline (ZOLOFT) 100 MG tablet Take 1 tablet (100 mg total) by mouth daily. 05/10/21  Yes Arthor Captain, MD    Allergies    Patient has no known allergies.  Review of Systems   Review of Systems   Constitutional:  Negative for chills and fever.  HENT: Negative.    Respiratory: Negative.  Negative for shortness of breath.   Cardiovascular: Negative.  Negative for chest pain.  Gastrointestinal:  Positive for abdominal pain and vomiting. Negative for diarrhea.  Musculoskeletal: Negative.   Skin: Negative.   Neurological: Negative.    Physical Exam Updated Vital Signs BP (!) 157/90 (BP Location: Left Arm)   Pulse 94   Temp 97.8 F (36.6 C) (Oral)   Resp 15   SpO2 98%   Physical Exam Vitals and nursing note reviewed.  Constitutional:      Appearance: He is well-developed. He is obese.  Cardiovascular:     Rate and Rhythm: Normal rate and regular rhythm.  Pulmonary:     Effort: Pulmonary effort is normal.     Breath sounds: No wheezing, rhonchi or rales.  Abdominal:     General: Bowel sounds are decreased. There is no distension.     Palpations: Abdomen is soft.     Tenderness: There is abdominal tenderness in the epigastric area and left upper quadrant. There is guarding.  Skin:    General: Skin is warm and dry.  Neurological:     Mental Status: He is alert.    ED Results / Procedures / Treatments   Labs (all labs ordered are listed, but only abnormal results are displayed) Labs Reviewed  CBC - Abnormal; Notable for the following components:      Result Value   WBC 17.4 (*)    Hemoglobin 17.5 (*)    MCHC 36.8 (*)    All other components within normal limits  URINALYSIS, ROUTINE W REFLEX MICROSCOPIC - Abnormal; Notable for the following components:   APPearance CLOUDY (*)    Specific Gravity, Urine 1.031 (*)    Glucose, UA 50 (*)    Ketones, ur 5 (*)    Protein, ur 30 (*)    Bacteria, UA RARE (*)    All other components within normal limits  CBG MONITORING, ED - Abnormal; Notable for the following components:   Glucose-Capillary 238 (*)    All other components within normal limits  COMPREHENSIVE METABOLIC PANEL  LIPASE, BLOOD   Results for orders placed  or performed during the hospital encounter of 07/11/21  CBC  Result Value Ref Range   WBC 17.4 (H) 4.0 - 10.5 K/uL   RBC 5.70 4.22 - 5.81 MIL/uL   Hemoglobin 17.5 (H) 13.0 - 17.0 g/dL   HCT 47.5 39.0 - 52.0 %   MCV 83.3 80.0 - 100.0 fL   MCH 30.7 26.0 - 34.0 pg   MCHC 36.8 (H) 30.0 - 36.0 g/dL   RDW 13.2 11.5 - 15.5 %   Platelets 273 150 - 400 K/uL   nRBC 0.0 0.0 - 0.2 %  Urinalysis, Routine w reflex microscopic Urine, Clean Catch  Result Value Ref Range   Color, Urine YELLOW YELLOW   APPearance CLOUDY (A) CLEAR   Specific Gravity, Urine 1.031 (H) 1.005 - 1.030   pH 5.0 5.0 - 8.0   Glucose, UA 50 (A) NEGATIVE mg/dL   Hgb urine dipstick NEGATIVE NEGATIVE   Bilirubin Urine NEGATIVE NEGATIVE   Ketones, ur 5 (A) NEGATIVE mg/dL   Protein, ur 30 (A) NEGATIVE mg/dL   Nitrite NEGATIVE NEGATIVE   Leukocytes,Ua NEGATIVE NEGATIVE   RBC / HPF 0-5 0 - 5 RBC/hpf   WBC, UA 0-5 0 - 5 WBC/hpf   Bacteria, UA RARE (A) NONE SEEN   Squamous Epithelial / LPF 0-5 0 - 5   Mucus PRESENT    Amorphous Crystal PRESENT   CBG monitoring, ED  Result Value Ref Range   Glucose-Capillary 238 (H) 70 - 99 mg/dL    EKG None  Radiology No results found.  Procedures Procedures   Medications Ordered in ED Medications  sodium chloride 0.9 % bolus 1,000 mL (has no administration in time range)  HYDROmorphone (DILAUDID) injection 1 mg (has no administration in time range)  ondansetron (ZOFRAN) injection 4 mg (has no administration in time range)    ED Course  I have reviewed the triage vital signs and the nursing notes.  Pertinent labs & imaging results that were available during my care of the patient were reviewed by me and considered in my medical decision making (see chart for details).    MDM Rules/Calculators/A&P                           Patient to ED with LUQ abdominal pain, nausea and vomiting since yesterday morning. History of pancreatitis that felt the same. No fever.   Overall, the  patient is well appearing and in NAD. VSS. IVF's ordered. Labs pending.   CMET and Lipase were again left unresulted by lab, and again redrawn and sent for testing.   The patient reports his pain is improved. Nausea controlled. He is PO challenging with water. Patient care signed out to [facility] PA-C.  Final Clinical Impression(s) / ED Diagnoses Final diagnoses:  None     LUQ abdominal pain History of pancreatitis  Rx / DC Orders ED Discharge Orders     None        Charlann Lange, PA-C 07/12/21 0654    Maudie Flakes, MD 07/12/21 [phone]

## 2021-07-12 NOTE — ED Notes (Signed)
Pt verbalized understanding of d/c instructions, meds and followup care.  Pt given paper prescription for percocet and e-script sent to pharmacy for zofran. Pt verbalizes understanding of prescriptions. Denies questions. VSS, no distress noted. Steady gait to exit with all belongings.

## 2021-07-12 NOTE — Discharge Instructions (Addendum)
Contact a health care provider if you: Do not recover as quickly as expected. Develop new or worsening symptoms. Have persistent pain, weakness, or nausea. Recover and then have another episode of pain. Have a fever. Get help right away if: You cannot eat or keep fluids down. Your pain becomes severe. Your skin or the white part of your eyes turns yellow (jaundice). You have sudden swelling in your abdomen. You vomit. You feel dizzy or you faint. Your blood sugar is high (over 300 mg/dL).

## 2021-07-13 ENCOUNTER — Other Ambulatory Visit (HOSPITAL_COMMUNITY): Payer: Self-pay

## 2021-07-25 ENCOUNTER — Other Ambulatory Visit: Payer: Self-pay

## 2021-07-26 ENCOUNTER — Encounter: Payer: Self-pay | Admitting: Internal Medicine

## 2021-07-26 ENCOUNTER — Ambulatory Visit (INDEPENDENT_AMBULATORY_CARE_PROVIDER_SITE_OTHER): Payer: 59 | Admitting: Internal Medicine

## 2021-07-26 ENCOUNTER — Other Ambulatory Visit (HOSPITAL_COMMUNITY): Payer: Self-pay

## 2021-07-26 VITALS — BP 120/84 | HR 73 | Temp 98.0°F | Wt 217.9 lb

## 2021-07-26 DIAGNOSIS — Z794 Long term (current) use of insulin: Secondary | ICD-10-CM

## 2021-07-26 DIAGNOSIS — E1169 Type 2 diabetes mellitus with other specified complication: Secondary | ICD-10-CM

## 2021-07-26 DIAGNOSIS — F332 Major depressive disorder, recurrent severe without psychotic features: Secondary | ICD-10-CM

## 2021-07-26 DIAGNOSIS — Z23 Encounter for immunization: Secondary | ICD-10-CM

## 2021-07-26 DIAGNOSIS — E782 Mixed hyperlipidemia: Secondary | ICD-10-CM | POA: Diagnosis not present

## 2021-07-26 DIAGNOSIS — G5603 Carpal tunnel syndrome, bilateral upper limbs: Secondary | ICD-10-CM

## 2021-07-26 LAB — POCT GLYCOSYLATED HEMOGLOBIN (HGB A1C): Hemoglobin A1C: 7.9 % — AB (ref 4.0–5.6)

## 2021-07-26 MED ORDER — FREESTYLE LIBRE 3 SENSOR MISC
1.0000 | Freq: Every day | 2 refills | Status: DC
  Filled 2021-07-26 – 2021-07-27 (×2): qty 2, 28d supply, fill #0

## 2021-07-26 NOTE — Patient Instructions (Signed)
-  Nice seeing you today!!  -Flu vaccine today.  -Remember your COVID booster at the pharmacy.  -Increase lantus to 33 units at bedtime.  -Use the freestyle libre and exchange every 14 days.  -Schedule follow up in 3 months.

## 2021-07-26 NOTE — Progress Notes (Signed)
Established Patient Office Visit     This visit occurred during the SARS-CoV-2 public health emergency.  Safety protocols were in place, including screening questions prior to the visit, additional usage of staff PPE, and extensive cleaning of exam room while observing appropriate contact time as indicated for disinfecting solutions.    CC/Reason for Visit: Follow-up chronic conditions  HPI: George Howe is a 35 y.o. male who is coming in today for the above mentioned reasons. Past Medical History is significant for: Diabetes that he developed after a bout of pancreatitis earlier this year, severe major depression that required an inpatient stay at behavioral health for suicidal ideation/intent.  He has been doing well on sertraline and continued counseling.  He is requesting a flu vaccine today.  He works in [employer] at a [employer] 8 to 10 hours a day.  He has significant carpal tunnel syndrome, significant numbness and pain affecting his thumb, index and middle finger of both hands.   Past Medical/Surgical History: Past Medical History:  Diagnosis Date   Chicken pox    Depression    Diabetes mellitus without complication ([place])    DM (diabetes mellitus) ([place])    Hay fever    Hyperlipidemia    Hyperlipidemia    Morbid obesity ([place])     No past surgical history on file.  Social History:  reports that he has been smoking cigarettes. He has a 17.00 pack-year smoking history. He has never used smokeless tobacco. He reports current alcohol use. He reports current drug use.  Allergies: No Known Allergies  Family History:  Family History  Problem Relation Age of Onset   Diabetes Father    Diabetes Paternal Grandmother      Current Outpatient Medications:    atorvastatin (LIPITOR) 40 MG tablet, Take 1 tablet (40 mg total) by mouth daily. (Patient taking differently: Take 40 mg by mouth at bedtime.), Disp: 90 tablet, Rfl: 0   blood glucose meter kit and  supplies KIT, 1 each. Check blood sugar before meals and at bedtime, Disp: , Rfl:    cetirizine (ZYRTEC) 10 MG tablet, Take 10 mg by mouth daily as needed for allergies., Disp: , Rfl:    Continuous Blood Gluc Sensor (FREESTYLE LIBRE 3 SENSOR) MISC, 1 each by Does not apply route daily. Place 1 sensor on the skin every 14 days. Use to check glucose continuously, Disp: 2 each, Rfl: 2   ibuprofen (ADVIL) 200 MG tablet, Take 800 mg by mouth every 6 (six) hours as needed for headache or moderate pain., Disp: , Rfl:    insulin aspart (NOVOLOG) 100 UNIT/ML FlexPen, Inject 4 units with each meal. Additionally, add 3 units for each 40 points above 140., Disp: 12 mL, Rfl: 6   insulin glargine (LANTUS) 100 UNIT/ML Solostar Pen, Inject 30 Units into the skin at bedtime., Disp: 21 mL, Rfl: 2   Insulin Pen Needle (PEN NEEDLES) 32G X 4 MM MISC, Use 1 pen needle to inject insulin 4 times per day., Disp: 200 each, Rfl: 11   Multiple Vitamins-Minerals (MULTIVITAMIN WITH MINERALS) tablet, Take 1 tablet by mouth daily., Disp: , Rfl:    ondansetron (ZOFRAN ODT) 4 MG disintegrating tablet, Take 1 tablet (4 mg total) by mouth every 8 (eight) hours as needed for nausea or vomiting., Disp: 20 tablet, Rfl: 0   sertraline (ZOLOFT) 100 MG tablet, Take 1 tablet (100 mg total) by mouth daily., Disp: 30 tablet, Rfl: 0   naproxen sodium (ALEVE)  220 MG tablet, Take 440 mg by mouth daily as needed (pain)., Disp: , Rfl:    oxyCODONE-acetaminophen (PERCOCET) 5-325 MG tablet, Take 1-2 tablets by mouth every 6 (six) hours as needed for severe pain., Disp: 20 tablet, Rfl: 0  Review of Systems:  Constitutional: Denies fever, chills, diaphoresis, appetite change and fatigue.  HEENT: Denies photophobia, eye pain, redness, hearing loss, ear pain, congestion, sore throat, rhinorrhea, sneezing, mouth sores, trouble swallowing, neck pain, neck stiffness and tinnitus.   Respiratory: Denies SOB, DOE, cough, chest tightness,  and wheezing.    Cardiovascular: Denies chest pain, palpitations and leg swelling.  Gastrointestinal: Denies nausea, vomiting, abdominal pain, diarrhea, constipation, blood in stool and abdominal distention.  Genitourinary: Denies dysuria, urgency, frequency, hematuria, flank pain and difficulty urinating.  Endocrine: Denies: hot or cold intolerance, sweats, changes in hair or nails, polyuria, polydipsia. Musculoskeletal: Denies myalgias, back pain, joint swelling, arthralgias and gait problem.  Skin: Denies pallor, rash and wound.  Neurological: Denies dizziness, seizures, syncope, weakness, light-headedness, numbness and headaches.  Hematological: Denies adenopathy. Easy bruising, personal or family bleeding history  Psychiatric/Behavioral: Denies suicidal ideation, mood changes, confusion, nervousness, sleep disturbance and agitation    Physical Exam: Vitals:   07/26/21 0927  BP: 120/84  Pulse: 73  Temp: 98 F (36.7 C)  TempSrc: Oral  SpO2: 97%  Weight: 217 lb 14.4 oz (98.8 kg)    Body mass index is 37.4 kg/m.   Constitutional: NAD, calm, comfortable, obese Eyes: PERRL, lids and conjunctivae normal ENMT: Mucous membranes are moist.  Respiratory: clear to auscultation bilaterally, no wheezing, no crackles. Normal respiratory effort. No accessory muscle use.  Cardiovascular: Regular rate and rhythm, no murmurs / rubs / gallops. No extremity edema.  Psychiatric: Normal judgment and insight. Alert and oriented x 3. Normal mood.    Impression and Plan:  Type 2 diabetes mellitus with other specified complication, with long-term current use of insulin ([place])  - Plan: POCT glycosylated hemoglobin (Hb A1C) -A1c has increased from 6.5 in August to 7.9 today. -His fasting CBGs remain above 130. -I will increase his Lantus from 30 to 33 units, he will continue NovoLog, do not believe he would be a candidate for GLP-1's given his history of pancreatitis. -Prescription and sample of freestyle libre 3  have been provided today as I believe continuous glucose monitoring will aid in diabetes management.  Mixed hyperlipidemia -Last lipid panel in August 2022 with a total cholesterol of 118, triglycerides 144 and LDL 53.  He is on atorvastatin 40 mg daily.  MDD (major depressive disorder), recurrent severe, without psychosis ([place]) -He is doing improved since his [facility] admission.  He remains on sertraline and continues counseling.  Morbid obesity ([place]) -Discussed healthy lifestyle, including increased physical activity and better food choices to promote weight loss.  Bilateral carpal tunnel syndrome -He has tried wrist splints and failed conservative management, will refer to orthopedics for discussion of injections versus release surgery.  Need for influenza vaccination -Flu vaccine administered today.  Time spent: 33 minutes reviewing chart, interviewing and examining patient and formulating plan of care.   Patient Instructions  -Nice seeing you today!!  -Flu vaccine today.  -Remember your COVID booster at the pharmacy.  -Increase lantus to 33 units at bedtime.  -Use the freestyle libre and exchange every 14 days.  -Schedule follow up in 3 months.    Lelon Frohlich, MD [facility]

## 2021-07-27 ENCOUNTER — Other Ambulatory Visit (HOSPITAL_COMMUNITY): Payer: Self-pay

## 2021-07-28 ENCOUNTER — Other Ambulatory Visit (HOSPITAL_COMMUNITY): Payer: Self-pay

## 2021-08-07 ENCOUNTER — Encounter: Payer: Self-pay | Admitting: Orthopedic Surgery

## 2021-08-07 ENCOUNTER — Other Ambulatory Visit (HOSPITAL_COMMUNITY): Payer: Self-pay

## 2021-08-07 ENCOUNTER — Other Ambulatory Visit: Payer: Self-pay | Admitting: Internal Medicine

## 2021-08-07 ENCOUNTER — Ambulatory Visit (INDEPENDENT_AMBULATORY_CARE_PROVIDER_SITE_OTHER): Payer: 59 | Admitting: Orthopedic Surgery

## 2021-08-07 ENCOUNTER — Other Ambulatory Visit: Payer: Self-pay

## 2021-08-07 DIAGNOSIS — G5603 Carpal tunnel syndrome, bilateral upper limbs: Secondary | ICD-10-CM | POA: Diagnosis not present

## 2021-08-07 NOTE — Progress Notes (Signed)
Office Visit Note   Patient: George Howe           Date of Birth: [birth year]           MRN: [id number] Visit Date: 08/07/2021              Requested by: [name], [name], MD [address] [place],  [place] [address] PCP: [name], [name], MD   Assessment & Plan: Visit Diagnoses:  1. Bilateral carpal tunnel syndrome     Plan: We discussed the diagnosis, prognosis, and both conservative and operative treatment options for carpal tunnel syndrome.  After our discussion, the patient has elected to proceed with carpal tunnel release.  We reviewed the benefits of surgery and the potential risks including, but not limited to, persistent symptoms, infection, damage to nearby nerves and blood vessels, delayed wound healing.    All patient concerns and questions were addressed.  He wants to wait until the new year to schedule surgery.   Follow-Up Instructions: No follow-ups on file.   Orders:  No orders of the defined types were placed in this encounter.  No orders of the defined types were placed in this encounter.     Procedures: No procedures performed   Clinical Data: No additional findings.   Subjective: Chief Complaint  Patient presents with   Right Hand - Numbness, Weakness, Edema    Right Handed, +n/t, weakness, swelling, Pain 10/10, no feeling in 1st-3rd digits at all most of the time, avoids hand shakes due to the pressure, drops things, pain all the time. 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  His symptoms have been present for at least a year and involve the thumb, index, middle, and part of his ring fingers.  He has worn night splints with minimal symptom relief.  He reports occasionally dropping things and feeling  increased clumsiness.  He denies any symptoms involving the small finger. He has a history of DM but denies thyroid disease, cervical spine symptoms, or wrist trauma.  He denies radiculopathy.   Weakness Associated symptoms include weakness.   Review of Systems  Neurological:  Positive for weakness.    Objective: Vital Signs: BP (!) 132/92 (BP Location: Left Arm, Patient Position: Sitting)   Pulse 69   Ht 5\' 3"  (1.6 m)   Wt 218 lb (98.9 kg)   BMI 38.62 kg/m   Physical Exam Constitutional:      Appearance: Normal appearance.  Cardiovascular:     Rate and Rhythm: Normal rate.     Pulses: Normal pulses.  Pulmonary:     Effort: Pulmonary effort is normal.  Skin:    General: Skin is warm and dry.     Capillary Refill: Capillary refill takes less than 2 seconds.  Neurological:     Mental Status: He is alert.    Right Hand Exam   Tenderness  The patient is experiencing no tenderness.   Range of Motion  The patient has normal right wrist ROM.   Muscle Strength  The patient has normal right wrist strength.  Other  Sensation: normal Pulse: present  Comments:  + Tinel at wrist.  + Carpal tunnel compression test.  + Phalen test.  4+/5 thenar motor strength with no  atrophy.  Negative Tinel at elbow.    Left Hand Exam   Tenderness  The patient is experiencing no tenderness.   Range of Motion  The patient has normal left wrist ROM.  Muscle Strength  The patient has normal left wrist strength.  Other  Sensation: normal Pulse: present  Comments:  + Tinel at wrist.  + Carpal tunnel compression test.  + Phalen test.  4+/5 thenar motor strength with no atrophy.  Negative Tinel at elbow.      Specialty Comments:  No specialty comments available.  Imaging: No results found.   PMFS History: Patient Active Problem List   Diagnosis Date Noted   Bilateral carpal tunnel syndrome 08/07/2021   GAD (generalized anxiety disorder) 05/01/2021   MDD (major depressive  disorder), recurrent severe, without psychosis ([place]) 04/30/2021   Hyperlipidemia 03/08/2021   Morbid obesity ([place]) 03/08/2021   Diabetes mellitus ([place]) 01/19/2021   Hypertriglyceridemia    Past Medical History:  Diagnosis Date   Chicken pox    Depression    Diabetes mellitus without complication ([place])    DM (diabetes mellitus) ([place])    Hay fever    Hyperlipidemia    Hyperlipidemia    Morbid obesity ([place])     Family History  Problem Relation Age of Onset   Diabetes Father    Diabetes Paternal Grandmother     No past surgical history on file. Social History   Occupational History   Not on file  Tobacco Use   Smoking status: Every Day    Packs/day: 1.00    Years: 17.00    Pack years: 17.00    Types: Cigarettes   Smokeless tobacco: Never  Vaping Use   Vaping Use: Never used  Substance and Sexual Activity   Alcohol use: Yes    Comment: occasional   Drug use: Yes   Sexual activity: Yes

## 2021-08-08 ENCOUNTER — Other Ambulatory Visit (HOSPITAL_COMMUNITY): Payer: Self-pay

## 2021-08-08 MED ORDER — SERTRALINE HCL 100 MG PO TABS
100.0000 mg | ORAL_TABLET | Freq: Every day | ORAL | 0 refills | Status: DC
  Filled 2021-08-08 – 2021-08-18 (×2): qty 90, 90d supply, fill #0

## 2021-08-16 ENCOUNTER — Other Ambulatory Visit (HOSPITAL_COMMUNITY): Payer: Self-pay

## 2021-08-18 ENCOUNTER — Other Ambulatory Visit (HOSPITAL_COMMUNITY): Payer: Self-pay

## 2021-10-06 ENCOUNTER — Telehealth: Payer: Self-pay | Admitting: Orthopedic Surgery

## 2021-10-06 NOTE — Telephone Encounter (Signed)
Patient saw doctor back in November, and discussed CTR on both hands. Patient is now ready to schedule surgery on his right hand first. Patient would like to proceed ASAP. Please advise.

## 2021-10-10 ENCOUNTER — Other Ambulatory Visit: Payer: Self-pay | Admitting: Internal Medicine

## 2021-10-10 ENCOUNTER — Other Ambulatory Visit (HOSPITAL_COMMUNITY): Payer: Self-pay

## 2021-10-10 DIAGNOSIS — E781 Pure hyperglyceridemia: Secondary | ICD-10-CM

## 2021-10-10 DIAGNOSIS — E1310 Other specified diabetes mellitus with ketoacidosis without coma: Secondary | ICD-10-CM

## 2021-10-11 ENCOUNTER — Other Ambulatory Visit (HOSPITAL_COMMUNITY): Payer: Self-pay

## 2021-10-11 ENCOUNTER — Encounter: Payer: Self-pay | Admitting: Internal Medicine

## 2021-10-11 MED ORDER — ATORVASTATIN CALCIUM 40 MG PO TABS
40.0000 mg | ORAL_TABLET | Freq: Every day | ORAL | 1 refills | Status: DC
  Filled 2021-10-11: qty 90, 90d supply, fill #0

## 2021-10-11 MED ORDER — INSULIN GLARGINE-YFGN 100 UNIT/ML ~~LOC~~ SOPN
33.0000 [IU] | PEN_INJECTOR | Freq: Every day | SUBCUTANEOUS | 1 refills | Status: DC
  Filled 2021-10-11: qty 15, 45d supply, fill #0
  Filled 2021-12-18: qty 15, 45d supply, fill #1

## 2021-10-11 NOTE — Telephone Encounter (Signed)
Lantus is no longer the preferred, okay to change to [facility]?

## 2021-10-12 NOTE — Telephone Encounter (Signed)
LVM instructions for pt to call office to schedule appt with PCP. Will also send  request in mychart.

## 2021-10-12 NOTE — Telephone Encounter (Signed)
Patient called for a second time to discuss surgery.Patient would like to schedule for his right hand first. Please call patient to discuss at [phone].

## 2021-10-13 ENCOUNTER — Other Ambulatory Visit: Payer: Self-pay

## 2021-10-13 ENCOUNTER — Encounter (HOSPITAL_BASED_OUTPATIENT_CLINIC_OR_DEPARTMENT_OTHER): Payer: Self-pay | Admitting: Orthopedic Surgery

## 2021-10-16 ENCOUNTER — Ambulatory Visit: Payer: 59 | Admitting: Internal Medicine

## 2021-10-16 NOTE — Telephone Encounter (Signed)
Per patient, he spoke with Dr. Ralene Ok. Surgery has been scheduled for patients right hand for 10/18/21.

## 2021-10-17 ENCOUNTER — Encounter (HOSPITAL_BASED_OUTPATIENT_CLINIC_OR_DEPARTMENT_OTHER)
Admission: RE | Admit: 2021-10-17 | Discharge: 2021-10-17 | Disposition: A | Discharge status: Home / Self Care | Payer: 59 | Source: Ambulatory Visit | Attending: Orthopedic Surgery | Admitting: Orthopedic Surgery

## 2021-10-17 DIAGNOSIS — G5601 Carpal tunnel syndrome, right upper limb: Secondary | ICD-10-CM | POA: Diagnosis present

## 2021-10-17 DIAGNOSIS — Z6838 Body mass index (BMI) 38.0-38.9, adult: Secondary | ICD-10-CM | POA: Diagnosis not present

## 2021-10-17 DIAGNOSIS — F17210 Nicotine dependence, cigarettes, uncomplicated: Secondary | ICD-10-CM | POA: Diagnosis not present

## 2021-10-17 DIAGNOSIS — Z794 Long term (current) use of insulin: Secondary | ICD-10-CM | POA: Diagnosis not present

## 2021-10-17 DIAGNOSIS — E119 Type 2 diabetes mellitus without complications: Secondary | ICD-10-CM | POA: Diagnosis not present

## 2021-10-17 LAB — BASIC METABOLIC PANEL
Anion gap: 8 (ref 5–15)
BUN: 12 mg/dL (ref 6–20)
CO2: 26 mmol/L (ref 22–32)
Calcium: 8.7 mg/dL — ABNORMAL LOW (ref 8.9–10.3)
Chloride: 105 mmol/L (ref 98–111)
Creatinine, Ser: 0.96 mg/dL (ref 0.61–1.24)
GFR, Estimated: >60 mL/min (ref >60)
Glucose, Bld: 232 mg/dL — ABNORMAL HIGH (ref 70–99)
Potassium: 4.8 mmol/L (ref 3.5–5.1)
Sodium: 139 mmol/L (ref 135–145)

## 2021-10-17 NOTE — Progress Notes (Signed)
Sent text reminding pt to come in for lab work and an EKG.

## 2021-10-18 ENCOUNTER — Ambulatory Visit (HOSPITAL_BASED_OUTPATIENT_CLINIC_OR_DEPARTMENT_OTHER): Payer: 59 | Admitting: Anesthesiology

## 2021-10-18 ENCOUNTER — Encounter (HOSPITAL_BASED_OUTPATIENT_CLINIC_OR_DEPARTMENT_OTHER): Payer: Self-pay | Admitting: Orthopedic Surgery

## 2021-10-18 ENCOUNTER — Ambulatory Visit (HOSPITAL_BASED_OUTPATIENT_CLINIC_OR_DEPARTMENT_OTHER)
Admission: RE | Admit: 2021-10-18 | Discharge: 2021-10-18 | Disposition: A | Discharge status: Home / Self Care | Payer: 59 | Source: Ambulatory Visit | Attending: Orthopedic Surgery | Admitting: Orthopedic Surgery

## 2021-10-18 ENCOUNTER — Other Ambulatory Visit (HOSPITAL_COMMUNITY): Payer: Self-pay

## 2021-10-18 ENCOUNTER — Encounter (HOSPITAL_BASED_OUTPATIENT_CLINIC_OR_DEPARTMENT_OTHER)
Admission: RE | Disposition: A | Discharge status: Home / Self Care | Payer: Self-pay | Source: Ambulatory Visit | Attending: Orthopedic Surgery

## 2021-10-18 ENCOUNTER — Other Ambulatory Visit: Payer: Self-pay

## 2021-10-18 DIAGNOSIS — F17210 Nicotine dependence, cigarettes, uncomplicated: Secondary | ICD-10-CM | POA: Insufficient documentation

## 2021-10-18 DIAGNOSIS — E119 Type 2 diabetes mellitus without complications: Secondary | ICD-10-CM | POA: Insufficient documentation

## 2021-10-18 DIAGNOSIS — G5601 Carpal tunnel syndrome, right upper limb: Secondary | ICD-10-CM | POA: Insufficient documentation

## 2021-10-18 DIAGNOSIS — Z794 Long term (current) use of insulin: Secondary | ICD-10-CM | POA: Insufficient documentation

## 2021-10-18 DIAGNOSIS — Z6838 Body mass index (BMI) 38.0-38.9, adult: Secondary | ICD-10-CM | POA: Insufficient documentation

## 2021-10-18 DIAGNOSIS — E1169 Type 2 diabetes mellitus with other specified complication: Secondary | ICD-10-CM

## 2021-10-18 HISTORY — DX: Other specified postprocedural states: R11.2

## 2021-10-18 HISTORY — PX: CARPAL TUNNEL RELEASE: SHX101

## 2021-10-18 HISTORY — DX: Nausea with vomiting, unspecified: Z98.890

## 2021-10-18 LAB — GLUCOSE, CAPILLARY
Glucose-Capillary: 181 mg/dL — ABNORMAL HIGH (ref 70–99)
Glucose-Capillary: 149 mg/dL — ABNORMAL HIGH (ref 70–99)

## 2021-10-18 SURGERY — CARPAL TUNNEL RELEASE
Anesthesia: Monitor Anesthesia Care | Laterality: Right

## 2021-10-18 MED ORDER — PROPOFOL 500 MG/50ML IV EMUL
INTRAVENOUS | Status: DC | PRN
  Administered 2021-10-18: 75 ug/kg/min via INTRAVENOUS

## 2021-10-18 MED ORDER — LIDOCAINE HCL (PF) 1 % IJ SOLN
INTRAMUSCULAR | Status: DC | PRN
  Administered 2021-10-18: 10 mL

## 2021-10-18 MED ORDER — LIDOCAINE HCL (CARDIAC) PF 100 MG/5ML IV SOSY
PREFILLED_SYRINGE | INTRAVENOUS | Status: DC | PRN
  Administered 2021-10-18: 20 mg via INTRAVENOUS

## 2021-10-18 MED ORDER — MIDAZOLAM HCL 2 MG/2ML IJ SOLN
INTRAMUSCULAR | Status: AC
  Filled 2021-10-18: qty 2

## 2021-10-18 MED ORDER — PHENYLEPHRINE 40 MCG/ML (10ML) SYRINGE FOR IV PUSH (FOR BLOOD PRESSURE SUPPORT)
PREFILLED_SYRINGE | INTRAVENOUS | Status: AC
  Filled 2021-10-18: qty 10

## 2021-10-18 MED ORDER — ONDANSETRON HCL 4 MG/2ML IJ SOLN
INTRAMUSCULAR | Status: AC
  Filled 2021-10-18: qty 2

## 2021-10-18 MED ORDER — EPHEDRINE 5 MG/ML INJ
INTRAVENOUS | Status: AC
  Filled 2021-10-18: qty 5

## 2021-10-18 MED ORDER — SUCCINYLCHOLINE CHLORIDE 200 MG/10ML IV SOSY
PREFILLED_SYRINGE | INTRAVENOUS | Status: AC
  Filled 2021-10-18: qty 10

## 2021-10-18 MED ORDER — OXYCODONE HCL 5 MG PO TABS
5.0000 mg | ORAL_TABLET | Freq: Four times a day (QID) | ORAL | 0 refills | Status: AC | PRN
  Filled 2021-10-18: qty 8, 2d supply, fill #0

## 2021-10-18 MED ORDER — FENTANYL CITRATE (PF) 100 MCG/2ML IJ SOLN
INTRAMUSCULAR | Status: AC
  Filled 2021-10-18: qty 2

## 2021-10-18 MED ORDER — MIDAZOLAM HCL 5 MG/5ML IJ SOLN
INTRAMUSCULAR | Status: DC | PRN
  Administered 2021-10-18: 2 mg via INTRAVENOUS

## 2021-10-18 MED ORDER — OXYCODONE HCL 5 MG/5ML PO SOLN: 5.0000 mg | Freq: Once | ORAL | Status: DC | PRN

## 2021-10-18 MED ORDER — LIDOCAINE 2% (20 MG/ML) 5 ML SYRINGE
INTRAMUSCULAR | Status: AC
  Filled 2021-10-18: qty 5

## 2021-10-18 MED ORDER — OXYCODONE HCL 5 MG PO TABS: 5.0000 mg | ORAL_TABLET | Freq: Once | ORAL | Status: DC | PRN

## 2021-10-18 MED ORDER — HYDROMORPHONE HCL 1 MG/ML IJ SOLN: 0.2500 mg | INTRAMUSCULAR | Status: DC | PRN

## 2021-10-18 MED ORDER — ATROPINE SULFATE 0.4 MG/ML IV SOLN
INTRAVENOUS | Status: AC
  Filled 2021-10-18: qty 1

## 2021-10-18 MED ORDER — FENTANYL CITRATE (PF) 100 MCG/2ML IJ SOLN
INTRAMUSCULAR | Status: DC | PRN
  Administered 2021-10-18 (×2): 50 ug via INTRAVENOUS

## 2021-10-18 MED ORDER — PROMETHAZINE HCL 25 MG/ML IJ SOLN: 6.2500 mg | INTRAMUSCULAR | Status: DC | PRN

## 2021-10-18 MED ORDER — LACTATED RINGERS IV SOLN: INTRAVENOUS | Status: DC

## 2021-10-18 MED ORDER — ONDANSETRON HCL 4 MG/2ML IJ SOLN
INTRAMUSCULAR | Status: DC | PRN
  Administered 2021-10-18: 4 mg via INTRAVENOUS

## 2021-10-18 SURGICAL SUPPLY — 31 items
APL PRP STRL LF DISP 70% ISPRP (MISCELLANEOUS) ×1
BLADE SURG 15 STRL LF DISP TIS (BLADE) ×1 IMPLANT
BLADE SURG 15 STRL SS (BLADE) ×2
BNDG CMPR 9X4 STRL LF SNTH (GAUZE/BANDAGES/DRESSINGS) ×1
BNDG ELASTIC 3X5.8 VLCR STR LF (GAUZE/BANDAGES/DRESSINGS) ×2 IMPLANT
BNDG ESMARK 4X9 LF (GAUZE/BANDAGES/DRESSINGS) ×2 IMPLANT
BNDG GAUZE ELAST 4 BULKY (GAUZE/BANDAGES/DRESSINGS) ×2 IMPLANT
CHLORAPREP W/TINT 26 (MISCELLANEOUS) ×2 IMPLANT
CORD BIPOLAR FORCEPS 12FT (ELECTRODE) ×2 IMPLANT
COVER BACK TABLE 60X90IN (DRAPES) ×2 IMPLANT
COVER MAYO STAND STRL (DRAPES) ×2 IMPLANT
CUFF TOURN SGL QUICK 18 NS (TOURNIQUET CUFF) ×1 IMPLANT
DRAPE EXTREMITY T 121X128X90 (DISPOSABLE) ×2 IMPLANT
DRAPE SURG 17X23 STRL (DRAPES) ×2 IMPLANT
GLOVE SURG ENC MOIS LTX SZ7 (GLOVE) ×2 IMPLANT
GLOVE SURG UNDER POLY LF SZ7 (GLOVE) ×2 IMPLANT
GOWN STRL REUS W/ TWL LRG LVL3 (GOWN DISPOSABLE) ×1 IMPLANT
GOWN STRL REUS W/TWL LRG LVL3 (GOWN DISPOSABLE) ×2
GOWN STRL REUS W/TWL XL LVL3 (GOWN DISPOSABLE) ×2 IMPLANT
NDL HYPO 25X1 1.5 SAFETY (NEEDLE) IMPLANT
NEEDLE HYPO 25X1 1.5 SAFETY (NEEDLE) ×2 IMPLANT
NS IRRIG 1000ML POUR BTL (IV SOLUTION) ×2 IMPLANT
PACK BASIN DAY SURGERY FS (CUSTOM PROCEDURE TRAY) ×2 IMPLANT
SUCTION FRAZIER HANDLE 10FR (MISCELLANEOUS)
SUCTION TUBE FRAZIER 10FR DISP (MISCELLANEOUS) IMPLANT
SUT ETHILON 4 0 PS 2 18 (SUTURE) ×2 IMPLANT
SYR BULB EAR ULCER 3OZ GRN STR (SYRINGE) ×2 IMPLANT
SYR CONTROL 10ML LL (SYRINGE) ×1 IMPLANT
TOWEL GREEN STERILE FF (TOWEL DISPOSABLE) ×3 IMPLANT
TUBE CONNECTING 20X1/4 (TUBING) IMPLANT
UNDERPAD 30X36 HEAVY ABSORB (UNDERPADS AND DIAPERS) ×2 IMPLANT

## 2021-10-18 NOTE — Discharge Instructions (Addendum)
 Charles Benfield, M.D. Hand Surgery  POST-OPERATIVE DISCHARGE INSTRUCTIONS   PRESCRIPTIONS: - You have been given a prescription to be taken as directed for post-operative pain control.  You may also take over the counter ibuprofen/aleve and tylenol for pain. Take this as directed on the packaging. Do not exceed 3000 mg tylenol/acetaminophen in 24 hours.  Ibuprofen 600-800 mg (3-4) tablets by mouth every 6 hours as needed for pain.   OR  Aleve 2 tablets by mouth every 12 hours (twice daily) as needed for pain.   AND/OR  Tylenol 1000 mg (2 tablets) every 8 hours as needed for pain.  - Please use your pain medication carefully, as refills are limited and you may not be provided with one.  As stated above, please use over the counter pain medicine - it will also be helpful with decreasing your swelling.    ANESTHESIA: -After your surgery, post-surgical discomfort or pain is likely. This discomfort can last several days to a few weeks. At certain times of the day your discomfort may be more intense.   Did you receive a nerve block?   - A nerve block can provide pain relief for one hour to two days after your surgery. As long as the nerve block is working, you will experience little or no sensation in the area the surgeon operated on.  - As the nerve block wears off, you will begin to experience pain or discomfort. It is very important that you begin taking your prescribed pain medication before the nerve block fully wears off. Treating your pain at the first sign of the block wearing off will ensure your pain is better controlled and more tolerable when full-sensation returns. Do not wait until the pain is intolerable, as the medicine will be less effective. It is better to treat pain in advance than to try and catch up.   General Anesthesia:  If you did not receive a nerve block during your surgery, you will need to start taking your pain medication shortly after your surgery and  should continue to do so as prescribed by your surgeon.     ICE AND ELEVATION: - You may use ice for the first 48-72 hours, but it is not critical.   - Motion of your fingers is very important to decrease the swelling.  - Elevation, as much as possible for the next 48 hours, is critical for decreasing swelling as well as for pain relief. Elevation means when you are seated or lying down, you hand should be at or above your heart. When walking, the hand needs to be at or above the level of your elbow.  - If the bandage gets too tight, it may need to be loosened. Please contact our office and we will instruct you in how to do this.    SURGICAL BANDAGES:  - Keep your dressing and/or splint clean and dry at all times.  You can remove your dressing 4 days from now and change with a dry dressing or Band-Aids as needed thereafter. - You may place a plastic bag over your bandage to shower, but be careful, do not get your bandages wet.  - After the bandages have been removed, it is OK to get the stitches wet in a shower or with hand washing. Do Not soak or submerge the wound yet. Please do not use lotions or creams on the stitches.      HAND THERAPY:  - You may not need any. If you do,   we will begin this at your follow up visit in the clinic.    ACTIVITY AND WORK: - You are encouraged to move any fingers which are not in the bandage.  - Light use of the fingers is allowed to assist the other hand with daily hygiene and eating, but strong gripping or lifting is often uncomfortable and should be avoided.  - You might miss a variable period of time from work and hopefully this issue has been discussed prior to surgery. You may not do any heavy work with your affected hand for about 2 weeks.    [facility] [address] [place],  [place]  [address] [phone]   Post Anesthesia Home Care Instructions  Activity: Get plenty of rest for the remainder of the day. A responsible  individual must stay with you for 24 hours following the procedure.  For the next 24 hours, DO NOT: -Drive a car -Operate machinery -Drink alcoholic beverages -Take any medication unless instructed by your physician -Make any legal decisions or sign important papers.  Meals: Start with liquid foods such as gelatin or soup. Progress to regular foods as tolerated. Avoid greasy, spicy, heavy foods. If nausea and/or vomiting occur, drink only clear liquids until the nausea and/or vomiting subsides. Call your physician if vomiting continues.  Special Instructions/Symptoms: Your throat may feel dry or sore from the anesthesia or the breathing tube placed in your throat during surgery. If this causes discomfort, gargle with warm salt water. The discomfort should disappear within 24 hours.  If you had a scopolamine patch placed behind your ear for the management of post- operative nausea and/or vomiting:  1. The medication in the patch is effective for 72 hours, after which it should be removed.  Wrap patch in a tissue and discard in the trash. Wash hands thoroughly with soap and water. 2. You may remove the patch earlier than 72 hours if you experience unpleasant side effects which may include dry mouth, dizziness or visual disturbances. 3. Avoid touching the patch. Wash your hands with soap and water after contact with the patch.     

## 2021-10-18 NOTE — Transfer of Care (Signed)
Immediate Anesthesia Transfer of Care Note  Patient: [facility]  Procedure(s) Performed: Right CARPAL TUNNEL RELEASE (Right)  Patient Location: PACU  Anesthesia Type:MAC  Level of Consciousness: awake, alert  and oriented  Airway & Oxygen Therapy: Patient Spontanous Breathing and Patient connected to face mask oxygen  Post-op Assessment: Report given to RN and Post -op Vital signs reviewed and stable  Post vital signs: Reviewed and stable  Last Vitals:  Vitals Value Taken Time  BP    Temp    Pulse    Resp    SpO2      Last Pain:  Vitals:   10/18/21 0820  TempSrc: Oral  PainSc: 5       Patients Stated Pain Goal: 5 (32/35/57 3220)  Complications: No notable events documented.

## 2021-10-18 NOTE — Anesthesia Preprocedure Evaluation (Signed)
Anesthesia Evaluation  Patient identified by MRN, date of birth, ID band Patient awake    Reviewed: Allergy & Precautions, NPO status , Patient's Chart, lab work & pertinent test results  History of Anesthesia Complications (+) PONV  Airway Mallampati: II  TM Distance: >3 FB Neck ROM: Full    Dental no notable dental hx.    Pulmonary neg pulmonary ROS, Current Smoker and Patient abstained from smoking.,    Pulmonary exam normal breath sounds clear to auscultation       Cardiovascular negative cardio ROS Normal cardiovascular exam Rhythm:Regular Rate:Normal     Neuro/Psych Anxiety Depression negative neurological ROS  negative psych ROS   GI/Hepatic negative GI ROS, Neg liver ROS,   Endo/Other  negative endocrine ROSdiabetes, Type 2, Insulin Dependent  Renal/GU negative Renal ROS  negative genitourinary   Musculoskeletal negative musculoskeletal ROS (+)   Abdominal (+) + obese,   Peds negative pediatric ROS (+)  Hematology negative hematology ROS (+)   Anesthesia Other Findings   Reproductive/Obstetrics negative OB ROS                             Anesthesia Physical Anesthesia Plan  ASA: 3  Anesthesia Plan: MAC   Post-op Pain Management:    Induction: Intravenous  PONV Risk Score and Plan: 1 and Ondansetron and Treatment may vary due to age or medical condition  Airway Management Planned: Simple Face Mask  Additional Equipment:   Intra-op Plan:   Post-operative Plan:   Informed Consent: I have reviewed the patients History and Physical, chart, labs and discussed the procedure including the risks, benefits and alternatives for the proposed anesthesia with the patient or authorized representative who has indicated his/her understanding and acceptance.     Dental advisory given  Plan Discussed with: CRNA  Anesthesia Plan Comments:         Anesthesia Quick  Evaluation

## 2021-10-18 NOTE — Interval H&P Note (Signed)
History and Physical Interval Note:  10/18/2021 8:55 AM  [facility]  has presented today for surgery, with the diagnosis of Right carpal tunnel syndrome.  The various methods of treatment have been discussed with the patient and family. After consideration of risks, benefits and other options for treatment, the patient has consented to  Procedure(s): Right CARPAL TUNNEL RELEASE (Right) as a surgical intervention.  The patient's history has been reviewed, patient examined, no change in status, stable for surgery.  I have reviewed the patient's chart and labs.  Questions were answered to the patient's satisfaction.     Beatriz Settles Quina Wilbourne

## 2021-10-18 NOTE — Op Note (Signed)
° °  Date of Surgery: 10/18/2021  INDICATIONS: George Howe is a 36 y.o.-year-old male with right carpal tunnel syndrome that has failed conservative management.  Risks, benefits, and alternatives to surgery were again discussed with the patient wishing to proceed with surgery.  Informed consent was signed after our discussion.   PREOPERATIVE DIAGNOSIS: 1. Right carpal tunnel syndrome  POSTOPERATIVE DIAGNOSIS: Same.  PROCEDURE: 1. Right carpal tunnel release   SURGEON: Audria Nine, M.D.  ASSIST:   ANESTHESIA:  Local, MAC  IV FLUIDS AND URINE: See anesthesia.  ESTIMATED BLOOD LOSS: <5 mL.  IMPLANTS: * No implants in log *   DRAINS: None  COMPLICATIONS: see description of procedure.  DESCRIPTION OF PROCEDURE: The patient was met in the preoperative holding area where the surgical site was marked and the consent form was verified.  The patient was then taken to the operating room and transferred to the operating table.  All bony prominences were well padded.  A tourniquet was applied to the right forearm.  A formal time-out was performed to confirm that this was the correct patient, surgery, side, and site. Following timeout, a local block was performed using 10cc of 1% plain lidocaine. The operative extremity was prepped and draped in the usual and sterile fashion.    Following draping, a second timeout was performed.  Following timeout, the limb was exsanguinated and the tourniquet inflated to 250 mmHg.  A longitudinal incision was made in line with the radial border of the ring finger from distal to the wrist flexion crease to the intersection of Kaplan's cardinal line.  The skin and subcutaneous tissue was sharply divided.  The longitudinally running palmar fascia was incised.  The ligament was divided from proximal to distal until the fat surrounding the palmar arch was encountered.  A retractor was then placed in the proximal aspect of the wound to visualize the distal antebrachial  fascia.  The fascia was sharply divided under direct visualization.   The wound was then thoroughly irrigated with sterile saline.  The tourniquet was deflated.  Hemostasis was achieved with direct pressure and bipolar electrocautery.  The wound was then closed with 4-0 nylon sutures in a horizontal mattress fashion. The wound was then dressed with xeroform, folded kerlix, and an ace wrap.  The patient was then reversed from anesthesia and transferred to the postoperative bed.  All counts were correct at the end of the procedure.  The patient was taken to the recovery unit in stable condition.     POSTOPERATIVE PLAN: The patient will be discharged to home with appropriate pain medication and discharge instructions. I will see George Howe back in two weeks for his first postop visit.   Audria Nine, MD 11:41 AM

## 2021-10-18 NOTE — Brief Op Note (Signed)
10/18/2021  11:41 AM  PATIENT:  George Howe  36 y.o. male  PRE-OPERATIVE DIAGNOSIS:  Right carpal tunnel syndrome  POST-OPERATIVE DIAGNOSIS:  Right carpal tunnel syndrome  PROCEDURE:  Procedure(s): Right CARPAL TUNNEL RELEASE (Right)  SURGEON:  Surgeon(s) and Role:    * Sherilyn Cooter, MD - Primary  PHYSICIAN ASSISTANT:   ASSISTANTS: none   ANESTHESIA:   local and MAC  EBL:  5 mL   BLOOD ADMINISTERED:none  DRAINS: none   LOCAL MEDICATIONS USED:  LIDOCAINE   SPECIMEN:  No Specimen  DISPOSITION OF SPECIMEN:  N/A  COUNTS:  YES  TOURNIQUET:  * Missing tourniquet times found for documented tourniquets in log: [id number] *  DICTATION: .Dragon Dictation  PLAN OF CARE: Discharge to home after PACU  PATIENT DISPOSITION:  PACU - hemodynamically stable.   Delay start of Pharmacological VTE agent (>24hrs) due to surgical blood loss or risk of bleeding: not applicable

## 2021-10-18 NOTE — Anesthesia Postprocedure Evaluation (Signed)
Anesthesia Post Note  Patient: George Howe  Procedure(s) Performed: Right CARPAL TUNNEL RELEASE (Right)     Patient location during evaluation: PACU Anesthesia Type: MAC Level of consciousness: awake and alert Pain management: pain level controlled Vital Signs Assessment: post-procedure vital signs reviewed and stable Respiratory status: spontaneous breathing, nonlabored ventilation and respiratory function stable Cardiovascular status: blood pressure returned to baseline and stable Postop Assessment: no apparent nausea or vomiting Anesthetic complications: no   No notable events documented.  Last Vitals:  Vitals:   10/18/21 1200 10/18/21 1220  BP: (!) 147/87 117/90  Pulse: 74 66  Resp: 11 16  Temp:  36.5 C  SpO2: 95% 96%    Last Pain:  Vitals:   10/18/21 1220  TempSrc:   PainSc: 0-No pain                 Lynda Rainwater

## 2021-10-18 NOTE — H&P (Signed)
HPI: George Howe is a 36 y.o. male who presents with  numbness and tingling involving bilateral hands.  His right is more affected than his left.  He presents for right carpal tunnel release today.  He describes numbness and tingling in the thumb, index, middle, and part of his ring finger. His symptoms have been present for at least a year.  He has nearly nightly nocturnal symptoms.  He has failed conservative management.   Hand dominance: right Occupation: Chef  Past Medical History:  Diagnosis Date   Chicken pox    Depression    Diabetes mellitus without complication ([place])    DM (diabetes mellitus) ([place])    Hay fever    Hyperlipidemia    Hyperlipidemia    Morbid obesity ([facility])    PONV (postoperative nausea and vomiting)    History reviewed. No pertinent surgical history. Social History   Socioeconomic History   Marital status: Single    Spouse name: Not on file   Number of children: Not on file   Years of education: Not on file   Highest education level: Not on file  Occupational History   Not on file  Tobacco Use   Smoking status: Every Day    Packs/day: 1.00    Years: 17.00    Pack years: 17.00    Types: Cigarettes   Smokeless tobacco: Never  Vaping Use   Vaping Use: Never used  Substance and Sexual Activity   Alcohol use: Yes    Comment: occasional   Drug use: Yes    Types: Marijuana    Comment: last 10-17-21   Sexual activity: Yes  Other Topics Concern   Not on file  Social History Narrative   Not on file   Social Determinants of Health   Financial Resource Strain: Not on file  Food Insecurity: Not on file  Transportation Needs: Not on file  Physical Activity: Not on file  Stress: Not on file  Social Connections: Not on file   Family History  Problem Relation Age of Onset   Diabetes Father    Diabetes Paternal Grandmother    - negative except otherwise stated in the family history section No Known Allergies Prior to Admission medications    Medication Sig Start Date End Date Taking? Authorizing Provider  atorvastatin (LIPITOR) 40 MG tablet Take 1 tablet (40 mg total) by mouth daily. 10/11/21  Yes Isaac Bliss, Rayford Halsted, MD  cetirizine (ZYRTEC) 10 MG tablet Take 10 mg by mouth daily as needed for allergies.   Yes [provider]  ibuprofen (ADVIL) 200 MG tablet Take 800 mg by mouth every 6 (six) hours as needed for headache or moderate pain.   Yes [provider]  insulin aspart (NOVOLOG) 100 UNIT/ML FlexPen Inject 4 units with each meal. Additionally, add 3 units for each 40 points above 140. 02/15/21  Yes Jose Persia, MD  insulin glargine-yfgn (SEMGLEE) 100 UNIT/ML Pen Inject 33 Units into the skin daily. 10/11/21  Yes Isaac Bliss, Rayford Halsted, MD  Multiple Vitamins-Minerals (MULTIVITAMIN WITH MINERALS) tablet Take 1 tablet by mouth daily.   Yes [provider]  naproxen sodium (ALEVE) 220 MG tablet Take 440 mg by mouth daily as needed (pain).   Yes [provider]  sertraline (ZOLOFT) 100 MG tablet Take 1 tablet (100 mg total) by mouth daily. 08/08/21  Yes Isaac Bliss, Rayford Halsted, MD  blood glucose meter kit and supplies KIT 1 each. Check blood sugar before meals and at bedtime  [provider]  Continuous Blood Gluc Sensor (FREESTYLE LIBRE 3 SENSOR) MISC Place 1 sensor on the skin every 14 days. Use to check glucose continuously 07/26/21   Isaac Bliss, Rayford Halsted, MD  Insulin Pen Needle (PEN NEEDLES) 32G X 4 MM MISC Use 1 pen needle to inject insulin 4 times per day. 01/11/21   Jose Persia, MD   No results found. - pertinent xrays, CT, MRI studies were reviewed and independently interpreted  Positive ROS: All other systems have been reviewed and were otherwise negative with the exception of those mentioned in the HPI and as above.  Physical Exam: General: No acute distress, resting comfortably Cardiovascular: BUE warm and well perfused Respiratory: Normal WOB on  RA Skin: No lesions in the area of chief complaint Neurologic: Sensation intact distally Psychiatric: Patient is at baseline mood and affect  Right hand: Fingers warm and well perfused w/ BCR + Tinel, Phalen, and CT compression signs Full and painless ROM of fingers and wrist No skin lesions around surgical site    Assessment: 36 yo M w/ R CTS  Plan: OR today for carpal tunnel release Risks of surgery including bleeding, infection, damage to neurovascular structures, CRPS, incomplete symptom relief, and need for additional surgery were discussed Reviewed expected recovery process Will see back in the office in two weeks for first postop visit.   Sherilyn Cooter, M.D. [facility] 8:51 AM

## 2021-10-19 ENCOUNTER — Encounter (HOSPITAL_BASED_OUTPATIENT_CLINIC_OR_DEPARTMENT_OTHER): Payer: Self-pay | Admitting: Orthopedic Surgery

## 2021-10-30 ENCOUNTER — Encounter: Payer: Self-pay | Admitting: Orthopedic Surgery

## 2021-10-30 ENCOUNTER — Other Ambulatory Visit: Payer: Self-pay

## 2021-10-30 ENCOUNTER — Ambulatory Visit (INDEPENDENT_AMBULATORY_CARE_PROVIDER_SITE_OTHER): Payer: 59 | Admitting: Orthopedic Surgery

## 2021-10-30 ENCOUNTER — Encounter (HOSPITAL_COMMUNITY): Payer: Self-pay | Admitting: Orthopedic Surgery

## 2021-10-30 DIAGNOSIS — G5601 Carpal tunnel syndrome, right upper limb: Secondary | ICD-10-CM

## 2021-10-30 NOTE — Pre-Procedure Instructions (Signed)
[facility] [address] [place] [address] Phone: [phone] Fax: [phone]   PCP - Chaya Jan, MD Cardiologist - Denies   Chest x-ray - 05/02/21 EKG - 10/17/21 Stress Test - Denies ECHO - Denies Cardiac Cath - Denies   Fasting Blood Sugar - 120-140 Checks Blood Sugar 4/day  ERAS Protcol - Clears until Noon  COVID TEST- N/A Ambulatory Surgery  Anesthesia review: N  Patient verbally denies any shortness of breath, fever, cough and chest pain during phone call   -------------  SDW INSTRUCTIONS given:  Your procedure is scheduled on 11/01/21.  Report to [facility] "A" at 1215 P.M., and check in at the Admitting office.  Call this number if you have problems the morning of surgery:  [phone]   Remember:  Do not eat after midnight the night before your surgery  You may drink clear liquids until Noon the day of your surgery.   Clear liquids allowed are: Water, Non-Citrus Juices (without pulp), Carbonated Beverages, Clear Tea, Black Coffee Only, and Gatorade    Take these medicines the morning of surgery with A SIP OF WATER  Zoloft Zyrtec-if needed  .** PLEASE check your blood sugar the morning of your surgery when you wake up and every 2 hours until you get to the Short Stay unit.  If your blood sugar is less than 70 mg/dL, you will need to treat for low blood sugar: Do not take insulin. Treat a low blood sugar (less than 70 mg/dL) with  cup of clear juice (cranberry or apple), 4 glucose tablets, OR glucose gel. Recheck blood sugar in 15 minutes after treatment (to make sure it is greater than 70 mg/dL). If your blood sugar is not greater than 70 mg/dL on recheck, call [phone] for further instructions.   As of today, STOP taking any Aspirin (unless otherwise instructed by your surgeon) Aleve, Naproxen, Ibuprofen, Motrin, Advil, Goody's, BC's, all herbal medications, fish oil, and all  vitamins.                      Do not wear jewelry, make up, or nail polish            Do not wear lotions, powders, perfumes/colognes, or deodorant.            Do not shave 48 hours prior to surgery.  Men may shave face and neck.            Do not bring valuables to the hospital.            [facility] is not responsible for any belongings or valuables.  Do NOT Smoke (Tobacco/Vaping) 24 hours prior to your procedure If you use a CPAP at night, you may bring all equipment for your overnight stay.   Contacts, glasses, dentures or bridgework may not be worn into surgery.      For patients admitted to the hospital, discharge time will be determined by your treatment team.   Patients discharged the day of surgery will not be allowed to drive home, and someone needs to stay with them for 24 hours.    Special instructions:   [facility]- Preparing For Surgery  Before surgery, you can play an important role. Because skin is not sterile, your skin needs to be as free of germs as possible. You can reduce the number of germs on your skin by washing with CHG (chlorahexidine gluconate) Soap before  surgery.  CHG is an antiseptic cleaner which kills germs and bonds with the skin to continue killing germs even after washing.    Oral Hygiene is also important to reduce your risk of infection.  Remember - BRUSH YOUR TEETH THE MORNING OF SURGERY WITH YOUR REGULAR TOOTHPASTE  Please do not use if you have an allergy to CHG or antibacterial soaps. If your skin becomes reddened/irritated stop using the CHG.  Do not shave (including legs and underarms) for at least 48 hours prior to first CHG shower. It is OK to shave your face.  Please follow these instructions carefully.   Shower the NIGHT BEFORE SURGERY and the MORNING OF SURGERY with DIAL Soap.   Pat yourself dry with a CLEAN TOWEL.  Wear CLEAN PAJAMAS to bed the night before surgery  Place CLEAN SHEETS on your bed the night of your first shower  and DO NOT SLEEP WITH PETS.   Day of Surgery: Please shower morning of surgery  Wear Clean/Comfortable clothing the morning of surgery Do not apply any deodorants/lotions.   Remember to brush your teeth WITH YOUR REGULAR TOOTHPASTE.   Questions were answered. Patient verbalized understanding of instructions.

## 2021-10-30 NOTE — Progress Notes (Signed)
° °  Post-Op Visit Note   Patient: George Howe           Date of Birth: 31-May-1986           MRN: [id number] Visit Date: 10/30/2021 PCP: Isaac Bliss, Rayford Halsted, MD   Assessment & Plan:  Chief Complaint:  Chief Complaint  Patient presents with   Right Wrist - Follow-up    Right carpal tunnel release 10/18/21   Visit Diagnoses:  1. Carpal tunnel syndrome, right upper limb     Plan: Patient is now approximately two weeks out from right open CTR.  He is doing well postoperatively.  His nocturnal symptoms have resolved.  He is able to open bottles with this hand again which is new since surgery.  He is happy with his progress so far.  His is scheduled for CTR on the left side on Wednesday.   Follow-Up Instructions: No follow-ups on file.   Orders:  No orders of the defined types were placed in this encounter.  No orders of the defined types were placed in this encounter.   Imaging: No results found.  PMFS History: Patient Active Problem List   Diagnosis Date Noted   Carpal tunnel syndrome, right upper limb    Bilateral carpal tunnel syndrome 08/07/2021   GAD (generalized anxiety disorder) 05/01/2021   MDD (major depressive disorder), recurrent severe, without psychosis ([place]) 04/30/2021   Hyperlipidemia 03/08/2021   Morbid obesity ([place]) 03/08/2021   Diabetes mellitus ([place]) 01/19/2021   Hypertriglyceridemia    Past Medical History:  Diagnosis Date   Chicken pox    Depression    Diabetes mellitus without complication ([place])    DM (diabetes mellitus) ([place])    Hay fever    Hyperlipidemia    Hyperlipidemia    Morbid obesity ([place])    PONV (postoperative nausea and vomiting)     Family History  Problem Relation Age of Onset   Diabetes Father    Diabetes Paternal Grandmother     Past Surgical History:  Procedure Laterality Date   CARPAL TUNNEL RELEASE Right 10/18/2021   Procedure: Right CARPAL TUNNEL RELEASE;  Surgeon: Sherilyn Cooter, MD;  Location: [facility];  Service: Orthopedics;  Laterality: Right;   Social History   Occupational History   Not on file  Tobacco Use   Smoking status: Every Day    Packs/day: 1.00    Years: 17.00    Pack years: 17.00    Types: Cigarettes   Smokeless tobacco: Never  Vaping Use   Vaping Use: Never used  Substance and Sexual Activity   Alcohol use: Yes    Comment: occasional   Drug use: Yes    Types: Marijuana    Comment: last 10-17-21   Sexual activity: Yes

## 2021-11-01 ENCOUNTER — Ambulatory Visit (HOSPITAL_COMMUNITY): Payer: 59 | Admitting: Anesthesiology

## 2021-11-01 ENCOUNTER — Encounter (HOSPITAL_COMMUNITY): Payer: Self-pay | Admitting: Orthopedic Surgery

## 2021-11-01 ENCOUNTER — Ambulatory Visit (HOSPITAL_COMMUNITY)
Admission: RE | Admit: 2021-11-01 | Discharge: 2021-11-01 | Disposition: A | Discharge status: Home / Self Care | Payer: 59 | Attending: Orthopedic Surgery | Admitting: Orthopedic Surgery

## 2021-11-01 ENCOUNTER — Other Ambulatory Visit: Payer: Self-pay

## 2021-11-01 ENCOUNTER — Encounter (HOSPITAL_COMMUNITY)
Admission: RE | Disposition: A | Discharge status: Home / Self Care | Payer: Self-pay | Source: Home / Self Care | Attending: Orthopedic Surgery

## 2021-11-01 ENCOUNTER — Other Ambulatory Visit (HOSPITAL_COMMUNITY): Payer: Self-pay

## 2021-11-01 DIAGNOSIS — Z794 Long term (current) use of insulin: Secondary | ICD-10-CM | POA: Diagnosis not present

## 2021-11-01 DIAGNOSIS — G5602 Carpal tunnel syndrome, left upper limb: Secondary | ICD-10-CM | POA: Diagnosis not present

## 2021-11-01 DIAGNOSIS — F419 Anxiety disorder, unspecified: Secondary | ICD-10-CM | POA: Diagnosis not present

## 2021-11-01 DIAGNOSIS — G5603 Carpal tunnel syndrome, bilateral upper limbs: Secondary | ICD-10-CM | POA: Insufficient documentation

## 2021-11-01 DIAGNOSIS — F32A Depression, unspecified: Secondary | ICD-10-CM | POA: Insufficient documentation

## 2021-11-01 DIAGNOSIS — E119 Type 2 diabetes mellitus without complications: Secondary | ICD-10-CM | POA: Insufficient documentation

## 2021-11-01 HISTORY — PX: CARPAL TUNNEL RELEASE: SHX101

## 2021-11-01 LAB — GLUCOSE, CAPILLARY
Glucose-Capillary: 169 mg/dL — ABNORMAL HIGH (ref 70–99)
Glucose-Capillary: 138 mg/dL — ABNORMAL HIGH (ref 70–99)

## 2021-11-01 SURGERY — CARPAL TUNNEL RELEASE
Anesthesia: Monitor Anesthesia Care | Laterality: Left

## 2021-11-01 MED ORDER — 0.9 % SODIUM CHLORIDE (POUR BTL) OPTIME
TOPICAL | Status: DC | PRN
  Administered 2021-11-01: 1000 mL

## 2021-11-01 MED ORDER — ACETAMINOPHEN 160 MG/5ML PO SOLN: 1000.0000 mg | Freq: Once | ORAL | Status: DC | PRN

## 2021-11-01 MED ORDER — BUPIVACAINE HCL (PF) 0.25 % IJ SOLN
INTRAMUSCULAR | Status: AC
  Filled 2021-11-01: qty 30

## 2021-11-01 MED ORDER — OXYCODONE HCL 5 MG PO TABS
5.0000 mg | ORAL_TABLET | ORAL | 0 refills | Status: AC | PRN
Start: 2021-11-01 — End: 2021-11-04
  Filled 2021-11-01: qty 18, 3d supply, fill #0

## 2021-11-01 MED ORDER — LACTATED RINGERS IV SOLN: INTRAVENOUS | Status: DC

## 2021-11-01 MED ORDER — ORAL CARE MOUTH RINSE: 15.0000 mL | Freq: Once | OROMUCOSAL | Status: AC

## 2021-11-01 MED ORDER — LIDOCAINE HCL 1 % IJ SOLN
INTRAMUSCULAR | Status: AC
  Filled 2021-11-01: qty 20

## 2021-11-01 MED ORDER — PROPOFOL 10 MG/ML IV BOLUS
INTRAVENOUS | Status: AC
  Filled 2021-11-01: qty 20

## 2021-11-01 MED ORDER — FENTANYL CITRATE (PF) 100 MCG/2ML IJ SOLN
25.0000 ug | INTRAMUSCULAR | Status: DC | PRN
  Administered 2021-11-01: 25 ug via INTRAVENOUS
  Administered 2021-11-01: 50 ug via INTRAVENOUS

## 2021-11-01 MED ORDER — PROPOFOL 10 MG/ML IV BOLUS
INTRAVENOUS | Status: DC | PRN
  Administered 2021-11-01: 40 mg via INTRAVENOUS
  Administered 2021-11-01: 30 mg via INTRAVENOUS

## 2021-11-01 MED ORDER — BUPIVACAINE HCL (PF) 0.25 % IJ SOLN
INTRAMUSCULAR | Status: DC | PRN
  Administered 2021-11-01: 5 mL

## 2021-11-01 MED ORDER — PHENYLEPHRINE 40 MCG/ML (10ML) SYRINGE FOR IV PUSH (FOR BLOOD PRESSURE SUPPORT)
PREFILLED_SYRINGE | INTRAVENOUS | Status: DC | PRN
  Administered 2021-11-01 (×5): 80 ug via INTRAVENOUS

## 2021-11-01 MED ORDER — FENTANYL CITRATE (PF) 250 MCG/5ML IJ SOLN
INTRAMUSCULAR | Status: AC
  Filled 2021-11-01: qty 5

## 2021-11-01 MED ORDER — MIDAZOLAM HCL 2 MG/2ML IJ SOLN
INTRAMUSCULAR | Status: AC
  Filled 2021-11-01: qty 2

## 2021-11-01 MED ORDER — FENTANYL CITRATE (PF) 250 MCG/5ML IJ SOLN
INTRAMUSCULAR | Status: DC | PRN
  Administered 2021-11-01: 50 ug via INTRAVENOUS
  Administered 2021-11-01: 25 ug via INTRAVENOUS

## 2021-11-01 MED ORDER — ACETAMINOPHEN 10 MG/ML IV SOLN
INTRAVENOUS | Status: AC
  Filled 2021-11-01: qty 100

## 2021-11-01 MED ORDER — ONDANSETRON HCL 4 MG/2ML IJ SOLN
INTRAMUSCULAR | Status: DC | PRN
Start: 2021-11-01 — End: 2021-11-01
  Administered 2021-11-01: 4 mg via INTRAVENOUS

## 2021-11-01 MED ORDER — LIDOCAINE 2% (20 MG/ML) 5 ML SYRINGE
INTRAMUSCULAR | Status: DC | PRN
  Administered 2021-11-01: 40 mg via INTRAVENOUS

## 2021-11-01 MED ORDER — CHLORHEXIDINE GLUCONATE 0.12 % MT SOLN
15.0000 mL | Freq: Once | OROMUCOSAL | Status: AC
  Administered 2021-11-01: 15 mL via OROMUCOSAL
  Filled 2021-11-01: qty 15

## 2021-11-01 MED ORDER — ACETAMINOPHEN 500 MG PO TABS: 1000.0000 mg | ORAL_TABLET | Freq: Once | ORAL | Status: DC | PRN

## 2021-11-01 MED ORDER — FENTANYL CITRATE (PF) 100 MCG/2ML IJ SOLN
INTRAMUSCULAR | Status: AC
  Filled 2021-11-01: qty 2

## 2021-11-01 MED ORDER — MIDAZOLAM HCL 2 MG/2ML IJ SOLN
INTRAMUSCULAR | Status: DC | PRN
  Administered 2021-11-01: 2 mg via INTRAVENOUS

## 2021-11-01 MED ORDER — ACETAMINOPHEN 10 MG/ML IV SOLN
1000.0000 mg | Freq: Once | INTRAVENOUS | Status: DC | PRN
  Administered 2021-11-01: 1000 mg via INTRAVENOUS

## 2021-11-01 MED ORDER — PROPOFOL 500 MG/50ML IV EMUL
INTRAVENOUS | Status: DC | PRN
  Administered 2021-11-01: 100 ug/kg/min via INTRAVENOUS

## 2021-11-01 MED ORDER — OXYCODONE HCL 5 MG PO TABS: 5.0000 mg | ORAL_TABLET | Freq: Once | ORAL | Status: DC | PRN

## 2021-11-01 MED ORDER — OXYCODONE HCL 5 MG/5ML PO SOLN: 5.0000 mg | Freq: Once | ORAL | Status: DC | PRN

## 2021-11-01 MED ORDER — LIDOCAINE HCL 1 % IJ SOLN
INTRAMUSCULAR | Status: DC | PRN
  Administered 2021-11-01: 5 mL

## 2021-11-01 SURGICAL SUPPLY — 35 items
BAG COUNTER SPONGE SURGICOUNT (BAG) ×2 IMPLANT
BNDG ELASTIC 4X5.8 VLCR STR LF (GAUZE/BANDAGES/DRESSINGS) ×3 IMPLANT
BNDG GAUZE ELAST 4 BULKY (GAUZE/BANDAGES/DRESSINGS) ×2 IMPLANT
CORD BIPOLAR FORCEPS 12FT (ELECTRODE) ×2 IMPLANT
COVER SURGICAL LIGHT HANDLE (MISCELLANEOUS) ×2 IMPLANT
CUFF TOURN SGL QUICK 18X4 (TOURNIQUET CUFF) ×2 IMPLANT
CUFF TOURN SGL QUICK 24 (TOURNIQUET CUFF)
CUFF TRNQT CYL 24X4X16.5-23 (TOURNIQUET CUFF) IMPLANT
DRAPE U-SHAPE 47X51 STRL (DRAPES) ×2 IMPLANT
DRSG XEROFORM 1X8 (GAUZE/BANDAGES/DRESSINGS) ×2 IMPLANT
GAUZE 4X4 16PLY ~~LOC~~+RFID DBL (SPONGE) ×1 IMPLANT
GAUZE SPONGE 4X4 12PLY STRL (GAUZE/BANDAGES/DRESSINGS) ×2 IMPLANT
GAUZE XEROFORM 1X8 LF (GAUZE/BANDAGES/DRESSINGS) ×1 IMPLANT
GLOVE SURG ENC TEXT LTX SZ7 (GLOVE) ×2 IMPLANT
GOWN STRL REUS W/ TWL LRG LVL3 (GOWN DISPOSABLE) ×2 IMPLANT
GOWN STRL REUS W/TWL LRG LVL3 (GOWN DISPOSABLE) ×2
KIT BASIN OR (CUSTOM PROCEDURE TRAY) ×2 IMPLANT
KIT TURNOVER KIT B (KITS) ×2 IMPLANT
LOOP VESSEL MAXI BLUE (MISCELLANEOUS) ×1 IMPLANT
NDL HYPO 25GX1X1/2 BEV (NEEDLE) IMPLANT
NEEDLE HYPO 25GX1X1/2 BEV (NEEDLE) IMPLANT
NS IRRIG 1000ML POUR BTL (IV SOLUTION) ×2 IMPLANT
PACK ORTHO EXTREMITY (CUSTOM PROCEDURE TRAY) ×2 IMPLANT
PAD ARMBOARD 7.5X6 YLW CONV (MISCELLANEOUS) ×4 IMPLANT
PAD CAST 4YDX4 CTTN HI CHSV (CAST SUPPLIES) ×3 IMPLANT
PADDING CAST COTTON 4X4 STRL (CAST SUPPLIES) ×1
SUT ETHIBOND 3-0 V-5 (SUTURE) ×1 IMPLANT
SUT ETHILON 4 0 PS 2 18 (SUTURE) ×2 IMPLANT
SUT MNCRL AB 3-0 PS2 18 (SUTURE) IMPLANT
SYR CONTROL 10ML LL (SYRINGE) IMPLANT
TOWEL GREEN STERILE (TOWEL DISPOSABLE) ×2 IMPLANT
TOWEL GREEN STERILE FF (TOWEL DISPOSABLE) ×2 IMPLANT
TUBE CONNECTING 12X1/4 (SUCTIONS) ×2 IMPLANT
UNDERPAD 30X36 HEAVY ABSORB (UNDERPADS AND DIAPERS) ×2 IMPLANT
WATER STERILE IRR 1000ML POUR (IV SOLUTION) ×2 IMPLANT

## 2021-11-01 NOTE — H&P (Signed)
° ° °HPI: °George Howe is a 35 y.o. male who presents with  numbness and tingling involving bilateral hands.  His right is more affected than his left.  He is now status post right carpal tunnel release. He presents for left carpal tunnel release today.  He describes numbness and tingling in the thumb, index, middle, and part of his ring finger. His symptoms have been present for at least a year.  He has nearly nightly nocturnal symptoms.  He has failed conservative management.  ° ° ° °Past Medical History:  °Diagnosis Date  ° Chicken pox   ° Depression   ° Diabetes mellitus without complication ([facility])   ° DM (diabetes mellitus) ([facility])   ° Hay fever   ° Hyperlipidemia   ° Hyperlipidemia   ° Morbid obesity ([facility])   ° PONV (postoperative nausea and vomiting)   ° °Past Surgical History:  °Procedure Laterality Date  ° CARPAL TUNNEL RELEASE Right 10/18/2021  ° Procedure: Right CARPAL TUNNEL RELEASE;  Surgeon: Yzabella Crunk, Brieanna Nau, MD;  Location: [facility];  Service: Orthopedics;  Laterality: Right;  ° °Social History  ° °Socioeconomic History  ° Marital status: Single  °  Spouse name: Not on file  ° Number of children: Not on file  ° Years of education: Not on file  ° Highest education level: Not on file  °Occupational History  ° Not on file  °Tobacco Use  ° Smoking status: Every Day  °  Packs/day: 1.00  °  Years: 17.00  °  Pack years: 17.00  °  Types: Cigarettes  ° Smokeless tobacco: Never  °Vaping Use  ° Vaping Use: Never used  °Substance and Sexual Activity  ° Alcohol use: Yes  °  Comment: occasional  ° Drug use: Yes  °  Types: Marijuana  °  Comment: last 10-17-21  ° Sexual activity: Yes  °Other Topics Concern  ° Not on file  °Social History Narrative  ° Not on file  ° °Social Determinants of Health  ° °Financial Resource Strain: Not on file  °Food Insecurity: Not on file  °Transportation Needs: Not on file  °Physical Activity: Not on file  °Stress: Not on file  °Social Connections: Not on file  ° °Family  History  °Problem Relation Age of Onset  ° Diabetes Father   ° Diabetes Paternal Grandmother   ° °- negative except otherwise stated in the family history section °No Known Allergies °Prior to Admission medications   °Medication Sig Start Date End Date Taking? Authorizing Provider  °atorvastatin (LIPITOR) 40 MG tablet Take 1 tablet (40 mg total) by mouth daily. °Patient taking differently: Take 40 mg by mouth every evening. 10/11/21  Yes Hernandez Acosta, Estela Y, MD  °cetirizine (ZYRTEC) 10 MG tablet Take 10 mg by mouth daily as needed for allergies.   Yes [provider]  °ibuprofen (ADVIL) 200 MG tablet Take 400 mg by mouth every 8 (eight) hours as needed for headache or moderate pain.   Yes [provider]  °insulin aspart (NOVOLOG) 100 UNIT/ML FlexPen Inject 4 units with each meal. Additionally, add 3 units for each 40 points above 140. 02/15/21  Yes Basaraba, Iulia, MD  °insulin glargine-yfgn (SEMGLEE) 100 UNIT/ML Pen Inject 33 Units into the skin daily. °Patient taking differently: Inject 33 Units into the skin at bedtime. 10/11/21  Yes Hernandez Acosta, Estela Y, MD  °Multiple Vitamins-Minerals (MULTIVITAMIN WITH MINERALS) tablet Take 1 tablet by mouth in the morning.   Yes [provider]  °  naproxen sodium (ALEVE) 220 MG tablet Take 440 mg by mouth daily as needed (pain).   Yes [provider]  °sertraline (ZOLOFT) 100 MG tablet Take 1 tablet (100 mg total) by mouth daily. 08/08/21  Yes Hernandez Acosta, Estela Y, MD  °blood glucose meter kit and supplies KIT 1 each. Check blood sugar before meals and at bedtime    [provider]  °Continuous Blood Gluc Sensor (FREESTYLE LIBRE 3 SENSOR) MISC Place 1 sensor on the skin every 14 days. Use to check glucose continuously 07/26/21   Hernandez Acosta, Estela Y, MD  °Insulin Pen Needle (PEN NEEDLES) 32G X 4 MM MISC Use 1 pen needle to inject insulin 4 times per day. 01/11/21   Basaraba, Iulia, MD  ° °No results found. °-  pertinent xrays, CT, MRI studies were reviewed and independently interpreted ° °Positive ROS: All other systems have been reviewed and were otherwise negative with the exception of those mentioned in the HPI and as above. ° °Physical Exam: °General: No acute distress, resting comfortably °Cardiovascular: No pedal edema °Respiratory: No cyanosis, no use of accessory musculature °Skin: No lesions in the area of chief complaint °Neurologic: Sensation intact distally °Psychiatric: Patient is at baseline mood and affect ° °Left Hand: °+ Tinel, Phalen signs °5/5 thenar motor strength °Hand warm and well perfused with BCR in all fingers ° ° ° °Assessment: °35 yo M w/ bilateral CTS s/p R CTR who presents today for L CTR. ° °Plan: °OR today for left carpal tunnel release °Risks, benefits, and alternatives to surgery again discussed °Informed consent signed °Additional plan to follow in discharge instructions.  ° ° °Ramelo Oetken Ostin Mathey, M.D. °[facility] °1:41 PM ° ° ° ° °

## 2021-11-01 NOTE — H&P (View-Only) (Signed)
HPI: George Howe is a 36 y.o. male who presents with  numbness and tingling involving bilateral hands.  His right is more affected than his left.  He is now status post right carpal tunnel release. He presents for left carpal tunnel release today.  He describes numbness and tingling in the thumb, index, middle, and part of his ring finger. His symptoms have been present for at least a year.  He has nearly nightly nocturnal symptoms.  He has failed conservative management.     Past Medical History:  Diagnosis Date   Chicken pox    Depression    Diabetes mellitus without complication ([place])    DM (diabetes mellitus) ([place])    Hay fever    Hyperlipidemia    Hyperlipidemia    Morbid obesity ([place])    PONV (postoperative nausea and vomiting)    Past Surgical History:  Procedure Laterality Date   CARPAL TUNNEL RELEASE Right 10/18/2021   Procedure: Right CARPAL TUNNEL RELEASE;  Surgeon: Sherilyn Cooter, MD;  Location: [facility];  Service: Orthopedics;  Laterality: Right;   Social History   Socioeconomic History   Marital status: Single    Spouse name: Not on file   Number of children: Not on file   Years of education: Not on file   Highest education level: Not on file  Occupational History   Not on file  Tobacco Use   Smoking status: Every Day    Packs/day: 1.00    Years: 17.00    Pack years: 17.00    Types: Cigarettes   Smokeless tobacco: Never  Vaping Use   Vaping Use: Never used  Substance and Sexual Activity   Alcohol use: Yes    Comment: occasional   Drug use: Yes    Types: Marijuana    Comment: last 10-17-21   Sexual activity: Yes  Other Topics Concern   Not on file  Social History Narrative   Not on file   Social Determinants of Health   Financial Resource Strain: Not on file  Food Insecurity: Not on file  Transportation Needs: Not on file  Physical Activity: Not on file  Stress: Not on file  Social Connections: Not on file   Family  History  Problem Relation Age of Onset   Diabetes Father    Diabetes Paternal Grandmother    - negative except otherwise stated in the family history section No Known Allergies Prior to Admission medications   Medication Sig Start Date End Date Taking? Authorizing Provider  atorvastatin (LIPITOR) 40 MG tablet Take 1 tablet (40 mg total) by mouth daily. Patient taking differently: Take 40 mg by mouth every evening. 10/11/21  Yes Isaac Bliss, Rayford Halsted, MD  cetirizine (ZYRTEC) 10 MG tablet Take 10 mg by mouth daily as needed for allergies.   Yes [provider]  ibuprofen (ADVIL) 200 MG tablet Take 400 mg by mouth every 8 (eight) hours as needed for headache or moderate pain.   Yes [provider]  insulin aspart (NOVOLOG) 100 UNIT/ML FlexPen Inject 4 units with each meal. Additionally, add 3 units for each 40 points above 140. 02/15/21  Yes Jose Persia, MD  insulin glargine-yfgn (SEMGLEE) 100 UNIT/ML Pen Inject 33 Units into the skin daily. Patient taking differently: Inject 33 Units into the skin at bedtime. 10/11/21  Yes Isaac Bliss, Rayford Halsted, MD  Multiple Vitamins-Minerals (MULTIVITAMIN WITH MINERALS) tablet Take 1 tablet by mouth in the morning.   Yes [provider]  naproxen sodium (ALEVE) 220 MG tablet Take 440 mg by mouth daily as needed (pain).   Yes [provider]  sertraline (ZOLOFT) 100 MG tablet Take 1 tablet (100 mg total) by mouth daily. 08/08/21  Yes Isaac Bliss, Rayford Halsted, MD  blood glucose meter kit and supplies KIT 1 each. Check blood sugar before meals and at bedtime    [provider]  Continuous Blood Gluc Sensor (FREESTYLE LIBRE 3 SENSOR) MISC Place 1 sensor on the skin every 14 days. Use to check glucose continuously 07/26/21   Isaac Bliss, Rayford Halsted, MD  Insulin Pen Needle (PEN NEEDLES) 32G X 4 MM MISC Use 1 pen needle to inject insulin 4 times per day. 01/11/21   Jose Persia, MD   No results found. -  pertinent xrays, CT, MRI studies were reviewed and independently interpreted  Positive ROS: All other systems have been reviewed and were otherwise negative with the exception of those mentioned in the HPI and as above.  Physical Exam: General: No acute distress, resting comfortably Cardiovascular: No pedal edema Respiratory: No cyanosis, no use of accessory musculature Skin: No lesions in the area of chief complaint Neurologic: Sensation intact distally Psychiatric: Patient is at baseline mood and affect  Left Hand: + Tinel, Phalen signs 5/5 thenar motor strength Hand warm and well perfused with BCR in all fingers    Assessment: 36 yo M w/ bilateral CTS s/p R CTR who presents today for L CTR.  Plan: OR today for left carpal tunnel release Risks, benefits, and alternatives to surgery again discussed Informed consent signed Additional plan to follow in discharge instructions.    Sherilyn Cooter, M.D. [facility] 1:41 PM

## 2021-11-01 NOTE — Op Note (Signed)
° °  Date of Surgery: 11/01/2021  INDICATIONS: George Howe is a 36 y.o.-year-old male with left carpal tunnel syndrome that has failed conservative management.  Risks, benefits, and alternatives to surgery were again discussed with the patient wishing to proceed with surgery.  Informed consent was signed after our discussion.   PREOPERATIVE DIAGNOSIS:  Left carpal tunnel syndrome  POSTOPERATIVE DIAGNOSIS: Same.  PROCEDURE:  Left carpal tunnel release   SURGEON: Audria Nine, M.D.  ASSIST:   ANESTHESIA:  Local, MAC  IV FLUIDS AND URINE: See anesthesia.  ESTIMATED BLOOD LOSS: 5 mL.  IMPLANTS: * No implants in log *   DRAINS: None  COMPLICATIONS: see description of procedure.  DESCRIPTION OF PROCEDURE: The patient was met in the preoperative holding area where the surgical site was marked and the consent form was verified.  The patient was then taken to the operating room and transferred to the operating table.  All bony prominences were well padded.  A tourniquet was applied to the left forearm.  A formal time-out was performed to confirm that this was the correct patient, surgery, side, and site.   Following timeout, a digital block was performed using 0.25% marcaine and 1% lidocaine, both without epinephrine. The operative extremity was prepped and draped in the usual and sterile fashion.    Following timeout, the limb was exsanguinated and the tourniquet inflated to 250 mmHg.  A longitudinal incision was made in line with the radial border of the ring finger from distal to the wrist flexion crease to the intersection of Kaplan's cardinal line.  The skin and subcutaneous tissue was sharply divided.  The longitudinally running palmar fascia was incised.  The thenar musculature was bluntly swept off of the transverse carpal ligament.  The ligament was divided from proximal to distal until the fat surrounding the palmar arch was encountered.  A retractor was then placed in the proximal  aspect of the wound to visualize the distal antebrachial fascia.  The fascia was sharply divided under direct visualization.   The wound was then thoroughly irrigated with sterile saline.  The tourniquet was deflated.  Hemostasis was achieved with direct pressure and bipolar electrocautery.  The wound was then closed with 4-0 nylon sutures in a horizontal mattress fashion. The wound was then dressed with xeroform, folded kerlix, and an ace wrap.  The patient was then reversed from anesthesia and transferred to the postoperative bed.  All counts were correct x 2 at the end of the procedure.  The patient was taken to the recovery unit in stable condition.    POSTOPERATIVE PLAN: He will be discharged to home with appropriate pain medication and discharge instructions.  I'll see him in 10-14 days for his first postop visit.   Audria Nine, MD 2:42 PM

## 2021-11-01 NOTE — Progress Notes (Signed)
Orthopedic Tech Progress Note Patient Details:  George Howe 11/03/1985 [id number]  Ortho Devices Type of Ortho Device: Arm sling Ortho Device/Splint Location: LUE Ortho Device/Splint Interventions: Application   Post Interventions Patient Tolerated: Well  [name] 11/01/2021, 3:01 PM

## 2021-11-01 NOTE — Brief Op Note (Signed)
11/01/2021  2:41 PM  PATIENT:  George Howe  36 y.o. male  PRE-OPERATIVE DIAGNOSIS:  Left carpal tunnel syndrome  POST-OPERATIVE DIAGNOSIS:  Left carpal tunnel syndrome  PROCEDURE:  Procedure(s): Left CARPAL TUNNEL RELEASE (Left)  SURGEON:  Surgeon(s) and Role:    * Sherilyn Cooter, MD - Primary  PHYSICIAN ASSISTANT:   ASSISTANTS: none   ANESTHESIA:   local and MAC  EBL:  5 cc   BLOOD ADMINISTERED:none  DRAINS: none   LOCAL MEDICATIONS USED:  MARCAINE    and LIDOCAINE   SPECIMEN:  No Specimen  DISPOSITION OF SPECIMEN:  N/A  COUNTS:  YES  TOURNIQUET:   Total Tourniquet Time Documented: Forearm (Left) - 15 minutes Total: Forearm (Left) - 15 minutes   DICTATION: .Dragon Dictation  PLAN OF CARE: Discharge to home after PACU  PATIENT DISPOSITION:  PACU - hemodynamically stable.   Delay start of Pharmacological VTE agent (>24hrs) due to surgical blood loss or risk of bleeding: not applicable

## 2021-11-01 NOTE — Interval H&P Note (Signed)
History and Physical Interval Note:  11/01/2021 1:45 PM  George Howe  has presented today for surgery, with the diagnosis of Left carpal tunnel syndrome.  The various methods of treatment have been discussed with the patient and family. After consideration of risks, benefits and other options for treatment, the patient has consented to  Procedure(s): Left CARPAL TUNNEL RELEASE (Left) as a surgical intervention.  The patient's history has been reviewed, patient examined, no change in status, stable for surgery.  I have reviewed the patient's chart and labs.  Questions were answered to the patient's satisfaction.     Rushil Kimbrell Meril Dray

## 2021-11-01 NOTE — Transfer of Care (Signed)
Immediate Anesthesia Transfer of Care Note  Patient: George Howe  Procedure(s) Performed: Left CARPAL TUNNEL RELEASE (Left)  Patient Location: PACU  Anesthesia Type:MAC  Level of Consciousness: awake  Airway & Oxygen Therapy: Patient Spontanous Breathing  Post-op Assessment: Report given to RN and Post -op Vital signs reviewed and stable  Post vital signs: Reviewed and stable  Last Vitals:  Vitals Value Taken Time  BP 122/93 11/01/21 1440  Temp    Pulse 88 11/01/21 1440  Resp 17 11/01/21 1440  SpO2 95 % 11/01/21 1440  Vitals shown include unvalidated device data.  Last Pain: There were no vitals filed for this visit.       Complications: No notable events documented.

## 2021-11-01 NOTE — Anesthesia Preprocedure Evaluation (Signed)
Anesthesia Evaluation  Patient identified by MRN, date of birth, ID band Patient awake    Reviewed: Allergy & Precautions, NPO status , Patient's Chart, lab work & pertinent test results  History of Anesthesia Complications (+) PONV and history of anesthetic complications  Airway Mallampati: II  TM Distance: >3 FB Neck ROM: Full    Dental  (+) Dental Advisory Given, Teeth Intact   Pulmonary neg shortness of breath, neg COPD, neg recent URI, Current SmokerPatient did not abstain from smoking.,    breath sounds clear to auscultation       Cardiovascular negative cardio ROS   Rhythm:Regular     Neuro/Psych PSYCHIATRIC DISORDERS Anxiety Depression    GI/Hepatic negative GI ROS, Neg liver ROS,   Endo/Other  diabetes, Insulin Dependent  Renal/GU negative Renal ROSLab Results      Component                Value               Date                      CREATININE               0.96                10/17/2021                Musculoskeletal Left carpal tunnel syndrome   Abdominal   Peds  Hematology negative hematology ROS (+) Lab Results      Component                Value               Date                      WBC                      17.4 (H)            07/11/2021                HGB                      17.5 (H)            07/11/2021                HCT                      47.5                07/11/2021                MCV                      83.3                07/11/2021                PLT                      273                 07/11/2021              Anesthesia Other Findings   Reproductive/Obstetrics  Anesthesia Physical Anesthesia Plan  ASA: 2  Anesthesia Plan: MAC   Post-op Pain Management: Minimal or no pain anticipated   Induction: Intravenous  PONV Risk Score and Plan: 1 and Propofol infusion and Treatment may vary due to age or medical  condition  Airway Management Planned: Nasal Cannula  Additional Equipment: None  Intra-op Plan:   Post-operative Plan:   Informed Consent: I have reviewed the patients History and Physical, chart, labs and discussed the procedure including the risks, benefits and alternatives for the proposed anesthesia with the patient or authorized representative who has indicated his/her understanding and acceptance.     Dental advisory given  Plan Discussed with: CRNA and Anesthesiologist  Anesthesia Plan Comments:         Anesthesia Quick Evaluation

## 2021-11-01 NOTE — Discharge Instructions (Signed)
Waylan Rocher, M.D. Hand Surgery  POST-OPERATIVE DISCHARGE INSTRUCTIONS   PRESCRIPTIONS: - You have been given a prescription to be taken as directed for post-operative pain control.  You may also take over the counter ibuprofen/aleve and tylenol for pain. Take this as directed on the packaging. Do not exceed 3000 mg tylenol/acetaminophen in 24 hours.  Ibuprofen 600-800 mg (3-4) tablets by mouth every 6 hours as needed for pain.   OR  Aleve 2 tablets by mouth every 12 hours (twice daily) as needed for pain.   AND/OR  Tylenol 1000 mg (2 tablets) every 8 hours as needed for pain.  - Please use your pain medication carefully, as refills are limited and you may not be provided with one.  As stated above, please use over the counter pain medicine - it will also be helpful with decreasing your swelling.    ANESTHESIA: -After your surgery, post-surgical discomfort or pain is likely. This discomfort can last several days to a few weeks. At certain times of the day your discomfort may be more intense.   Did you receive a nerve block?   - A nerve block can provide pain relief for one hour to two days after your surgery. As long as the nerve block is working, you will experience little or no sensation in the area the surgeon operated on.  - As the nerve block wears off, you will begin to experience pain or discomfort. It is very important that you begin taking your prescribed pain medication before the nerve block fully wears off. Treating your pain at the first sign of the block wearing off will ensure your pain is better controlled and more tolerable when full-sensation returns. Do not wait until the pain is intolerable, as the medicine will be less effective. It is better to treat pain in advance than to try and catch up.   General Anesthesia:  If you did not receive a nerve block during your surgery, you will need to start taking your pain medication shortly after your surgery and  should continue to do so as prescribed by your surgeon.     ICE AND ELEVATION: - Motion of your fingers is very important to decrease the swelling.  - Elevation, as much as possible for the next 48 hours, is critical for decreasing swelling as well as for pain relief. Elevation means when you are seated or lying down, you hand should be at or above your heart. When walking, the hand needs to be at or above the level of your elbow.  - If the bandage gets too tight, it may need to be loosened. Please contact our office and we will instruct you in how to do this.    SURGICAL BANDAGES:  - Keep your dressing and/or splint clean and dry at all times.  You can remove your dressing 4 days from now and change with a dry dressing or Band-Aids as needed thereafter. - You may place a plastic bag over your bandage to shower, but be careful, do not get your bandages wet.  - After the bandages have been removed, it is OK to get the stitches wet in a shower or with hand washing. Do Not soak or submerge the wound yet. Please do not use lotions or creams on the stitches.      HAND THERAPY:  - You may not need any. If you do, we will begin this at your follow up visit in the clinic.    ACTIVITY AND  WORK: - You are encouraged to move any fingers which are not in the bandage.  - Light use of the fingers is allowed to assist the other hand with daily hygiene and eating, but strong gripping or lifting is often uncomfortable and should be avoided.  - You might miss a variable period of time from work and hopefully this issue has been discussed prior to surgery. You may not do any heavy work with your affected hand for about 2 weeks.    [facility] [address] [place],  [place]  [address] [phone]

## 2021-11-02 ENCOUNTER — Encounter (HOSPITAL_COMMUNITY): Payer: Self-pay | Admitting: Orthopedic Surgery

## 2021-11-06 NOTE — Anesthesia Postprocedure Evaluation (Signed)
Anesthesia Post Note  Patient: George Howe  Procedure(s) Performed: Left CARPAL TUNNEL RELEASE (Left)     Patient location during evaluation: PACU Anesthesia Type: MAC Level of consciousness: awake and alert Pain management: pain level controlled Vital Signs Assessment: post-procedure vital signs reviewed and stable Respiratory status: spontaneous breathing, nonlabored ventilation, respiratory function stable and patient connected to nasal cannula oxygen Cardiovascular status: stable and blood pressure returned to baseline Postop Assessment: no apparent nausea or vomiting Anesthetic complications: no   No notable events documented.  Last Vitals:  Vitals:   11/01/21 1510 11/01/21 1525  BP: 132/83 (!) 142/89  Pulse: 72 77  Resp: 17 17  Temp:  36.8 C  SpO2: 93% 92%    Last Pain:  Vitals:   11/01/21 1525  PainSc: 3                  Latoyia Tecson

## 2021-11-10 ENCOUNTER — Other Ambulatory Visit: Payer: Self-pay

## 2021-11-10 ENCOUNTER — Ambulatory Visit (INDEPENDENT_AMBULATORY_CARE_PROVIDER_SITE_OTHER): Payer: 59 | Admitting: Orthopedic Surgery

## 2021-11-10 DIAGNOSIS — G5602 Carpal tunnel syndrome, left upper limb: Secondary | ICD-10-CM

## 2021-11-10 NOTE — Progress Notes (Signed)
° °  Post-Op Visit Note   Patient: George Howe           Date of Birth: 1986/08/18           MRN: [id number] Visit Date: 11/10/2021 PCP: Isaac Bliss, Rayford Halsted, MD   Assessment & Plan:  Chief Complaint:  Chief Complaint  Patient presents with   Left Hand - Routine Post Op   Visit Diagnoses:  1. Carpal tunnel syndrome, left upper limb     Plan: Patient is doing well postoperatively.  No complaints today.  His incision is clean and dry.  His nocturnal symptoms and numbness/paresthesias have resolved.  He is very happy with his result.  His sutures were removed.  We discussed utility of scar massage.  He can follow up again as needed.   Follow-Up Instructions: No follow-ups on file.   Orders:  No orders of the defined types were placed in this encounter.  No orders of the defined types were placed in this encounter.   Imaging: No results found.  PMFS History: Patient Active Problem List   Diagnosis Date Noted   Carpal tunnel syndrome, left upper limb    Carpal tunnel syndrome, right upper limb    Bilateral carpal tunnel syndrome 08/07/2021   GAD (generalized anxiety disorder) 05/01/2021   MDD (major depressive disorder), recurrent severe, without psychosis ([place]) 04/30/2021   Hyperlipidemia 03/08/2021   Morbid obesity ([place]) 03/08/2021   Diabetes mellitus ([place]) 01/19/2021   Hypertriglyceridemia    Past Medical History:  Diagnosis Date   Chicken pox    Depression    Diabetes mellitus without complication ([place])    DM (diabetes mellitus) ([place])    Hay fever    Hyperlipidemia    Hyperlipidemia    Morbid obesity ([place])    PONV (postoperative nausea and vomiting)     Family History  Problem Relation Age of Onset   Diabetes Father    Diabetes Paternal Grandmother     Past Surgical History:  Procedure Laterality Date   CARPAL TUNNEL RELEASE Right 10/18/2021   Procedure: Right CARPAL TUNNEL RELEASE;  Surgeon: Sherilyn Cooter, MD;  Location: [facility];  Service: Orthopedics;  Laterality: Right;   CARPAL TUNNEL RELEASE Left 11/01/2021   Procedure: Left CARPAL TUNNEL RELEASE;  Surgeon: Sherilyn Cooter, MD;  Location: [facility];  Service: Orthopedics;  Laterality: Left;   Social History   Occupational History   Not on file  Tobacco Use   Smoking status: Every Day    Packs/day: 1.00    Years: 17.00    Pack years: 17.00    Types: Cigarettes   Smokeless tobacco: Never  Vaping Use   Vaping Use: Never used  Substance and Sexual Activity   Alcohol use: Yes    Comment: occasional   Drug use: Yes    Types: Marijuana    Comment: last 10-17-21   Sexual activity: Yes

## 2021-11-17 ENCOUNTER — Other Ambulatory Visit (HOSPITAL_COMMUNITY): Payer: Self-pay

## 2021-11-17 ENCOUNTER — Other Ambulatory Visit: Payer: Self-pay | Admitting: Internal Medicine

## 2021-11-20 ENCOUNTER — Other Ambulatory Visit (HOSPITAL_COMMUNITY): Payer: Self-pay

## 2021-11-21 ENCOUNTER — Other Ambulatory Visit (HOSPITAL_COMMUNITY): Payer: Self-pay

## 2021-11-21 MED ORDER — SERTRALINE HCL 100 MG PO TABS
100.0000 mg | ORAL_TABLET | Freq: Every day | ORAL | 0 refills | Status: DC
Start: 2021-11-21 — End: 2022-02-25
  Filled 2021-11-21: qty 90, 90d supply, fill #0

## 2021-12-18 ENCOUNTER — Other Ambulatory Visit (HOSPITAL_COMMUNITY): Payer: Self-pay

## 2021-12-18 ENCOUNTER — Other Ambulatory Visit: Payer: Self-pay | Admitting: Internal Medicine

## 2021-12-19 ENCOUNTER — Other Ambulatory Visit (HOSPITAL_COMMUNITY): Payer: Self-pay

## 2021-12-19 MED ORDER — CETIRIZINE HCL 10 MG PO TABS
10.0000 mg | ORAL_TABLET | Freq: Every day | ORAL | 0 refills | Status: AC | PRN
  Filled 2021-12-19: qty 90, 90d supply, fill #0

## 2022-02-25 ENCOUNTER — Other Ambulatory Visit: Payer: Self-pay | Admitting: Internal Medicine

## 2022-02-26 ENCOUNTER — Other Ambulatory Visit (HOSPITAL_COMMUNITY): Payer: Self-pay

## 2022-02-26 MED ORDER — SERTRALINE HCL 100 MG PO TABS
100.0000 mg | ORAL_TABLET | Freq: Every day | ORAL | 0 refills | Status: DC
  Filled 2022-02-26: qty 90, 90d supply, fill #0

## 2022-04-25 IMAGING — CR DG CHEST 2V
2 series · 2 of 2 positions shown · non-contrast
Comparison: None.

CLINICAL DATA: COVID positive.

EXAM:
CHEST - 2 VIEW

[w chest pa]
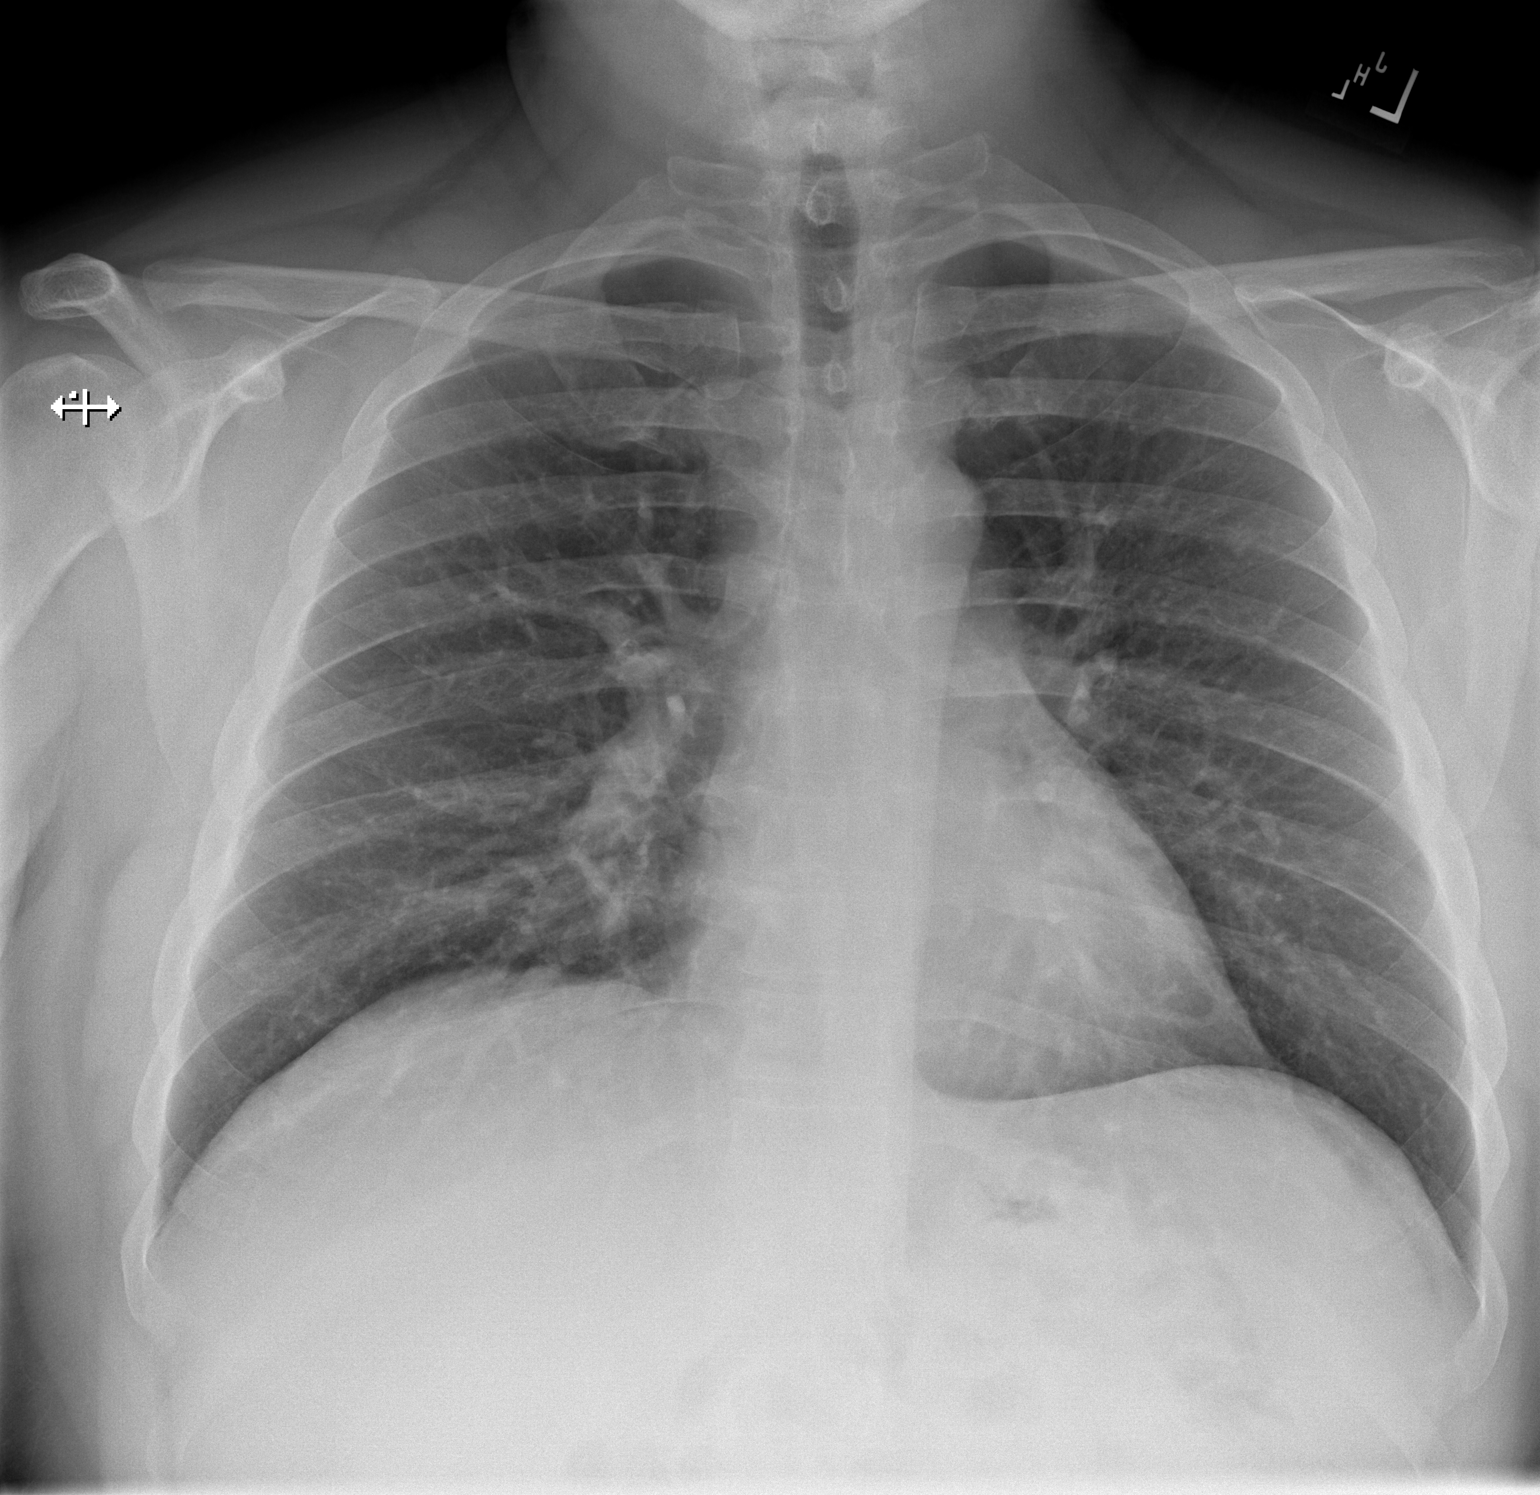

[w chest lat]
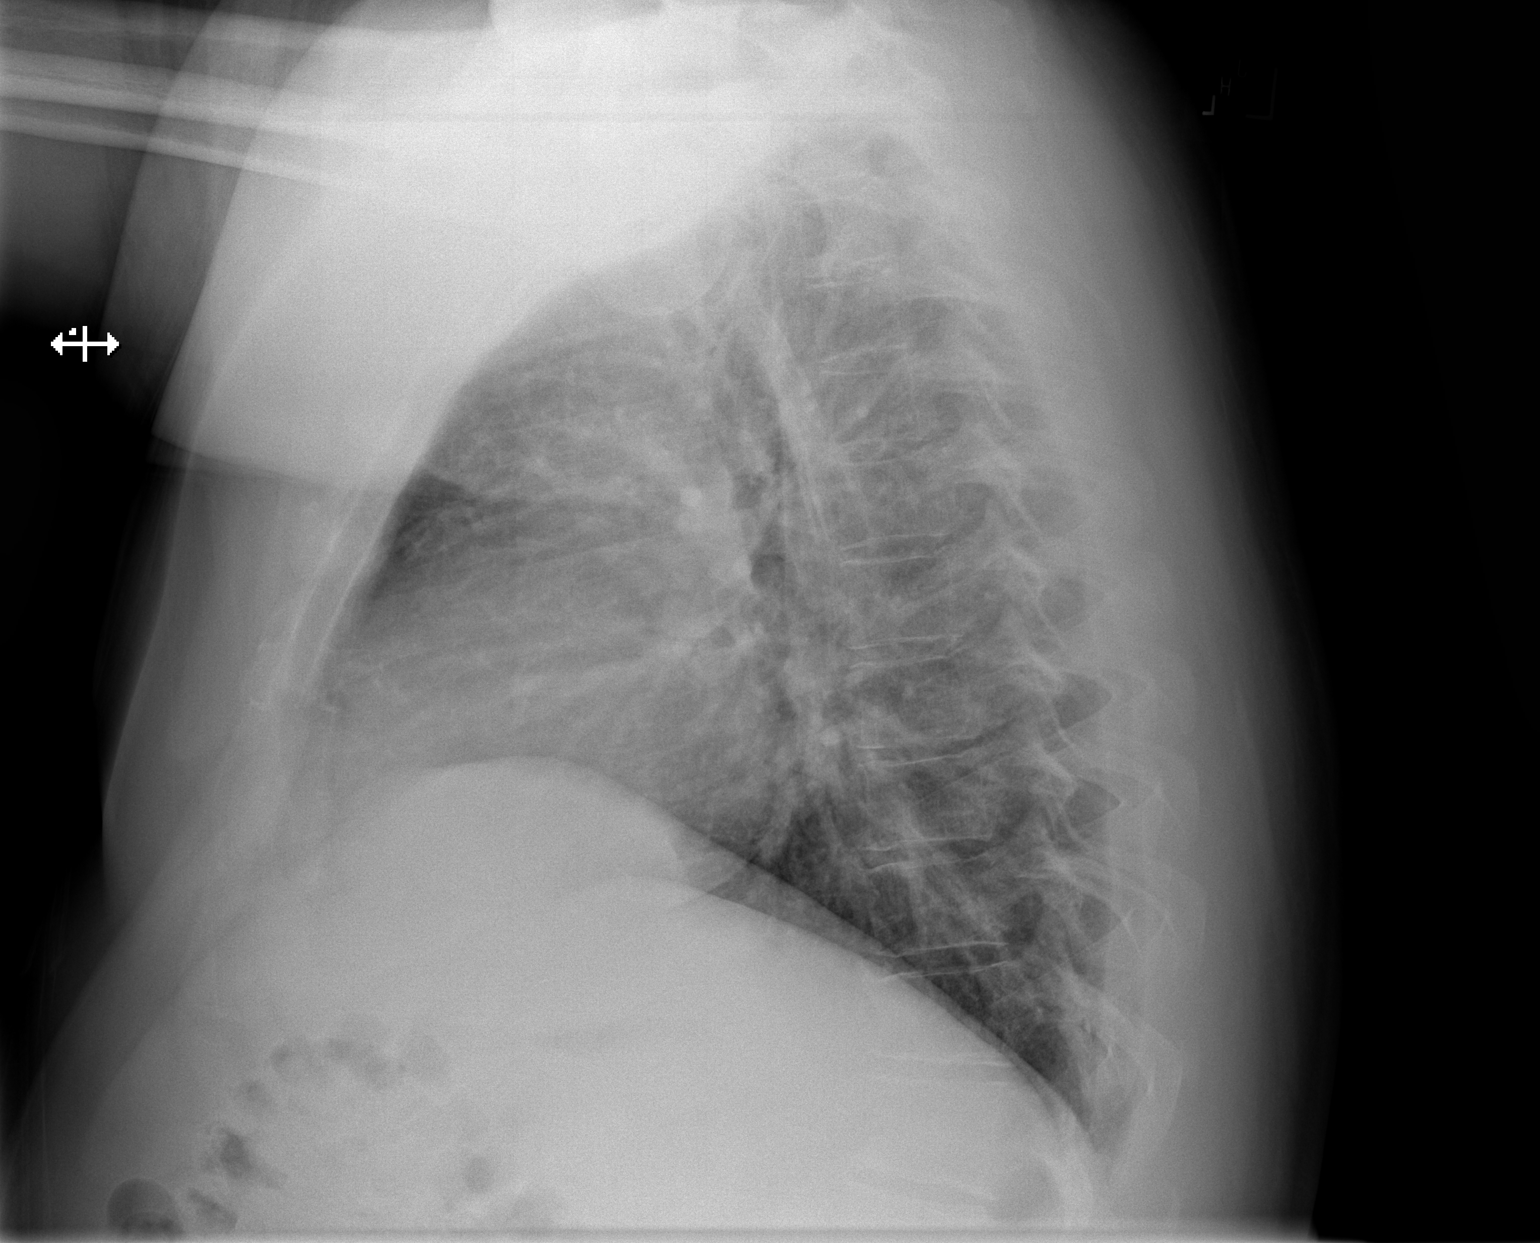

[2 of 2 positions shown; findings below may reference images not displayed]

FINDINGS: The heart size and mediastinal contours are within normal limits.
Both lungs are clear. The visualized skeletal structures are
unremarkable.
IMPRESSION: No active cardiopulmonary disease.

## 2022-04-30 ENCOUNTER — Emergency Department (HOSPITAL_COMMUNITY): Payer: Self-pay

## 2022-04-30 ENCOUNTER — Encounter (HOSPITAL_COMMUNITY): Payer: Self-pay | Admitting: Emergency Medicine

## 2022-04-30 ENCOUNTER — Emergency Department (HOSPITAL_COMMUNITY)
Admission: EM | Admit: 2022-04-30 | Discharge: 2022-04-30 | Disposition: A | Discharge status: Home / Self Care | Payer: Self-pay | Attending: Emergency Medicine | Admitting: Emergency Medicine

## 2022-04-30 DIAGNOSIS — K859 Acute pancreatitis without necrosis or infection, unspecified: Secondary | ICD-10-CM | POA: Insufficient documentation

## 2022-04-30 LAB — COMPREHENSIVE METABOLIC PANEL
ALT: 30 U/L (ref 0–44)
AST: 35 U/L (ref 15–41)
Albumin: 4.3 g/dL (ref 3.5–5.0)
Alkaline Phosphatase: 80 U/L (ref 38–126)
Anion gap: 12 (ref 5–15)
BUN: 13 mg/dL (ref 6–20)
CO2: 21 mmol/L — ABNORMAL LOW (ref 22–32)
Calcium: 9.4 mg/dL (ref 8.9–10.3)
Chloride: 99 mmol/L (ref 98–111)
Creatinine, Ser: 0.93 mg/dL (ref 0.61–1.24)
GFR, Estimated: >60 mL/min (ref >60)
Glucose, Bld: 326 mg/dL — ABNORMAL HIGH (ref 70–99)
Potassium: 4.4 mmol/L (ref 3.5–5.1)
Sodium: 132 mmol/L — ABNORMAL LOW (ref 135–145)
Total Bilirubin: 1.9 mg/dL — ABNORMAL HIGH (ref 0.3–1.2)
Total Protein: 6.9 g/dL (ref 6.5–8.1)

## 2022-04-30 LAB — URINALYSIS, ROUTINE W REFLEX MICROSCOPIC
Bacteria, UA: NONE SEEN
Bilirubin Urine: NEGATIVE
Glucose, UA: >=500 mg/dL — AB
Hgb urine dipstick: NEGATIVE
Ketones, ur: 5 mg/dL — AB
Leukocytes,Ua: NEGATIVE
Nitrite: NEGATIVE
Protein, ur: 30 mg/dL — AB
Specific Gravity, Urine: 1.033 — ABNORMAL HIGH (ref 1.005–1.030)
pH: 6 (ref 5.0–8.0)

## 2022-04-30 LAB — CBC
HCT: 46.6 % (ref 39.0–52.0)
Hemoglobin: 17 g/dL (ref 13.0–17.0)
MCH: 30 pg (ref 26.0–34.0)
MCHC: 36.5 g/dL — ABNORMAL HIGH (ref 30.0–36.0)
MCV: 82.2 fL (ref 80.0–100.0)
Platelets: 297 10*3/uL (ref 150–400)
RBC: 5.67 MIL/uL (ref 4.22–5.81)
RDW: 12.8 % (ref 11.5–15.5)
WBC: 21.4 10*3/uL — ABNORMAL HIGH (ref 4.0–10.5)
nRBC: 0 % (ref 0.0–0.2)

## 2022-04-30 LAB — BETA-HYDROXYBUTYRIC ACID: Beta-Hydroxybutyric Acid: 0.27 mmol/L (ref 0.05–0.27)

## 2022-04-30 LAB — LIPASE, BLOOD: Lipase: 207 U/L — ABNORMAL HIGH (ref 11–51)

## 2022-04-30 LAB — CBG MONITORING, ED: Glucose-Capillary: 328 mg/dL — ABNORMAL HIGH (ref 70–99)

## 2022-04-30 MED ORDER — HYDROMORPHONE HCL 1 MG/ML IJ SOLN
1.0000 mg | Freq: Once | INTRAMUSCULAR | Status: AC
  Administered 2022-04-30: 1 mg via INTRAVENOUS
  Filled 2022-04-30: qty 1

## 2022-04-30 MED ORDER — SODIUM CHLORIDE 0.9 % IV BOLUS
1000.0000 mL | Freq: Once | INTRAVENOUS | Status: AC
  Administered 2022-04-30: 1000 mL via INTRAVENOUS

## 2022-04-30 MED ORDER — IOHEXOL 300 MG/ML  SOLN
100.0000 mL | Freq: Once | INTRAMUSCULAR | Status: AC | PRN
  Administered 2022-04-30: 100 mL via INTRAVENOUS

## 2022-04-30 MED ORDER — ONDANSETRON HCL 4 MG/2ML IJ SOLN
4.0000 mg | Freq: Once | INTRAMUSCULAR | Status: AC
  Administered 2022-04-30: 4 mg via INTRAVENOUS
  Filled 2022-04-30: qty 2

## 2022-04-30 MED ORDER — ONDANSETRON 4 MG PO TBDP
4.0000 mg | ORAL_TABLET | Freq: Three times a day (TID) | ORAL | 0 refills | Status: DC | PRN
Start: 2022-04-30 — End: 2022-10-01
  Filled 2022-04-30: qty 8, 3d supply, fill #0

## 2022-04-30 MED ORDER — HYDROMORPHONE HCL 2 MG PO TABS
2.0000 mg | ORAL_TABLET | Freq: Once | ORAL | Status: AC
  Administered 2022-04-30: 2 mg via ORAL
  Filled 2022-04-30: qty 1

## 2022-04-30 MED ORDER — OXYCODONE-ACETAMINOPHEN 5-325 MG PO TABS
1.0000 | ORAL_TABLET | Freq: Once | ORAL | Status: DC
  Filled 2022-04-30 (×2): qty 1

## 2022-04-30 MED ORDER — MORPHINE SULFATE (PF) 4 MG/ML IV SOLN: 4.0000 mg | Freq: Once | INTRAVENOUS | Status: DC

## 2022-04-30 MED ORDER — MORPHINE SULFATE (PF) 4 MG/ML IV SOLN
4.0000 mg | Freq: Once | INTRAVENOUS | Status: AC
  Administered 2022-04-30: 4 mg via INTRAVENOUS
  Filled 2022-04-30: qty 1

## 2022-04-30 MED ORDER — HYDROMORPHONE HCL 2 MG PO TABS
2.0000 mg | ORAL_TABLET | ORAL | 0 refills | Status: DC | PRN
Start: 2022-04-30 — End: 2022-04-30
  Filled 2022-04-30: qty 8, 2d supply, fill #0

## 2022-04-30 MED ORDER — HYDROMORPHONE HCL 2 MG PO TABS
2.0000 mg | ORAL_TABLET | Freq: Four times a day (QID) | ORAL | 0 refills | Status: DC | PRN
  Filled 2022-04-30: qty 8, 2d supply, fill #0

## 2022-04-30 MED ORDER — SODIUM CHLORIDE (PF) 0.9 % IJ SOLN
INTRAMUSCULAR | Status: AC
  Filled 2022-04-30: qty 50

## 2022-04-30 NOTE — ED Provider Notes (Signed)
  Physical Exam  BP (!) 140/108   Pulse (!) 103   Temp 98.3 F (36.8 C) (Oral)   Resp (!) 27   Ht 5\' 3"  (1.6 m)   Wt 98 kg   SpO2 98%   BMI 38.26 kg/m   Physical Exam  Procedures  Procedures  ED Course / MDM    Medical Decision Making Amount and/or Complexity of Data Reviewed Labs: ordered. Radiology: ordered.  Risk Prescription drug management.   Received patient in signout.  Abdominal pain nausea vomiting.  History of pancreatitis.  Did have a leukocytosis.  CT scan done to evaluate and showed just pancreatitis.  Feeling somewhat better after treatment.  Has been given antiemetics and pain medicines.  Oral Dilaudid given because he states other oral medicine did not work.  Has tolerated orals are stable for discharge home.       , MD 04/30/22 [phone]

## 2022-04-30 NOTE — ED Provider Notes (Signed)
[facility] DEPT Provider Note   CSN: [id number] Arrival date & time: 04/30/22  1241     History  Chief Complaint  Patient presents with   Abdominal Pain   Emesis    George Howe is a 36 y.o. male.  Patient is a 36 year old male who has a prior history of pancreatitis who presents with upper abdominal pain.  He said it started during the night.  Its in his right upper quadrant.  It is consistent with his prior episodes of pancreatitis.  He has some nausea and vomiting associate with that.  No change in stools.  No fevers.  He states he has had his gallbladder evaluated in the past and has not been felt to be an etiology for his pancreatitis.  He says he drinks occasionally but not daily or heavily.  On chart review, he was diagnosed with pancreatitis the same time he was diagnosed with diabetes.  It was felt that his pancreatitis is related to elevated triglycerides.       Home Medications Prior to Admission medications   Medication Sig Start Date End Date Taking? Authorizing Provider  atorvastatin (LIPITOR) 40 MG tablet Take 1 tablet (40 mg total) by mouth daily. Patient taking differently: Take 40 mg by mouth every evening. 10/11/21   Isaac Bliss, Rayford Halsted, MD  blood glucose meter kit and supplies KIT 1 each. Check blood sugar before meals and at bedtime    [provider]  cetirizine (ZYRTEC) 10 MG tablet Take 1 tablet (10 mg total) by mouth daily as needed for allergies. 12/19/21   Isaac Bliss, Rayford Halsted, MD  Continuous Blood Gluc Sensor (FREESTYLE LIBRE 3 SENSOR) MISC Place 1 sensor on the skin every 14 days. Use to check glucose continuously 07/26/21   Isaac Bliss, Rayford Halsted, MD  ibuprofen (ADVIL) 200 MG tablet Take 400 mg by mouth every 8 (eight) hours as needed for headache or moderate pain.    [provider]  insulin aspart (NOVOLOG) 100 UNIT/ML FlexPen Inject 4 units with each meal. Additionally, add 3 units for  each 40 points above 140. 02/15/21   Jose Persia, MD  insulin glargine-yfgn (SEMGLEE) 100 UNIT/ML Pen Inject 33 Units into the skin daily. Patient taking differently: Inject 33 Units into the skin at bedtime. 10/11/21   Isaac Bliss, Rayford Halsted, MD  Insulin Pen Needle (PEN NEEDLES) 32G X 4 MM MISC Use 1 pen needle to inject insulin 4 times per day. 01/11/21   Jose Persia, MD  Multiple Vitamins-Minerals (MULTIVITAMIN WITH MINERALS) tablet Take 1 tablet by mouth in the morning.    [provider]  naproxen sodium (ALEVE) 220 MG tablet Take 440 mg by mouth daily as needed (pain).    [provider]  sertraline (ZOLOFT) 100 MG tablet Take 1 tablet (100 mg total) by mouth daily. 02/26/22   Isaac Bliss, Rayford Halsted, MD      Allergies    Patient has no known allergies.    Review of Systems   Review of Systems  Constitutional:  Negative for chills, diaphoresis, fatigue and fever.  HENT:  Negative for congestion, rhinorrhea and sneezing.   Eyes: Negative.   Respiratory:  Negative for cough, chest tightness and shortness of breath.   Cardiovascular:  Negative for chest pain and leg swelling.  Gastrointestinal:  Positive for abdominal pain, nausea and vomiting. Negative for blood in stool and diarrhea.  Genitourinary:  Negative for difficulty urinating, flank pain, frequency and hematuria.  Musculoskeletal:  Negative for arthralgias and back pain.  Skin:  Negative for rash.  Neurological:  Negative for dizziness, speech difficulty, weakness, numbness and headaches.    Physical Exam Updated Vital Signs BP (!) 156/94   Pulse 81   Temp 98.2 F (36.8 C) (Oral)   Resp (!) 21   Ht 5' 3"  (1.6 m)   Wt 98 kg   SpO2 99%   BMI 38.26 kg/m  Physical Exam Constitutional:      Appearance: He is well-developed.  HENT:     Head: Normocephalic and atraumatic.  Eyes:     Pupils: Pupils are equal, round, and reactive to light.  Cardiovascular:     Rate and Rhythm: Normal rate  and regular rhythm.     Heart sounds: Normal heart sounds.  Pulmonary:     Effort: Pulmonary effort is normal. No respiratory distress.     Breath sounds: Normal breath sounds. No wheezing or rales.  Chest:     Chest wall: No tenderness.  Abdominal:     General: Bowel sounds are normal.     Palpations: Abdomen is soft.     Tenderness: There is abdominal tenderness in the right upper quadrant and epigastric area. There is no guarding or rebound.  Musculoskeletal:        General: Normal range of motion.     Cervical back: Normal range of motion and neck supple.  Lymphadenopathy:     Cervical: No cervical adenopathy.  Skin:    General: Skin is warm and dry.     Findings: No rash.  Neurological:     Mental Status: He is alert and oriented to person, place, and time.     ED Results / Procedures / Treatments   Labs (all labs ordered are listed, but only abnormal results are displayed) Labs Reviewed  LIPASE, BLOOD - Abnormal; Notable for the following components:      Result Value   Lipase 207 (*)    All other components within normal limits  COMPREHENSIVE METABOLIC PANEL - Abnormal; Notable for the following components:   Sodium 132 (*)    CO2 21 (*)    Glucose, Bld 326 (*)    Total Bilirubin 1.9 (*)    All other components within normal limits  CBC - Abnormal; Notable for the following components:   WBC 21.4 (*)    MCHC 36.5 (*)    All other components within normal limits  CBG MONITORING, ED - Abnormal; Notable for the following components:   Glucose-Capillary 328 (*)    All other components within normal limits  BETA-HYDROXYBUTYRIC ACID  URINALYSIS, ROUTINE W REFLEX MICROSCOPIC    EKG None  Radiology US Abdomen Limited RUQ (LIVER/GB)  Result Date: 04/30/2022 CLINICAL DATA:  Right upper quadrant pain. EXAM: ULTRASOUND ABDOMEN LIMITED RIGHT UPPER QUADRANT COMPARISON:  CT abdomen pelvis January 07, 2021 FINDINGS: Gallbladder: There is a 4 mm gallbladder polyp. No  cholelithiasis. No gallbladder wall thickening. Negative sonographic Murphy's sign. Common bile duct: Diameter: 2 mm Liver: Increased echogenicity. No focal lesion. Portal vein is patent on color Doppler imaging with normal direction of blood flow towards the liver. Other: None. IMPRESSION: No cholelithiasis or sonographic evidence for acute cholecystitis. Increased hepatic parenchymal echogenicity suggestive of steatosis. 4 mm gallbladder polyp. Electronically Signed   By: Lovey Newcomer M.D.   On: 04/30/2022 14:31    Procedures Procedures    Medications Ordered in ED Medications  sodium chloride 0.9 % bolus 1,000 mL (0 mLs Intravenous Stopped  04/30/22 1509)  ondansetron (ZOFRAN) injection 4 mg (4 mg Intravenous Given 04/30/22 1410)  morphine (PF) 4 MG/ML injection 4 mg (4 mg Intravenous Given 04/30/22 1410)    ED Course/ Medical Decision Making/ A&P                           Medical Decision Making Amount and/or Complexity of Data Reviewed Labs: ordered. Radiology: ordered.  Risk Prescription drug management.   Patient is a 36 year old male who presents with upper abdominal pain consistent with prior episodes of pancreatitis.  His lipase is elevated.  His glucose is elevated but there is no suggestions of DKA.  He was given IV fluids.  He was given pain medication and antiemetics.  Gallbladder ultrasound shows no evidence of cholecystitis.  His white count is markedly elevated at 21,000.  CT scan is pending.  Patient care turned over to Dr. Alvino Chapel pending reevaluation following the scan.  Final Clinical Impression(s) / ED Diagnoses Final diagnoses:  Acute pancreatitis, unspecified complication status, unspecified pancreatitis type    Rx / DC Orders ED Discharge Orders     None         Malvin Johns, MD 04/30/22 1513

## 2022-04-30 NOTE — ED Triage Notes (Signed)
Pt endorses right side abd pain and emesis x3. Pt is diabetic, CBG was 416 and he took 10 units before coming. Also having right upper dental pain.

## 2022-04-30 NOTE — ED Provider Triage Note (Signed)
Emergency Medicine Provider Triage Evaluation Note  George Howe , a 36 y.o. male  was evaluated in triage.  Pt complains of RUQ abdominal pain that started yesterday. Pain constant and feels like prior pancreatitis. He started having n/v today. Also noticed his blood sugars are in the 400s this morning. No change in DM management. No recent illness. No other changes that could attribute to elevated sugars. No hx of gallbladder issues, no fevers or chills, no shortness of breath or chest pain. No constipation or diarrhea  Review of Systems  Positive: Abdominal pain, nausea, vomiting Negative:   Physical Exam  BP (!) 151/109   Pulse 97   Temp 98.7 F (37.1 C) (Oral)   Resp 16   Ht 5\' 3"  (1.6 m)   Wt 98 kg   SpO2 98%   BMI 38.26 kg/m  Gen:   Awake, no distress   Resp:  Normal effort  MSK:   Moves extremities without difficulty  Other:  RUQ abdominal pain. Murphy +.   Medical Decision Making  Medically screening exam initiated at 1:22 PM.  Appropriate orders placed.  Aero Drummonds was informed that the remainder of the evaluation will be completed by another provider, this initial triage assessment does not replace that evaluation, and the importance of remaining in the ED until their evaluation is complete.  Ordered RUQ [name] and abdominal labs. Added on BHB to evaluate for hyperglycemia.    [place], PA-C 04/30/22 1324

## 2022-05-01 ENCOUNTER — Other Ambulatory Visit (HOSPITAL_COMMUNITY): Payer: Self-pay

## 2022-08-14 ENCOUNTER — Other Ambulatory Visit: Payer: Self-pay | Admitting: Internal Medicine

## 2022-08-15 ENCOUNTER — Other Ambulatory Visit (HOSPITAL_COMMUNITY): Payer: Self-pay

## 2022-08-15 MED ORDER — SERTRALINE HCL 100 MG PO TABS
100.0000 mg | ORAL_TABLET | Freq: Every day | ORAL | 0 refills | Status: DC
  Filled 2022-08-15 – 2022-09-10 (×2): qty 90, 90d supply, fill #0

## 2022-08-17 ENCOUNTER — Encounter (HOSPITAL_COMMUNITY): Payer: Self-pay

## 2022-08-17 ENCOUNTER — Other Ambulatory Visit (HOSPITAL_COMMUNITY): Payer: Self-pay

## 2022-08-29 ENCOUNTER — Other Ambulatory Visit (HOSPITAL_COMMUNITY): Payer: Self-pay

## 2022-09-10 ENCOUNTER — Other Ambulatory Visit (HOSPITAL_COMMUNITY): Payer: Self-pay

## 2022-09-22 ENCOUNTER — Emergency Department (HOSPITAL_COMMUNITY)
Admission: EM | Admit: 2022-09-22 | Discharge: 2022-09-23 | Disposition: A | Discharge status: Home / Self Care | Payer: Self-pay | Attending: Student | Admitting: Student

## 2022-09-22 DIAGNOSIS — F17210 Nicotine dependence, cigarettes, uncomplicated: Secondary | ICD-10-CM | POA: Insufficient documentation

## 2022-09-22 DIAGNOSIS — I10 Essential (primary) hypertension: Secondary | ICD-10-CM | POA: Insufficient documentation

## 2022-09-22 DIAGNOSIS — Z794 Long term (current) use of insulin: Secondary | ICD-10-CM | POA: Insufficient documentation

## 2022-09-22 DIAGNOSIS — E119 Type 2 diabetes mellitus without complications: Secondary | ICD-10-CM | POA: Insufficient documentation

## 2022-09-22 DIAGNOSIS — R55 Syncope and collapse: Secondary | ICD-10-CM | POA: Insufficient documentation

## 2022-09-22 LAB — URINALYSIS, ROUTINE W REFLEX MICROSCOPIC
Bilirubin Urine: NEGATIVE
Glucose, UA: 50 mg/dL — AB
Hgb urine dipstick: NEGATIVE
Ketones, ur: NEGATIVE mg/dL
Leukocytes,Ua: NEGATIVE
Nitrite: NEGATIVE
Protein, ur: NEGATIVE mg/dL
Specific Gravity, Urine: 1.021 (ref 1.005–1.030)
pH: 5 (ref 5.0–8.0)

## 2022-09-22 LAB — CBC
HCT: 49.4 % (ref 39.0–52.0)
Hemoglobin: 16.8 g/dL (ref 13.0–17.0)
MCH: 29.3 pg (ref 26.0–34.0)
MCHC: 34 g/dL (ref 30.0–36.0)
MCV: 86.2 fL (ref 80.0–100.0)
Platelets: 263 10*3/uL (ref 150–400)
RBC: 5.73 MIL/uL (ref 4.22–5.81)
RDW: 13.4 % (ref 11.5–15.5)
WBC: 10.8 10*3/uL — ABNORMAL HIGH (ref 4.0–10.5)
nRBC: 0 % (ref 0.0–0.2)

## 2022-09-22 LAB — RAPID URINE DRUG SCREEN, HOSP PERFORMED
Amphetamines: NOT DETECTED
Barbiturates: NOT DETECTED
Benzodiazepines: NOT DETECTED
Cocaine: NOT DETECTED
Opiates: NOT DETECTED
Tetrahydrocannabinol: NOT DETECTED

## 2022-09-22 LAB — BASIC METABOLIC PANEL
Anion gap: 10 (ref 5–15)
BUN: 19 mg/dL (ref 6–20)
CO2: 26 mmol/L (ref 22–32)
Calcium: 9.3 mg/dL (ref 8.9–10.3)
Chloride: 101 mmol/L (ref 98–111)
Creatinine, Ser: 0.89 mg/dL (ref 0.61–1.24)
GFR, Estimated: >60 mL/min (ref >60)
Glucose, Bld: 197 mg/dL — ABNORMAL HIGH (ref 70–99)
Potassium: 4.1 mmol/L (ref 3.5–5.1)
Sodium: 137 mmol/L (ref 135–145)

## 2022-09-22 LAB — TROPONIN I (HIGH SENSITIVITY): Troponin I (High Sensitivity): 4 ng/L (ref <18)

## 2022-09-22 LAB — CBG MONITORING, ED: Glucose-Capillary: 211 mg/dL — ABNORMAL HIGH (ref 70–99)

## 2022-09-22 NOTE — ED Provider Triage Note (Signed)
Emergency Medicine Provider Triage Evaluation Note  George Howe , a 36 y.o. male  was evaluated in triage.  Pt complains of altered mental status. 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 Did not fall to the ground, did not pass out, but states that it felt like a "bomb went off in his head" and his mind went blank and lost all control of his motor function. Symptoms lasted 30 seconds or less. Had b/l leg numbness when symptoms improved which has changed to BLE tingling. No upper extremity weakness or numbness. Denies headache, neck pain, back pain, CP, SOB, N/V, tongue biting, incontinence, recent fevers or viral illness.  Review of Systems  Positive: As above Negative: As above  Physical Exam  BP (!) 152/104 (BP Location: Right Arm)   Pulse 90   Temp 98.1 F (36.7 C) (Oral)   Resp 20   Ht 5\' 2"  (1.575 m)   Wt 93.4 kg   SpO2 98%   BMI 37.68 kg/m  Gen:   Awake, no distress   Resp:  Normal effort  MSK:   Moves extremities without difficulty  Other:  GCS 15. Speech is goal oriented. No deficits appreciated to CN III-XII; symmetric eyebrow raise, no facial drooping, tongue midline. Patient has equal grip strength bilaterally with 5/5 strength against resistance in all major muscle groups bilaterally. Sensation to light touch intact. Patient moves extremities without ataxia. Patient ambulatory with steady gait.  Medical Decision Making  Medically screening exam initiated at 10:23 PM.  Appropriate orders placed.  Chay Mazzoni was informed that the remainder of the evaluation will be completed by another provider, this initial triage assessment does not replace that evaluation, and the importance of remaining in the ED until their evaluation is complete.  Altered mental status - work up initiated.   [name], PA-C 09/22/22 2227

## 2022-09-22 NOTE — ED Triage Notes (Addendum)
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 Pt did not fall, No LOC. Pt also reports resolving "tingling" feeling in both legs. Pt ambulatory in triage.

## 2022-09-23 ENCOUNTER — Emergency Department (HOSPITAL_COMMUNITY): Payer: Self-pay

## 2022-09-23 LAB — TROPONIN I (HIGH SENSITIVITY): Troponin I (High Sensitivity): 4 ng/L (ref <18)

## 2022-09-23 MED ORDER — IOHEXOL 350 MG/ML SOLN
75.0000 mL | Freq: Once | INTRAVENOUS | Status: AC | PRN
  Administered 2022-09-23: 75 mL via INTRAVENOUS

## 2022-09-23 NOTE — ED Provider Notes (Signed)
[facility] EMERGENCY DEPARTMENT Provider Note  CSN: [id number] Arrival date & time: 09/22/22 2046  Chief Complaint(s) Near Syncope  HPI George Howe is a 36 y.o. male with PMH T2DM on insulin, HTN, HLD who presents emergency department for evaluation of dizziness and near syncope.  Patient states that this is the second time this has happened and he was at work when he felt like "a flash bang went off" in his vision and he felt like he was going to pass out.  He felt unsteady on his feet and hunched over leading to resolution of symptoms within 15 seconds.  Denies associated chest pain, shortness of breath, diaphoresis, visual deficits or other systemic symptoms.  Patient had an extended ER wait time in the lobby and at time of my evaluation he is completely asymptomatic.  Of note, patient states that he has been hypoglycemic before and this felt distinctly different than when he has low blood sugar.  No hypoglycemia noted here on arrival   Past Medical History Past Medical History:  Diagnosis Date   Chicken pox    Depression    Diabetes mellitus without complication ([place])    DM (diabetes mellitus) ([place])    Hay fever    Hyperlipidemia    Hyperlipidemia    Morbid obesity ([place])    PONV (postoperative nausea and vomiting)    Patient Active Problem List   Diagnosis Date Noted   Carpal tunnel syndrome, left upper limb    Carpal tunnel syndrome, right upper limb    Bilateral carpal tunnel syndrome 08/07/2021   GAD (generalized anxiety disorder) 05/01/2021   MDD (major depressive disorder), recurrent severe, without psychosis ([place]) 04/30/2021   Hyperlipidemia 03/08/2021   Morbid obesity ([place]) 03/08/2021   Diabetes mellitus ([place]) 01/19/2021   Hypertriglyceridemia    Home Medication(s) Prior to Admission medications   Medication Sig Start Date End Date Taking? Authorizing Provider  atorvastatin (LIPITOR) 40 MG tablet Take 1 tablet (40 mg total) by mouth  daily. Patient taking differently: Take 40 mg by mouth every evening. 10/11/21  Yes Isaac Bliss, Rayford Halsted, MD  cetirizine (ZYRTEC) 10 MG tablet Take 1 tablet (10 mg total) by mouth daily as needed for allergies. 12/19/21  Yes Isaac Bliss, Rayford Halsted, MD  ibuprofen (ADVIL) 200 MG tablet Take 400 mg by mouth every 8 (eight) hours as needed for headache or moderate pain.   Yes [provider]  insulin aspart (NOVOLOG) 100 UNIT/ML FlexPen Inject 4 units with each meal. Additionally, add 3 units for each 40 points above 140. Patient taking differently: Inject 4 Units into the skin 3 (three) times daily with meals. 02/15/21  Yes Jose Persia, MD  insulin glargine-yfgn (SEMGLEE) 100 UNIT/ML Pen Inject 33 Units into the skin daily. Patient taking differently: Inject 30 Units into the skin at bedtime. 10/11/21  Yes Isaac Bliss, Rayford Halsted, MD  Multiple Vitamins-Minerals (MULTIVITAMIN WITH MINERALS) tablet Take 1 tablet by mouth daily.   Yes [provider]  naproxen (NAPROSYN) 250 MG tablet Take 250 mg by mouth daily as needed for mild pain.   Yes [provider]  naproxen sodium (ALEVE) 220 MG tablet Take 440 mg by mouth daily as needed (pain).   Yes [provider]  ondansetron (ZOFRAN-ODT) 4 MG disintegrating tablet Take 1 tablet (4 mg total) by mouth every 8 (eight) hours as needed for nausea or vomiting. 04/30/22  Yes Davonna Belling, MD  sertraline (ZOLOFT) 100 MG tablet Take 1 tablet (100 mg  total) by mouth daily. 08/15/22  Yes Isaac Bliss, Rayford Halsted, MD  blood glucose meter kit and supplies KIT 1 each. Check blood sugar before meals and at bedtime    [provider]  Continuous Blood Gluc Sensor (FREESTYLE LIBRE 3 SENSOR) MISC Place 1 sensor on the skin every 14 days. Use to check glucose continuously 07/26/21   Isaac Bliss, Rayford Halsted, MD  HYDROmorphone (DILAUDID) 2 MG tablet Take 1 tablet (2 mg total) by mouth every 6 (six) hours as needed  for severe pain. Patient not taking: Reported on 09/22/2022 04/30/22   Davonna Belling, MD  Insulin Pen Needle (PEN NEEDLES) 32G X 4 MM MISC Use 1 pen needle to inject insulin 4 times per day. 01/11/21   Jose Persia, MD                                                                                                                                    Past Surgical History Past Surgical History:  Procedure Laterality Date   CARPAL TUNNEL RELEASE Right 10/18/2021   Procedure: Right CARPAL TUNNEL RELEASE;  Surgeon: Sherilyn Cooter, MD;  Location: [facility];  Service: Orthopedics;  Laterality: Right;   CARPAL TUNNEL RELEASE Left 11/01/2021   Procedure: Left CARPAL TUNNEL RELEASE;  Surgeon: Sherilyn Cooter, MD;  Location: [facility];  Service: Orthopedics;  Laterality: Left;   Family History Family History  Problem Relation Age of Onset   Diabetes Father    Diabetes Paternal Grandmother     Social History Social History   Tobacco Use   Smoking status: Every Day    Packs/day: 1.00    Years: 17.00    Total pack years: 17.00    Types: Cigarettes   Smokeless tobacco: Never  Vaping Use   Vaping Use: Never used  Substance Use Topics   Alcohol use: Yes    Comment: occasional   Drug use: Yes    Types: Marijuana    Comment: last 10-17-21   Allergies Patient has no known allergies.  Review of Systems Review of Systems  Neurological:  Positive for light-headedness.    Physical Exam Vital Signs  I have reviewed the triage vital signs BP (!) 131/102   Pulse 78   Temp 98.1 F (36.7 C)   Resp 18   Ht _0  (1.575 m)   Wt 93.4 kg   SpO2 98%   BMI 37.68 kg/m   Physical Exam Constitutional:      General: He is not in acute distress.    Appearance: Normal appearance.  HENT:     Head: Normocephalic and atraumatic.     Nose: No congestion or rhinorrhea.  Eyes:     General:        Right eye: No discharge.        Left eye: No discharge.     Extraocular  Movements: Extraocular movements intact.     Pupils:  Pupils are equal, round, and reactive to light.  Cardiovascular:     Rate and Rhythm: Normal rate and regular rhythm.     Heart sounds: No murmur heard. Pulmonary:     Effort: No respiratory distress.     Breath sounds: No wheezing or rales.  Abdominal:     General: There is no distension.     Tenderness: There is no abdominal tenderness.  Musculoskeletal:        General: Normal range of motion.     Cervical back: Normal range of motion.  Skin:    General: Skin is warm and dry.  Neurological:     General: No focal deficit present.     Mental Status: He is alert.     ED Results and Treatments Labs (all labs ordered are listed, but only abnormal results are displayed) Labs Reviewed  BASIC METABOLIC PANEL - Abnormal; Notable for the following components:      Result Value   Glucose, Bld 197 (*)    All other components within normal limits  CBC - Abnormal; Notable for the following components:   WBC 10.8 (*)    All other components within normal limits  URINALYSIS, ROUTINE W REFLEX MICROSCOPIC - Abnormal; Notable for the following components:   Glucose, UA 50 (*)    All other components within normal limits  CBG MONITORING, ED - Abnormal; Notable for the following components:   Glucose-Capillary 211 (*)    All other components within normal limits  RAPID URINE DRUG SCREEN, HOSP PERFORMED  TROPONIN I (HIGH SENSITIVITY)  TROPONIN I (HIGH SENSITIVITY)                                                                                                                          Radiology CT ANGIO HEAD NECK W WO CM  Result Date: 09/23/2022 CLINICAL DATA:  36 year old male with syncope, near syncope. EXAM: CT ANGIOGRAPHY HEAD AND NECK TECHNIQUE: Multidetector CT imaging of the head and neck was performed using the standard protocol during bolus administration of intravenous contrast. Multiplanar CT image reconstructions and MIPs were  obtained to evaluate the vascular anatomy. Carotid stenosis measurements (when applicable) are obtained utilizing NASCET criteria, using the distal internal carotid diameter as the denominator. RADIATION DOSE REDUCTION: This exam was performed according to the departmental dose-optimization program which includes automated exposure control, adjustment of the mA and/or kV according to patient size and/or use of iterative reconstruction technique. CONTRAST:  26m OMNIPAQUE IOHEXOL 350 MG/ML SOLN COMPARISON:  None Available. FINDINGS: CT HEAD Brain: Normal cerebral volume. No midline shift, ventriculomegaly, mass effect, evidence of mass lesion, intracranial hemorrhage or evidence of cortically based acute infarction. Gray-white matter differentiation is within normal limits throughout the brain. Calvarium and skull base: Negative. Paranasal sinuses: Minor paranasal sinus mucosal thickening. No sinus fluid levels. Tympanic cavities mastoids are clear. Orbits: Visualized orbits and scalp soft tissues are within normal limits. CTA NECK Skeleton: Carious bilateral molars and wisdom teeth. No  acute osseous abnormality identified. Upper chest: Negative. Other neck: Negative. Aortic arch: Normal 3 vessel arch configuration with no atherosclerosis. Right carotid system: Dense right subclavian venous contrast streak artifact. But the right brachiocephalic and proximal right CCA appear to remain normal. Negative right carotid bifurcation, cervical right ICA. Left carotid system: Negative. Vertebral arteries: Right subclavian venous streak artifact but the proximal right subclavian artery remains patent. Normal right vertebral artery origin. Right vertebral is patent and normal to the skull base. Normal proximal left subclavian artery and left vertebral artery origin. Slightly non dominant left vertebral artery is patent and normal to the skull base. CTA HEAD Posterior circulation: Mild dominance of the right V4 segment. Normal  distal vertebral arteries, vertebrobasilar junction, PICA origins. Patent basilar artery. Patent SCA and PCA origins. Fetal type right PCA origin, diminutive left posterior communicating artery. Bilateral PCA branches are within normal limits. Anterior circulation: Both ICA siphons are patent and appear normal. Ophthalmic and posterior communicating artery origins appear normal. Patent carotid termini. Normal MCA and ACA origins. Anterior communicating artery and bilateral ACA branches are within normal limits. Left MCA M1 segment and bifurcation are patent. Right MCA M1 segment and trifurcation are patent. MCA branches appear normal. Venous sinuses: Early contrast timing, grossly patent. Anatomic variants: Slightly dominant right vertebral artery and fetal type right PCA origin. Review of the MIP images confirms the above findings IMPRESSION: 1. Normal CTA Head and Neck. 2. Normal CT appearance of the brain. 3. Carious posterior dentition. Electronically Signed   By: Genevie Ann M.D.   On: 09/23/2022 05:39    Pertinent labs & imaging results that were available during my care of the patient were reviewed by me and considered in my medical decision making (see MDM for details).  Medications Ordered in ED Medications  iohexol (OMNIPAQUE) 350 MG/ML injection 75 mL (75 mLs Intravenous Contrast Given 09/23/22 0509)                                                                                                                                     Procedures Procedures  (including critical care time)  Medical Decision Making / ED Course   This patient presents to the ED for concern of presyncope, this involves an extensive number of treatment options, and is a complaint that carries with it a high risk of complications and morbidity.  The differential diagnosis includes orthostatic presyncope, vasovagal presyncope, cardiogenic presyncope, electrolyte abnormality, transient global amnesia  MDM: Seen the  emergency room for evaluation of presyncope.  Physical exam unremarkable.  Laboratory evaluation with mild leukocytosis to 10.8 but is otherwise unremarkable.  High-sensitivity opponent negative.  UDS obtained in the setting of presyncope to rule out polysubstance abuse as the source which was reassuringly negative.  ECG nonischemic and with no evidence of Brugada or WPW.  CT angio brain and neck obtained to rule out vascular cause of his presyncope which was reassuringly  normal.  At this time, patient does not meet inpatient criteria for admission and a referral was placed to cardiology for further syncope evaluation.  Patient then discharged with outpatient cardiology follow-up and return precautions given which she voiced understanding.  Patient also counseled on cessation of cigarette smoking.   Additional history obtained: -Additional history obtained from friend -External records from outside source obtained and reviewed including: Chart review including previous notes, labs, imaging, consultation notes   Lab Tests: -I ordered, reviewed, and interpreted labs.   The pertinent results include:   Labs Reviewed  BASIC METABOLIC PANEL - Abnormal; Notable for the following components:      Result Value   Glucose, Bld 197 (*)    All other components within normal limits  CBC - Abnormal; Notable for the following components:   WBC 10.8 (*)    All other components within normal limits  URINALYSIS, ROUTINE W REFLEX MICROSCOPIC - Abnormal; Notable for the following components:   Glucose, UA 50 (*)    All other components within normal limits  CBG MONITORING, ED - Abnormal; Notable for the following components:   Glucose-Capillary 211 (*)    All other components within normal limits  RAPID URINE DRUG SCREEN, HOSP PERFORMED  TROPONIN I (HIGH SENSITIVITY)  TROPONIN I (HIGH SENSITIVITY)      EKG   EKG Interpretation  Date/Time:  Saturday September 22 2022 21:52:08 EST Ventricular Rate:   93 PR Interval:  128 QRS Duration: 80 QT Interval:  374 QTC Calculation: 465 R Axis:   9 Text Interpretation: Normal sinus rhythm Indeterminate axis Borderline ECG When compared with ECG of 17-Oct-2021 11:13, PREVIOUS ECG IS PRESENT Confirmed by Keishawn Darsey ([id number]) on 09/23/2022 5:16:53 AM         Imaging Studies ordered: I ordered imaging studies including CT angio brain and neck I independently visualized and interpreted imaging. I agree with the radiologist interpretation   Medicines ordered and prescription drug management: Meds ordered this encounter  Medications   iohexol (OMNIPAQUE) 350 MG/ML injection 75 mL    -I have reviewed the patients home medicines and have made adjustments as needed  Critical interventions none   Cardiac Monitoring: The patient was maintained on a cardiac monitor.  I personally viewed and interpreted the cardiac monitored which showed an underlying rhythm of: NSR  Social Determinants of Health:  Factors impacting patients care include: Smokes cigarettes   Reevaluation: After the interventions noted above, I reevaluated the patient and found that they have :stayed the same  Co morbidities that complicate the patient evaluation  Past Medical History:  Diagnosis Date   Chicken pox    Depression    Diabetes mellitus without complication ([place])    DM (diabetes mellitus) ([place])    Hay fever    Hyperlipidemia    Hyperlipidemia    Morbid obesity ([place])    PONV (postoperative nausea and vomiting)       Dispostion: I considered admission for this patient, but he does not meet inpatient criteria for admission he is safe for discharge to outpatient follow-up     Final Clinical Impression(s) / ED Diagnoses Final diagnoses:  Near syncope     _0 @    Teressa Lower, MD 09/23/22 [phone]

## 2022-10-01 ENCOUNTER — Encounter: Payer: Self-pay | Admitting: Internal Medicine

## 2022-10-01 ENCOUNTER — Other Ambulatory Visit (HOSPITAL_COMMUNITY): Payer: Self-pay

## 2022-10-01 ENCOUNTER — Ambulatory Visit (INDEPENDENT_AMBULATORY_CARE_PROVIDER_SITE_OTHER): Payer: Self-pay | Admitting: Internal Medicine

## 2022-10-01 VITALS — BP 148/92 | HR 100 | Wt 212.3 lb

## 2022-10-01 DIAGNOSIS — F17210 Nicotine dependence, cigarettes, uncomplicated: Secondary | ICD-10-CM

## 2022-10-01 DIAGNOSIS — E782 Mixed hyperlipidemia: Secondary | ICD-10-CM

## 2022-10-01 DIAGNOSIS — F411 Generalized anxiety disorder: Secondary | ICD-10-CM

## 2022-10-01 DIAGNOSIS — F332 Major depressive disorder, recurrent severe without psychotic features: Secondary | ICD-10-CM

## 2022-10-01 DIAGNOSIS — E1169 Type 2 diabetes mellitus with other specified complication: Secondary | ICD-10-CM

## 2022-10-01 DIAGNOSIS — Z09 Encounter for follow-up examination after completed treatment for conditions other than malignant neoplasm: Secondary | ICD-10-CM

## 2022-10-01 DIAGNOSIS — R030 Elevated blood-pressure reading, without diagnosis of hypertension: Secondary | ICD-10-CM

## 2022-10-01 DIAGNOSIS — Z794 Long term (current) use of insulin: Secondary | ICD-10-CM

## 2022-10-01 LAB — POCT GLYCOSYLATED HEMOGLOBIN (HGB A1C): Hemoglobin A1C: 8.9 % — AB (ref 4.0–5.6)

## 2022-10-01 MED ORDER — BUPROPION HCL ER (XL) 150 MG PO TB24
150.0000 mg | ORAL_TABLET | Freq: Every day | ORAL | 1 refills | Status: DC
  Filled 2022-10-01: qty 90, 90d supply, fill #0
  Filled 2022-12-29: qty 90, 90d supply, fill #1

## 2022-10-01 MED ORDER — FREESTYLE LIBRE 3 SENSOR MISC
3 refills | Status: DC
  Filled 2022-10-01 – 2022-10-22 (×2): qty 2, 28d supply, fill #0
  Filled 2022-12-29 – 2023-01-25 (×4): qty 2, 28d supply, fill #1
  Filled 2023-02-16: qty 2, 28d supply, fill #2
  Filled 2023-03-08: qty 2, 28d supply, fill #3

## 2022-10-01 NOTE — Progress Notes (Signed)
Established Patient Office Visit     CC/Reason for Visit: Hospital discharge follow-up  HPI: George Howe is a 37 y.o. male who is coming in today for the above mentioned reasons. Past Medical History is significant for: Insulin-dependent diabetes following a bout of pancreatitis, major depression.  He was seen in the emergency department on New Year's Eve after a near syncopal episode.  Workup in the emergency department was essentially unremarkable including CT angio head of neck, EKG, heart workup.  He was not hypoglycemic.  His blood pressure was noted to be 132/92 at the time.  He is here today for follow-up.  I have not seen him since November 2022.  In the interim he has had carpal tunnel surgery.   Past Medical/Surgical History: Past Medical History:  Diagnosis Date   Chicken pox    Depression    Diabetes mellitus without complication ([facility])    DM (diabetes mellitus) ([facility])    Hay fever    Hyperlipidemia    Hyperlipidemia    Morbid obesity ([facility])    PONV (postoperative nausea and vomiting)     Past Surgical History:  Procedure Laterality Date   CARPAL TUNNEL RELEASE Right 10/18/2021   Procedure: Right CARPAL TUNNEL RELEASE;  Surgeon: Marlyne Beards, MD;  Location: [facility];  Service: Orthopedics;  Laterality: Right;   CARPAL TUNNEL RELEASE Left 11/01/2021   Procedure: Left CARPAL TUNNEL RELEASE;  Surgeon: Marlyne Beards, MD;  Location: [facility];  Service: Orthopedics;  Laterality: Left;    Social History:  reports that he has been smoking cigarettes. He has a 17.00 pack-year smoking history. He has never used smokeless tobacco. He reports current alcohol use. He reports current drug use. Drug: Marijuana.  Allergies: No Known Allergies  Family History:  Family History  Problem Relation Age of Onset   Diabetes Father    Diabetes Paternal Grandmother      Current Outpatient Medications:    atorvastatin (LIPITOR) 40 MG tablet, Take 1 tablet  (40 mg total) by mouth daily. (Patient taking differently: Take 40 mg by mouth every evening.), Disp: 90 tablet, Rfl: 1   buPROPion (WELLBUTRIN XL) 150 MG 24 hr tablet, Take 1 tablet (150 mg total) by mouth daily., Disp: 90 tablet, Rfl: 1   cetirizine (ZYRTEC) 10 MG tablet, Take 1 tablet (10 mg total) by mouth daily as needed for allergies., Disp: 90 tablet, Rfl: 0   Continuous Blood Gluc Sensor (FREESTYLE LIBRE 3 SENSOR) MISC, Place 1 sensor on the skin every 14 days. Use to check glucose continuously, Disp: 2 each, Rfl: 3   insulin aspart (NOVOLOG) 100 UNIT/ML FlexPen, Inject 4 units with each meal. Additionally, add 3 units for each 40 points above 140. (Patient taking differently: Inject 4 Units into the skin 3 (three) times daily with meals.), Disp: 12 mL, Rfl: 6   insulin glargine-yfgn (SEMGLEE) 100 UNIT/ML Pen, Inject 33 Units into the skin daily. (Patient taking differently: Inject 30 Units into the skin at bedtime.), Disp: 15 mL, Rfl: 1   Insulin Pen Needle (PEN NEEDLES) 32G X 4 MM MISC, Use 1 pen needle to inject insulin 4 times per day., Disp: 200 each, Rfl: 11   Multiple Vitamins-Minerals (MULTIVITAMIN WITH MINERALS) tablet, Take 1 tablet by mouth daily., Disp: , Rfl:    naproxen (NAPROSYN) 250 MG tablet, Take 250 mg by mouth daily as needed for mild pain., Disp: , Rfl:    naproxen sodium (ALEVE) 220 MG tablet, Take 440 mg  by mouth daily as needed (pain)., Disp: , Rfl:    sertraline (ZOLOFT) 100 MG tablet, Take 1 tablet (100 mg total) by mouth daily., Disp: 90 tablet, Rfl: 0  Review of Systems:  Constitutional: Denies fever, chills, diaphoresis, appetite change and fatigue.  HEENT: Denies photophobia, eye pain, redness, hearing loss, ear pain, congestion, sore throat, rhinorrhea, sneezing, mouth sores, trouble swallowing, neck pain, neck stiffness and tinnitus.   Respiratory: Denies SOB, DOE, cough, chest tightness,  and wheezing.   Cardiovascular: Denies chest pain, palpitations and leg  swelling.  Gastrointestinal: Denies nausea, vomiting, abdominal pain, diarrhea, constipation, blood in stool and abdominal distention.  Genitourinary: Denies dysuria, urgency, frequency, hematuria, flank pain and difficulty urinating.  Endocrine: Denies: hot or cold intolerance, sweats, changes in hair or nails, polyuria, polydipsia. Musculoskeletal: Denies myalgias, back pain, joint swelling, arthralgias and gait problem.  Skin: Denies pallor, rash and wound.  Neurological: Denies dizziness, seizures, syncope, weakness, light-headedness, numbness and headaches.  Hematological: Denies adenopathy. Easy bruising, personal or family bleeding history  Psychiatric/Behavioral: Denies suicidal ideation, mood changes, confusion, nervousness, sleep disturbance and agitation    Physical Exam: Vitals:   10/01/22 1456 10/01/22 1501  BP: (!) 140/80 (!) 148/92  Pulse: 100   SpO2: 97%   Weight: 212 lb 4.8 oz (96.3 kg)     Body mass index is 38.83 kg/m.   Constitutional: NAD, calm, comfortable Eyes: PERRL, lids and conjunctivae normal ENMT: Mucous membranes are moist.  Respiratory: clear to auscultation bilaterally, no wheezing, no crackles. Normal respiratory effort. No accessory muscle use.  Cardiovascular: Regular rate and rhythm, no murmurs / rubs / gallops. No extremity edema.   Psychiatric: Normal judgment and insight. Alert and oriented x 3. Normal mood.   [facility] Visit from 10/01/2022 in [facility]  PHQ-9 Total Score 2         Impression and Plan:  Hospital discharge follow-up  Type 2 diabetes mellitus with other specified complication, with long-term current use of insulin ([place]) - Plan: POCT glycosylated hemoglobin (Hb A1C), Urine microalbumin-creatinine with uACR, Ambulatory referral to Endocrinology, Continuous Blood Gluc Sensor (FREESTYLE LIBRE 3 SENSOR) MISC  GAD (generalized anxiety disorder)  Cigarette nicotine dependence without  complication - Plan: buPROPion (WELLBUTRIN XL) 150 MG 24 hr tablet  Morbid obesity ([place])  Mixed hyperlipidemia  MDD (major depressive disorder), recurrent severe, without psychosis ([place])  Elevated BP without diagnosis of hypertension  -Hospital charts reviewed in detail. -Workup for his syncopal event was essentially negative. -Blood pressure is elevated today, he is not known to be hypertensive.  We have discussed ambulatory blood pressure monitoring and he will return in 3 months for follow-up. -He is very interested in smoking cessation.  I have given him information on setting up CBT, will start Wellbutrin 150 mg daily.  Follow-up at next visit. -A1c of 8.9 demonstrates poorly controlled diabetes.  Samples of freestyle libre 3 have been provided today.  He is insulin-dependent, endocrinology referral has been placed.  Time spent:34 minutes reviewing chart, interviewing and examining patient and formulating plan of care.     Lelon Frohlich, MD [facility]

## 2022-10-04 ENCOUNTER — Other Ambulatory Visit: Payer: Self-pay

## 2022-10-04 ENCOUNTER — Other Ambulatory Visit (HOSPITAL_COMMUNITY): Payer: Self-pay

## 2022-10-05 ENCOUNTER — Other Ambulatory Visit (HOSPITAL_COMMUNITY): Payer: Self-pay

## 2022-10-05 ENCOUNTER — Other Ambulatory Visit: Payer: Self-pay

## 2022-10-05 ENCOUNTER — Other Ambulatory Visit: Payer: Self-pay | Admitting: Internal Medicine

## 2022-10-08 ENCOUNTER — Other Ambulatory Visit (HOSPITAL_COMMUNITY): Payer: Self-pay

## 2022-10-08 MED ORDER — INSULIN GLARGINE 100 UNIT/ML SOLOSTAR PEN
33.0000 [IU] | PEN_INJECTOR | Freq: Every day | SUBCUTANEOUS | 1 refills | Status: DC
  Filled 2022-10-08: qty 15, 45d supply, fill #0
  Filled 2022-10-10: qty 9, 27d supply, fill #0

## 2022-10-10 ENCOUNTER — Encounter: Payer: Self-pay | Admitting: Cardiovascular Disease

## 2022-10-10 ENCOUNTER — Other Ambulatory Visit (HOSPITAL_COMMUNITY): Payer: Self-pay

## 2022-10-10 ENCOUNTER — Ambulatory Visit: Payer: 59

## 2022-10-10 ENCOUNTER — Other Ambulatory Visit: Payer: Self-pay | Admitting: Internal Medicine

## 2022-10-10 ENCOUNTER — Ambulatory Visit: Payer: 59 | Attending: Cardiovascular Disease | Admitting: Cardiovascular Disease

## 2022-10-10 VITALS — BP 118/82 | HR 73 | Ht 62.0 in | Wt 211.4 lb

## 2022-10-10 DIAGNOSIS — R42 Dizziness and giddiness: Secondary | ICD-10-CM | POA: Diagnosis not present

## 2022-10-10 DIAGNOSIS — R55 Syncope and collapse: Secondary | ICD-10-CM

## 2022-10-10 DIAGNOSIS — E782 Mixed hyperlipidemia: Secondary | ICD-10-CM | POA: Diagnosis not present

## 2022-10-10 DIAGNOSIS — E1310 Other specified diabetes mellitus with ketoacidosis without coma: Secondary | ICD-10-CM

## 2022-10-10 LAB — HEPATIC FUNCTION PANEL
ALT: 138 IU/L — ABNORMAL HIGH (ref 0–44)
AST: 32 IU/L (ref 0–40)
Albumin: 4.7 g/dL (ref 4.1–5.1)
Alkaline Phosphatase: 104 IU/L (ref 44–121)
Bilirubin Total: 0.8 mg/dL (ref 0.0–1.2)
Bilirubin, Direct: 0.2 mg/dL (ref 0.00–0.40)
Total Protein: 6.7 g/dL (ref 6.0–8.5)

## 2022-10-10 LAB — LIPID PANEL
Chol/HDL Ratio: 3.4 ratio (ref 0.0–5.0)
Cholesterol, Total: 109 mg/dL (ref 100–199)
HDL: 32 mg/dL — ABNORMAL LOW (ref >39)
LDL Chol Calc (NIH): 48 mg/dL (ref 0–99)
Triglycerides: 171 mg/dL — ABNORMAL HIGH (ref 0–149)
VLDL Cholesterol Cal: 29 mg/dL (ref 5–40)

## 2022-10-10 MED ORDER — NOVOLOG FLEXPEN 100 UNIT/ML ~~LOC~~ SOPN
PEN_INJECTOR | SUBCUTANEOUS | 6 refills | Status: DC
  Filled 2022-10-10: qty 6, 28d supply, fill #0
  Filled 2022-10-22 – 2022-11-02 (×4): qty 6, 28d supply, fill #1
  Filled 2022-11-22 – 2022-12-29 (×2): qty 6, 28d supply, fill #2
  Filled 2023-02-16: qty 6, 28d supply, fill #3
  Filled 2023-03-08 – 2023-03-09 (×2): qty 6, 28d supply, fill #4
  Filled 2023-04-01: qty 6, 28d supply, fill #5
  Filled 2023-04-15 – 2023-04-23 (×5): qty 6, 28d supply, fill #6

## 2022-10-10 NOTE — Assessment & Plan Note (Signed)
History of hyperlipidemia on statin therapy lipid profile performed 05/02/2021 revealing total cholesterol of 118, LDL 53 and HDL 36.  Will recheck a lipid liver profile this morning.

## 2022-10-10 NOTE — Progress Notes (Unsigned)
Enrolled patient for a 14 day Zio XT  monitor to be mailed to patients home  °

## 2022-10-10 NOTE — Patient Instructions (Signed)
Medication Instructions:  Your physician recommends that you continue on your current medications as directed. Please refer to the Current Medication list given to you today.  *If you need a refill on your cardiac medications before your next appointment, please call your pharmacy*   Lab Work: Your physician recommends that you have labs drawn today: Lipid/liver panel  If you have labs (blood work) drawn today and your tests are completely normal, you will receive your results only by: MyChart Message (if you have MyChart) OR A paper copy in the mail If you have any lab test that is abnormal or we need to change your treatment, we will call you to review the results.   Testing/Procedures: Your physician has requested that you have an echocardiogram. Echocardiography is a painless test that uses sound waves to create images of your heart. It provides your doctor with information about the size and shape of your heart and how well your heart's chambers and valves are working. This procedure takes approximately one hour. There are no restrictions for this procedure. Please do NOT wear cologne, perfume, aftershave, or lotions (deodorant is allowed). Please arrive 15 minutes prior to your appointment time. This procedure will be done at [address] Monitor Instructions  Your physician has requested you wear a ZIO patch monitor for 14 days.  This is a single patch monitor. [employer] supplies one patch monitor per enrollment. Additional stickers are not available. Please do not apply patch if you will be having a Nuclear Stress Test,  Echocardiogram, Cardiac CT, MRI, or Chest Xray during the period you would be wearing the  monitor. The patch cannot be worn during these tests. You cannot remove and re-apply the  ZIO XT patch monitor.  Your ZIO patch monitor will be mailed 3 day USPS to your address on file. It may take 3-5 days  to receive your monitor after  you have been enrolled.  Once you have received your monitor, please review the enclosed instructions. Your monitor  has already been registered assigning a specific monitor serial # to you.  Billing and Patient Assistance Program Information  We have supplied [employer] with any of your insurance information on file for billing purposes. [employer] offers a sliding scale Patient Assistance Program for patients that do not have  insurance, or whose insurance does not completely cover the cost of the ZIO monitor.  You must apply for the Patient Assistance Program to qualify for this discounted rate.  To apply, please call [employer] at [phone], select option 4, select option 2, ask to apply for  Patient Assistance Program. Theodore Demark will ask your household income, and how many people  are in your household. They will quote your out-of-pocket cost based on that information.  [employer] will also be able to set up a 46-month, interest-free payment plan if needed.  Applying the monitor   Shave hair from upper left chest.  Hold abrader disc by orange tab. Rub abrader in 40 strokes over the upper left chest as  indicated in your monitor instructions.  Clean area with 4 enclosed alcohol pads. Let dry.  Apply patch as indicated in monitor instructions. Patch will be placed under collarbone on left  side of chest with arrow pointing upward.  Rub patch adhesive wings for 2 minutes. Remove white label marked "1". Remove the white  label marked "2". Rub patch adhesive wings for 2 additional minutes.  While looking in a mirror,  press and release button in center of patch. A small green light will  flash 3-4 times. This will be your only indicator that the monitor has been turned on.  Do not shower for the first 24 hours. You may shower after the first 24 hours.  Press the button if you feel a symptom. You will hear a small click. Record Date, Time and  Symptom in the Patient Logbook.  When you are ready to  remove the patch, follow instructions on the last 2 pages of Patient  Logbook. Stick patch monitor onto the last page of Patient Logbook.  Place Patient Logbook in the blue and white box. Use locking tab on box and tape box closed  securely. The blue and white box has prepaid postage on it. Please place it in the mailbox as  soon as possible. Your physician should have your test results approximately 7 days after the  monitor has been mailed back to [facility].  Call [place] at [phone] if you have questions regarding  your ZIO XT patch monitor. Call them immediately if you see an orange light blinking on your  monitor.  If your monitor falls off in less than 4 days, contact our Monitor department at [phone].  If your monitor becomes loose or falls off after 4 days call [employer] at [phone] for  suggestions on securing your monitor    Follow-Up: At [facility], you and your health needs are our priority.  As part of our continuing mission to provide you with exceptional heart care, we have created designated Provider Care Teams.  These Care Teams include your primary Cardiologist (physician) and Advanced Practice Providers (APPs -  Physician Assistants and Nurse Practitioners) who all work together to provide you with the care you need, when you need it.  We recommend signing up for the patient portal called "MyChart".  Sign up information is provided on this After Visit Summary.  MyChart is used to connect with patients for Virtual Visits (Telemedicine).  Patients are able to view lab/test results, encounter notes, upcoming appointments, etc.  Non-urgent messages can be sent to your provider as well.   To learn more about what you can do with MyChart, go to [email or web address].    Your next appointment:   We will see you on an as needed basis.  Provider:   Quay Burow, MD

## 2022-10-10 NOTE — Assessment & Plan Note (Signed)
George Howe was referred by the ER for an episode of presyncope.  He was seen on 09/22/2022.  He was at work and suddenly developed dizziness but no significant loss of consciousness.  There were no other associated symptoms to suggest that this was either vagally mediated or hypoglycemic.  His serum glucose was actually mildly elevated.  The symptoms resolved within 15 minutes.  He was evaluated in the ER.  His workup was unrevealing.  He has had no recurrent symptoms.  I am going to get a 2-week Zio patch and a 2D echo to further evaluate.

## 2022-10-10 NOTE — Progress Notes (Signed)
10/10/2022 [name]   12/30/1985  [id number]  Primary Physician George Howe, George Halsted, MD Primary Cardiologist: Lorretta Harp MD George Howe, [place]  HPI:  George Howe is a 37 y.o. moderately overweight single Caucasian male with no children who works as a Biomedical scientist at [employer].  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  His cardiac risk factor profile is notable for 20 pack years tobacco abuse having recently discontinued, treated hyperlipidemia and diabetes.  There is no family history George Howe is never had a heart attack or stroke.  He denies chest pain or shortness of breath.  He works out 2 to 3 days a week.  On 09/22/2022 he was at work, 8 hours into her 12-hour shift, and suddenly became dizzy.  There were no other associated symptoms.  He did not lose consciousness.  He was seen in the ER and was evaluated.  The workup was unrevealing.  He has had no recurrent symptoms.  He does have a continuous glucose monitor.  His hemoglobin A1c is 8.9%.  He was recently been referred to an endocrinologist for more aggressive therapy.   Current Meds  Medication Sig   atorvastatin (LIPITOR) 40 MG tablet Take 1 tablet (40 mg total) by mouth daily. (Patient taking differently: Take 40 mg by mouth every evening.)   buPROPion (WELLBUTRIN XL) 150 MG 24 hr tablet Take 1 tablet (150 mg total) by mouth daily.   cetirizine (ZYRTEC) 10 MG tablet Take 1 tablet (10 mg total) by mouth daily as needed for allergies.   Continuous Blood Gluc Sensor (FREESTYLE LIBRE 3 SENSOR) MISC Place 1 sensor on the skin every 14 days. Use to check glucose continuously   insulin aspart (NOVOLOG) 100 UNIT/ML FlexPen Inject 4 units with each meal. Additionally, add 3 units for each 40 points above 140. (Patient taking differently: Inject 4 Units into the skin 3 (three) times daily with meals.)   insulin glargine-yfgn (SEMGLEE, YFGN,) 100 UNIT/ML Pen Inject 33 Units into the skin daily.    Insulin Pen Needle (PEN NEEDLES) 32G X 4 MM MISC Use 1 pen needle to inject insulin 4 times per day.   Multiple Vitamins-Minerals (MULTIVITAMIN WITH MINERALS) tablet Take 1 tablet by mouth daily.   naproxen (NAPROSYN) 250 MG tablet Take 250 mg by mouth daily as needed for mild pain.   naproxen sodium (ALEVE) 220 MG tablet Take 440 mg by mouth daily as needed (pain).   sertraline (ZOLOFT) 100 MG tablet Take 1 tablet (100 mg total) by mouth daily.     No Known Allergies  Social History   Socioeconomic History   Marital status: Single    Spouse name: Not on file   Number of children: Not on file   Years of education: Not on file   Highest education level: Not on file  Occupational History   Not on file  Tobacco Use   Smoking status: Every Day    Packs/day: 1.00    Years: 17.00    Total pack years: 17.00    Types: Cigarettes   Smokeless tobacco: Never  Vaping Use   Vaping Use: Never used  Substance and Sexual Activity   Alcohol use: Yes    Comment: occasional   Drug use: Yes    Types: Marijuana    Comment: last 10-17-21   Sexual activity: Yes  Other Topics Concern   Not on file  Social History Narrative   Not on file  Social Determinants of Health   Financial Resource Strain: Not on file  Food Insecurity: Not on file  Transportation Needs: Not on file  Physical Activity: Not on file  Stress: Not on file  Social Connections: Not on file  Intimate Partner Violence: Not on file     Review of Systems: General: negative for chills, fever, night sweats or weight changes.  Cardiovascular: negative for chest pain, dyspnea on exertion, edema, orthopnea, palpitations, paroxysmal nocturnal dyspnea or shortness of breath Dermatological: negative for rash Respiratory: negative for cough or wheezing Urologic: negative for hematuria Abdominal: negative for nausea, vomiting, diarrhea, bright red blood per rectum, melena, or hematemesis Neurologic: negative for visual changes,  syncope, or dizziness All other systems reviewed and are otherwise negative except as noted above.    Blood pressure 118/82, pulse 73, height 5\' 2"  (1.575 m), weight 211 lb 6.4 oz (95.9 kg), SpO2 97 %.  General appearance: alert and no distress Neck: no adenopathy, no carotid bruit, no JVD, supple, symmetrical, trachea midline, and thyroid not enlarged, symmetric, no tenderness/mass/nodules Lungs: clear to auscultation bilaterally Heart: regular rate and rhythm, S1, S2 normal, no murmur, click, rub or gallop Extremities: extremities normal, atraumatic, no cyanosis or edema Pulses: 2+ and symmetric Skin: Skin color, texture, turgor normal. No rashes or lesions Neurologic: Grossly normal  EKG sinus rhythm at 73 without ST or T wave changes.  Personally reviewed this EKG.  ASSESSMENT AND PLAN:   Hyperlipidemia History of hyperlipidemia on statin therapy lipid profile performed 05/02/2021 revealing total cholesterol of 118, LDL 53 and HDL 36.  Will recheck a lipid liver profile this morning.  Dizziness Mr. Mayorga was referred by the ER for an episode of presyncope.  He was seen on 09/22/2022.  He was at work and suddenly developed dizziness but no significant loss of consciousness.  There were no other associated symptoms to suggest that this was either vagally mediated or hypoglycemic.  His serum glucose was actually mildly elevated.  The symptoms resolved within 15 minutes.  He was evaluated in the ER.  His workup was unrevealing.  He has had no recurrent symptoms.  I am going to get a 2-week Zio patch and a 2D echo to further evaluate.     Lorretta Harp MD FACP,FACC,FAHA, [facility] 10/10/2022 10:13 AM

## 2022-10-11 ENCOUNTER — Other Ambulatory Visit: Payer: Self-pay

## 2022-10-11 DIAGNOSIS — E782 Mixed hyperlipidemia: Secondary | ICD-10-CM

## 2022-10-22 ENCOUNTER — Other Ambulatory Visit (HOSPITAL_COMMUNITY): Payer: Self-pay

## 2022-10-22 ENCOUNTER — Other Ambulatory Visit: Payer: Self-pay

## 2022-10-25 ENCOUNTER — Telehealth: Payer: Self-pay

## 2022-10-25 ENCOUNTER — Other Ambulatory Visit (HOSPITAL_COMMUNITY): Payer: Self-pay

## 2022-10-25 NOTE — Telephone Encounter (Signed)
Pharmacy Patient Advocate Encounter   Received notification from [facility] that prior authorization for Lantus Solostar is required/requested.  Per Test Claim: Basaglar/Tresiba preferred - Non-formulary drug   PA submitted on 10/25/22 to (ins) [employer] via CoverMyMeds Key [id number]  Status is pending

## 2022-10-29 ENCOUNTER — Other Ambulatory Visit (HOSPITAL_COMMUNITY): Payer: Self-pay

## 2022-10-29 MED ORDER — BASAGLAR KWIKPEN 100 UNIT/ML ~~LOC~~ SOPN
33.0000 [IU] | PEN_INJECTOR | Freq: Every day | SUBCUTANEOUS | 3 refills | Status: DC
  Filled 2022-10-29: qty 9, 27d supply, fill #0
  Filled 2022-12-29: qty 9, 27d supply, fill #1
  Filled 2023-02-16 (×3): qty 9, 27d supply, fill #2
  Filled 2023-03-08 – 2023-03-09 (×2): qty 9, 27d supply, fill #3

## 2022-10-29 NOTE — Addendum Note (Signed)
Addended by: Westley Hummer B on: 10/29/2022 10:20 AM   Modules accepted: Orders

## 2022-10-29 NOTE — Telephone Encounter (Signed)
Received a fax regarding Prior Authorization from [facility] for Lantus Solostar.   Authorization has been DENIED because you do not meet the plan's medical necessity criteria for non-formulary drugs. To be considered for approval, you must show a trial and failure or inability to use all the preferred medication that are covered which include Basaglar and [place].   Phone# [phone]

## 2022-10-29 NOTE — Telephone Encounter (Signed)
Rx sent. Left message on machine for patient. 

## 2022-11-02 ENCOUNTER — Other Ambulatory Visit: Payer: Self-pay

## 2022-11-05 ENCOUNTER — Ambulatory Visit (HOSPITAL_COMMUNITY): Payer: 59 | Attending: Cardiovascular Disease

## 2022-11-05 DIAGNOSIS — E782 Mixed hyperlipidemia: Secondary | ICD-10-CM | POA: Insufficient documentation

## 2022-11-05 DIAGNOSIS — R55 Syncope and collapse: Secondary | ICD-10-CM | POA: Insufficient documentation

## 2022-11-05 DIAGNOSIS — R42 Dizziness and giddiness: Secondary | ICD-10-CM | POA: Insufficient documentation

## 2022-11-05 LAB — ECHOCARDIOGRAM COMPLETE
Area-P 1/2: 4.06 cm2
S' Lateral: 3.2 cm

## 2022-11-22 ENCOUNTER — Other Ambulatory Visit (HOSPITAL_COMMUNITY): Payer: Self-pay

## 2022-11-22 ENCOUNTER — Other Ambulatory Visit: Payer: Self-pay

## 2022-11-23 ENCOUNTER — Other Ambulatory Visit: Payer: Self-pay

## 2022-11-30 ENCOUNTER — Telehealth: Payer: Self-pay | Admitting: Internal Medicine

## 2022-11-30 NOTE — Telephone Encounter (Signed)
Pt is calling and would like a refill on Insulin Pen Needle (PEN NEEDLES) 32G X 4 MM MISC please send to  [place] Phone: [phone]  Fax: [phone]

## 2022-12-03 ENCOUNTER — Other Ambulatory Visit (HOSPITAL_COMMUNITY): Payer: Self-pay

## 2022-12-03 MED ORDER — PEN NEEDLES 32G X 4 MM MISC
11 refills | Status: DC
  Filled 2022-12-03: qty 200, 30d supply, fill #0
  Filled 2022-12-29: qty 100, 25d supply, fill #0
  Filled 2023-02-16: qty 100, 25d supply, fill #1
  Filled 2023-03-08: qty 100, 25d supply, fill #2
  Filled 2023-04-01: qty 100, 25d supply, fill #3
  Filled 2023-04-15 – 2023-04-23 (×4): qty 100, 25d supply, fill #4
  Filled 2023-06-08: qty 100, 25d supply, fill #5
  Filled 2023-07-27 – 2023-07-29 (×2): qty 100, 25d supply, fill #6

## 2022-12-03 NOTE — Telephone Encounter (Signed)
Refill sent.

## 2022-12-13 ENCOUNTER — Other Ambulatory Visit (HOSPITAL_COMMUNITY): Payer: Self-pay

## 2022-12-13 ENCOUNTER — Other Ambulatory Visit (HOSPITAL_BASED_OUTPATIENT_CLINIC_OR_DEPARTMENT_OTHER): Payer: Self-pay

## 2022-12-13 MED ORDER — SULFAMETHOXAZOLE-TRIMETHOPRIM 800-160 MG PO TABS
1.0000 | ORAL_TABLET | Freq: Two times a day (BID) | ORAL | 0 refills | Status: AC
  Filled 2022-12-13: qty 20, 10d supply, fill #0

## 2022-12-13 MED ORDER — SULFAMETHOXAZOLE-TRIMETHOPRIM 800-160 MG PO TABS
1.0000 | ORAL_TABLET | Freq: Two times a day (BID) | ORAL | 0 refills | Status: DC
  Filled 2022-12-13: qty 20, 10d supply, fill #0

## 2022-12-27 ENCOUNTER — Telehealth: Payer: Self-pay | Admitting: *Deleted

## 2022-12-27 NOTE — Telephone Encounter (Signed)
Patient only had 13 hours of data on his 14 day ZIO XT monitor. We do have an opportunity for a free "redo" of any monitor with less than 4 days of data. If patient had an adverse reaction to the adhesive, or just does not want to repeat the test, we can have the charges and order cancelled. Please give George Howe or George Howe in monitor a call to let us know how to proceed. [phone].

## 2022-12-29 ENCOUNTER — Other Ambulatory Visit (HOSPITAL_BASED_OUTPATIENT_CLINIC_OR_DEPARTMENT_OTHER): Payer: Self-pay

## 2022-12-30 ENCOUNTER — Other Ambulatory Visit (HOSPITAL_BASED_OUTPATIENT_CLINIC_OR_DEPARTMENT_OTHER): Payer: Self-pay

## 2022-12-31 ENCOUNTER — Other Ambulatory Visit (HOSPITAL_BASED_OUTPATIENT_CLINIC_OR_DEPARTMENT_OTHER): Payer: Self-pay

## 2022-12-31 ENCOUNTER — Encounter (HOSPITAL_BASED_OUTPATIENT_CLINIC_OR_DEPARTMENT_OTHER): Payer: Self-pay

## 2022-12-31 ENCOUNTER — Other Ambulatory Visit (HOSPITAL_COMMUNITY): Payer: Self-pay

## 2022-12-31 ENCOUNTER — Other Ambulatory Visit: Payer: Self-pay | Admitting: Internal Medicine

## 2022-12-31 DIAGNOSIS — E781 Pure hyperglyceridemia: Secondary | ICD-10-CM

## 2022-12-31 MED ORDER — ATORVASTATIN CALCIUM 40 MG PO TABS
40.0000 mg | ORAL_TABLET | Freq: Every day | ORAL | 1 refills | Status: DC
  Filled 2022-12-31 – 2023-01-02 (×2): qty 90, 90d supply, fill #0

## 2023-01-02 ENCOUNTER — Other Ambulatory Visit (HOSPITAL_BASED_OUTPATIENT_CLINIC_OR_DEPARTMENT_OTHER): Payer: Self-pay

## 2023-01-02 ENCOUNTER — Other Ambulatory Visit (HOSPITAL_COMMUNITY): Payer: Self-pay

## 2023-01-02 ENCOUNTER — Other Ambulatory Visit: Payer: Self-pay

## 2023-01-10 ENCOUNTER — Encounter: Payer: Self-pay | Admitting: *Deleted

## 2023-01-10 NOTE — Progress Notes (Signed)
Patient only wore monitor 13 hours.  Attempted to contact patient for possible redo without response.  Order cancelled and [employer] contacted to cancel charges.

## 2023-01-16 ENCOUNTER — Other Ambulatory Visit (HOSPITAL_BASED_OUTPATIENT_CLINIC_OR_DEPARTMENT_OTHER): Payer: Self-pay

## 2023-01-17 ENCOUNTER — Other Ambulatory Visit: Payer: Self-pay

## 2023-01-21 ENCOUNTER — Other Ambulatory Visit (HOSPITAL_BASED_OUTPATIENT_CLINIC_OR_DEPARTMENT_OTHER): Payer: Self-pay

## 2023-01-25 ENCOUNTER — Other Ambulatory Visit (HOSPITAL_COMMUNITY): Payer: Self-pay

## 2023-01-25 ENCOUNTER — Telehealth: Payer: Self-pay

## 2023-01-25 ENCOUNTER — Encounter (HOSPITAL_BASED_OUTPATIENT_CLINIC_OR_DEPARTMENT_OTHER): Payer: Self-pay

## 2023-01-25 ENCOUNTER — Telehealth: Payer: Self-pay | Admitting: Internal Medicine

## 2023-01-25 ENCOUNTER — Other Ambulatory Visit (HOSPITAL_BASED_OUTPATIENT_CLINIC_OR_DEPARTMENT_OTHER): Payer: Self-pay

## 2023-01-25 NOTE — Telephone Encounter (Signed)
Submitted PA. Please see separate encounter for updates on determination

## 2023-01-25 NOTE — Telephone Encounter (Signed)
Pt is calling and PA  is needed for Continuous Glucose Sensor (FREESTYLE LIBRE 3 SENSOR) MISC

## 2023-01-25 NOTE — Telephone Encounter (Signed)
Pharmacy Patient Advocate Encounter   Received notification from [facility] that prior authorization for LIBRE 3 SENSOR is needed.    PA submitted on 01/25/23  Key [id number] Status is pending  Haze Rushing, CPhT Pharmacy Patient Advocate Specialist Direct Number: [phone] Fax: [phone]

## 2023-01-28 ENCOUNTER — Other Ambulatory Visit (HOSPITAL_BASED_OUTPATIENT_CLINIC_OR_DEPARTMENT_OTHER): Payer: Self-pay

## 2023-01-28 NOTE — Telephone Encounter (Signed)
Prior Authorization for [employer] 3 has been approved 01/26/23 - 01/26/24

## 2023-01-29 ENCOUNTER — Other Ambulatory Visit (HOSPITAL_BASED_OUTPATIENT_CLINIC_OR_DEPARTMENT_OTHER): Payer: Self-pay

## 2023-01-31 ENCOUNTER — Other Ambulatory Visit (HOSPITAL_BASED_OUTPATIENT_CLINIC_OR_DEPARTMENT_OTHER): Payer: Self-pay

## 2023-01-31 ENCOUNTER — Other Ambulatory Visit: Payer: Self-pay

## 2023-01-31 ENCOUNTER — Emergency Department (HOSPITAL_BASED_OUTPATIENT_CLINIC_OR_DEPARTMENT_OTHER): Payer: 59

## 2023-01-31 ENCOUNTER — Encounter (HOSPITAL_BASED_OUTPATIENT_CLINIC_OR_DEPARTMENT_OTHER): Payer: Self-pay | Admitting: Emergency Medicine

## 2023-01-31 ENCOUNTER — Inpatient Hospital Stay (HOSPITAL_BASED_OUTPATIENT_CLINIC_OR_DEPARTMENT_OTHER)
Admission: EM | Admit: 2023-01-31 | Discharge: 2023-02-15 | DRG: 439 | Disposition: A | Discharge status: Home / Self Care | Payer: 59 | Attending: Internal Medicine | Admitting: Internal Medicine

## 2023-01-31 DIAGNOSIS — K760 Fatty (change of) liver, not elsewhere classified: Secondary | ICD-10-CM | POA: Diagnosis present

## 2023-01-31 DIAGNOSIS — R7401 Elevation of levels of liver transaminase levels: Secondary | ICD-10-CM | POA: Diagnosis present

## 2023-01-31 DIAGNOSIS — R7989 Other specified abnormal findings of blood chemistry: Secondary | ICD-10-CM | POA: Diagnosis not present

## 2023-01-31 DIAGNOSIS — E781 Pure hyperglyceridemia: Secondary | ICD-10-CM | POA: Diagnosis present

## 2023-01-31 DIAGNOSIS — K8581 Other acute pancreatitis with uninfected necrosis: Secondary | ICD-10-CM | POA: Diagnosis present

## 2023-01-31 DIAGNOSIS — E876 Hypokalemia: Secondary | ICD-10-CM | POA: Diagnosis present

## 2023-01-31 DIAGNOSIS — F17210 Nicotine dependence, cigarettes, uncomplicated: Secondary | ICD-10-CM | POA: Diagnosis present

## 2023-01-31 DIAGNOSIS — Z833 Family history of diabetes mellitus: Secondary | ICD-10-CM | POA: Diagnosis not present

## 2023-01-31 DIAGNOSIS — F419 Anxiety disorder, unspecified: Secondary | ICD-10-CM | POA: Diagnosis present

## 2023-01-31 DIAGNOSIS — E871 Hypo-osmolality and hyponatremia: Secondary | ICD-10-CM | POA: Diagnosis not present

## 2023-01-31 DIAGNOSIS — E119 Type 2 diabetes mellitus without complications: Secondary | ICD-10-CM | POA: Diagnosis not present

## 2023-01-31 DIAGNOSIS — E669 Obesity, unspecified: Secondary | ICD-10-CM | POA: Diagnosis present

## 2023-01-31 DIAGNOSIS — K859 Acute pancreatitis without necrosis or infection, unspecified: Secondary | ICD-10-CM | POA: Diagnosis not present

## 2023-01-31 DIAGNOSIS — R6339 Other feeding difficulties: Secondary | ICD-10-CM | POA: Diagnosis present

## 2023-01-31 DIAGNOSIS — Z794 Long term (current) use of insulin: Secondary | ICD-10-CM | POA: Diagnosis not present

## 2023-01-31 DIAGNOSIS — K8591 Acute pancreatitis with uninfected necrosis, unspecified: Secondary | ICD-10-CM

## 2023-01-31 DIAGNOSIS — K861 Other chronic pancreatitis: Secondary | ICD-10-CM | POA: Diagnosis present

## 2023-01-31 DIAGNOSIS — Z79899 Other long term (current) drug therapy: Secondary | ICD-10-CM | POA: Diagnosis not present

## 2023-01-31 DIAGNOSIS — K8020 Calculus of gallbladder without cholecystitis without obstruction: Secondary | ICD-10-CM | POA: Diagnosis present

## 2023-01-31 DIAGNOSIS — E1165 Type 2 diabetes mellitus with hyperglycemia: Secondary | ICD-10-CM | POA: Diagnosis present

## 2023-01-31 DIAGNOSIS — G4700 Insomnia, unspecified: Secondary | ICD-10-CM | POA: Diagnosis not present

## 2023-01-31 DIAGNOSIS — R1013 Epigastric pain: Secondary | ICD-10-CM

## 2023-01-31 DIAGNOSIS — I700 Atherosclerosis of aorta: Secondary | ICD-10-CM | POA: Diagnosis present

## 2023-01-31 DIAGNOSIS — F32A Depression, unspecified: Secondary | ICD-10-CM | POA: Diagnosis present

## 2023-01-31 DIAGNOSIS — K8512 Biliary acute pancreatitis with infected necrosis: Secondary | ICD-10-CM | POA: Diagnosis not present

## 2023-01-31 DIAGNOSIS — Z6837 Body mass index (BMI) 37.0-37.9, adult: Secondary | ICD-10-CM | POA: Diagnosis not present

## 2023-01-31 DIAGNOSIS — K8689 Other specified diseases of pancreas: Secondary | ICD-10-CM

## 2023-01-31 DIAGNOSIS — R935 Abnormal findings on diagnostic imaging of other abdominal regions, including retroperitoneum: Secondary | ICD-10-CM

## 2023-01-31 DIAGNOSIS — D72829 Elevated white blood cell count, unspecified: Secondary | ICD-10-CM | POA: Diagnosis not present

## 2023-01-31 HISTORY — DX: Acute pancreatitis with uninfected necrosis, unspecified: K85.91

## 2023-01-31 LAB — GLUCOSE, CAPILLARY
Glucose-Capillary: 105 mg/dL — ABNORMAL HIGH (ref 70–99)
Glucose-Capillary: 127 mg/dL — ABNORMAL HIGH (ref 70–99)
Glucose-Capillary: 136 mg/dL — ABNORMAL HIGH (ref 70–99)
Glucose-Capillary: 148 mg/dL — ABNORMAL HIGH (ref 70–99)
Glucose-Capillary: 177 mg/dL — ABNORMAL HIGH (ref 70–99)
Glucose-Capillary: 179 mg/dL — ABNORMAL HIGH (ref 70–99)
Glucose-Capillary: 193 mg/dL — ABNORMAL HIGH (ref 70–99)
Glucose-Capillary: 196 mg/dL — ABNORMAL HIGH (ref 70–99)

## 2023-01-31 LAB — CBC
HCT: 50.2 % (ref 39.0–52.0)
Hemoglobin: 19.4 g/dL — ABNORMAL HIGH (ref 13.0–17.0)
Hemoglobin: 18.7 g/dL — ABNORMAL HIGH (ref 13.0–17.0)
MCH: 30.9 pg (ref 26.0–34.0)
MCHC: 37.3 g/dL — ABNORMAL HIGH (ref 30.0–36.0)
MCV: 83 fL (ref 80.0–100.0)
Platelets: 239 10*3/uL (ref 150–400)
Platelets: 229 10*3/uL (ref 150–400)
RBC: 6.05 MIL/uL — ABNORMAL HIGH (ref 4.22–5.81)
RDW: 12.9 % (ref 11.5–15.5)
WBC: 12.1 10*3/uL — ABNORMAL HIGH (ref 4.0–10.5)
WBC: 12.4 10*3/uL — ABNORMAL HIGH (ref 4.0–10.5)
nRBC: 0 % (ref 0.0–0.2)
nRBC: 0 % (ref 0.0–0.2)

## 2023-01-31 LAB — COMPREHENSIVE METABOLIC PANEL
ALT: 58 U/L — ABNORMAL HIGH (ref 0–44)
AST: 33 U/L (ref 15–41)
Albumin: 4.2 g/dL (ref 3.5–5.0)
Alkaline Phosphatase: 87 U/L (ref 38–126)
Anion gap: 16 — ABNORMAL HIGH (ref 5–15)
BUN: 10 mg/dL (ref 6–20)
CO2: 20 mmol/L — ABNORMAL LOW (ref 22–32)
Calcium: 8.6 mg/dL — ABNORMAL LOW (ref 8.9–10.3)
Chloride: 98 mmol/L (ref 98–111)
Creatinine, Ser: 1.08 mg/dL (ref 0.61–1.24)
GFR, Estimated: >60 mL/min (ref >60)
Glucose, Bld: 313 mg/dL — ABNORMAL HIGH (ref 70–99)
Potassium: 3.7 mmol/L (ref 3.5–5.1)
Sodium: 134 mmol/L — ABNORMAL LOW (ref 135–145)
Total Bilirubin: 1.4 mg/dL — ABNORMAL HIGH (ref 0.3–1.2)
Total Protein: 6.3 g/dL — ABNORMAL LOW (ref 6.5–8.1)

## 2023-01-31 LAB — CULTURE, BLOOD (ROUTINE X 2): Special Requests: ADEQUATE

## 2023-01-31 LAB — CREATININE, SERUM
Creatinine, Ser: 0.97 mg/dL (ref 0.61–1.24)
GFR, Estimated: >60 mL/min (ref >60)

## 2023-01-31 LAB — URINALYSIS, ROUTINE W REFLEX MICROSCOPIC
Bacteria, UA: NONE SEEN
Bilirubin Urine: NEGATIVE
Glucose, UA: >1000 mg/dL — AB
Hgb urine dipstick: NEGATIVE
Leukocytes,Ua: NEGATIVE
Nitrite: NEGATIVE
Protein, ur: 30 mg/dL — AB
Specific Gravity, Urine: >1.046 — ABNORMAL HIGH (ref 1.005–1.030)
pH: 5.5 (ref 5.0–8.0)

## 2023-01-31 LAB — CBG MONITORING, ED: Glucose-Capillary: 224 mg/dL — ABNORMAL HIGH (ref 70–99)

## 2023-01-31 LAB — LIPASE, BLOOD: Lipase: 653 U/L — ABNORMAL HIGH (ref 11–51)

## 2023-01-31 LAB — MRSA NEXT GEN BY PCR, NASAL: MRSA by PCR Next Gen: NOT DETECTED

## 2023-01-31 LAB — TRIGLYCERIDES
Triglycerides: >5000 mg/dL — ABNORMAL HIGH (ref <150)
Triglycerides: 3323 mg/dL — ABNORMAL HIGH (ref <150)

## 2023-01-31 MED ORDER — PROCHLORPERAZINE EDISYLATE 10 MG/2ML IJ SOLN
5.0000 mg | Freq: Four times a day (QID) | INTRAMUSCULAR | Status: DC | PRN
  Administered 2023-01-31 – 2023-02-04 (×4): 5 mg via INTRAVENOUS
  Filled 2023-01-31 (×5): qty 2

## 2023-01-31 MED ORDER — METRONIDAZOLE 500 MG/100ML IV SOLN
500.0000 mg | Freq: Three times a day (TID) | INTRAVENOUS | Status: DC
  Administered 2023-01-31 – 2023-02-01 (×3): 500 mg via INTRAVENOUS
  Filled 2023-01-31 (×3): qty 100

## 2023-01-31 MED ORDER — ONDANSETRON HCL 4 MG/2ML IJ SOLN
4.0000 mg | Freq: Once | INTRAMUSCULAR | Status: AC
  Administered 2023-01-31: 4 mg via INTRAVENOUS
  Filled 2023-01-31: qty 2

## 2023-01-31 MED ORDER — IOHEXOL 300 MG/ML  SOLN
100.0000 mL | Freq: Once | INTRAMUSCULAR | Status: AC | PRN
  Administered 2023-01-31: 85 mL via INTRAVENOUS

## 2023-01-31 MED ORDER — HYDROMORPHONE HCL 1 MG/ML IJ SOLN
1.0000 mg | Freq: Once | INTRAMUSCULAR | Status: AC
  Administered 2023-01-31: 1 mg via INTRAVENOUS
  Filled 2023-01-31: qty 1

## 2023-01-31 MED ORDER — POLYETHYLENE GLYCOL 3350 17 G PO PACK
17.0000 g | PACK | Freq: Every day | ORAL | Status: DC | PRN
  Administered 2023-02-05: 17 g via ORAL
  Filled 2023-01-31: qty 1

## 2023-01-31 MED ORDER — ACETAMINOPHEN 500 MG PO TABS
500.0000 mg | ORAL_TABLET | Freq: Four times a day (QID) | ORAL | Status: AC | PRN
  Administered 2023-02-02 – 2023-02-04 (×4): 500 mg via ORAL
  Filled 2023-01-31 (×4): qty 1

## 2023-01-31 MED ORDER — POTASSIUM CHLORIDE 10 MEQ/100ML IV SOLN
10.0000 meq | INTRAVENOUS | Status: AC
  Administered 2023-01-31 (×3): 10 meq via INTRAVENOUS
  Filled 2023-01-31 (×3): qty 100

## 2023-01-31 MED ORDER — SODIUM CHLORIDE 0.9 % IV SOLN: 2.0000 g | INTRAVENOUS | Status: DC

## 2023-01-31 MED ORDER — KETOROLAC TROMETHAMINE 30 MG/ML IJ SOLN
30.0000 mg | Freq: Once | INTRAMUSCULAR | Status: AC
  Administered 2023-01-31: 30 mg via INTRAVENOUS
  Filled 2023-01-31: qty 1

## 2023-01-31 MED ORDER — ONDANSETRON HCL 4 MG/2ML IJ SOLN
4.0000 mg | Freq: Once | INTRAMUSCULAR | Status: AC
  Administered 2023-01-31: 4 mg via INTRAVENOUS

## 2023-01-31 MED ORDER — ORAL CARE MOUTH RINSE: 15.0000 mL | OROMUCOSAL | Status: DC | PRN

## 2023-01-31 MED ORDER — BUPROPION HCL ER (XL) 150 MG PO TB24
150.0000 mg | ORAL_TABLET | Freq: Every day | ORAL | Status: DC
  Administered 2023-02-01 – 2023-02-15 (×15): 150 mg via ORAL
  Filled 2023-01-31 (×16): qty 1

## 2023-01-31 MED ORDER — POTASSIUM CHLORIDE CRYS ER 20 MEQ PO TBCR: 40.0000 meq | EXTENDED_RELEASE_TABLET | Freq: Once | ORAL | Status: DC

## 2023-01-31 MED ORDER — ADULT MULTIVITAMIN W/MINERALS CH
1.0000 | ORAL_TABLET | Freq: Every day | ORAL | Status: DC
  Administered 2023-02-01 – 2023-02-15 (×15): 1 via ORAL
  Filled 2023-01-31 (×15): qty 1

## 2023-01-31 MED ORDER — OXYCODONE HCL 5 MG PO TABS
5.0000 mg | ORAL_TABLET | Freq: Four times a day (QID) | ORAL | Status: AC | PRN
  Administered 2023-01-31 – 2023-02-01 (×4): 5 mg via ORAL
  Filled 2023-01-31 (×4): qty 1

## 2023-01-31 MED ORDER — SODIUM CHLORIDE 0.9 % IV BOLUS
1000.0000 mL | Freq: Once | INTRAVENOUS | Status: AC
  Administered 2023-01-31: 1000 mL via INTRAVENOUS

## 2023-01-31 MED ORDER — SODIUM CHLORIDE 0.9 % IV SOLN
2.0000 g | Freq: Once | INTRAVENOUS | Status: AC
  Administered 2023-01-31: 2 g via INTRAVENOUS
  Filled 2023-01-31: qty 20

## 2023-01-31 MED ORDER — INSULIN (MYXREDLIN) INFUSION FOR HYPERTRIGLYCERIDEMIA
0.0500 [IU]/kg/h | INTRAVENOUS | Status: DC
  Administered 2023-01-31 – 2023-02-01 (×3): 0.1 [IU]/kg/h via INTRAVENOUS
  Filled 2023-01-31 (×2): qty 100

## 2023-01-31 MED ORDER — CHLORHEXIDINE GLUCONATE CLOTH 2 % EX PADS
6.0000 | MEDICATED_PAD | Freq: Every day | CUTANEOUS | Status: DC
  Administered 2023-01-31 – 2023-02-02 (×3): 6 via TOPICAL

## 2023-01-31 MED ORDER — DEXTROSE 5 % IV SOLN: INTRAVENOUS | Status: DC

## 2023-01-31 MED ORDER — SODIUM CHLORIDE 0.9 % IV SOLN: INTRAVENOUS | Status: DC

## 2023-01-31 MED ORDER — ENOXAPARIN SODIUM 40 MG/0.4ML IJ SOSY
40.0000 mg | PREFILLED_SYRINGE | INTRAMUSCULAR | Status: DC
  Administered 2023-01-31 – 2023-02-01 (×2): 40 mg via SUBCUTANEOUS
  Filled 2023-01-31 (×2): qty 0.4

## 2023-01-31 MED ORDER — INSULIN REGULAR(HUMAN) IN NACL 100-0.9 UT/100ML-% IV SOLN
INTRAVENOUS | Status: DC
  Administered 2023-01-31: 10.5 [IU]/h via INTRAVENOUS
  Filled 2023-01-31: qty 100

## 2023-01-31 MED ORDER — PROCHLORPERAZINE EDISYLATE 10 MG/2ML IJ SOLN
5.0000 mg | INTRAMUSCULAR | Status: AC
  Administered 2023-01-31: 5 mg via INTRAVENOUS
  Filled 2023-01-31: qty 2

## 2023-01-31 MED ORDER — HYDROMORPHONE HCL 1 MG/ML IJ SOLN
1.0000 mg | INTRAMUSCULAR | Status: DC | PRN
  Administered 2023-01-31 – 2023-02-01 (×7): 1 mg via INTRAVENOUS
  Filled 2023-01-31 (×7): qty 1

## 2023-01-31 MED ORDER — DEXTROSE 50 % IV SOLN: 0.0000 mL | INTRAVENOUS | Status: DC | PRN

## 2023-01-31 MED ORDER — HYDROMORPHONE HCL 1 MG/ML IJ SOLN
0.5000 mg | INTRAMUSCULAR | Status: AC
  Administered 2023-01-31: 0.5 mg via INTRAVENOUS
  Filled 2023-01-31: qty 1

## 2023-01-31 NOTE — Plan of Care (Signed)
Plan of Care Note for Accepted Transfer   Patient: George Howe    XBJ:[id number]     Facility requesting transfer: [facility] Requesting Provider: Dr. 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 Found to have pancreatitis, lipase 653. Triglycerides pending but looked lipemic per Dr. Renaye Rakers. Also starting insulin drip for suspected hypertriglyceridemia and early possible DKA. Had some shaking chills so started on empiric rocephin and flagyl as well.  Most recent vitals, labs and radiology:  Blood pressure (!) 158/86, pulse 86, temperature 99.5 F (37.5 C), temperature source Oral, resp. rate 20, height 5\' 2"  (1.575 m), weight 97.5 kg, SpO2 98 %.      Latest Ref Rng & Units 01/31/2023    7:29 AM 09/22/2022    9:58 PM 04/30/2022    1:29 PM  CBC  WBC 4.0 - 10.5 K/uL 12.1  10.8  21.4   Hemoglobin 13.0 - 17.0 g/dL 30.8  65.7  84.6   Hematocrit 39.0 - 52.0 % RESULTS UNAVAILABLE DUE TO INTERFERING SUBSTANCE  49.4  46.6   Platelets 150 - 400 K/uL 239  263  297       Latest Ref Rng & Units 01/31/2023    7:29 AM 09/22/2022    9:58 PM 04/30/2022    1:29 PM  BMP  Glucose 70 - 99 mg/dL 962  952  841   BUN 6 - 20 mg/dL 10  19  13    Creatinine 0.61 - 1.24 mg/dL 3.24  4.01  0.27   Sodium 135 - 145 mmol/L 134  137  132   Potassium 3.5 - 5.1 mmol/L 3.7  4.1  4.4   Chloride 98 - 111 mmol/L 98  101  99   CO2 22 - 32 mmol/L 20  26  21    Calcium 8.9 - 10.3 mg/dL 8.6  9.3  9.4      CT ABDOMEN PELVIS W CONTRAST  Result Date: 01/31/2023 CLINICAL DATA:  Pancreatitis, acute, severe. EXAM: CT ABDOMEN AND PELVIS WITH CONTRAST TECHNIQUE: Multidetector CT imaging of the abdomen and pelvis was performed using the standard protocol following bolus administration of intravenous contrast. RADIATION DOSE REDUCTION: This exam was performed according to the departmental dose-optimization program which includes automated exposure control, adjustment of the mA and/or kV  according to patient size and/or use of iterative reconstruction technique. CONTRAST:  85mL OMNIPAQUE IOHEXOL 300 MG/ML  SOLN COMPARISON:  CT abdomen/pelvis 04/30/2022. FINDINGS: Lower chest: No acute abnormality. Hepatobiliary: Heterogeneous attenuation in the liver, suggestive of heterogeneous hepatic steatosis. Layering hyperdensity in the gallbladder neck may reflect sludge or small stones. No biliary dilatation. Pancreas: Moderate peripancreatic fat stranding. Focal hypoattenuation of the pancreatic tail, suspicious for necrosis (axial image 25 series 2). Spleen: Normal. Adrenals/Urinary Tract: Adrenal glands are unremarkable. Kidneys are normal, without renal calculi, focal lesion, or hydronephrosis. Bladder is unremarkable. Stomach/Bowel: Moderate fat stranding around the second and third portions of the duodenum. No dilated loops of small bowel. Normal appendix is visualized on axial image 59 series 2. No colon wall thickening or surrounding inflammation. Vascular/Lymphatic: Aortic atherosclerosis. No enlarged abdominal or pelvic lymph nodes. Reproductive: Prostate is unremarkable. Other: No abdominal wall hernia or abnormality. No abdominopelvic ascites. Musculoskeletal: No acute or significant osseous findings. IMPRESSION: 1. Moderate peripancreatic fat stranding, consistent with acute pancreatitis. Focal hypoattenuation of the pancreatic tail, suspicious for necrosis. 2. Heterogeneous attenuation of the liver, suggestive of heterogeneous hepatic steatosis. 3. Layering hyperdensity in the gallbladder neck may  reflect sludge or small stones. No biliary dilatation. 4. Aortic Atherosclerosis (ICD10-I70.0). Electronically Signed   By: Orvan Falconer M.D.   On: 01/31/2023 10:57    The patient has been accepted for transfer to Step down unit at [facility] or [facility], depending on bed and resource availability. The patient will remain under the care and responsibility of the referring  provider until they have arrived to our inpatient facility.  Author: Charlise Giovanetti Sharlette Dense, MD  01/31/2023  Check [email or web address] for on-call coverage.  Nursing staff, Please call [facility] Admits & Consults System-Wide number on Amion as soon as patient's arrival, so appropriate admitting provider can evaluate the pt.

## 2023-01-31 NOTE — ED Triage Notes (Signed)
Pt arrives to ED with c/o LUQ abdominal pain that started this morning.

## 2023-01-31 NOTE — ED Notes (Signed)
George Howe with cl called for transport 

## 2023-01-31 NOTE — H&P (Addendum)
History and Physical  George Howe ZOX:[id number] DOB: [birth year] DOA: 01/31/2023  Referring physician: Accepted by Dr. Kirby Crigler, [facility], Hospital Medicine. PCP: [name], [name], MD  Outpatient Specialists: None Patient coming from: Home through [facility] ED  Chief Complaint: Abdominal pain, nausea and vomiting.   HPI: George Howe is a 37 y.o. male with medical history significant for obesity, type 2 diabetes, hypertriglyceridemia on Lipitor 40 mg nightly, prior hypertriglyceridemia induced pancreatitis x 3, chronic anxiety/depression, who initially presented to [facility] ED with complaints of diffuse abdominal pain for the past few days, worse at the epigastric region and radiating to his back.  Associated with nausea and vomiting.  No reported subjective fevers or chills.  In the ED, vital signs notable for elevated BP, lab studies remarkable for triglycerides greater than 5000, lipase 653, serum glucose 313, serum bicarb 20, anion gap 16.   CT abdomen and pelvis with contrast revealed the following findings: 1. Moderate peripancreatic fat stranding, consistent with acute pancreatitis. Focal hypoattenuation of the pancreatic tail, suspicious for necrosis. 2. Heterogeneous attenuation of the liver, suggestive of heterogeneous hepatic steatosis. 3. Layering hyperdensity in the gallbladder neck may reflect sludge or small stones. No biliary dilatation. 4. Aortic Atherosclerosis (ICD10-I70.0).  The patient was started on insulin drip.  Rocephin and IV Flagyl were added due to possibility of pancreatic tail necrosis.  Admitted by [facility], and transferred to [facility] stepdown unit as inpatient status.  ED Course: Temperature 97.7.  BP 179/107, pulse 98, respiration rate 14, O2 saturation 96% on room air.  Lab studies remarkable for WBC 12.1.  Hemoglobin 19.4.  Platelet count 239.  Review of Systems: Review of systems as noted in the HPI. All other systems reviewed and are  negative.   Past Medical History:  Diagnosis Date   Chicken pox    Depression    Diabetes mellitus without complication (HCC)    DM (diabetes mellitus) (HCC)    Hay fever    Hyperlipidemia    Hyperlipidemia    Morbid obesity (HCC)    PONV (postoperative nausea and vomiting)    Past Surgical History:  Procedure Laterality Date   CARPAL TUNNEL RELEASE Right 10/18/2021   Procedure: Right CARPAL TUNNEL RELEASE;  Surgeon: Marlyne Beards, MD;  Location: [facility];  Service: Orthopedics;  Laterality: Right;   CARPAL TUNNEL RELEASE Left 11/01/2021   Procedure: Left CARPAL TUNNEL RELEASE;  Surgeon: Marlyne Beards, MD;  Location: [facility] OR;  Service: Orthopedics;  Laterality: Left;    Social History:  reports that he has been smoking cigarettes. He has a 17.00 pack-year smoking history. He has never used smokeless tobacco. He reports current alcohol use. He reports current drug use. Drug: Marijuana.   No Known Allergies  Family History  Problem Relation Age of Onset   Diabetes Father    Diabetes Paternal Grandmother       Prior to Admission medications   Medication Sig Start Date End Date Taking? Authorizing Provider  atorvastatin (LIPITOR) 40 MG tablet Take 1 tablet (40 mg total) by mouth daily. 12/31/22  Yes [name], [name], MD  buPROPion (WELLBUTRIN XL) 150 MG 24 hr tablet Take 1 tablet (150 mg total) by mouth daily. 10/01/22  Yes [name], [name], MD  Continuous Glucose Sensor (FREESTYLE LIBRE 3 SENSOR) MISC Place 1 sensor on the skin every 14 days. Use to check glucose continuously 10/01/22  Yes [name], [name], MD  insulin aspart (NOVOLOG FLEXPEN) 100 UNIT/ML FlexPen Inject 4  units with each meal. Additionally, add 3 units for each 40 points above 140. 10/10/22  Yes [name], [name], MD  Insulin Glargine [facility]) 100 UNIT/ML Inject 33 Units into the skin daily. 10/29/22  Yes [name], [name], MD  Multiple  Vitamins-Minerals (MULTIVITAMIN WITH MINERALS) tablet Take 1 tablet by mouth daily.   Yes [provider]  cetirizine (ZYRTEC) 10 MG tablet Take 1 tablet (10 mg total) by mouth daily as needed for allergies. 12/19/21   [name], [name], MD  Insulin Pen Needle (PEN NEEDLES) 32G X 4 MM MISC Use 1 pen needle to inject insulin 4 times per day. 12/03/22   [name], [name], MD  sertraline (ZOLOFT) 100 MG tablet Take 1 tablet (100 mg total) by mouth daily. 08/15/22   [name], [name], MD    Physical Exam: BP (!) 179/107 (BP Location: Right Arm)   Pulse 99   Temp 97.7 F (36.5 C) (Oral)   Resp 14   Ht 5\' 4"  (1.626 m)   Wt 99.6 kg   SpO2 96%   BMI 37.69 kg/m   General: 37 y.o. year-old male well developed well nourished in no acute distress.  Alert and oriented x3. Cardiovascular: Regular rate and rhythm with no rubs or gallops.  No thyromegaly or JVD noted.  No lower extremity edema. 2/4 pulses in all 4 extremities. Respiratory: Clear to auscultation with no wheezes or rales. Good inspiratory effort. Abdomen: Soft with diffuse tenderness on mild palpation worse at epigastric region.  Nondistended with normal bowel sounds x4 quadrants. Muskuloskeletal: No cyanosis, clubbing or edema noted bilaterally Neuro: CN II-XII intact, strength, sensation, reflexes Skin: No ulcerative lesions noted or rashes Psychiatry: Judgement and insight appear normal. Mood is appropriate for condition and setting          Labs on Admission:  Basic Metabolic Panel: Recent Labs  Lab 01/31/23 0729  NA 134*  K 3.7  CL 98  CO2 20*  GLUCOSE 313*  BUN 10  CREATININE 1.08  CALCIUM 8.6*   Liver Function Tests: Recent Labs  Lab 01/31/23 0729  AST 33  ALT 58*  ALKPHOS 87  BILITOT 1.4*  PROT 6.3*  ALBUMIN 4.2   Recent Labs  Lab 01/31/23 0729  LIPASE 653*   No results for input(s): "AMMONIA" in the last 168 hours. CBC: Recent Labs  Lab 01/31/23 0729  WBC 12.1*   HGB 19.4*  HCT RESULTS UNAVAILABLE DUE TO INTERFERING SUBSTANCE  MCV RESULTS UNAVAILABLE DUE TO INTERFERING SUBSTANCE  PLT 239   Cardiac Enzymes: No results for input(s): "CKTOTAL", "CKMB", "CKMBINDEX", "TROPONINI" in the last 168 hours.  BNP (last 3 results) No results for input(s): "BNP" in the last 8760 hours.  ProBNP (last 3 results) No results for input(s): "PROBNP" in the last 8760 hours.  CBG: Recent Labs  Lab 01/31/23 1254 01/31/23 1505  GLUCAP 224* 196*    Radiological Exams on Admission: CT ABDOMEN PELVIS W CONTRAST  Result Date: 01/31/2023 CLINICAL DATA:  Pancreatitis, acute, severe. EXAM: CT ABDOMEN AND PELVIS WITH CONTRAST TECHNIQUE: Multidetector CT imaging of the abdomen and pelvis was performed using the standard protocol following bolus administration of intravenous contrast. RADIATION DOSE REDUCTION: This exam was performed according to the departmental dose-optimization program which includes automated exposure control, adjustment of the mA and/or kV according to patient size and/or use of iterative reconstruction technique. CONTRAST:  85mL OMNIPAQUE IOHEXOL 300 MG/ML  SOLN COMPARISON:  CT abdomen/pelvis 04/30/2022. FINDINGS: Lower chest:  No acute abnormality. Hepatobiliary: Heterogeneous attenuation in the liver, suggestive of heterogeneous hepatic steatosis. Layering hyperdensity in the gallbladder neck may reflect sludge or small stones. No biliary dilatation. Pancreas: Moderate peripancreatic fat stranding. Focal hypoattenuation of the pancreatic tail, suspicious for necrosis (axial image 25 series 2). Spleen: Normal. Adrenals/Urinary Tract: Adrenal glands are unremarkable. Kidneys are normal, without renal calculi, focal lesion, or hydronephrosis. Bladder is unremarkable. Stomach/Bowel: Moderate fat stranding around the second and third portions of the duodenum. No dilated loops of small bowel. Normal appendix is visualized on axial image 59 series 2. No colon wall  thickening or surrounding inflammation. Vascular/Lymphatic: Aortic atherosclerosis. No enlarged abdominal or pelvic lymph nodes. Reproductive: Prostate is unremarkable. Other: No abdominal wall hernia or abnormality. No abdominopelvic ascites. Musculoskeletal: No acute or significant osseous findings. IMPRESSION: 1. Moderate peripancreatic fat stranding, consistent with acute pancreatitis. Focal hypoattenuation of the pancreatic tail, suspicious for necrosis. 2. Heterogeneous attenuation of the liver, suggestive of heterogeneous hepatic steatosis. 3. Layering hyperdensity in the gallbladder neck may reflect sludge or small stones. No biliary dilatation. 4. Aortic Atherosclerosis (ICD10-I70.0). Electronically Signed   By: Orvan Falconer M.D.   On: 01/31/2023 10:57    EKG: I independently viewed the EKG done and my findings are as followed: None available at the time of this visit.  Assessment/Plan Present on Admission:  Acute pancreatitis  Active Problems:   Acute pancreatitis  Hypertriglyceridemia induced acute pancreatitis Triglyceride level greater than 5000 Lipase greater than 600 Nausea vomiting epigastric pain radiating to the back. CT abdomen pelvis with contrast revealed moderate peripancreatic fat stranding consistent with acute pancreatitis.  Focal hypoattenuation of the pancreatic tail suspicious for necrosis. IV insulin for hypertriglyceridemia induced pancreatitis protocol with goal of reduction of serum triglyceride level to less than 500 mg/dL Monitor triglyceride level every 12 hours. CBG every 1 hour, adjust insulin/D5 infusion accordingly. Stop IV insulin when triglyceride levels are less than 500. Will continue Rocephin and IV Flagyl due to possibility of necrosis. Follow peripheral blood cultures. General surgery consulted to assist with the management IV analgesics as needed IV antiemetics as needed  Sepsis secondary to possible pancreatic tail necrosis,  POA Leukocytosis WBC 12.1, tachycardia heart rate 115, tachypnea respiratory 32, possible pancreatic tail necrosis, seen on CT scan Currently on Rocephin and IV Flagyl Follow peripheral blood cultures x 2 General surgery consulted Monitor fever curve and WBC Closely monitor vital signs  Hypertriglyceridemia The patient on Lipitor 40 mg nightly and compliant Insulin drip per hypertriglyceridemia protocol Monitor triglyceride levels every 12 hours  Type 2 diabetes with hyperglycemia Prior to admission on NovoLog 4 units 3 times daily and long-acting 33 units nightly Obtain hemoglobin A1c Insulin drip per hypertriglyceridemia induced pancreatitis protocol Diabetic coordinator has been consulted appreciate assistance.  Elevated blood pressure, no history of hypertension Suspect secondary to pain Control pain, pain management in place Monitor vital signs  Chronic anxiety/depression Resume home regimen.  Possible heterogeneous hepatic steatosis Obesity BMI 37 Recommend weight loss outpatient with regular physical activity and healthy dieting.  Elevated liver chemistries in the setting of possible hepatic steatosis ALT 58, T. bili 1.4 Monitor for now.    DVT prophylaxis: Subcu Lovenox daily  Code Status: Full code  Family Communication: None at bedside  Disposition Plan: Admitted to stepdown unit  Consults called: General surgery  Admission status: Inpatient status.   Status is: Inpatient The patient requires at least 2 midnights for further evaluation and treatment of present condition.   Darlin Drop MD  [facility] Pager [phone]  If 7PM-7AM, please contact night-coverage [email or web address] Password [facility]  01/31/2023, 3:44 PM

## 2023-01-31 NOTE — ED Provider Notes (Signed)
[place] EMERGENCY DEPARTMENT AT [facility] Provider Note   CSN: [id number] Arrival date & time: 01/31/23  0865     History  Chief Complaint  Patient presents with   Abdominal Pain    George Howe is a 37 y.o. male w/ hx of pancreatitis presenting to ED with abdominal pain.  Patient reports he woke up this morning with epigastric and LUQ abdominal pain.  Feels like his prior pancreatitis episodes.  +Nausea, no vomiting, regular BM, no diarrhea.  Pain has been constant.  He denies any history of abdominal surgery.  He denies dysuria or history of kidney stones.  He is in significant discomfort.  HPI     Home Medications Prior to Admission medications   Medication Sig Start Date End Date Taking? Authorizing Provider  atorvastatin (LIPITOR) 40 MG tablet Take 1 tablet (40 mg total) by mouth daily. 12/31/22  Yes [name], [name], MD  buPROPion (WELLBUTRIN XL) 150 MG 24 hr tablet Take 1 tablet (150 mg total) by mouth daily. 10/01/22  Yes [name], [name], MD  Continuous Glucose Sensor (FREESTYLE LIBRE 3 SENSOR) MISC Place 1 sensor on the skin every 14 days. Use to check glucose continuously 10/01/22  Yes [name], [name], MD  insulin aspart (NOVOLOG FLEXPEN) 100 UNIT/ML FlexPen Inject 4 units with each meal. Additionally, add 3 units for each 40 points above 140. 10/10/22  Yes [name], [name], MD  Insulin Glargine [facility]) 100 UNIT/ML Inject 33 Units into the skin daily. 10/29/22  Yes [name], [name], MD  Multiple Vitamins-Minerals (MULTIVITAMIN WITH MINERALS) tablet Take 1 tablet by mouth daily.   Yes [provider]  cetirizine (ZYRTEC) 10 MG tablet Take 1 tablet (10 mg total) by mouth daily as needed for allergies. 12/19/21   [name], [name], MD  Insulin Pen Needle (PEN NEEDLES) 32G X 4 MM MISC Use 1 pen needle to inject insulin 4 times per day. 12/03/22   [name], [name], MD  sertraline  (ZOLOFT) 100 MG tablet Take 1 tablet (100 mg total) by mouth daily. 08/15/22   [name], [name], MD      Allergies    Patient has no known allergies.    Review of Systems   Review of Systems  Physical Exam Updated Vital Signs BP (!) 145/110   Pulse (!) 109   Temp 97.7 F (36.5 C) (Oral)   Resp (!) 34   Ht 5\' 4"  (1.626 m)   Wt 99.6 kg   SpO2 94%   BMI 37.69 kg/m  Physical Exam Constitutional:      General: He is not in acute distress. HENT:     Head: Normocephalic and atraumatic.  Eyes:     Conjunctiva/sclera: Conjunctivae normal.     Pupils: Pupils are equal, round, and reactive to light.  Cardiovascular:     Rate and Rhythm: Normal rate and regular rhythm.  Pulmonary:     Effort: Pulmonary effort is normal. No respiratory distress.  Abdominal:     General: There is no distension.     Tenderness: There is abdominal tenderness in the epigastric area and left upper quadrant. Negative signs include Murphy's sign.  Skin:    General: Skin is warm and dry.  Neurological:     General: No focal deficit present.     Mental Status: He is alert. Mental status is at baseline.  Psychiatric:        Mood and Affect: Mood  normal.        Behavior: Behavior normal.     ED Results / Procedures / Treatments   Labs (all labs ordered are listed, but only abnormal results are displayed) Labs Reviewed  LIPASE, BLOOD - Abnormal; Notable for the following components:      Result Value   Lipase 653 (*)    All other components within normal limits  COMPREHENSIVE METABOLIC PANEL - Abnormal; Notable for the following components:   Sodium 134 (*)    CO2 20 (*)    Glucose, Bld 313 (*)    Calcium 8.6 (*)    Total Protein 6.3 (*)    ALT 58 (*)    Total Bilirubin 1.4 (*)    Anion gap 16 (*)    All other components within normal limits  CBC - Abnormal; Notable for the following components:   WBC 12.1 (*)    Hemoglobin 19.4 (*)    All other components within normal limits   URINALYSIS, ROUTINE W REFLEX MICROSCOPIC - Abnormal; Notable for the following components:   Specific Gravity, Urine >1.046 (*)    Glucose, UA >1,000 (*)    Ketones, ur TRACE (*)    Protein, ur 30 (*)    All other components within normal limits  TRIGLYCERIDES - Abnormal; Notable for the following components:   Triglycerides >5,000 (*)    All other components within normal limits  GLUCOSE, CAPILLARY - Abnormal; Notable for the following components:   Glucose-Capillary 196 (*)    All other components within normal limits  GLUCOSE, CAPILLARY - Abnormal; Notable for the following components:   Glucose-Capillary 193 (*)    All other components within normal limits  CBG MONITORING, ED - Abnormal; Notable for the following components:   Glucose-Capillary 224 (*)    All other components within normal limits  MRSA NEXT GEN BY PCR, NASAL  TRIGLYCERIDES  TRIGLYCERIDES  CBC  COMPREHENSIVE METABOLIC PANEL  LIPASE, BLOOD  MAGNESIUM  PHOSPHORUS  HIV ANTIBODY (ROUTINE TESTING W REFLEX)  HEMOGLOBIN A1C    EKG None  Radiology CT ABDOMEN PELVIS W CONTRAST  Result Date: 01/31/2023 CLINICAL DATA:  Pancreatitis, acute, severe. EXAM: CT ABDOMEN AND PELVIS WITH CONTRAST TECHNIQUE: Multidetector CT imaging of the abdomen and pelvis was performed using the standard protocol following bolus administration of intravenous contrast. RADIATION DOSE REDUCTION: This exam was performed according to the departmental dose-optimization program which includes automated exposure control, adjustment of the mA and/or kV according to patient size and/or use of iterative reconstruction technique. CONTRAST:  85mL OMNIPAQUE IOHEXOL 300 MG/ML  SOLN COMPARISON:  CT abdomen/pelvis 04/30/2022. FINDINGS: Lower chest: No acute abnormality. Hepatobiliary: Heterogeneous attenuation in the liver, suggestive of heterogeneous hepatic steatosis. Layering hyperdensity in the gallbladder neck may reflect sludge or small stones. No  biliary dilatation. Pancreas: Moderate peripancreatic fat stranding. Focal hypoattenuation of the pancreatic tail, suspicious for necrosis (axial image 25 series 2). Spleen: Normal. Adrenals/Urinary Tract: Adrenal glands are unremarkable. Kidneys are normal, without renal calculi, focal lesion, or hydronephrosis. Bladder is unremarkable. Stomach/Bowel: Moderate fat stranding around the second and third portions of the duodenum. No dilated loops of small bowel. Normal appendix is visualized on axial image 59 series 2. No colon wall thickening or surrounding inflammation. Vascular/Lymphatic: Aortic atherosclerosis. No enlarged abdominal or pelvic lymph nodes. Reproductive: Prostate is unremarkable. Other: No abdominal wall hernia or abnormality. No abdominopelvic ascites. Musculoskeletal: No acute or significant osseous findings. IMPRESSION: 1. Moderate peripancreatic fat stranding, consistent with acute pancreatitis. Focal hypoattenuation of  the pancreatic tail, suspicious for necrosis. 2. Heterogeneous attenuation of the liver, suggestive of heterogeneous hepatic steatosis. 3. Layering hyperdensity in the gallbladder neck may reflect sludge or small stones. No biliary dilatation. 4. Aortic Atherosclerosis (ICD10-I70.0). Electronically Signed   By: Orvan Falconer M.D.   On: 01/31/2023 10:57    Procedures .Critical Care  Performed by: Terald Sleeper, MD Authorized by: Terald Sleeper, MD   Critical care provider statement:    Critical care time (minutes):  45   Critical care time was exclusive of:  Separately billable procedures and treating other patients   Critical care was necessary to treat or prevent imminent or life-threatening deterioration of the following conditions:  Metabolic crisis   Critical care was time spent personally by me on the following activities:  Ordering and performing treatments and interventions, ordering and review of laboratory studies, ordering and review of radiographic  studies, pulse oximetry, review of old charts, examination of patient and evaluation of patient's response to treatment Comments:     Insulin infusion for hyperglycemia type 1 diabetes as well as hypertriglyceridemia with pancreatitis     Medications Ordered in ED Medications  HYDROmorphone (DILAUDID) injection 1 mg (1 mg Intravenous Given 01/31/23 1246)  metroNIDAZOLE (FLAGYL) IVPB 500 mg (0 mg Intravenous Stopped 01/31/23 1417)  potassium chloride SA (KLOR-CON M) CR tablet 40 mEq (40 mEq Oral Not Given 01/31/23 1516)  potassium chloride 10 mEq in 100 mL IVPB (10 mEq Intravenous New Bag/Given 01/31/23 1629)  Chlorhexidine Gluconate Cloth 2 % PADS 6 each (6 each Topical Given 01/31/23 1509)  enoxaparin (LOVENOX) injection 40 mg (has no administration in time range)  insulin (MYXREDLIN) 100 units/100 mL infusion for hypertriglyceridemia-induced pancreatitis (0.1 Units/kg/hr  99.6 kg Intravenous Infusion Verify 01/31/23 1647)  dextrose 5 % solution ( Intravenous Infusion Verify 01/31/23 1647)  cefTRIAXone (ROCEPHIN) 2 g in sodium chloride 0.9 % 100 mL IVPB (has no administration in time range)  0.9 %  sodium chloride infusion ( Intravenous Infusion Verify 01/31/23 1647)  acetaminophen (TYLENOL) tablet 500 mg (has no administration in time range)  oxyCODONE (Oxy IR/ROXICODONE) immediate release tablet 5 mg (has no administration in time range)  buPROPion (WELLBUTRIN XL) 24 hr tablet 150 mg (has no administration in time range)  multivitamin with minerals tablet 1 tablet (has no administration in time range)  prochlorperazine (COMPAZINE) injection 5 mg (has no administration in time range)  polyethylene glycol (MIRALAX / GLYCOLAX) packet 17 g (has no administration in time range)  HYDROmorphone (DILAUDID) injection 1 mg (1 mg Intravenous Given 01/31/23 0759)  ondansetron (ZOFRAN) injection 4 mg (4 mg Intravenous Given 01/31/23 0758)  ketorolac (TORADOL) 30 MG/ML injection 30 mg (30 mg Intravenous Given 01/31/23  0759)  sodium chloride 0.9 % bolus 1,000 mL (0 mLs Intravenous Stopped 01/31/23 1235)  HYDROmorphone (DILAUDID) injection 1 mg (1 mg Intravenous Given 01/31/23 0935)  iohexol (OMNIPAQUE) 300 MG/ML solution 100 mL (85 mLs Intravenous Contrast Given 01/31/23 1035)  sodium chloride 0.9 % bolus 1,000 mL (1,000 mLs Intravenous New Bag/Given 01/31/23 1235)  cefTRIAXone (ROCEPHIN) 2 g in sodium chloride 0.9 % 100 mL IVPB (0 g Intravenous Stopped 01/31/23 1313)  ondansetron (ZOFRAN) injection 4 mg (4 mg Intravenous Given 01/31/23 1243)  HYDROmorphone (DILAUDID) injection 0.5 mg (0.5 mg Intravenous Given 01/31/23 1557)  prochlorperazine (COMPAZINE) injection 5 mg (5 mg Intravenous Given 01/31/23 1557)    ED Course/ Medical Decision Making/ A&P Clinical Course as of 01/31/23 1702  Thu Jan 31, 2023  1610  Blood was lipemic, needing to be sent to [facility] - based on patient's prior normal Cr checks, I think CT imaging without labs would still be reasonable to rapidly evaluate for emergent processes - given the expected lab delays. [MT]  1211 Patient reporting is having cold chills now.  IV antibiotics ordered empirically for potential infected necrosis.  No abscess visible [MT]  1251 Admitted to hospitalist, started on insulin infusion for suspected hypertriglyceridemia as well as elevated blood sugars and type 1 diabetes [MT]    Clinical Course User Index [MT] Christna Kulick, Kermit Balo, MD                             Medical Decision Making Amount and/or Complexity of Data Reviewed Labs: ordered. Radiology: ordered.  Risk OTC drugs. Prescription drug management. Decision regarding hospitalization.   This patient presents to the ED with concern for abdominal pain. This involves an extensive number of treatment options, and is a complaint that carries with it a high risk of complications and morbidity.  The differential diagnosis includes gastritis vs pancreatitis vs SBO vs ureteral colic vs other  Co-morbidities that  complicate the patient evaluation: hx of diabetes, pancreatitis  External records from outside source obtained and reviewed including hx of pancreatitis, hospitalization records inc discharge summary - felt to be 2/2 hypertryglycedemia  I ordered and personally interpreted labs.  The pertinent results include:  Glucose 313, Lipase 653, Triglyercides pending at the time of admission, WBC 12.1.  I ordered imaging studies including CT abdomen pelvis I independently visualized and interpreted imaging which showed gallbladder sludge, pancreatic inflammation and pancreatic tail potential necrosis, hepatitic steatosis I agree with the radiologist interpretation  The patient was maintained on a cardiac monitor.  I personally viewed and interpreted the cardiac monitored which showed an underlying rhythm of: sinus tachycardia   I ordered medication including IV pain and nausea medications, IV antibiotics, IV insulin infusion  I have reviewed the patients home medicines and have made adjustments as needed  Test Considered: doubt acute PE, ACS, aortic dissection clinically  After the interventions noted above, I reevaluated the patient and found that they have: stayed the same   Dispostion:  After consideration of the diagnostic results and the patients response to treatment, I feel that the patent would benefit from medical admission.         Final Clinical Impression(s) / ED Diagnoses Final diagnoses:  Acute pancreatitis, unspecified complication status, unspecified pancreatitis type    Rx / DC Orders ED Discharge Orders     None         Terald Sleeper, MD 01/31/23 [id number]

## 2023-01-31 NOTE — ED Notes (Signed)
Pt unable to urinate at this time.  

## 2023-01-31 NOTE — Inpatient Diabetes Management (Signed)
Inpatient Diabetes Program Recommendations  AACE/ADA: New Consensus Statement on Inpatient Glycemic Control (2015)  Target Ranges:  Prepandial:   less than 140 mg/dL      Peak postprandial:   less than 180 mg/dL (1-2 hours)      Critically ill patients:  140 - 180 mg/dL   Lab Results  Component Value Date   GLUCAP 196 (H) 01/31/2023   HGBA1C 8.9 (A) 10/01/2022    Review of Glycemic Control:   Latest Reference Range & Units 01/31/23 12:54 01/31/23 15:05  Glucose-Capillary 70 - 99 mg/dL 161 (H) 096 (H)   Diabetes history: DM2 Outpatient Diabetes medications:  Novolog 4 units tid with meals Basaglar 33 units daily Current orders for Inpatient glycemic control:  IV insulin/Endotool  Inpatient Diabetes Program Recommendations:    Note that patient with high triglycerides>5000. Recommend use of IV insulin for high triglycerides instead.  Notified MD and orders changed.  Will follow.   Thanks,  Beryl Meager, RN, BC-ADM Inpatient Diabetes Coordinator Pager [phone]  (8a-5p)

## 2023-01-31 NOTE — ED Notes (Signed)
[facility] long nurse to return call for report

## 2023-01-31 NOTE — Consult Note (Addendum)
George Howe 12/10/1985  [id number].    Requesting MD: Dr. Dow Adolph Chief Complaint/Reason for Consult: pancreatitis with necrosis  HPI:  George Howe is a 37 yo male with T2DM and hypertriglyceridemia and prior episodes of acute pancreatitis secondary to hypertriglyceridemia, who presented to the ED with acute abdominal pain radiating to the back. He has also had nausea and vomiting. His pain began when he woke up yesterday morning around 7am. It is mostly in the LUQ.   Labs in the ED were significant for a triglyceride level >5000. Lipase was 653. A CT scan showed peripancreatic stranding, as well as concern for developing necrosis in the tail of the pancreas. The patient was admitted to the stepdown unit at [facility]. General surgery was consulted. This morning TG are downtrending. Hgb is up to 18. CMP is pending. He has not urinated since yesterday afternoon.  ROS: Review of Systems  Constitutional:  Negative for chills and fever.  Gastrointestinal:  Positive for abdominal pain, nausea and vomiting.    Family History  Problem Relation Age of Onset   Diabetes Father    Diabetes Paternal Grandmother     Past Medical History:  Diagnosis Date   Chicken pox    Depression    Diabetes mellitus without complication (HCC)    DM (diabetes mellitus) (HCC)    Hay fever    Hyperlipidemia    Hyperlipidemia    Morbid obesity (HCC)    PONV (postoperative nausea and vomiting)     Past Surgical History:  Procedure Laterality Date   CARPAL TUNNEL RELEASE Right 10/18/2021   Procedure: Right CARPAL TUNNEL RELEASE;  Surgeon: Marlyne Beards, MD;  Location: [facility];  Service: Orthopedics;  Laterality: Right;   CARPAL TUNNEL RELEASE Left 11/01/2021   Procedure: Left CARPAL TUNNEL RELEASE;  Surgeon: Marlyne Beards, MD;  Location: [facility] OR;  Service: Orthopedics;  Laterality: Left;    Social History:  reports that he has been smoking cigarettes. He has a 17.00 pack-year smoking  history. He has never used smokeless tobacco. He reports current alcohol use. He reports current drug use. Drug: Marijuana.  Allergies: No Known Allergies  Medications Prior to Admission  Medication Sig Dispense Refill   atorvastatin (LIPITOR) 40 MG tablet Take 1 tablet (40 mg total) by mouth daily. 90 tablet 1   buPROPion (WELLBUTRIN XL) 150 MG 24 hr tablet Take 1 tablet (150 mg total) by mouth daily. 90 tablet 1   Continuous Glucose Sensor (FREESTYLE LIBRE 3 SENSOR) MISC Place 1 sensor on the skin every 14 days. Use to check glucose continuously 2 each 3   insulin aspart (NOVOLOG FLEXPEN) 100 UNIT/ML FlexPen Inject 4 units with each meal. Additionally, add 3 units for each 40 points above 140. 12 mL 6   Insulin Glargine (BASAGLAR KWIKPEN) 100 UNIT/ML Inject 33 Units into the skin daily. 15 mL 3   Multiple Vitamins-Minerals (MULTIVITAMIN WITH MINERALS) tablet Take 1 tablet by mouth daily.     cetirizine (ZYRTEC) 10 MG tablet Take 1 tablet (10 mg total) by mouth daily as needed for allergies. 90 tablet 0   Insulin Pen Needle (PEN NEEDLES) 32G X 4 MM MISC Use 1 pen needle to inject insulin 4 times per day. 200 each 11   sertraline (ZOLOFT) 100 MG tablet Take 1 tablet (100 mg total) by mouth daily. 90 tablet 0     Physical Exam: Blood pressure 114/79, pulse (!) 115, temperature 98.5 F (36.9 C), temperature source Oral, resp. rate 14,  height 5\' 4"  (1.626 m), weight 99.6 kg, SpO2 93 %. General: resting comfortably, appears stated age, no apparent distress Neurological: alert and oriented, no focal deficits, cranial nerves grossly in tact HEENT: normocephalic, atraumatic CV: tachycardic, regular Respiratory: normal work of breathing on room air Abdomen: soft, nondistended, tender to palpation in LUQ. Extremities: warm and well-perfused, no deformities, moving all extremities spontaneously Psychiatric: normal mood and affect Skin: warm and dry, no jaundice, no rashes or lesions   Results  for orders placed or performed during the hospital encounter of 01/31/23 (from the past 48 hour(s))  Lipase, blood     Status: Abnormal   Collection Time: 01/31/23  7:29 AM  Result Value Ref Range   Lipase 653 (H) 11 - 51 U/L    Comment: RESULTS CONFIRMED BY MANUAL DILUTION POST-ULTRACENTRIFUGATION Performed at [facility], [address], [place], [place] [address]   Comprehensive metabolic panel     Status: Abnormal   Collection Time: 01/31/23  7:29 AM  Result Value Ref Range   Sodium 134 (L) 135 - 145 mmol/L   Potassium 3.7 3.5 - 5.1 mmol/L    Comment: POST-ULTRACENTRIFUGATION   Chloride 98 98 - 111 mmol/L   CO2 20 (L) 22 - 32 mmol/L   Glucose, Bld 313 (H) 70 - 99 mg/dL    Comment: Glucose reference range applies only to samples taken after fasting for at least 8 hours.   BUN 10 6 - 20 mg/dL   Creatinine, Ser 6.04 0.61 - 1.24 mg/dL   Calcium 8.6 (L) 8.9 - 10.3 mg/dL   Total Protein 6.3 (L) 6.5 - 8.1 g/dL   Albumin 4.2 3.5 - 5.0 g/dL   AST 33 15 - 41 U/L   ALT 58 (H) 0 - 44 U/L   Alkaline Phosphatase 87 38 - 126 U/L   Total Bilirubin 1.4 (H) 0.3 - 1.2 mg/dL   GFR, Estimated >54 >09 mL/min    Comment: (NOTE) Calculated using the CKD-EPI Creatinine Equation (2021)    Anion gap 16 (H) 5 - 15    Comment: Performed at [facility], [address], [place], [place] [address]  CBC     Status: Abnormal   Collection Time: 01/31/23  7:29 AM  Result Value Ref Range   WBC 12.1 (H) 4.0 - 10.5 K/uL   RBC RESULTS UNAVAILABLE DUE TO INTERFERING SUBSTANCE 4.22 - 5.81 MIL/uL   Hemoglobin 19.4 (H) 13.0 - 17.0 g/dL   HCT RESULTS UNAVAILABLE DUE TO INTERFERING SUBSTANCE 39.0 - 52.0 %   MCV RESULTS UNAVAILABLE DUE TO INTERFERING SUBSTANCE 80.0 - 100.0 fL   MCH RESULTS UNAVAILABLE DUE TO INTERFERING SUBSTANCE 26.0 - 34.0 pg   MCHC RESULTS UNAVAILABLE DUE TO INTERFERING SUBSTANCE 30.0 - 36.0 g/dL   RDW RESULTS UNAVAILABLE DUE TO INTERFERING SUBSTANCE 11.5 - 15.5 %   Platelets 239  150 - 400 K/uL   nRBC 0.0 0.0 - 0.2 %    Comment: Performed at [facility], [address] [place], [place] [address]  Triglycerides     Status: Abnormal   Collection Time: 01/31/23 10:19 AM  Result Value Ref Range   Triglycerides >5,000 (H) <150 mg/dL    Comment: RESULT CONFIRMED BY MANUAL DILUTION Performed at [facility], [address] [place], [place] [address]   Urinalysis, Routine w reflex microscopic -Urine, Clean Catch     Status: Abnormal   Collection Time: 01/31/23 12:30 PM  Result Value Ref Range  Color, Urine YELLOW YELLOW   APPearance CLEAR CLEAR   Specific Gravity, Urine >1.046 (H) 1.005 - 1.030   pH 5.5 5.0 - 8.0   Glucose, UA >1,000 (A) NEGATIVE mg/dL   Hgb urine dipstick NEGATIVE NEGATIVE   Bilirubin Urine NEGATIVE NEGATIVE   Ketones, ur TRACE (A) NEGATIVE mg/dL   Protein, ur 30 (A) NEGATIVE mg/dL   Nitrite NEGATIVE NEGATIVE   Leukocytes,Ua NEGATIVE NEGATIVE   RBC / HPF 0-5 0 - 5 RBC/hpf   WBC, UA 0-5 0 - 5 WBC/hpf   Bacteria, UA NONE SEEN NONE SEEN   Squamous Epithelial / HPF 0-5 0 - 5 /HPF   Mucus PRESENT    Hyaline Casts, UA PRESENT     Comment: Performed at [facility], [address] [place], [place] [address]  CBG monitoring, ED     Status: Abnormal   Collection Time: 01/31/23 12:54 PM  Result Value Ref Range   Glucose-Capillary 224 (H) 70 - 99 mg/dL    Comment: Glucose reference range applies only to samples taken after fasting for at least 8 hours.  Glucose, capillary     Status: Abnormal   Collection Time: 01/31/23  3:05 PM  Result Value Ref Range   Glucose-Capillary 196 (H) 70 - 99 mg/dL    Comment: Glucose reference range applies only to samples taken after fasting for at least 8 hours.   Comment 1 Notify RN    Comment 2 Document in Chart    Comment 3 Glucose Stabilizer   MRSA Next Gen by PCR, Nasal     Status: None   Collection Time: 01/31/23  3:17 PM   Specimen: Nasal Mucosa; Nasal Swab   Result Value Ref Range   MRSA by PCR Next Gen NOT DETECTED NOT DETECTED    Comment: (NOTE) The GeneXpert MRSA Assay (FDA approved for NASAL specimens only), is one component of a comprehensive MRSA colonization surveillance program. It is not intended to diagnose MRSA infection nor to guide or monitor treatment for MRSA infections. Test performance is not FDA approved in patients less than 31 years old. Performed at [facility], [address] [place], [place] [address]   Triglycerides     Status: Abnormal   Collection Time: 01/31/23  4:20 PM  Result Value Ref Range   Triglycerides 3,323 (H) <150 mg/dL    Comment: RESULT CONFIRMED BY MANUAL DILUTION Performed at [facility], [address] [place], [place] [address]   Culture, blood (Routine X 2) w Reflex to ID Panel     Status: None (Preliminary result)   Collection Time: 01/31/23  4:20 PM   Specimen: BLOOD RIGHT ARM  Result Value Ref Range   Specimen Description      BLOOD RIGHT ARM Performed at [facility], [address], [place], [place] [address]    Special Requests      BOTTLES DRAWN AEROBIC ONLY Blood Culture adequate volume Performed at [facility] [address], [place], [place] [address]    Culture PENDING    Report Status PENDING   Culture, blood (Routine X 2) w Reflex to ID Panel     Status: None (Preliminary result)   Collection Time: 01/31/23  4:20 PM   Specimen: BLOOD RIGHT HAND  Result Value Ref Range   Specimen Description      BLOOD RIGHT HAND Performed at [facility], [address], [place], [place] [address]    Special Requests  BOTTLES DRAWN AEROBIC ONLY Blood Culture adequate volume Performed at [facility], [address] [place], [place] [address]    Culture PENDING    Report Status PENDING   CBC     Status: Abnormal   Collection Time: 01/31/23  4:20 PM  Result Value Ref Range   WBC 12.4  (H) 4.0 - 10.5 K/uL   RBC 6.05 (H) 4.22 - 5.81 MIL/uL   Hemoglobin 18.7 (H) 13.0 - 17.0 g/dL   HCT 19.1 47.8 - 29.5 %   MCV 83.0 80.0 - 100.0 fL   MCH 30.9 26.0 - 34.0 pg   MCHC 37.3 (H) 30.0 - 36.0 g/dL   RDW 62.1 30.8 - 65.7 %   Platelets 229 150 - 400 K/uL   nRBC 0.0 0.0 - 0.2 %    Comment: Performed at [facility], [address], [place], [place] [address]  Creatinine, serum     Status: None   Collection Time: 01/31/23  4:20 PM  Result Value Ref Range   Creatinine, Ser 0.97 0.61 - 1.24 mg/dL   GFR, Estimated >29 >52 mL/min    Comment: (NOTE) Calculated using the CKD-EPI Creatinine Equation (2021) Performed at [facility], [address], [place], [place] [address]   Glucose, capillary     Status: Abnormal   Collection Time: 01/31/23  4:48 PM  Result Value Ref Range   Glucose-Capillary 193 (H) 70 - 99 mg/dL    Comment: Glucose reference range applies only to samples taken after fasting for at least 8 hours.   Comment 1 Notify RN    Comment 2 Document in Chart   Glucose, capillary     Status: Abnormal   Collection Time: 01/31/23  5:36 PM  Result Value Ref Range   Glucose-Capillary 179 (H) 70 - 99 mg/dL    Comment: Glucose reference range applies only to samples taken after fasting for at least 8 hours.   Comment 1 Notify RN    Comment 2 Document in Chart    Comment 3 Glucose Stabilizer   Glucose, capillary     Status: Abnormal   Collection Time: 01/31/23  6:41 PM  Result Value Ref Range   Glucose-Capillary 177 (H) 70 - 99 mg/dL    Comment: Glucose reference range applies only to samples taken after fasting for at least 8 hours.   Comment 1 Notify RN    Comment 2 Document in Chart   Glucose, capillary     Status: Abnormal   Collection Time: 01/31/23  8:05 PM  Result Value Ref Range   Glucose-Capillary 136 (H) 70 - 99 mg/dL    Comment: Glucose reference range applies only to samples taken after fasting for at least 8 hours.   Glucose, capillary     Status: Abnormal   Collection Time: 01/31/23  9:25 PM  Result Value Ref Range   Glucose-Capillary 148 (H) 70 - 99 mg/dL    Comment: Glucose reference range applies only to samples taken after fasting for at least 8 hours.  Glucose, capillary     Status: Abnormal   Collection Time: 01/31/23 10:23 PM  Result Value Ref Range   Glucose-Capillary 127 (H) 70 - 99 mg/dL    Comment: Glucose reference range applies only to samples taken after fasting for at least 8 hours.  Glucose, capillary     Status: Abnormal   Collection Time: 01/31/23 11:34 PM  Result Value Ref Range   Glucose-Capillary 105 (H) 70 -  99 mg/dL    Comment: Glucose reference range applies only to samples taken after fasting for at least 8 hours.   Comment 1 Notify RN    Comment 2 Document in Chart   Glucose, capillary     Status: None   Collection Time: 02/01/23 12:41 AM  Result Value Ref Range   Glucose-Capillary 93 70 - 99 mg/dL    Comment: Glucose reference range applies only to samples taken after fasting for at least 8 hours.   Comment 1 Notify RN    Comment 2 Document in Chart   Glucose, capillary     Status: Abnormal   Collection Time: 02/01/23  1:37 AM  Result Value Ref Range   Glucose-Capillary 107 (H) 70 - 99 mg/dL    Comment: Glucose reference range applies only to samples taken after fasting for at least 8 hours.  Glucose, capillary     Status: Abnormal   Collection Time: 02/01/23  2:32 AM  Result Value Ref Range   Glucose-Capillary 108 (H) 70 - 99 mg/dL    Comment: Glucose reference range applies only to samples taken after fasting for at least 8 hours.  Glucose, capillary     Status: None   Collection Time: 02/01/23  3:35 AM  Result Value Ref Range   Glucose-Capillary 79 70 - 99 mg/dL    Comment: Glucose reference range applies only to samples taken after fasting for at least 8 hours.  Glucose, capillary     Status: None   Collection Time: 02/01/23  4:18 AM  Result Value Ref  Range   Glucose-Capillary 73 70 - 99 mg/dL    Comment: Glucose reference range applies only to samples taken after fasting for at least 8 hours.  Triglycerides     Status: Abnormal   Collection Time: 02/01/23  4:58 AM  Result Value Ref Range   Triglycerides 1,032 (H) <150 mg/dL    Comment: RESULT CONFIRMED BY MANUAL DILUTION Performed at [facility], [address], [place], [place] [address]   CBC     Status: Abnormal   Collection Time: 02/01/23  4:58 AM  Result Value Ref Range   WBC 9.8 4.0 - 10.5 K/uL   RBC 6.72 (H) 4.22 - 5.81 MIL/uL   Hemoglobin 19.4 (H) 13.0 - 17.0 g/dL   HCT 62.1 (H) 30.8 - 65.7 %   MCV 82.0 80.0 - 100.0 fL   MCH 28.9 26.0 - 34.0 pg   MCHC 35.2 30.0 - 36.0 g/dL   RDW 84.6 96.2 - 95.2 %   Platelets 210 150 - 400 K/uL   nRBC 0.0 0.0 - 0.2 %    Comment: Performed at [facility], [address] [place], [place] [address]  Hemoglobin A1c     Status: Abnormal   Collection Time: 02/01/23  4:58 AM  Result Value Ref Range   Hgb A1c MFr Bld 10.8 (H) 4.8 - 5.6 %    Comment: (NOTE) Pre diabetes:          5.7%-6.4%  Diabetes:              >6.4%  Glycemic control for   <7.0% adults with diabetes    Mean Plasma Glucose 263.26 mg/dL    Comment: Performed at [facility], [address], [place], [place] [address]  Glucose, capillary     Status: None   Collection Time: 02/01/23  5:29 AM  Result Value Ref Range   Glucose-Capillary 98 70 -  99 mg/dL    Comment: Glucose reference range applies only to samples taken after fasting for at least 8 hours.  Glucose, capillary     Status: Abnormal   Collection Time: 02/01/23  6:35 AM  Result Value Ref Range   Glucose-Capillary 103 (H) 70 - 99 mg/dL    Comment: Glucose reference range applies only to samples taken after fasting for at least 8 hours.   Comment 1 Notify RN    Comment 2 Document in Chart    CT ABDOMEN PELVIS W CONTRAST  Result Date: 01/31/2023 CLINICAL DATA:   Pancreatitis, acute, severe. EXAM: CT ABDOMEN AND PELVIS WITH CONTRAST TECHNIQUE: Multidetector CT imaging of the abdomen and pelvis was performed using the standard protocol following bolus administration of intravenous contrast. RADIATION DOSE REDUCTION: This exam was performed according to the departmental dose-optimization program which includes automated exposure control, adjustment of the mA and/or kV according to patient size and/or use of iterative reconstruction technique. CONTRAST:  85mL OMNIPAQUE IOHEXOL 300 MG/ML  SOLN COMPARISON:  CT abdomen/pelvis 04/30/2022. FINDINGS: Lower chest: No acute abnormality. Hepatobiliary: Heterogeneous attenuation in the liver, suggestive of heterogeneous hepatic steatosis. Layering hyperdensity in the gallbladder neck may reflect sludge or small stones. No biliary dilatation. Pancreas: Moderate peripancreatic fat stranding. Focal hypoattenuation of the pancreatic tail, suspicious for necrosis (axial image 25 series 2). Spleen: Normal. Adrenals/Urinary Tract: Adrenal glands are unremarkable. Kidneys are normal, without renal calculi, focal lesion, or hydronephrosis. Bladder is unremarkable. Stomach/Bowel: Moderate fat stranding around the second and third portions of the duodenum. No dilated loops of small bowel. Normal appendix is visualized on axial image 59 series 2. No colon wall thickening or surrounding inflammation. Vascular/Lymphatic: Aortic atherosclerosis. No enlarged abdominal or pelvic lymph nodes. Reproductive: Prostate is unremarkable. Other: No abdominal wall hernia or abnormality. No abdominopelvic ascites. Musculoskeletal: No acute or significant osseous findings. IMPRESSION: 1. Moderate peripancreatic fat stranding, consistent with acute pancreatitis. Focal hypoattenuation of the pancreatic tail, suspicious for necrosis. 2. Heterogeneous attenuation of the liver, suggestive of heterogeneous hepatic steatosis. 3. Layering hyperdensity in the gallbladder  neck may reflect sludge or small stones. No biliary dilatation. 4. Aortic Atherosclerosis (ICD10-I70.0). Electronically Signed   By: Orvan Falconer M.D.   On: 01/31/2023 10:57      Assessment/Plan 37 yo male with recurrent hypertriglyceridemia-induced acute pancreatitis. I personally reviewed his labs, imaging and notes. His CT shows diffuse peripancreatic stranding, as well as hypointensity in the tail of the pancreas suggesting developing necrosis vs a fluid collection. There is no gas within this area to suggest infection. There is no indication for antibiotics for pancreatic necrosis in the absence of infection. He is currently very early in the course of his current episode of pancreatitis. Continue supportive care. Labs are consistent with hemoconcentration - additional crystalloid bolus ordered for this morning. Monitor UOP closely. Ok for sips of clear liquids. If symptoms do not improve in next few days, would strongly consider placement of a nasojejunal feeding tube to optimize nutrition. No indication for acute surgical intervention. Would also consider referral to the lipid clinic at [facility] as an outpatient, given multiple episodes of pancreatitis secondary to hypertriglyceridemia.   Sophronia Simas, MD [facility] General, Hepatobiliary and Pancreatic Surgery 02/01/23 7:44 AM

## 2023-02-01 DIAGNOSIS — K8591 Acute pancreatitis with uninfected necrosis, unspecified: Secondary | ICD-10-CM | POA: Diagnosis not present

## 2023-02-01 LAB — GLUCOSE, CAPILLARY
Glucose-Capillary: 103 mg/dL — ABNORMAL HIGH (ref 70–99)
Glucose-Capillary: 106 mg/dL — ABNORMAL HIGH (ref 70–99)
Glucose-Capillary: 107 mg/dL — ABNORMAL HIGH (ref 70–99)
Glucose-Capillary: 108 mg/dL — ABNORMAL HIGH (ref 70–99)
Glucose-Capillary: 112 mg/dL — ABNORMAL HIGH (ref 70–99)
Glucose-Capillary: 115 mg/dL — ABNORMAL HIGH (ref 70–99)
Glucose-Capillary: 120 mg/dL — ABNORMAL HIGH (ref 70–99)
Glucose-Capillary: 128 mg/dL — ABNORMAL HIGH (ref 70–99)
Glucose-Capillary: 150 mg/dL — ABNORMAL HIGH (ref 70–99)
Glucose-Capillary: 150 mg/dL — ABNORMAL HIGH (ref 70–99)
Glucose-Capillary: 179 mg/dL — ABNORMAL HIGH (ref 70–99)
Glucose-Capillary: 225 mg/dL — ABNORMAL HIGH (ref 70–99)
Glucose-Capillary: 59 mg/dL — ABNORMAL LOW (ref 70–99)
Glucose-Capillary: 68 mg/dL — ABNORMAL LOW (ref 70–99)
Glucose-Capillary: 73 mg/dL (ref 70–99)
Glucose-Capillary: 79 mg/dL (ref 70–99)
Glucose-Capillary: 89 mg/dL (ref 70–99)
Glucose-Capillary: 90 mg/dL (ref 70–99)
Glucose-Capillary: 91 mg/dL (ref 70–99)
Glucose-Capillary: 93 mg/dL (ref 70–99)
Glucose-Capillary: 98 mg/dL (ref 70–99)

## 2023-02-01 LAB — CBC
HCT: 55.1 % — ABNORMAL HIGH (ref 39.0–52.0)
Hemoglobin: 19.4 g/dL — ABNORMAL HIGH (ref 13.0–17.0)
MCH: 28.9 pg (ref 26.0–34.0)
MCHC: 35.2 g/dL (ref 30.0–36.0)
MCV: 82 fL (ref 80.0–100.0)
Platelets: 210 10*3/uL (ref 150–400)
RBC: 6.72 MIL/uL — ABNORMAL HIGH (ref 4.22–5.81)
RDW: 13.2 % (ref 11.5–15.5)
WBC: 9.8 10*3/uL (ref 4.0–10.5)
nRBC: 0 % (ref 0.0–0.2)

## 2023-02-01 LAB — COMPREHENSIVE METABOLIC PANEL
ALT: 36 U/L (ref 0–44)
AST: 39 U/L (ref 15–41)
Albumin: 2.5 g/dL — ABNORMAL LOW (ref 3.5–5.0)
Alkaline Phosphatase: 42 U/L (ref 38–126)
Anion gap: 10 (ref 5–15)
BUN: 5 mg/dL — ABNORMAL LOW (ref 6–20)
CO2: 19 mmol/L — ABNORMAL LOW (ref 22–32)
Calcium: 5.4 mg/dL — CL (ref 8.9–10.3)
Chloride: 103 mmol/L (ref 98–111)
Creatinine, Ser: 0.89 mg/dL (ref 0.61–1.24)
GFR, Estimated: >60 mL/min (ref >60)
Glucose, Bld: 133 mg/dL — ABNORMAL HIGH (ref 70–99)
Potassium: 3.2 mmol/L — ABNORMAL LOW (ref 3.5–5.1)
Sodium: 132 mmol/L — ABNORMAL LOW (ref 135–145)
Total Bilirubin: 1.8 mg/dL — ABNORMAL HIGH (ref 0.3–1.2)
Total Protein: 4.8 g/dL — ABNORMAL LOW (ref 6.5–8.1)

## 2023-02-01 LAB — TRIGLYCERIDES
Triglycerides: 1032 mg/dL — ABNORMAL HIGH (ref <150)
Triglycerides: 271 mg/dL — ABNORMAL HIGH (ref <150)

## 2023-02-01 LAB — HIV ANTIBODY (ROUTINE TESTING W REFLEX): HIV Screen 4th Generation wRfx: NONREACTIVE

## 2023-02-01 LAB — HEMOGLOBIN A1C
Hgb A1c MFr Bld: 10.8 % — ABNORMAL HIGH (ref 4.8–5.6)
Mean Plasma Glucose: 263.26 mg/dL

## 2023-02-01 LAB — LIPASE, BLOOD: Lipase: 417 U/L — ABNORMAL HIGH (ref 11–51)

## 2023-02-01 LAB — MAGNESIUM: Magnesium: 1.4 mg/dL — ABNORMAL LOW (ref 1.7–2.4)

## 2023-02-01 LAB — PHOSPHORUS: Phosphorus: 2.1 mg/dL — ABNORMAL LOW (ref 2.5–4.6)

## 2023-02-01 MED ORDER — SODIUM CHLORIDE 0.9 % IV BOLUS
1000.0000 mL | Freq: Once | INTRAVENOUS | Status: AC
  Administered 2023-02-01: 1000 mL via INTRAVENOUS

## 2023-02-01 MED ORDER — HYDROMORPHONE HCL 1 MG/ML IJ SOLN
1.0000 mg | INTRAMUSCULAR | Status: DC | PRN
  Administered 2023-02-01 – 2023-02-02 (×7): 1 mg via INTRAVENOUS
  Filled 2023-02-01 (×7): qty 1

## 2023-02-01 MED ORDER — FENOFIBRATE 160 MG PO TABS
160.0000 mg | ORAL_TABLET | Freq: Every day | ORAL | Status: DC
  Administered 2023-02-01 – 2023-02-09 (×9): 160 mg via ORAL
  Filled 2023-02-01 (×10): qty 1

## 2023-02-01 MED ORDER — SODIUM CHLORIDE 0.9 % IV SOLN: INTRAVENOUS | Status: DC

## 2023-02-01 MED ORDER — INSULIN DETEMIR 100 UNIT/ML ~~LOC~~ SOLN
5.0000 [IU] | Freq: Every day | SUBCUTANEOUS | Status: DC
  Administered 2023-02-01: 5 [IU] via SUBCUTANEOUS
  Filled 2023-02-01: qty 0.05

## 2023-02-01 MED ORDER — POTASSIUM PHOSPHATES 15 MMOLE/5ML IV SOLN
30.0000 mmol | Freq: Once | INTRAVENOUS | Status: AC
  Administered 2023-02-01: 30 mmol via INTRAVENOUS
  Filled 2023-02-01: qty 10

## 2023-02-01 MED ORDER — INSULIN ASPART 100 UNIT/ML IJ SOLN
0.0000 [IU] | INTRAMUSCULAR | Status: DC
  Administered 2023-02-01: 2 [IU] via SUBCUTANEOUS
  Administered 2023-02-02 (×3): 3 [IU] via SUBCUTANEOUS
  Administered 2023-02-02: 2 [IU] via SUBCUTANEOUS
  Administered 2023-02-02: 3 [IU] via SUBCUTANEOUS
  Administered 2023-02-02 (×2): 2 [IU] via SUBCUTANEOUS
  Administered 2023-02-03: 3 [IU] via SUBCUTANEOUS
  Administered 2023-02-03: 2 [IU] via SUBCUTANEOUS
  Administered 2023-02-03: 1 [IU] via SUBCUTANEOUS
  Administered 2023-02-03 – 2023-02-04 (×4): 2 [IU] via SUBCUTANEOUS
  Administered 2023-02-04: 1 [IU] via SUBCUTANEOUS
  Administered 2023-02-04: 3 [IU] via SUBCUTANEOUS
  Administered 2023-02-05: 2 [IU] via SUBCUTANEOUS
  Administered 2023-02-05 (×2): 1 [IU] via SUBCUTANEOUS
  Administered 2023-02-05 (×2): 2 [IU] via SUBCUTANEOUS
  Administered 2023-02-05: 1 [IU] via SUBCUTANEOUS
  Administered 2023-02-05: 3 [IU] via SUBCUTANEOUS
  Administered 2023-02-06 (×2): 2 [IU] via SUBCUTANEOUS
  Administered 2023-02-06: 3 [IU] via SUBCUTANEOUS
  Administered 2023-02-07: 5 [IU] via SUBCUTANEOUS
  Administered 2023-02-07 (×2): 2 [IU] via SUBCUTANEOUS
  Administered 2023-02-07: 3 [IU] via SUBCUTANEOUS
  Administered 2023-02-08: 2 [IU] via SUBCUTANEOUS
  Administered 2023-02-08: 1 [IU] via SUBCUTANEOUS
  Administered 2023-02-08: 2 [IU] via SUBCUTANEOUS
  Administered 2023-02-08: 3 [IU] via SUBCUTANEOUS
  Administered 2023-02-08: 2 [IU] via SUBCUTANEOUS
  Administered 2023-02-09: 1 [IU] via SUBCUTANEOUS
  Administered 2023-02-09: 3 [IU] via SUBCUTANEOUS
  Administered 2023-02-09: 2 [IU] via SUBCUTANEOUS
  Administered 2023-02-09: 1 [IU] via SUBCUTANEOUS
  Administered 2023-02-09: 2 [IU] via SUBCUTANEOUS
  Administered 2023-02-09: 3 [IU] via SUBCUTANEOUS
  Administered 2023-02-10: 2 [IU] via SUBCUTANEOUS
  Administered 2023-02-10: 1 [IU] via SUBCUTANEOUS
  Administered 2023-02-10: 3 [IU] via SUBCUTANEOUS
  Administered 2023-02-10: 1 [IU] via SUBCUTANEOUS
  Administered 2023-02-10: 5 [IU] via SUBCUTANEOUS
  Administered 2023-02-11: 2 [IU] via SUBCUTANEOUS
  Administered 2023-02-11: 1 [IU] via SUBCUTANEOUS
  Administered 2023-02-11 (×2): 2 [IU] via SUBCUTANEOUS
  Administered 2023-02-11: 1 [IU] via SUBCUTANEOUS
  Administered 2023-02-11: 3 [IU] via SUBCUTANEOUS
  Administered 2023-02-11 – 2023-02-12 (×2): 2 [IU] via SUBCUTANEOUS
  Administered 2023-02-12: 1 [IU] via SUBCUTANEOUS
  Administered 2023-02-12: 3 [IU] via SUBCUTANEOUS

## 2023-02-01 MED ORDER — CALCIUM GLUCONATE-NACL 1-0.675 GM/50ML-% IV SOLN
1.0000 g | Freq: Once | INTRAVENOUS | Status: AC
  Administered 2023-02-01: 1000 mg via INTRAVENOUS
  Filled 2023-02-01: qty 50

## 2023-02-01 MED ORDER — POTASSIUM CHLORIDE 10 MEQ/100ML IV SOLN: 10.0000 meq | INTRAVENOUS | Status: DC

## 2023-02-01 MED ORDER — MAGNESIUM SULFATE 4 GM/100ML IV SOLN
4.0000 g | Freq: Once | INTRAVENOUS | Status: AC
  Administered 2023-02-01: 4 g via INTRAVENOUS
  Filled 2023-02-01: qty 100

## 2023-02-01 MED ORDER — DEXTROSE 50 % IV SOLN
INTRAVENOUS | Status: AC
  Administered 2023-02-01: 25 mL via INTRAVENOUS
  Filled 2023-02-01: qty 50

## 2023-02-01 MED ORDER — ATORVASTATIN CALCIUM 40 MG PO TABS
40.0000 mg | ORAL_TABLET | Freq: Every day | ORAL | Status: DC
  Administered 2023-02-01 – 2023-02-06 (×6): 40 mg via ORAL
  Filled 2023-02-01 (×6): qty 1

## 2023-02-01 NOTE — Inpatient Diabetes Management (Signed)
Inpatient Diabetes Program Recommendations  AACE/ADA: New Consensus Statement on Inpatient Glycemic Control (2015)  Target Ranges:  Prepandial:   less than 140 mg/dL      Peak postprandial:   less than 180 mg/dL (1-2 hours)      Critically ill patients:  140 - 180 mg/dL    Latest Reference Range & Units 10/01/22 15:02 02/01/23 04:58  Hemoglobin A1C 4.8 - 5.6 % 8.9 ! 10.8 (H)  263 mg/dl  !: Data is abnormal (H): Data is abnormally high  Latest Reference Range & Units 02/01/23 00:41 02/01/23 01:37 02/01/23 02:32 02/01/23 03:35 02/01/23 04:18 02/01/23 05:29 02/01/23 06:35  Glucose-Capillary 70 - 99 mg/dL 93 161 (H) 096 (H) 79 73 98 103 (H)  (H): Data is abnormally high   Diabetes history: DM2  Outpatient Diabetes meds: Novolog 4 units TID with meals           Basaglar 33 units daily  Current Orders: IV Insulin Drip for High TG    CBGs are stable on the IV Insulin Drip at current rate  D5% IVF running 75cc/hr   Addendum 11:30am--Met w/ pt at bedside to discuss current A1c of 10.8%.  Pt told me his last A1c in March was around the same value.  His PCP has referred him to [facility] ENDO but 1st available appt not until Nov 2024--pt currently on the cancellation list to try to get sooner appt.  Pt told me he was prescribed the Freestyle Libre 3 CGM but had issues with Insurance, PA, etc and finally started the [place] at home about 2 weeks ago.  We talked about goal glucose levels and goal A1c and I asked him to monitor and keep records of fasting, pre-meal, and 2-hr post-meal glucose levels.  We talked about how the 2 different insulins he takes at home work and that he may need further adjustment of home insulin.  This is his 4th bout of pancreatitis--Told me the MD mentioned going to a Lipid clinic after d/c.  I reviewed with pt the current treatment of IV Insulin and how we will keep him on IV insulin until his TG drop to a certain level (less than 500).  Pt appreciative of visit and did  not have any further questions for me.    --Will follow patient during hospitalization--  Ambrose Finland RN, MSN, CDCES Diabetes Coordinator Inpatient Glycemic Control Team Team Pager: [phone] (8a-5p)

## 2023-02-01 NOTE — Progress Notes (Signed)
PROGRESS NOTE    George Howe  XBJ:[id number] DOB: [birth year] DOA: 01/31/2023 PCP: [name], [name], MD    Brief Narrative:  37 year old with history of type 2 diabetes on insulin, hypertriglyceridemia on Lipitor, previous episodes of pancreatitis secondary to hypertriglyceridemia, chronic anxiety and depression presented with 2 days of epigastric pain and back pain.  In the emergency room triglycerides more than 5000, lipase 653, serum glucose 313 and anion gap of 16.  Clinically diagnosed as pancreatitis, started on IV fluids.  CT scan showed moderate peripancreatic fat stranding consistent with acute pancreatitis and suspicious for necrosis at the pancreatic tail.  Hepatic steatosis.  Admitted with acute pancreatitis.   Assessment & Plan:   Acute pancreatitis secondary to hypertriglyceridemia: Pancreatic tail necrosis. Hemodynamically stable today. Continue adequate IV fluids, additional 1 L of isotonic fluid bolus now.  Patient currently on dextrose, will increase dose to 125 mL/h to keep up his blood sugars on insulin infusion.  Intake and output monitoring.  Recheck electrolytes. Adequate IV opiates for pain relief. N.p.o., allow sips and chips and clear water. No indication for antibiotics. Insulin infusion to decrease level of triglyceride, appropriately responding. Presentation triglyceride 5000-trending down.  Continue monitoring until less than 500. Will keep on insulin infusion until triglycerides less than 500 and patient is able to take by mouth medications.  Hopefully he can go back on atorvastatin and fenofibrate.  Insulin-dependent type 2 diabetes, hypoglycemia: Blood sugar acceptable and low normal currently on insulin infusion for hypertriglycerides. Continue dextrose infusion along with insulin.  Monitor.  Chronic anxiety depression: Currently on Wellbutrin.  Will continue.    DVT prophylaxis: enoxaparin (LOVENOX) injection 40 mg Start: 01/31/23  1800   Code Status: Full code Family Communication: None at the bedside Disposition Plan: Status is: Inpatient Remains inpatient appropriate because: Severe pancreatitis, IV fluid, IV pain medications     Consultants:  General surgery  Procedures:  None  Antimicrobials:  None   Subjective: Patient seen and examined.  He has episodic pain but denies any nausea vomiting.  Normal bowel movement yesterday afternoon.  Gets crampy abdominal pain 6 or 7 in intensity and relieved with Dilaudid.  Objective: Vitals:   02/01/23 0636 02/01/23 0735 02/01/23 0755 02/01/23 0936  BP: 114/79     Pulse: (!) 116 (!) 115  (!) 114  Resp: 18 14  20   Temp:   98 F (36.7 C)   TempSrc:   Oral   SpO2: 94% 93%  91%  Weight:      Height:        Intake/Output Summary (Last 24 hours) at 02/01/2023 1148 Last data filed at 02/01/2023 0645 Gross per 24 hour  Intake 3156.58 ml  Output 250 ml  Net 2906.58 ml   Filed Weights   01/31/23 0725 01/31/23 1500  Weight: 97.5 kg 99.6 kg    Examination:  General exam: Appears calm , occasionally uncomfortable with spasm. Respiratory system: Clear to auscultation. Respiratory effort normal.  No added sounds. Cardiovascular system: S1 & S2 heard, RRR. No pedal edema. Gastrointestinal system: Soft.  Mildly distended.  Mild diffuse tenderness all over without localized rigidity or guarding.  Bowel sounds sluggish. Central nervous system: Alert and oriented. No focal neurological deficits. Extremities: Symmetric 5 x 5 power. Skin: No rashes, lesions or ulcers Psychiatry: Judgement and insight appear normal. Mood & affect appropriate.     Data Reviewed: I have personally reviewed following labs and imaging studies  CBC: Recent Labs  Lab 01/31/23 0729 01/31/23 1620  02/01/23 0458  WBC 12.1* 12.4* 9.8  HGB 19.4* 18.7* 19.4*  HCT RESULTS UNAVAILABLE DUE TO INTERFERING SUBSTANCE 50.2 55.1*  MCV RESULTS UNAVAILABLE DUE TO INTERFERING SUBSTANCE 83.0  82.0  PLT 239 229 210   Basic Metabolic Panel: Recent Labs  Lab 01/31/23 0729 01/31/23 1620  NA 134*  --   K 3.7  --   CL 98  --   CO2 20*  --   GLUCOSE 313*  --   BUN 10  --   CREATININE 1.08 0.97  CALCIUM 8.6*  --    GFR: Estimated Creatinine Clearance: 112.3 mL/min (by C-G formula based on SCr of 0.97 mg/dL). Liver Function Tests: Recent Labs  Lab 01/31/23 0729  AST 33  ALT 58*  ALKPHOS 87  BILITOT 1.4*  PROT 6.3*  ALBUMIN 4.2   Recent Labs  Lab 01/31/23 0729  LIPASE 653*   No results for input(s): "AMMONIA" in the last 168 hours. Coagulation Profile: No results for input(s): "INR", "PROTIME" in the last 168 hours. Cardiac Enzymes: No results for input(s): "CKTOTAL", "CKMB", "CKMBINDEX", "TROPONINI" in the last 168 hours. BNP (last 3 results) No results for input(s): "PROBNP" in the last 8760 hours. HbA1C: Recent Labs    02/01/23 0458  HGBA1C 10.8*   CBG: Recent Labs  Lab 02/01/23 0529 02/01/23 0635 02/01/23 0806 02/01/23 1033 02/01/23 1127  GLUCAP 98 103* 68* 112* 90   Lipid Profile: Recent Labs    01/31/23 1620 02/01/23 0458  TRIG 3,323* 1,032*   Thyroid Function Tests: No results for input(s): "TSH", "T4TOTAL", "FREET4", "T3FREE", "THYROIDAB" in the last 72 hours. Anemia Panel: No results for input(s): "VITAMINB12", "FOLATE", "FERRITIN", "TIBC", "IRON", "RETICCTPCT" in the last 72 hours. Sepsis Labs: No results for input(s): "PROCALCITON", "LATICACIDVEN" in the last 168 hours.  Recent Results (from the past 240 hour(s))  MRSA Next Gen by PCR, Nasal     Status: None   Collection Time: 01/31/23  3:17 PM   Specimen: Nasal Mucosa; Nasal Swab  Result Value Ref Range Status   MRSA by PCR Next Gen NOT DETECTED NOT DETECTED Final    Comment: (NOTE) The GeneXpert MRSA Assay (FDA approved for NASAL specimens only), is one component of a comprehensive MRSA colonization surveillance program. It is not intended to diagnose MRSA infection nor  to guide or monitor treatment for MRSA infections. Test performance is not FDA approved in patients less than 4 years old. Performed at [facility], [address], [place], [place] [address]   Culture, blood (Routine X 2) w Reflex to ID Panel     Status: None (Preliminary result)   Collection Time: 01/31/23  4:20 PM   Specimen: BLOOD RIGHT ARM  Result Value Ref Range Status   Specimen Description   Final    BLOOD RIGHT ARM Performed at [facility], [address], [place], [place] [address]    Special Requests   Final    BOTTLES DRAWN AEROBIC ONLY Blood Culture adequate volume Performed at [facility], [address], [place], [place] [address]    Culture   Final    NO GROWTH < 12 HOURS Performed at [facility], [address], [place], [place] [address]    Report Status PENDING  Incomplete  Culture, blood (Routine X 2) w Reflex to ID Panel     Status: None (Preliminary result)   Collection Time: 01/31/23  4:20 PM   Specimen: BLOOD RIGHT HAND  Result Value  Ref Range Status   Specimen Description   Final    BLOOD RIGHT HAND Performed at [facility], [address] [place], [place] [address]    Special Requests   Final    BOTTLES DRAWN AEROBIC ONLY Blood Culture adequate volume Performed at [facility], [address] [place], [place] [address]    Culture   Final    NO GROWTH < 12 HOURS Performed at [facility], [address], [place], [place] [address]    Report Status PENDING  Incomplete         Radiology Studies: CT ABDOMEN PELVIS W CONTRAST  Result Date: 01/31/2023 CLINICAL DATA:  Pancreatitis, acute, severe. EXAM: CT ABDOMEN AND PELVIS WITH CONTRAST TECHNIQUE: Multidetector CT imaging of the abdomen and pelvis was performed using the standard protocol following bolus administration of intravenous contrast. RADIATION DOSE REDUCTION: This exam was performed according to the  departmental dose-optimization program which includes automated exposure control, adjustment of the mA and/or kV according to patient size and/or use of iterative reconstruction technique. CONTRAST:  85mL OMNIPAQUE IOHEXOL 300 MG/ML  SOLN COMPARISON:  CT abdomen/pelvis 04/30/2022. FINDINGS: Lower chest: No acute abnormality. Hepatobiliary: Heterogeneous attenuation in the liver, suggestive of heterogeneous hepatic steatosis. Layering hyperdensity in the gallbladder neck may reflect sludge or small stones. No biliary dilatation. Pancreas: Moderate peripancreatic fat stranding. Focal hypoattenuation of the pancreatic tail, suspicious for necrosis (axial image 25 series 2). Spleen: Normal. Adrenals/Urinary Tract: Adrenal glands are unremarkable. Kidneys are normal, without renal calculi, focal lesion, or hydronephrosis. Bladder is unremarkable. Stomach/Bowel: Moderate fat stranding around the second and third portions of the duodenum. No dilated loops of small bowel. Normal appendix is visualized on axial image 59 series 2. No colon wall thickening or surrounding inflammation. Vascular/Lymphatic: Aortic atherosclerosis. No enlarged abdominal or pelvic lymph nodes. Reproductive: Prostate is unremarkable. Other: No abdominal wall hernia or abnormality. No abdominopelvic ascites. Musculoskeletal: No acute or significant osseous findings. IMPRESSION: 1. Moderate peripancreatic fat stranding, consistent with acute pancreatitis. Focal hypoattenuation of the pancreatic tail, suspicious for necrosis. 2. Heterogeneous attenuation of the liver, suggestive of heterogeneous hepatic steatosis. 3. Layering hyperdensity in the gallbladder neck may reflect sludge or small stones. No biliary dilatation. 4. Aortic Atherosclerosis (ICD10-I70.0). Electronically Signed   By: Orvan Falconer M.D.   On: 01/31/2023 10:57        Scheduled Meds:  buPROPion  150 mg Oral Daily   Chlorhexidine Gluconate Cloth  6 each Topical Daily    enoxaparin (LOVENOX) injection  40 mg Subcutaneous Q24H   multivitamin with minerals  1 tablet Oral Daily   potassium chloride  40 mEq Oral Once   Continuous Infusions:  dextrose 125 mL/hr at 02/01/23 0825   insulin 0.1 Units/kg/hr (02/01/23 0904)     LOS: 1 day    Time spent: 45 minutes    Dorcas Carrow, MD [facility] Pager [phone]

## 2023-02-01 NOTE — TOC CM/SW Note (Signed)
  Transition of Care [facility]) Screening Note   Patient Details  Name: George Howe Date of Birth: 03-12-1986   Transition of Care [facility]) CM/SW Contact:    Armanda Heritage, RN Phone Number: 02/01/2023, 11:42 AM    Transition of Care Department [facility] has reviewed patient and no TOC needs have been identified at this time. We will continue to monitor patient advancement through interdisciplinary progression rounds. If new patient transition needs arise, please place a TOC consult.

## 2023-02-01 NOTE — Progress Notes (Signed)
   02/01/23 1500  Spiritual Encounters  Type of Visit Initial  Care provided to: Patient;Pt not available   Chaplain made attempt to visit patient - who was not available at this time.   Will remain available for visit.

## 2023-02-01 NOTE — Plan of Care (Signed)

## 2023-02-02 DIAGNOSIS — K8591 Acute pancreatitis with uninfected necrosis, unspecified: Secondary | ICD-10-CM | POA: Diagnosis not present

## 2023-02-02 LAB — CBC WITH DIFFERENTIAL/PLATELET
Abs Immature Granulocytes: 0.09 10*3/uL — ABNORMAL HIGH (ref 0.00–0.07)
Basophils Absolute: 0.1 10*3/uL (ref 0.0–0.1)
Basophils Relative: 1 %
Eosinophils Absolute: 0 10*3/uL (ref 0.0–0.5)
Eosinophils Relative: 0 %
HCT: 47.3 % (ref 39.0–52.0)
Hemoglobin: 16.1 g/dL (ref 13.0–17.0)
Immature Granulocytes: 1 %
Lymphocytes Relative: 10 %
Lymphs Abs: 1.4 10*3/uL (ref 0.7–4.0)
MCH: 28.4 pg (ref 26.0–34.0)
MCHC: 34 g/dL (ref 30.0–36.0)
MCV: 83.4 fL (ref 80.0–100.0)
Monocytes Absolute: 1.1 10*3/uL — ABNORMAL HIGH (ref 0.1–1.0)
Monocytes Relative: 8 %
Neutro Abs: 10.9 10*3/uL — ABNORMAL HIGH (ref 1.7–7.7)
Neutrophils Relative %: 80 %
Platelets: 161 10*3/uL (ref 150–400)
RBC: 5.67 MIL/uL (ref 4.22–5.81)
RDW: 13.5 % (ref 11.5–15.5)
WBC: 13.6 10*3/uL — ABNORMAL HIGH (ref 4.0–10.5)
nRBC: 0 % (ref 0.0–0.2)

## 2023-02-02 LAB — COMPREHENSIVE METABOLIC PANEL
ALT: 29 U/L (ref 0–44)
AST: 33 U/L (ref 15–41)
Albumin: 2.8 g/dL — ABNORMAL LOW (ref 3.5–5.0)
Alkaline Phosphatase: 45 U/L (ref 38–126)
Anion gap: 9 (ref 5–15)
BUN: 11 mg/dL (ref 6–20)
CO2: 20 mmol/L — ABNORMAL LOW (ref 22–32)
Calcium: 6.1 mg/dL — CL (ref 8.9–10.3)
Chloride: 98 mmol/L (ref 98–111)
Creatinine, Ser: 1.02 mg/dL (ref 0.61–1.24)
GFR, Estimated: >60 mL/min (ref >60)
Glucose, Bld: 234 mg/dL — ABNORMAL HIGH (ref 70–99)
Potassium: 4 mmol/L (ref 3.5–5.1)
Sodium: 127 mmol/L — ABNORMAL LOW (ref 135–145)
Total Bilirubin: 4.2 mg/dL — ABNORMAL HIGH (ref 0.3–1.2)
Total Protein: 5.6 g/dL — ABNORMAL LOW (ref 6.5–8.1)

## 2023-02-02 LAB — LIPASE, BLOOD: Lipase: 193 U/L — ABNORMAL HIGH (ref 11–51)

## 2023-02-02 LAB — GLUCOSE, CAPILLARY
Glucose-Capillary: 236 mg/dL — ABNORMAL HIGH (ref 70–99)
Glucose-Capillary: 230 mg/dL — ABNORMAL HIGH (ref 70–99)
Glucose-Capillary: 247 mg/dL — ABNORMAL HIGH (ref 70–99)
Glucose-Capillary: 218 mg/dL — ABNORMAL HIGH (ref 70–99)
Glucose-Capillary: 191 mg/dL — ABNORMAL HIGH (ref 70–99)
Glucose-Capillary: 169 mg/dL — ABNORMAL HIGH (ref 70–99)
Glucose-Capillary: 169 mg/dL — ABNORMAL HIGH (ref 70–99)

## 2023-02-02 LAB — CULTURE, BLOOD (ROUTINE X 2): Culture: NO GROWTH

## 2023-02-02 LAB — TRIGLYCERIDES: Triglycerides: 318 mg/dL — ABNORMAL HIGH (ref <150)

## 2023-02-02 LAB — MAGNESIUM: Magnesium: 1.8 mg/dL (ref 1.7–2.4)

## 2023-02-02 LAB — PHOSPHORUS: Phosphorus: 3.1 mg/dL (ref 2.5–4.6)

## 2023-02-02 MED ORDER — ENOXAPARIN SODIUM 40 MG/0.4ML IJ SOSY
40.0000 mg | PREFILLED_SYRINGE | INTRAMUSCULAR | Status: DC
  Administered 2023-02-02 – 2023-02-15 (×14): 40 mg via SUBCUTANEOUS
  Filled 2023-02-02 (×14): qty 0.4

## 2023-02-02 MED ORDER — CALCIUM GLUCONATE-NACL 1-0.675 GM/50ML-% IV SOLN
1.0000 g | Freq: Once | INTRAVENOUS | Status: AC
  Administered 2023-02-02: 1000 mg via INTRAVENOUS
  Filled 2023-02-02: qty 50

## 2023-02-02 MED ORDER — NICOTINE 14 MG/24HR TD PT24
14.0000 mg | MEDICATED_PATCH | Freq: Every day | TRANSDERMAL | Status: DC
  Administered 2023-02-02 – 2023-02-15 (×14): 14 mg via TRANSDERMAL
  Filled 2023-02-02 (×14): qty 1

## 2023-02-02 MED ORDER — INSULIN DETEMIR 100 UNIT/ML ~~LOC~~ SOLN
5.0000 [IU] | Freq: Two times a day (BID) | SUBCUTANEOUS | Status: DC
  Administered 2023-02-02 – 2023-02-15 (×26): 5 [IU] via SUBCUTANEOUS
  Filled 2023-02-02 (×29): qty 0.05

## 2023-02-02 MED ORDER — OXYCODONE HCL 5 MG PO TABS
5.0000 mg | ORAL_TABLET | Freq: Four times a day (QID) | ORAL | Status: DC | PRN
  Administered 2023-02-02 – 2023-02-03 (×5): 5 mg via ORAL
  Filled 2023-02-02 (×5): qty 1

## 2023-02-02 MED ORDER — INSULIN DETEMIR 100 UNIT/ML ~~LOC~~ SOLN: 10.0000 [IU] | Freq: Every day | SUBCUTANEOUS | Status: DC

## 2023-02-02 MED ORDER — HYDROMORPHONE HCL 1 MG/ML IJ SOLN
1.0000 mg | INTRAMUSCULAR | Status: DC | PRN
  Administered 2023-02-02 – 2023-02-07 (×36): 1 mg via INTRAVENOUS
  Filled 2023-02-02 (×36): qty 1

## 2023-02-02 NOTE — Progress Notes (Signed)
PROGRESS NOTE    George Howe  ZHY:[id number] DOB: 01-11-1986 DOA: 01/31/2023 PCP: [name], [name], MD    Brief Narrative:  37 year old with history of type 2 diabetes on insulin, hypertriglyceridemia on Lipitor, previous episodes of pancreatitis secondary to hypertriglyceridemia, chronic anxiety and depression presented with 2 days of epigastric pain and back pain.  In the emergency room triglycerides more than 5000, lipase 653, serum glucose 313 and anion gap of 16.  Clinically diagnosed as pancreatitis, started on IV fluids.  CT scan showed moderate peripancreatic fat stranding consistent with acute pancreatitis and suspicious for necrosis at the pancreatic tail.  Hepatic steatosis.  Admitted with acute pancreatitis.   Assessment & Plan:   Acute pancreatitis secondary to hypertriglyceridemia: Pancreatic tail necrosis. Hemodynamically stable today. Resuscitated with large amount of isotonic fluid and currently stabilizing.  On maintenance IV fluids. Insulin drip and management as below. Some clinical improvement today, will advance to full liquid diet.  Continue adequate oral and IV pain medications. No indication for antibiotics. Mobilize in the hallway.  Severe hypertriglyceridemia causing acute pancreatitis in a patient with type 2 diabetes: Triglyceride 5000-3000-500-300.  Appropriately responded to IV insulin infusion.  Has remained persistently less than 500 last 12 hours.  Clinically improving. On atorvastatin 40 mg at home, resume Started on fenofibrate 160 mg daily Aggressive dietary modification and triglyceride avoidance discussed. Patient will be referred to lipid clinic on discharge.  Insulin-dependent type 2 diabetes, hypoglycemia: Blood sugars are acceptable and elevated now.  Tolerating clears and now advancing to full liquid diet. Gradually increase long-acting insulin, 5 units twice daily, continue sliding scale insulin.  Chronic anxiety depression:  Currently on Wellbutrin.  Will continue.  Smoker: Counseled to quit.  Provide nicotine patch.  Hypercalcemia, replaced.  Recheck tomorrow morning.  Hyponatremia: Dilutional due to use of dextrose.  Continue isotonic normal saline.  Recheck tomorrow morning.  Discussed with surgery at the bedside Patient can transfer out of ICU to progressive care unit.  DVT prophylaxis: enoxaparin (LOVENOX) injection 40 mg Start: 02/02/23 1000   Code Status: Full code Family Communication: None at the bedside Disposition Plan: Status is: Inpatient Remains inpatient appropriate because: Severe pancreatitis, IV fluid, IV pain medications     Consultants:  General surgery  Procedures:  None  Antimicrobials:  None   Subjective: Patient seen and examined.  He does have occasional pain, however denies any nausea or vomiting.  He is tolerating clears.  Advance to full liquid diet.  Passing flatus.  He had 2 loose bowel movements yesterday and a small volume. Multiple questions answered. Asking for nicotine patch. Will add oral pain medications to decrease frequency of need for IV pain medications.  Objective: Vitals:   02/02/23 0900 02/02/23 1000 02/02/23 1016 02/02/23 1100  BP:  (!) 144/89    Pulse: (!) 113 (!) 107  (!) 109  Resp: (!) 21 (!) 26  (!) 23  Temp:   98.7 F (37.1 C)   TempSrc:   Oral   SpO2: 93% 93%  94%  Weight:      Height:        Intake/Output Summary (Last 24 hours) at 02/02/2023 1128 Last data filed at 02/02/2023 1113 Gross per 24 hour  Intake 6118.98 ml  Output 1150 ml  Net 4968.98 ml    Filed Weights   01/31/23 0725 01/31/23 1500  Weight: 97.5 kg 99.6 kg    Examination:  General exam: Appears calm and comfortable at rest today. Respiratory system: Clear to auscultation.  Respiratory effort normal.  No added sounds. Cardiovascular system: S1 & S2 heard, RRR. No pedal edema. Gastrointestinal system: Soft and diffusely tender.  No rigidity or guarding.   Bowel sound present. Central nervous system: Alert and oriented. No focal neurological deficits. Extremities: Symmetric 5 x 5 power. Skin: No rashes, lesions or ulcers Psychiatry: Judgement and insight appear normal. Mood & affect appropriate.     Data Reviewed: I have personally reviewed following labs and imaging studies  CBC: Recent Labs  Lab 01/31/23 0729 01/31/23 1620 02/01/23 0458 02/02/23 0337  WBC 12.1* 12.4* 9.8 13.6*  NEUTROABS  --   --   --  10.9*  HGB 19.4* 18.7* 19.4* 16.1  HCT RESULTS UNAVAILABLE DUE TO INTERFERING SUBSTANCE 50.2 55.1* 47.3  MCV RESULTS UNAVAILABLE DUE TO INTERFERING SUBSTANCE 83.0 82.0 83.4  PLT 239 229 210 161    Basic Metabolic Panel: Recent Labs  Lab 01/31/23 0729 01/31/23 1620 02/01/23 0837 02/02/23 0337  NA 134*  --  132* 127*  K 3.7  --  3.2* 4.0  CL 98  --  103 98  CO2 20*  --  19* 20*  GLUCOSE 313*  --  133* 234*  BUN 10  --  5* 11  CREATININE 1.08 0.97 0.89 1.02  CALCIUM 8.6*  --  5.4* 6.1*  MG  --   --  1.4* 1.8  PHOS  --   --  2.1* 3.1    GFR: Estimated Creatinine Clearance: 106.8 mL/min (by C-G formula based on SCr of 1.02 mg/dL). Liver Function Tests: Recent Labs  Lab 01/31/23 0729 02/01/23 0837 02/02/23 0337  AST 33 39 33  ALT 58* 36 29  ALKPHOS 87 42 45  BILITOT 1.4* 1.8* 4.2*  PROT 6.3* 4.8* 5.6*  ALBUMIN 4.2 2.5* 2.8*    Recent Labs  Lab 01/31/23 0729 02/01/23 0837 02/02/23 0337  LIPASE 653* 417* 193*    No results for input(s): "AMMONIA" in the last 168 hours. Coagulation Profile: No results for input(s): "INR", "PROTIME" in the last 168 hours. Cardiac Enzymes: No results for input(s): "CKTOTAL", "CKMB", "CKMBINDEX", "TROPONINI" in the last 168 hours. BNP (last 3 results) No results for input(s): "PROBNP" in the last 8760 hours. HbA1C: Recent Labs    02/01/23 0458  HGBA1C 10.8*    CBG: Recent Labs  Lab 02/01/23 2005 02/01/23 2346 02/02/23 0323 02/02/23 0449 02/02/23 0824  GLUCAP  179* 225* 236* 230* 247*    Lipid Profile: Recent Labs    02/01/23 1601 02/02/23 0337  TRIG 271* 318*    Thyroid Function Tests: No results for input(s): "TSH", "T4TOTAL", "FREET4", "T3FREE", "THYROIDAB" in the last 72 hours. Anemia Panel: No results for input(s): "VITAMINB12", "FOLATE", "FERRITIN", "TIBC", "IRON", "RETICCTPCT" in the last 72 hours. Sepsis Labs: No results for input(s): "PROCALCITON", "LATICACIDVEN" in the last 168 hours.  Recent Results (from the past 240 hour(s))  MRSA Next Gen by PCR, Nasal     Status: None   Collection Time: 01/31/23  3:17 PM   Specimen: Nasal Mucosa; Nasal Swab  Result Value Ref Range Status   MRSA by PCR Next Gen NOT DETECTED NOT DETECTED Final    Comment: (NOTE) The GeneXpert MRSA Assay (FDA approved for NASAL specimens only), is one component of a comprehensive MRSA colonization surveillance program. It is not intended to diagnose MRSA infection nor to guide or monitor treatment for MRSA infections. Test performance is not FDA approved in patients less than 40 years old. Performed at [facility],  [address] [place], [place] [address]   Culture, blood (Routine X 2) w Reflex to ID Panel     Status: None (Preliminary result)   Collection Time: 01/31/23  4:20 PM   Specimen: BLOOD RIGHT ARM  Result Value Ref Range Status   Specimen Description   Final    BLOOD RIGHT ARM Performed at [facility], [address], [place], [place] [address]    Special Requests   Final    BOTTLES DRAWN AEROBIC ONLY Blood Culture adequate volume Performed at [facility], [address] [place], [place] [address]    Culture   Final    NO GROWTH < 12 HOURS Performed at [facility], [address] [place], [place] [address]    Report Status PENDING  Incomplete  Culture, blood (Routine X 2) w Reflex to ID Panel     Status: None (Preliminary result)   Collection Time: 01/31/23  4:20 PM    Specimen: BLOOD RIGHT HAND  Result Value Ref Range Status   Specimen Description   Final    BLOOD RIGHT HAND Performed at [facility], [address] [place], [place] [address]    Special Requests   Final    BOTTLES DRAWN AEROBIC ONLY Blood Culture adequate volume Performed at [facility], [address], [place], [place] [address]    Culture   Final    NO GROWTH < 12 HOURS Performed at [facility], [address], [place], [place] [address]    Report Status PENDING  Incomplete         Radiology Studies: No results found.      Scheduled Meds:  atorvastatin  40 mg Oral Daily   buPROPion  150 mg Oral Daily   Chlorhexidine Gluconate Cloth  6 each Topical Daily   enoxaparin (LOVENOX) injection  40 mg Subcutaneous Q24H   fenofibrate  160 mg Oral Daily   insulin aspart  0-9 Units Subcutaneous Q4H   insulin detemir  5 Units Subcutaneous BID   multivitamin with minerals  1 tablet Oral Daily   nicotine  14 mg Transdermal Daily   Continuous Infusions:  sodium chloride 125 mL/hr at 02/02/23 1113     LOS: 2 days    Time spent: 45 minutes    Dorcas Carrow, MD [facility] Pager [phone]

## 2023-02-02 NOTE — Progress Notes (Signed)
Patient ID: George Howe, male   DOB: 04-29-1986, 37 y.o.   MRN: [id number]   Acute Care Surgery Service Progress Note:    Chief Complaint/Subjective: Still with abd pain, but a little better No vomiting A little flatus; no burping Asking for nicotine patch TRH at bs  Objective: Vital signs in last 24 hours: Temp:  [98 F (36.7 C)-98.9 F (37.2 C)] 98.6 F (37 C) (05/11 0406) Pulse Rate:  [112-125] 118 (05/11 0600) Resp:  [15-23] 17 (05/11 0600) BP: (121-150)/(72-116) 143/93 (05/11 0600) SpO2:  [88 %-93 %] 92 % (05/11 0600) Last BM Date : 01/31/23  Intake/Output from previous day: 05/10 0701 - 05/11 0700 In: 6769.1 [P.O.:240; I.V.:2921.5; IV Piggyback:3607.6] Out: 1150 [Urine:1150] Intake/Output this shift: No intake/output data recorded.  Lungs: , nonlabored  Cardiovascular: mild tachy  Abd: a little full, TTP throughout upper/mid abd; no rebound  Extremities: no edema, +SCDs  Neuro: alert, nonfocal, nontoxic   Lab Results: CBC  Recent Labs    02/01/23 0458 02/02/23 0337  WBC 9.8 13.6*  HGB 19.4* 16.1  HCT 55.1* 47.3  PLT 210 161   BMET Recent Labs    02/01/23 0837 02/02/23 0337  NA 132* 127*  K 3.2* 4.0  CL 103 98  CO2 19* 20*  GLUCOSE 133* 234*  BUN 5* 11  CREATININE 0.89 1.02  CALCIUM 5.4* 6.1*   LFT    Latest Ref Rng & Units 02/02/2023    3:37 AM 02/01/2023    8:37 AM 01/31/2023    7:29 AM  Hepatic Function  Total Protein 6.5 - 8.1 g/dL 5.6  4.8  6.3   Albumin 3.5 - 5.0 g/dL 2.8  2.5  4.2   AST 15 - 41 U/L 33  39  33   ALT 0 - 44 U/L 29  36  58   Alk Phosphatase 38 - 126 U/L 45  42  87   Total Bilirubin 0.3 - 1.2 mg/dL 4.2  1.8  1.4    PT/INR No results for input(s): "LABPROT", "INR" in the last 72 hours. ABG No results for input(s): "PHART", "HCO3" in the last 72 hours.  Invalid input(s): "PCO2", "PO2"  Studies/Results:  Anti-infectives: Anti-infectives (From admission, onward)    Start     Dose/Rate Route Frequency  Ordered Stop   02/01/23 1300  cefTRIAXone (ROCEPHIN) 2 g in sodium chloride 0.9 % 100 mL IVPB  Status:  Discontinued        2 g 200 mL/hr over 30 Minutes Intravenous Every 24 hours 01/31/23 1607 02/01/23 0748   01/31/23 1215  cefTRIAXone (ROCEPHIN) 2 g in sodium chloride 0.9 % 100 mL IVPB        2 g 200 mL/hr over 30 Minutes Intravenous  Once 01/31/23 1203 01/31/23 1313   01/31/23 1215  metroNIDAZOLE (FLAGYL) IVPB 500 mg  Status:  Discontinued        500 mg 100 mL/hr over 60 Minutes Intravenous Every 8 hours 01/31/23 1203 02/01/23 0748       Medications: Scheduled Meds:  atorvastatin  40 mg Oral Daily   buPROPion  150 mg Oral Daily   Chlorhexidine Gluconate Cloth  6 each Topical Daily   enoxaparin (LOVENOX) injection  40 mg Subcutaneous Q24H   fenofibrate  160 mg Oral Daily   insulin aspart  0-9 Units Subcutaneous Q4H   insulin detemir  5 Units Subcutaneous BID   multivitamin with minerals  1 tablet Oral Daily   nicotine  14 mg Transdermal Daily  Continuous Infusions:  sodium chloride 125 mL/hr at 02/02/23 0407   calcium gluconate     PRN Meds:.acetaminophen, HYDROmorphone (DILAUDID) injection, mouth rinse, oxyCODONE, polyethylene glycol, prochlorperazine  Assessment/Plan: Patient Active Problem List   Diagnosis Date Noted   Acute pancreatitis 01/31/2023   Dizziness 10/10/2022   Carpal tunnel syndrome, left upper limb    Carpal tunnel syndrome, right upper limb    Bilateral carpal tunnel syndrome 08/07/2021   GAD (generalized anxiety disorder) 05/01/2021   MDD (major depressive disorder), recurrent severe, 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  Pancreatic tail hypodensity c/w necrosis Uncontrolled DM 2 Obesity  Can probably advance to FLD and hold. If doesn't tolerate FLD, has worsening pain,  fever, would back diet back down and consider nasojejunal enteral feeds -no role for abx right now - no signs of infected necrosis -ivf, supportive care -chemical vte prophylaxis -outpt referral to lipid clinc -dm control per trh  Disposition:see above  I reviewed vitals for past 24hrs, labs x36hrs, nursing notes, hospitalist note   LOS: 2 days    Mary Sella. Andrey Campanile, MD, FACS General, Bariatric, & Minimally Invasive Surgery [phone] [facility] Surgery, A [facility]

## 2023-02-03 DIAGNOSIS — K8591 Acute pancreatitis with uninfected necrosis, unspecified: Secondary | ICD-10-CM | POA: Diagnosis not present

## 2023-02-03 LAB — CBC WITH DIFFERENTIAL/PLATELET
Abs Immature Granulocytes: 0.03 10*3/uL (ref 0.00–0.07)
Basophils Absolute: 0 10*3/uL (ref 0.0–0.1)
Basophils Relative: 0 %
Eosinophils Absolute: 0 10*3/uL (ref 0.0–0.5)
Eosinophils Relative: 0 %
HCT: 39.1 % (ref 39.0–52.0)
Hemoglobin: 13 g/dL (ref 13.0–17.0)
Immature Granulocytes: 0 %
Lymphocytes Relative: 8 %
Lymphs Abs: 0.8 10*3/uL (ref 0.7–4.0)
MCH: 28.2 pg (ref 26.0–34.0)
MCHC: 33.2 g/dL (ref 30.0–36.0)
MCV: 84.8 fL (ref 80.0–100.0)
Monocytes Absolute: 0.8 10*3/uL (ref 0.1–1.0)
Monocytes Relative: 8 %
Neutro Abs: 8.2 10*3/uL — ABNORMAL HIGH (ref 1.7–7.7)
Neutrophils Relative %: 84 %
Platelets: 141 10*3/uL — ABNORMAL LOW (ref 150–400)
RBC: 4.61 MIL/uL (ref 4.22–5.81)
RDW: 13.6 % (ref 11.5–15.5)
WBC: 9.8 10*3/uL (ref 4.0–10.5)
nRBC: 0 % (ref 0.0–0.2)

## 2023-02-03 LAB — COMPREHENSIVE METABOLIC PANEL
ALT: 21 U/L (ref 0–44)
AST: 24 U/L (ref 15–41)
Albumin: 2.5 g/dL — ABNORMAL LOW (ref 3.5–5.0)
Alkaline Phosphatase: 46 U/L (ref 38–126)
Anion gap: 10 (ref 5–15)
BUN: 12 mg/dL (ref 6–20)
CO2: 22 mmol/L (ref 22–32)
Calcium: 6.5 mg/dL — ABNORMAL LOW (ref 8.9–10.3)
Chloride: 97 mmol/L — ABNORMAL LOW (ref 98–111)
Creatinine, Ser: 0.84 mg/dL (ref 0.61–1.24)
GFR, Estimated: >60 mL/min (ref >60)
Glucose, Bld: 154 mg/dL — ABNORMAL HIGH (ref 70–99)
Potassium: 3.5 mmol/L (ref 3.5–5.1)
Sodium: 129 mmol/L — ABNORMAL LOW (ref 135–145)
Total Bilirubin: 3.7 mg/dL — ABNORMAL HIGH (ref 0.3–1.2)
Total Protein: 5.4 g/dL — ABNORMAL LOW (ref 6.5–8.1)

## 2023-02-03 LAB — TRIGLYCERIDES: Triglycerides: 354 mg/dL — ABNORMAL HIGH (ref <150)

## 2023-02-03 LAB — GLUCOSE, CAPILLARY
Glucose-Capillary: 139 mg/dL — ABNORMAL HIGH (ref 70–99)
Glucose-Capillary: 160 mg/dL — ABNORMAL HIGH (ref 70–99)
Glucose-Capillary: 194 mg/dL — ABNORMAL HIGH (ref 70–99)
Glucose-Capillary: 218 mg/dL — ABNORMAL HIGH (ref 70–99)
Glucose-Capillary: 156 mg/dL — ABNORMAL HIGH (ref 70–99)

## 2023-02-03 LAB — CULTURE, BLOOD (ROUTINE X 2)

## 2023-02-03 LAB — MAGNESIUM: Magnesium: 2.2 mg/dL (ref 1.7–2.4)

## 2023-02-03 LAB — PHOSPHORUS: Phosphorus: 1.4 mg/dL — ABNORMAL LOW (ref 2.5–4.6)

## 2023-02-03 LAB — LIPASE, BLOOD: Lipase: 119 U/L — ABNORMAL HIGH (ref 11–51)

## 2023-02-03 MED ORDER — POTASSIUM PHOSPHATES 15 MMOLE/5ML IV SOLN
20.0000 mmol | Freq: Once | INTRAVENOUS | Status: AC
  Administered 2023-02-03: 20 mmol via INTRAVENOUS
  Filled 2023-02-03: qty 6.67

## 2023-02-03 MED ORDER — CALCIUM GLUCONATE-NACL 1-0.675 GM/50ML-% IV SOLN
1.0000 g | Freq: Once | INTRAVENOUS | Status: AC
  Administered 2023-02-03: 1000 mg via INTRAVENOUS
  Filled 2023-02-03: qty 50

## 2023-02-03 NOTE — Plan of Care (Signed)

## 2023-02-03 NOTE — Progress Notes (Signed)
PROGRESS NOTE    George Howe  ZOX:[id number] DOB: 10/19/1985 DOA: 01/31/2023 PCP: [name], [name], MD    Brief Narrative:  37 year old with history of type 2 diabetes on insulin, hypertriglyceridemia on Lipitor, previous episodes of pancreatitis secondary to hypertriglyceridemia, chronic anxiety and depression presented with 2 days of epigastric pain and back pain.  In the emergency room triglycerides more than 5000, lipase 653, serum glucose 313 and anion gap of 16.  Clinically diagnosed as pancreatitis, started on IV fluids.  CT scan showed moderate peripancreatic fat stranding consistent with acute pancreatitis and suspicious for necrosis at the pancreatic tail.  Hepatic steatosis.  Admitted with acute pancreatitis.   Assessment & Plan:   Acute pancreatitis secondary to hypertriglyceridemia: Pancreatic tail necrosis. Currently hemodynamically stable. Massively resuscitated, renal functions and urine output are adequate. 5/11, advance to full liquid diet and had episode of vomiting. Change to clears today. Continue maintenance isotonic fluid. Adequate IV and oral opiates for pain relief. Walk in the hallway. If unable to have nutrition by tomorrow, will order postpyloric feeding tube. Appreciate surgery team help. See below for triglyceride management.  Severe hypertriglyceridemia causing acute pancreatitis in a patient with type 2 diabetes: Triglyceride 5000-3000-500-300 range.  Appropriately responded to IV insulin infusion.  Has remained persistently less than 500 now after stopping insulin infusion.  On atorvastatin 40 mg at home, resume Started on fenofibrate 160 mg daily Aggressive dietary modification and triglyceride avoidance discussed. Patient will be referred to lipid clinic on discharge.   Insulin-dependent type 2 diabetes, hypoglycemia: Blood sugars are acceptable and elevated now.  long-acting insulin, 5 units twice daily, continue sliding scale  insulin.   Chronic anxiety depression: Currently on Wellbutrin.  Will continue.   Smoker: Counseled to quit.  Provide nicotine patch.   Hypocalcemia, due to acute pancreatitis.  Supplement and recheck levels.  Hypophosphatemia: Replace aggressively.   Hyponatremia: Dilutional due to use of dextrose.  Continue isotonic normal saline.  Stabilizing.  DVT prophylaxis: enoxaparin (LOVENOX) injection 40 mg Start: 02/02/23 1000   Code Status: Full code Family Communication: None at the bedside Disposition Plan: Status is: Inpatient Remains inpatient appropriate because: Severe pancreatitis, IV fluid, IV pain medications     Consultants:  General surgery  Procedures:  None  Antimicrobials:  None   Subjective:  Patient seen and examined.  He had episode of vomiting yesterday after eating broccoli soup.  He felt better after that.  Currently denies any nausea.  Pain is persistent however slightly better than previous days.  Still using around-the-clock pain medications.  Objective: Vitals:   02/02/23 1918 02/02/23 1940 02/02/23 2309 02/03/23 0301  BP: 125/82 (!) 138/90 119/74 (!) 144/82  Pulse: (!) 111 (!) 109 (!) 108 (!) 108  Resp: 20 20 19 20   Temp: 99.2 F (37.3 C) 98.6 F (37 C) 98.7 F (37.1 C) 97.7 F (36.5 C)  TempSrc: Oral Oral Oral Oral  SpO2: 95% 94% 95% 95%  Weight:      Height:        Intake/Output Summary (Last 24 hours) at 02/03/2023 1102 Last data filed at 02/02/2023 2353 Gross per 24 hour  Intake 1836.42 ml  Output --  Net 1836.42 ml   Filed Weights   01/31/23 0725 01/31/23 1500  Weight: 97.5 kg 99.6 kg    Examination:  General exam: Appears calm and comfortable at rest. Respiratory system: Clear to auscultation. Respiratory effort normal.  No added sounds. Cardiovascular system: S1 & S2 heard, RRR. No pedal edema.  Gastrointestinal system: Soft.  Mildly distended.  Mild diffuse tenderness all over without localized rigidity or guarding.  Bowel  sounds sluggish. There is no evidence of ecchymosis or skin changes. Central nervous system: Alert and oriented. No focal neurological deficits. Extremities: Symmetric 5 x 5 power. Skin: No rashes, lesions or ulcers Psychiatry: Judgement and insight appear normal. Mood & affect appropriate.     Data Reviewed: I have personally reviewed following labs and imaging studies  CBC: Recent Labs  Lab 01/31/23 0729 01/31/23 1620 02/01/23 0458 02/02/23 0337 02/03/23 0355  WBC 12.1* 12.4* 9.8 13.6* 9.8  NEUTROABS  --   --   --  10.9* 8.2*  HGB 19.4* 18.7* 19.4* 16.1 13.0  HCT RESULTS UNAVAILABLE DUE TO INTERFERING SUBSTANCE 50.2 55.1* 47.3 39.1  MCV RESULTS UNAVAILABLE DUE TO INTERFERING SUBSTANCE 83.0 82.0 83.4 84.8  PLT 239 229 210 161 141*   Basic Metabolic Panel: Recent Labs  Lab 01/31/23 0729 01/31/23 1620 02/01/23 0837 02/02/23 0337 02/03/23 0355  NA 134*  --  132* 127* 129*  K 3.7  --  3.2* 4.0 3.5  CL 98  --  103 98 97*  CO2 20*  --  19* 20* 22  GLUCOSE 313*  --  133* 234* 154*  BUN 10  --  5* 11 12  CREATININE 1.08 0.97 0.89 1.02 0.84  CALCIUM 8.6*  --  5.4* 6.1* 6.5*  MG  --   --  1.4* 1.8 2.2  PHOS  --   --  2.1* 3.1 1.4*   GFR: Estimated Creatinine Clearance: 129.7 mL/min (by C-G formula based on SCr of 0.84 mg/dL). Liver Function Tests: Recent Labs  Lab 01/31/23 0729 02/01/23 0837 02/02/23 0337 02/03/23 0355  AST 33 39 33 24  ALT 58* 36 29 21  ALKPHOS 87 42 45 46  BILITOT 1.4* 1.8* 4.2* 3.7*  PROT 6.3* 4.8* 5.6* 5.4*  ALBUMIN 4.2 2.5* 2.8* 2.5*   Recent Labs  Lab 01/31/23 0729 02/01/23 0837 02/02/23 0337 02/03/23 0355  LIPASE 653* 417* 193* 119*   No results for input(s): "AMMONIA" in the last 168 hours. Coagulation Profile: No results for input(s): "INR", "PROTIME" in the last 168 hours. Cardiac Enzymes: No results for input(s): "CKTOTAL", "CKMB", "CKMBINDEX", "TROPONINI" in the last 168 hours. BNP (last 3 results) No results for input(s):  "PROBNP" in the last 8760 hours. HbA1C: Recent Labs    02/01/23 0458  HGBA1C 10.8*   CBG: Recent Labs  Lab 02/02/23 1619 02/02/23 1943 02/02/23 2303 02/03/23 0304 02/03/23 0803  GLUCAP 191* 169* 169* 139* 160*   Lipid Profile: Recent Labs    02/02/23 0337 02/03/23 0355  TRIG 318* 354*   Thyroid Function Tests: No results for input(s): "TSH", "T4TOTAL", "FREET4", "T3FREE", "THYROIDAB" in the last 72 hours. Anemia Panel: No results for input(s): "VITAMINB12", "FOLATE", "FERRITIN", "TIBC", "IRON", "RETICCTPCT" in the last 72 hours. Sepsis Labs: No results for input(s): "PROCALCITON", "LATICACIDVEN" in the last 168 hours.  Recent Results (from the past 240 hour(s))  MRSA Next Gen by PCR, Nasal     Status: None   Collection Time: 01/31/23  3:17 PM   Specimen: Nasal Mucosa; Nasal Swab  Result Value Ref Range Status   MRSA by PCR Next Gen NOT DETECTED NOT DETECTED Final    Comment: (NOTE) The GeneXpert MRSA Assay (FDA approved for NASAL specimens only), is one component of a comprehensive MRSA colonization surveillance program. It is not intended to diagnose MRSA infection nor to guide or monitor treatment  for MRSA infections. Test performance is not FDA approved in patients less than 10 years old. Performed at [facility], [address], [place], [place] [address]   Culture, blood (Routine X 2) w Reflex to ID Panel     Status: None (Preliminary result)   Collection Time: 01/31/23  4:20 PM   Specimen: BLOOD RIGHT ARM  Result Value Ref Range Status   Specimen Description   Final    BLOOD RIGHT ARM Performed at [facility], [address] [place], [place] [address]    Special Requests   Final    BOTTLES DRAWN AEROBIC ONLY Blood Culture adequate volume Performed at [facility], [address] [place], [place] [address]    Culture   Final    NO GROWTH 2 DAYS Performed at [facility], [address]  [place], [place] [address]    Report Status PENDING  Incomplete  Culture, blood (Routine X 2) w Reflex to ID Panel     Status: None (Preliminary result)   Collection Time: 01/31/23  4:20 PM   Specimen: BLOOD RIGHT HAND  Result Value Ref Range Status   Specimen Description   Final    BLOOD RIGHT HAND Performed at [facility], [address], [place], [place] [address]    Special Requests   Final    BOTTLES DRAWN AEROBIC ONLY Blood Culture adequate volume Performed at [facility], [address] [place], [place] [address]    Culture   Final    NO GROWTH 2 DAYS Performed at [facility], [address] [place], [place] [address]    Report Status PENDING  Incomplete         Radiology Studies: No results found.      Scheduled Meds:  atorvastatin  40 mg Oral Daily   buPROPion  150 mg Oral Daily   enoxaparin (LOVENOX) injection  40 mg Subcutaneous Q24H   fenofibrate  160 mg Oral Daily   insulin aspart  0-9 Units Subcutaneous Q4H   insulin detemir  5 Units Subcutaneous BID   multivitamin with minerals  1 tablet Oral Daily   nicotine  14 mg Transdermal Daily   Continuous Infusions:  sodium chloride 125 mL/hr at 02/03/23 0336   potassium PHOSPHATE IVPB (in mmol) 20 mmol (02/03/23 0939)     LOS: 3 days    Time spent: 40 minutes    Dorcas Carrow, MD [facility] Pager [phone]

## 2023-02-03 NOTE — Progress Notes (Signed)
   02/03/23 1249  Assess: MEWS Score  Temp 98.9 F (37.2 C)  BP (!) 148/90  MAP (mmHg) 108  Pulse Rate (!) 112  Resp 20  Level of Consciousness Alert  SpO2 96 %  O2 Device Room Air  Patient Activity (if Appropriate) In bed  Assess: MEWS Score  MEWS Temp 0  MEWS Systolic 0  MEWS Pulse 2  MEWS RR 0  MEWS LOC 0  MEWS Score 2  MEWS Score Color Yellow  Assess: if the MEWS score is Yellow or Red  Were vital signs taken at a resting state? Yes  Focused Assessment No change from prior assessment  Does the patient meet 2 or more of the SIRS criteria? No  MEWS guidelines implemented  No, previously yellow, continue vital signs every 4 hours  Notify: Charge Nurse/RN  Name of Charge Nurse/RN Notified Wadie Lessen, RN  Assess: SIRS CRITERIA  SIRS Temperature  0  SIRS Pulse 1  SIRS Respirations  0  SIRS WBC 0  SIRS Score Sum  1

## 2023-02-04 DIAGNOSIS — K8591 Acute pancreatitis with uninfected necrosis, unspecified: Secondary | ICD-10-CM | POA: Diagnosis not present

## 2023-02-04 LAB — COMPREHENSIVE METABOLIC PANEL
ALT: 18 U/L (ref 0–44)
AST: 29 U/L (ref 15–41)
Albumin: 1.9 g/dL — ABNORMAL LOW (ref 3.5–5.0)
Alkaline Phosphatase: 40 U/L (ref 38–126)
Anion gap: 6 (ref 5–15)
BUN: 7 mg/dL (ref 6–20)
CO2: 19 mmol/L — ABNORMAL LOW (ref 22–32)
Calcium: 5.4 mg/dL — CL (ref 8.9–10.3)
Chloride: 109 mmol/L (ref 98–111)
Creatinine, Ser: 0.53 mg/dL — ABNORMAL LOW (ref 0.61–1.24)
GFR, Estimated: >60 mL/min (ref >60)
Glucose, Bld: 84 mg/dL (ref 70–99)
Potassium: 2.7 mmol/L — CL (ref 3.5–5.1)
Sodium: 134 mmol/L — ABNORMAL LOW (ref 135–145)
Total Bilirubin: 3.7 mg/dL — ABNORMAL HIGH (ref 0.3–1.2)
Total Protein: 4.2 g/dL — ABNORMAL LOW (ref 6.5–8.1)

## 2023-02-04 LAB — CBC WITH DIFFERENTIAL/PLATELET
Abs Immature Granulocytes: 0.08 10*3/uL — ABNORMAL HIGH (ref 0.00–0.07)
Basophils Absolute: 0 10*3/uL (ref 0.0–0.1)
Basophils Relative: 0 %
Eosinophils Absolute: 0 10*3/uL (ref 0.0–0.5)
Eosinophils Relative: 0 %
HCT: 32.5 % — ABNORMAL LOW (ref 39.0–52.0)
Hemoglobin: 10.6 g/dL — ABNORMAL LOW (ref 13.0–17.0)
Immature Granulocytes: 1 %
Lymphocytes Relative: 9 %
Lymphs Abs: 1 10*3/uL (ref 0.7–4.0)
MCH: 28.9 pg (ref 26.0–34.0)
MCHC: 32.6 g/dL (ref 30.0–36.0)
MCV: 88.6 fL (ref 80.0–100.0)
Monocytes Absolute: 1.2 10*3/uL — ABNORMAL HIGH (ref 0.1–1.0)
Monocytes Relative: 11 %
Neutro Abs: 8.6 10*3/uL — ABNORMAL HIGH (ref 1.7–7.7)
Neutrophils Relative %: 79 %
Platelets: 109 10*3/uL — ABNORMAL LOW (ref 150–400)
RBC: 3.67 MIL/uL — ABNORMAL LOW (ref 4.22–5.81)
RDW: 13.6 % (ref 11.5–15.5)
WBC: 10.9 10*3/uL — ABNORMAL HIGH (ref 4.0–10.5)
nRBC: 0 % (ref 0.0–0.2)

## 2023-02-04 LAB — TRIGLYCERIDES: Triglycerides: 166 mg/dL — ABNORMAL HIGH (ref <150)

## 2023-02-04 LAB — GLUCOSE, CAPILLARY
Glucose-Capillary: 104 mg/dL — ABNORMAL HIGH (ref 70–99)
Glucose-Capillary: 110 mg/dL — ABNORMAL HIGH (ref 70–99)
Glucose-Capillary: 117 mg/dL — ABNORMAL HIGH (ref 70–99)
Glucose-Capillary: 141 mg/dL — ABNORMAL HIGH (ref 70–99)
Glucose-Capillary: 167 mg/dL — ABNORMAL HIGH (ref 70–99)
Glucose-Capillary: 194 mg/dL — ABNORMAL HIGH (ref 70–99)
Glucose-Capillary: 195 mg/dL — ABNORMAL HIGH (ref 70–99)
Glucose-Capillary: 218 mg/dL — ABNORMAL HIGH (ref 70–99)

## 2023-02-04 LAB — PHOSPHORUS: Phosphorus: 1.5 mg/dL — ABNORMAL LOW (ref 2.5–4.6)

## 2023-02-04 LAB — LIPASE, BLOOD: Lipase: 69 U/L — ABNORMAL HIGH (ref 11–51)

## 2023-02-04 LAB — MAGNESIUM: Magnesium: 1.5 mg/dL — ABNORMAL LOW (ref 1.7–2.4)

## 2023-02-04 MED ORDER — MAGNESIUM SULFATE 4 GM/100ML IV SOLN
4.0000 g | Freq: Once | INTRAVENOUS | Status: AC
  Administered 2023-02-04: 4 g via INTRAVENOUS
  Filled 2023-02-04: qty 100

## 2023-02-04 MED ORDER — CALCIUM GLUCONATE-NACL 2-0.675 GM/100ML-% IV SOLN
2.0000 g | Freq: Once | INTRAVENOUS | Status: AC
  Administered 2023-02-04: 2000 mg via INTRAVENOUS
  Filled 2023-02-04: qty 100

## 2023-02-04 MED ORDER — POTASSIUM PHOSPHATES 15 MMOLE/5ML IV SOLN
30.0000 mmol | Freq: Once | INTRAVENOUS | Status: AC
  Administered 2023-02-04: 30 mmol via INTRAVENOUS
  Filled 2023-02-04: qty 10

## 2023-02-04 MED ORDER — ACETAMINOPHEN 500 MG PO TABS
500.0000 mg | ORAL_TABLET | Freq: Four times a day (QID) | ORAL | Status: AC | PRN
  Administered 2023-02-04 – 2023-02-05 (×4): 500 mg via ORAL
  Filled 2023-02-04 (×4): qty 1

## 2023-02-04 MED ORDER — POTASSIUM CHLORIDE CRYS ER 20 MEQ PO TBCR
20.0000 meq | EXTENDED_RELEASE_TABLET | Freq: Three times a day (TID) | ORAL | Status: AC
  Administered 2023-02-04 – 2023-02-05 (×4): 20 meq via ORAL
  Filled 2023-02-04 (×4): qty 1

## 2023-02-04 MED ORDER — POTASSIUM CHLORIDE 20 MEQ PO PACK
60.0000 meq | PACK | Freq: Once | ORAL | Status: DC
  Filled 2023-02-04: qty 3

## 2023-02-04 MED ORDER — OXYCODONE HCL 5 MG PO TABS: 10.0000 mg | ORAL_TABLET | Freq: Four times a day (QID) | ORAL | Status: AC | PRN

## 2023-02-04 MED ORDER — ENSURE ENLIVE PO LIQD
237.0000 mL | Freq: Three times a day (TID) | ORAL | Status: DC
  Administered 2023-02-04 – 2023-02-09 (×12): 237 mL via ORAL

## 2023-02-04 NOTE — Progress Notes (Signed)
Patient ID: George Howe, male   DOB: 10-05-1985, 37 y.o.   MRN: [id number]   Acute Care Surgery Service Progress Note:    Chief Complaint/Subjective: States his pain is overall stable and is unchanged by PO intake. Majority of his pain is on the left side of his abdomen but also feels it in his epigastric region and back. Reports one episode of emesis after tomato soup + icee. After that he tolerated jello and broth without any issues. He is having bowel function and denies urinary sxs. States his last real PO intake was thursday  Objective: Vital signs in last 24 hours: Temp:  [98 F (36.7 C)-99.1 F (37.3 C)] 98.1 F (36.7 C) (05/13 0409) Pulse Rate:  [104-112] 104 (05/13 0409) Resp:  [17-20] 18 (05/13 0409) BP: (148-162)/(90-111) 149/111 (05/13 0409) SpO2:  [95 %-99 %] 99 % (05/13 0409) Last BM Date : 02/03/23  Intake/Output from previous day: 05/12 0701 - 05/13 0700 In: 2955.2 [P.O.:240; I.V.:2215.2; IV Piggyback:500] Out: -  Intake/Output this shift: No intake/output data recorded.  Lungs: , nonlabored  Cardiovascular: mild tachy (104-109)  Abd: a little full, TTP throughout upper/mid abd; no rebound  Extremities: no edema, +SCDs  Neuro: alert, nonfocal, nontoxic   Lab Results: CBC  Recent Labs    02/03/23 0355 02/04/23 0410  WBC 9.8 10.9*  HGB 13.0 10.6*  HCT 39.1 32.5*  PLT 141* 109*   BMET Recent Labs    02/03/23 0355 02/04/23 0410  NA 129* 134*  K 3.5 2.7*  CL 97* 109  CO2 22 19*  GLUCOSE 154* 84  BUN 12 7  CREATININE 0.84 0.53*  CALCIUM 6.5* 5.4*   LFT    Latest Ref Rng & Units 02/04/2023    4:10 AM 02/03/2023    3:55 AM 02/02/2023    3:37 AM  Hepatic Function  Total Protein 6.5 - 8.1 g/dL 4.2  5.4  5.6   Albumin 3.5 - 5.0 g/dL 1.9  2.5  2.8   AST 15 - 41 U/L 29  24  33   ALT 0 - 44 U/L 18  21  29    Alk Phosphatase 38 - 126 U/L 40  46  45   Total Bilirubin 0.3 - 1.2 mg/dL 3.7  3.7  4.2    PT/INR No results for input(s): "LABPROT",  "INR" in the last 72 hours. ABG No results for input(s): "PHART", "HCO3" in the last 72 hours.  Invalid input(s): "PCO2", "PO2"  Studies/Results:  Anti-infectives: Anti-infectives (From admission, onward)    Start     Dose/Rate Route Frequency Ordered Stop   02/01/23 1300  cefTRIAXone (ROCEPHIN) 2 g in sodium chloride 0.9 % 100 mL IVPB  Status:  Discontinued        2 g 200 mL/hr over 30 Minutes Intravenous Every 24 hours 01/31/23 1607 02/01/23 0748   01/31/23 1215  cefTRIAXone (ROCEPHIN) 2 g in sodium chloride 0.9 % 100 mL IVPB        2 g 200 mL/hr over 30 Minutes Intravenous  Once 01/31/23 1203 01/31/23 1313   01/31/23 1215  metroNIDAZOLE (FLAGYL) IVPB 500 mg  Status:  Discontinued        500 mg 100 mL/hr over 60 Minutes Intravenous Every 8 hours 01/31/23 1203 02/01/23 0748       Medications: Scheduled Meds:  atorvastatin  40 mg Oral Daily   buPROPion  150 mg Oral Daily   enoxaparin (LOVENOX) injection  40 mg Subcutaneous Q24H   feeding supplement  237 mL Oral TID WC   fenofibrate  160 mg Oral Daily   insulin aspart  0-9 Units Subcutaneous Q4H   insulin detemir  5 Units Subcutaneous BID   multivitamin with minerals  1 tablet Oral Daily   nicotine  14 mg Transdermal Daily   Continuous Infusions:  sodium chloride 125 mL/hr at 02/04/23 0209   calcium gluconate 2,000 mg (02/04/23 0817)   magnesium sulfate bolus IVPB     PRN Meds:.acetaminophen, HYDROmorphone (DILAUDID) injection, mouth rinse, oxyCODONE, polyethylene glycol, prochlorperazine  Assessment/Plan: Patient Active Problem List   Diagnosis Date Noted   Acute pancreatitis 01/31/2023   Dizziness 10/10/2022   Carpal tunnel syndrome, left upper limb    Carpal tunnel syndrome, right upper limb    Bilateral carpal tunnel syndrome 08/07/2021   GAD (generalized anxiety disorder) 05/01/2021   MDD (major depressive disorder), recurrent severe, 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  Pancreatic tail hypodensity c/w necrosis Uncontrolled DM 2 Obesity  Ok to re-attempt FLD and hold. If doesn't tolerate FLD, has worsening pain, fever, would back diet back down and start nasojejunal enteral feeds. This plan was discussed with the patient.  -no role for abx right now - no signs of infected necrosis -ivf, supportive care -chemical vte prophylaxis -outpt referral to lipid clinc -dm control per trh  No emergent surgical needs. Diet management per TRH/GI if needed. Follow up with Dr. Freida Busman in our office in 6 weeks. Amb referral to lipid clinic placed.  I reviewed vitals for past 24hrs, labs x36hrs, nursing notes, hospitalist note   LOS: 4 days    Hosie Spangle, PA-C General & Trauma Surgery  [phone] [facility] Surgery, [facility]

## 2023-02-04 NOTE — Progress Notes (Signed)
Chaplain engaged in an initial visit with George Howe. Chaplain provided Scientist, water quality, Healthcare POA, education. Gala Romney would like to complete it later with his dad. Chaplain let him know to have her paged if he wants to complete it or needs any additional support.   George Howe spent time talking about his healthcare journey and what brought him into the hospital. He also highlighted the great care that his physician has offered.  Chaplain Shaquanda Graves, MDiv  02/04/23 1200  Spiritual Encounters  Type of Visit Initial  Care provided to: Pt and family  Reason for visit Advance directives  Spiritual Framework  Presenting Themes Goals in life/care;Community and relationships;Impactful experiences and emotions  Interventions  Spiritual Care Interventions Made Established relationship of care and support;Decision-making support/facilitation;Encouragement  Intervention Outcomes  Outcomes Connection to spiritual care;Awareness of support

## 2023-02-04 NOTE — Progress Notes (Signed)
PROGRESS NOTE    George Howe  WUJ:[id number] DOB: [birth year] DOA: 01/31/2023 PCP: [name], [name], MD    Brief Narrative:  37 year old with history of type 2 diabetes on insulin, hypertriglyceridemia on Lipitor, previous episodes of pancreatitis secondary to hypertriglyceridemia, chronic anxiety and depression presented with 2 days of epigastric pain and back pain.  In the emergency room triglycerides more than 5000, lipase 653, serum glucose 313 and anion gap of 16.  Clinically diagnosed as pancreatitis, started on IV fluids.  CT scan showed moderate peripancreatic fat stranding consistent with acute pancreatitis and suspicious for necrosis at the pancreatic tail.  Hepatic steatosis.  Admitted with acute pancreatitis.  Assessment & Plan:   Acute pancreatitis secondary to hypertriglyceridemia: Pancreatic tail necrosis. Currently hemodynamically stable. Massively resuscitated, renal functions and urine output are adequate. 5/11, advance to full liquid diet and had episode of vomiting and changed to clears. 5/13, challenged with full liquid diet today.  Lipase normalized to 69. Continue maintenance isotonic fluid. Adequate IV and oral opiates for pain relief. Walk in the hallway. If unable to have nutrition by tomorrow, will order postpyloric feeding tube. Appreciate surgery team help. See below for triglyceride management.  Severe hypertriglyceridemia causing acute pancreatitis in a patient with type 2 diabetes: Triglyceride 5000-3000-500-300 range.  Appropriately responded to IV insulin infusion.  Has remained stable even after stopping IV insulin.  Triglycerides 166 today.  On atorvastatin 40 mg at home, resume Started on fenofibrate 160 mg daily Aggressive dietary modification and triglyceride avoidance discussed. Patient will be referred to lipid clinic on discharge.   Insulin-dependent type 2 diabetes, hypoglycemia: Blood sugars are acceptable now. long-acting  insulin, 5 units twice daily, continue sliding scale insulin.   Chronic anxiety depression: Currently on Wellbutrin.  Will continue.   Smoker: Counseled to quit.  Provide nicotine patch.   Hypocalcemia, due to acute pancreatitis.  Supplement and recheck levels.  Replaced with 2 g calcium gluconate today.  Hypophosphatemia: Replace aggressively.  Recheck levels.  Hypomagnesemia: Replace aggressively.  Recheck levels.  Hypokalemia: Replace aggressively.  Recheck levels.   Hyponatremia: Dilutional due to use of dextrose.  Continue isotonic normal saline.  Stabilizing.  DVT prophylaxis: enoxaparin (LOVENOX) injection 40 mg Start: 02/02/23 1000   Code Status: Full code Family Communication: None at the bedside Disposition Plan: Status is: Inpatient Remains inpatient appropriate because: Severe pancreatitis, IV fluid, IV pain medications, severe electrolyte abnormalities.     Consultants:  General surgery  Procedures:  None  Antimicrobials:  None   Subjective:  Patient seen and examined.  Remains about the same.  Ready to try some full liquid diet.  Does have persistent abdominal pain mostly in the left flank and epigastrium. Oxycodone does not relieve the pain so he is using Dilaudid.  Will increase dose of oxycodone to 10 mg every 6 hours as needed.  Passing flatus.  Walked around the hallway.  Objective: Vitals:   02/03/23 1249 02/03/23 1631 02/04/23 0054 02/04/23 0409  BP: (!) 148/90 (!) 159/103 (!) 162/96 (!) 149/111  Pulse: (!) 112 (!) 110 (!) 109 (!) 104  Resp: 20 17 19 18   Temp: 98.9 F (37.2 C) 99.1 F (37.3 C) 98 F (36.7 C) 98.1 F (36.7 C)  TempSrc: Oral Oral Oral Oral  SpO2: 96% 95% 95% 99%  Weight:      Height:        Intake/Output Summary (Last 24 hours) at 02/04/2023 1038 Last data filed at 02/04/2023 0300 Gross per 24 hour  Intake  2955.17 ml  Output --  Net 2955.17 ml    Filed Weights   01/31/23 0725 01/31/23 1500  Weight: 97.5 kg 99.6 kg     Examination:  General exam: Appears calm at rest.  Mostly comfortable. Respiratory system: Clear to auscultation. Respiratory effort normal.  No added sounds. Cardiovascular system: S1 & S2 heard, RRR. No pedal edema. Gastrointestinal system: Soft.  Mildly distended.  Bowel sounds present.  Mild tenderness along the epigastrium and left lateral quadrant.  No rigidity or guarding. There is no evidence of ecchymosis or skin changes. Central nervous system: Alert and oriented. No focal neurological deficits. Extremities: Symmetric 5 x 5 power. Skin: No rashes, lesions or ulcers Psychiatry: Judgement and insight appear normal. Mood & affect appropriate.     Data Reviewed: I have personally reviewed following labs and imaging studies  CBC: Recent Labs  Lab 01/31/23 1620 02/01/23 0458 02/02/23 0337 02/03/23 0355 02/04/23 0410  WBC 12.4* 9.8 13.6* 9.8 10.9*  NEUTROABS  --   --  10.9* 8.2* 8.6*  HGB 18.7* 19.4* 16.1 13.0 10.6*  HCT 50.2 55.1* 47.3 39.1 32.5*  MCV 83.0 82.0 83.4 84.8 88.6  PLT 229 210 161 141* 109*    Basic Metabolic Panel: Recent Labs  Lab 01/31/23 0729 01/31/23 1620 02/01/23 0837 02/02/23 0337 02/03/23 0355 02/04/23 0410  NA 134*  --  132* 127* 129* 134*  K 3.7  --  3.2* 4.0 3.5 2.7*  CL 98  --  103 98 97* 109  CO2 20*  --  19* 20* 22 19*  GLUCOSE 313*  --  133* 234* 154* 84  BUN 10  --  5* 11 12 7   CREATININE 1.08 0.97 0.89 1.02 0.84 0.53*  CALCIUM 8.6*  --  5.4* 6.1* 6.5* 5.4*  MG  --   --  1.4* 1.8 2.2 1.5*  PHOS  --   --  2.1* 3.1 1.4* 1.5*    GFR: Estimated Creatinine Clearance: 136.1 mL/min (A) (by C-G formula based on SCr of 0.53 mg/dL (L)). Liver Function Tests: Recent Labs  Lab 01/31/23 0729 02/01/23 0837 02/02/23 0337 02/03/23 0355 02/04/23 0410  AST 33 39 33 24 29  ALT 58* 36 29 21 18   ALKPHOS 87 42 45 46 40  BILITOT 1.4* 1.8* 4.2* 3.7* 3.7*  PROT 6.3* 4.8* 5.6* 5.4* 4.2*  ALBUMIN 4.2 2.5* 2.8* 2.5* 1.9*    Recent Labs   Lab 01/31/23 0729 02/01/23 0837 02/02/23 0337 02/03/23 0355 02/04/23 0410  LIPASE 653* 417* 193* 119* 69*    No results for input(s): "AMMONIA" in the last 168 hours. Coagulation Profile: No results for input(s): "INR", "PROTIME" in the last 168 hours. Cardiac Enzymes: No results for input(s): "CKTOTAL", "CKMB", "CKMBINDEX", "TROPONINI" in the last 168 hours. BNP (last 3 results) No results for input(s): "PROBNP" in the last 8760 hours. HbA1C: No results for input(s): "HGBA1C" in the last 72 hours.  CBG: Recent Labs  Lab 02/03/23 1615 02/03/23 2007 02/04/23 0056 02/04/23 0410 02/04/23 0803  GLUCAP 218* 156* 141* 104* 117*    Lipid Profile: Recent Labs    02/03/23 0355 02/04/23 0410  TRIG 354* 166*    Thyroid Function Tests: No results for input(s): "TSH", "T4TOTAL", "FREET4", "T3FREE", "THYROIDAB" in the last 72 hours. Anemia Panel: No results for input(s): "VITAMINB12", "FOLATE", "FERRITIN", "TIBC", "IRON", "RETICCTPCT" in the last 72 hours. Sepsis Labs: No results for input(s): "PROCALCITON", "LATICACIDVEN" in the last 168 hours.  Recent Results (from the past 240 hour(s))  MRSA Next Gen by PCR, Nasal     Status: None   Collection Time: 01/31/23  3:17 PM   Specimen: Nasal Mucosa; Nasal Swab  Result Value Ref Range Status   MRSA by PCR Next Gen NOT DETECTED NOT DETECTED Final    Comment: (NOTE) The GeneXpert MRSA Assay (FDA approved for NASAL specimens only), is one component of a comprehensive MRSA colonization surveillance program. It is not intended to diagnose MRSA infection nor to guide or monitor treatment for MRSA infections. Test performance is not FDA approved in patients less than 53 years old. Performed at [facility], [address], [place], [place] [address]   Culture, blood (Routine X 2) w Reflex to ID Panel     Status: None (Preliminary result)   Collection Time: 01/31/23  4:20 PM   Specimen: BLOOD RIGHT ARM  Result  Value Ref Range Status   Specimen Description   Final    BLOOD RIGHT ARM Performed at [facility], [address], [place], [place] [address]    Special Requests   Final    BOTTLES DRAWN AEROBIC ONLY Blood Culture adequate volume Performed at [facility], [address], [place], [place] [address]    Culture   Final    NO GROWTH 4 DAYS Performed at [facility], [address], [place], [place] [address]    Report Status PENDING  Incomplete  Culture, blood (Routine X 2) w Reflex to ID Panel     Status: None (Preliminary result)   Collection Time: 01/31/23  4:20 PM   Specimen: BLOOD RIGHT HAND  Result Value Ref Range Status   Specimen Description   Final    BLOOD RIGHT HAND Performed at [facility], [address] [place], [place] [address]    Special Requests   Final    BOTTLES DRAWN AEROBIC ONLY Blood Culture adequate volume Performed at [facility], [address] [place], [place] [address]    Culture   Final    NO GROWTH 4 DAYS Performed at [facility], [address], [place], [place] [address]    Report Status PENDING  Incomplete         Radiology Studies: No results found.      Scheduled Meds:  atorvastatin  40 mg Oral Daily   buPROPion  150 mg Oral Daily   enoxaparin (LOVENOX) injection  40 mg Subcutaneous Q24H   feeding supplement  237 mL Oral TID WC   fenofibrate  160 mg Oral Daily   insulin aspart  0-9 Units Subcutaneous Q4H   insulin detemir  5 Units Subcutaneous BID   multivitamin with minerals  1 tablet Oral Daily   nicotine  14 mg Transdermal Daily   potassium chloride  60 mEq Oral Once   potassium chloride  20 mEq Oral TID   Continuous Infusions:  sodium chloride 125 mL/hr at 02/04/23 0209   magnesium sulfate bolus IVPB 4 g (02/04/23 0951)   potassium PHOSPHATE IVPB (in mmol)       LOS: 4 days    Time spent: 40 minutes    Dorcas Carrow, MD [facility] Pager [phone]

## 2023-02-04 NOTE — Progress Notes (Signed)
MD was made aware about patient's elevated BP. Staff checked it twice. The first time it was 166/98 and the second time it was 165/100. The elevated BP is more than likely due to pain, which RN has been giving patient PRN pain medication.

## 2023-02-04 NOTE — Plan of Care (Signed)

## 2023-02-05 DIAGNOSIS — K8591 Acute pancreatitis with uninfected necrosis, unspecified: Secondary | ICD-10-CM | POA: Diagnosis not present

## 2023-02-05 LAB — COMPREHENSIVE METABOLIC PANEL
ALT: 53 U/L — ABNORMAL HIGH (ref 0–44)
AST: 90 U/L — ABNORMAL HIGH (ref 15–41)
Albumin: 2.7 g/dL — ABNORMAL LOW (ref 3.5–5.0)
Alkaline Phosphatase: 82 U/L (ref 38–126)
Anion gap: 10 (ref 5–15)
BUN: 9 mg/dL (ref 6–20)
CO2: 28 mmol/L (ref 22–32)
Calcium: 8.3 mg/dL — ABNORMAL LOW (ref 8.9–10.3)
Chloride: 96 mmol/L — ABNORMAL LOW (ref 98–111)
Creatinine, Ser: 0.77 mg/dL (ref 0.61–1.24)
GFR, Estimated: >60 mL/min (ref >60)
Glucose, Bld: 147 mg/dL — ABNORMAL HIGH (ref 70–99)
Potassium: 4.2 mmol/L (ref 3.5–5.1)
Sodium: 134 mmol/L — ABNORMAL LOW (ref 135–145)
Total Bilirubin: 6.4 mg/dL — ABNORMAL HIGH (ref 0.3–1.2)
Total Protein: 6.7 g/dL (ref 6.5–8.1)

## 2023-02-05 LAB — CBC WITH DIFFERENTIAL/PLATELET
Abs Immature Granulocytes: 0.26 10*3/uL — ABNORMAL HIGH (ref 0.00–0.07)
Basophils Absolute: 0 10*3/uL (ref 0.0–0.1)
Basophils Relative: 0 %
Eosinophils Absolute: 0 10*3/uL (ref 0.0–0.5)
Eosinophils Relative: 0 %
HCT: 42.3 % (ref 39.0–52.0)
Hemoglobin: 14.1 g/dL (ref 13.0–17.0)
Immature Granulocytes: 2 %
Lymphocytes Relative: 8 %
Lymphs Abs: 1.2 10*3/uL (ref 0.7–4.0)
MCH: 28.5 pg (ref 26.0–34.0)
MCHC: 33.3 g/dL (ref 30.0–36.0)
MCV: 85.5 fL (ref 80.0–100.0)
Monocytes Absolute: 1.6 10*3/uL — ABNORMAL HIGH (ref 0.1–1.0)
Monocytes Relative: 11 %
Neutro Abs: 11.5 10*3/uL — ABNORMAL HIGH (ref 1.7–7.7)
Neutrophils Relative %: 79 %
Platelets: 166 10*3/uL (ref 150–400)
RBC: 4.95 MIL/uL (ref 4.22–5.81)
RDW: 13.7 % (ref 11.5–15.5)
WBC: 14.6 10*3/uL — ABNORMAL HIGH (ref 4.0–10.5)
nRBC: 0 % (ref 0.0–0.2)

## 2023-02-05 LAB — LIPASE, BLOOD: Lipase: 77 U/L — ABNORMAL HIGH (ref 11–51)

## 2023-02-05 LAB — PHOSPHORUS: Phosphorus: 3 mg/dL (ref 2.5–4.6)

## 2023-02-05 LAB — GLUCOSE, CAPILLARY
Glucose-Capillary: 142 mg/dL — ABNORMAL HIGH (ref 70–99)
Glucose-Capillary: 137 mg/dL — ABNORMAL HIGH (ref 70–99)
Glucose-Capillary: 204 mg/dL — ABNORMAL HIGH (ref 70–99)
Glucose-Capillary: 171 mg/dL — ABNORMAL HIGH (ref 70–99)
Glucose-Capillary: 133 mg/dL — ABNORMAL HIGH (ref 70–99)
Glucose-Capillary: 137 mg/dL — ABNORMAL HIGH (ref 70–99)

## 2023-02-05 LAB — CULTURE, BLOOD (ROUTINE X 2)
Culture: NO GROWTH
Special Requests: ADEQUATE

## 2023-02-05 LAB — MAGNESIUM: Magnesium: 2 mg/dL (ref 1.7–2.4)

## 2023-02-05 LAB — TRIGLYCERIDES: Triglycerides: 199 mg/dL — ABNORMAL HIGH (ref <150)

## 2023-02-05 MED ORDER — LABETALOL HCL 5 MG/ML IV SOLN: 10.0000 mg | INTRAVENOUS | Status: DC | PRN

## 2023-02-05 NOTE — Progress Notes (Signed)
PROGRESS NOTE    George Howe  ZOX:[id number] DOB: 10/20/1985 DOA: 01/31/2023 PCP: [name], [name], MD    Brief Narrative:  37 year old with history of type 2 diabetes on insulin, hypertriglyceridemia on Lipitor, previous episodes of pancreatitis secondary to hypertriglyceridemia, chronic anxiety and depression presented with 2 days of epigastric pain and back pain.  In the emergency room triglycerides more than 5000, lipase 653, serum glucose 313 and anion gap of 16.  Clinically diagnosed as pancreatitis, started on IV fluids.  CT scan showed moderate peripancreatic fat stranding consistent with acute pancreatitis and suspicious for necrosis at the pancreatic tail.  Hepatic steatosis.  Admitted with acute pancreatitis.  Assessment & Plan:   Acute pancreatitis secondary to hypertriglyceridemia: Pancreatic tail necrosis. Currently hemodynamically stable. Massively resuscitated, renal functions and urine output are adequate. 5/11, advance to full liquid diet and had episode of vomiting and changed to clears. 5/14, tolerating full liquid diet.  Lipase trending down. Continue maintenance isotonic fluid, decrease to 50 mill per hour Adequate IV and oral opiates for pain relief. Walk in the hallway. If able to tolerate full liquid diet all day, will advance to soft diet by evening. See below for triglyceride management.  Severe hypertriglyceridemia causing acute pancreatitis in a patient with type 2 diabetes: Triglyceride 5000-3000-500-300-200-199.  Range.  Appropriately responded to IV insulin infusion.  Has remained stable even after stopping IV insulin.  Triglycerides 199. On atorvastatin 40 mg at home, resume Started on fenofibrate 160 mg daily Aggressive dietary modification and triglyceride avoidance discussed. Patient will be referred to lipid clinic on discharge.   Insulin-dependent type 2 diabetes, hypoglycemia: Blood sugars are acceptable now. long-acting insulin, 5  units twice daily, continue sliding scale insulin.   Chronic anxiety depression: Currently on Wellbutrin.  Will continue.   Smoker: Counseled to quit.  Provide nicotine patch.   Hypocalcemia, due to acute pancreatitis.  Aggressively replaced.  Adequate today.  Hypophosphatemia: Replace aggressively.  Adequate today.  Hypomagnesemia: Replace aggressively.  Adequate today.  Hypokalemia: Replace aggressively.  Adequate today.   Hyponatremia: Dilutional due to use of dextrose.  Continue isotonic normal saline.  Stabilizing.  DVT prophylaxis: enoxaparin (LOVENOX) injection 40 mg Start: 02/02/23 1000   Code Status: Full code Family Communication: None at the bedside Disposition Plan: Status is: Inpatient Remains inpatient appropriate because: Severe pancreatitis, IV fluid, IV pain medications, severe electrolyte abnormalities.     Consultants:  General surgery  Procedures:  None  Antimicrobials:  None   Subjective:  Patient seen and examined.  Looks comfortable today.  He took a walk.  He had 2 bowel movements overnight.  Denies any nausea.  Tolerating mostly liquids.  Pain persist 5 or 6 out of 10 however he is scared to use oxycodone.  Objective: Vitals:   02/04/23 1527 02/05/23 0418 02/05/23 1000 02/05/23 1234  BP: (!) 165/100 (!) 153/94  (!) 152/99  Pulse: (!) 107 100  (!) 106  Resp:  20 (!) 26 20  Temp:  99 F (37.2 C)  99.9 F (37.7 C)  TempSrc:  Oral  Oral  SpO2: 98% 99%  98%  Weight:      Height:        Intake/Output Summary (Last 24 hours) at 02/05/2023 1317 Last data filed at 02/05/2023 1154 Gross per 24 hour  Intake 4107.41 ml  Output --  Net 4107.41 ml    Filed Weights   01/31/23 0725 01/31/23 1500  Weight: 97.5 kg 99.6 kg    Examination:  General  exam: Appears calm at rest.  Mostly comfortable. Respiratory system: Clear to auscultation. Respiratory effort normal.  No added sounds. Cardiovascular system: S1 & S2 heard, RRR. No pedal  edema. Gastrointestinal system: Soft.  Mildly distended.  Bowel sounds present.  Mild tenderness along the epigastrium.  No rigidity or guarding. Central nervous system: Alert and oriented. No focal neurological deficits. Extremities: Symmetric 5 x 5 power. Skin: No rashes, lesions or ulcers Psychiatry: Judgement and insight appear normal. Mood & affect appropriate.     Data Reviewed: I have personally reviewed following labs and imaging studies  CBC: Recent Labs  Lab 02/01/23 0458 02/02/23 0337 02/03/23 0355 02/04/23 0410 02/05/23 0449  WBC 9.8 13.6* 9.8 10.9* 14.6*  NEUTROABS  --  10.9* 8.2* 8.6* 11.5*  HGB 19.4* 16.1 13.0 10.6* 14.1  HCT 55.1* 47.3 39.1 32.5* 42.3  MCV 82.0 83.4 84.8 88.6 85.5  PLT 210 161 141* 109* 166    Basic Metabolic Panel: Recent Labs  Lab 02/01/23 0837 02/02/23 0337 02/03/23 0355 02/04/23 0410 02/05/23 0449  NA 132* 127* 129* 134* 134*  K 3.2* 4.0 3.5 2.7* 4.2  CL 103 98 97* 109 96*  CO2 19* 20* 22 19* 28  GLUCOSE 133* 234* 154* 84 147*  BUN 5* 11 12 7 9   CREATININE 0.89 1.02 0.84 0.53* 0.77  CALCIUM 5.4* 6.1* 6.5* 5.4* 8.3*  MG 1.4* 1.8 2.2 1.5* 2.0  PHOS 2.1* 3.1 1.4* 1.5* 3.0    GFR: Estimated Creatinine Clearance: 136.1 mL/min (by C-G formula based on SCr of 0.77 mg/dL). Liver Function Tests: Recent Labs  Lab 02/01/23 0837 02/02/23 0337 02/03/23 0355 02/04/23 0410 02/05/23 0449  AST 39 33 24 29 90*  ALT 36 29 21 18  53*  ALKPHOS 42 45 46 40 82  BILITOT 1.8* 4.2* 3.7* 3.7* 6.4*  PROT 4.8* 5.6* 5.4* 4.2* 6.7  ALBUMIN 2.5* 2.8* 2.5* 1.9* 2.7*    Recent Labs  Lab 02/01/23 0837 02/02/23 0337 02/03/23 0355 02/04/23 0410 02/05/23 0449  LIPASE 417* 193* 119* 69* 77*    No results for input(s): "AMMONIA" in the last 168 hours. Coagulation Profile: No results for input(s): "INR", "PROTIME" in the last 168 hours. Cardiac Enzymes: No results for input(s): "CKTOTAL", "CKMB", "CKMBINDEX", "TROPONINI" in the last 168  hours. BNP (last 3 results) No results for input(s): "PROBNP" in the last 8760 hours. HbA1C: No results for input(s): "HGBA1C" in the last 72 hours.  CBG: Recent Labs  Lab 02/04/23 2012 02/04/23 2357 02/05/23 0415 02/05/23 0758 02/05/23 1202  GLUCAP 194* 167* 142* 137* 204*    Lipid Profile: Recent Labs    02/04/23 0410 02/05/23 0449  TRIG 166* 199*    Thyroid Function Tests: No results for input(s): "TSH", "T4TOTAL", "FREET4", "T3FREE", "THYROIDAB" in the last 72 hours. Anemia Panel: No results for input(s): "VITAMINB12", "FOLATE", "FERRITIN", "TIBC", "IRON", "RETICCTPCT" in the last 72 hours. Sepsis Labs: No results for input(s): "PROCALCITON", "LATICACIDVEN" in the last 168 hours.  Recent Results (from the past 240 hour(s))  MRSA Next Gen by PCR, Nasal     Status: None   Collection Time: 01/31/23  3:17 PM   Specimen: Nasal Mucosa; Nasal Swab  Result Value Ref Range Status   MRSA by PCR Next Gen NOT DETECTED NOT DETECTED Final    Comment: (NOTE) The GeneXpert MRSA Assay (FDA approved for NASAL specimens only), is one component of a comprehensive MRSA colonization surveillance program. It is not intended to diagnose MRSA infection nor to guide or monitor  treatment for MRSA infections. Test performance is not FDA approved in patients less than 61 years old. Performed at [facility], [address], [place], [place] [address]   Culture, blood (Routine X 2) w Reflex to ID Panel     Status: None   Collection Time: 01/31/23  4:20 PM   Specimen: BLOOD RIGHT ARM  Result Value Ref Range Status   Specimen Description   Final    BLOOD RIGHT ARM Performed at [facility], [address], [place], [place] [address]    Special Requests   Final    BOTTLES DRAWN AEROBIC ONLY Blood Culture adequate volume Performed at [facility], [address] [place], [place] [address]    Culture   Final    NO GROWTH 5 DAYS Performed at  [facility], [address], [place], [place] [address]    Report Status 02/05/2023 FINAL  Final  Culture, blood (Routine X 2) w Reflex to ID Panel     Status: None   Collection Time: 01/31/23  4:20 PM   Specimen: BLOOD RIGHT HAND  Result Value Ref Range Status   Specimen Description   Final    BLOOD RIGHT HAND Performed at [facility], [address] [name], [place] [address]    Special Requests   Final    BOTTLES DRAWN AEROBIC ONLY Blood Culture adequate volume Performed at [facility], [address], [place], [place] [address]    Culture   Final    NO GROWTH 5 DAYS Performed at [facility], [address] [place], [place] [address]    Report Status 02/05/2023 FINAL  Final         Radiology Studies: No results found.      Scheduled Meds:  atorvastatin  40 mg Oral Daily   buPROPion  150 mg Oral Daily   enoxaparin (LOVENOX) injection  40 mg Subcutaneous Q24H   feeding supplement  237 mL Oral TID WC   fenofibrate  160 mg Oral Daily   insulin aspart  0-9 Units Subcutaneous Q4H   insulin detemir  5 Units Subcutaneous BID   multivitamin with minerals  1 tablet Oral Daily   nicotine  14 mg Transdermal Daily   potassium chloride  60 mEq Oral Once   Continuous Infusions:  sodium chloride 50 mL/hr at 02/05/23 1154     LOS: 5 days    Time spent: 35 minutes    Dorcas Carrow, MD [facility] Pager [phone]

## 2023-02-06 ENCOUNTER — Inpatient Hospital Stay (HOSPITAL_COMMUNITY): Payer: 59

## 2023-02-06 DIAGNOSIS — K859 Acute pancreatitis without necrosis or infection, unspecified: Secondary | ICD-10-CM | POA: Diagnosis not present

## 2023-02-06 LAB — GLUCOSE, CAPILLARY
Glucose-Capillary: 138 mg/dL — ABNORMAL HIGH (ref 70–99)
Glucose-Capillary: 151 mg/dL — ABNORMAL HIGH (ref 70–99)
Glucose-Capillary: 166 mg/dL — ABNORMAL HIGH (ref 70–99)
Glucose-Capillary: 179 mg/dL — ABNORMAL HIGH (ref 70–99)
Glucose-Capillary: 204 mg/dL — ABNORMAL HIGH (ref 70–99)
Glucose-Capillary: 96 mg/dL (ref 70–99)

## 2023-02-06 LAB — CBC WITH DIFFERENTIAL/PLATELET
Abs Immature Granulocytes: 0.15 10*3/uL — ABNORMAL HIGH (ref 0.00–0.07)
Basophils Absolute: 0 10*3/uL (ref 0.0–0.1)
Basophils Relative: 0 %
Eosinophils Absolute: 0.1 10*3/uL (ref 0.0–0.5)
Eosinophils Relative: 0 %
HCT: 40 % (ref 39.0–52.0)
Hemoglobin: 13.3 g/dL (ref 13.0–17.0)
Immature Granulocytes: 1 %
Lymphocytes Relative: 9 %
Lymphs Abs: 1.4 10*3/uL (ref 0.7–4.0)
MCH: 28.1 pg (ref 26.0–34.0)
MCHC: 33.3 g/dL (ref 30.0–36.0)
MCV: 84.4 fL (ref 80.0–100.0)
Monocytes Absolute: 1.6 10*3/uL — ABNORMAL HIGH (ref 0.1–1.0)
Monocytes Relative: 11 %
Neutro Abs: 11.7 10*3/uL — ABNORMAL HIGH (ref 1.7–7.7)
Neutrophils Relative %: 79 %
Platelets: 170 10*3/uL (ref 150–400)
RBC: 4.74 MIL/uL (ref 4.22–5.81)
RDW: 13.7 % (ref 11.5–15.5)
WBC: 14.9 10*3/uL — ABNORMAL HIGH (ref 4.0–10.5)
nRBC: 0 % (ref 0.0–0.2)

## 2023-02-06 LAB — TRIGLYCERIDES: Triglycerides: 205 mg/dL — ABNORMAL HIGH (ref <150)

## 2023-02-06 LAB — COMPREHENSIVE METABOLIC PANEL
ALT: 69 U/L — ABNORMAL HIGH (ref 0–44)
AST: 115 U/L — ABNORMAL HIGH (ref 15–41)
Albumin: 2.5 g/dL — ABNORMAL LOW (ref 3.5–5.0)
Alkaline Phosphatase: 84 U/L (ref 38–126)
Anion gap: 12 (ref 5–15)
BUN: 11 mg/dL (ref 6–20)
CO2: 26 mmol/L (ref 22–32)
Calcium: 8.3 mg/dL — ABNORMAL LOW (ref 8.9–10.3)
Chloride: 94 mmol/L — ABNORMAL LOW (ref 98–111)
Creatinine, Ser: 0.87 mg/dL (ref 0.61–1.24)
GFR, Estimated: >60 mL/min (ref >60)
Glucose, Bld: 110 mg/dL — ABNORMAL HIGH (ref 70–99)
Potassium: 3.6 mmol/L (ref 3.5–5.1)
Sodium: 132 mmol/L — ABNORMAL LOW (ref 135–145)
Total Bilirubin: 6.2 mg/dL — ABNORMAL HIGH (ref 0.3–1.2)
Total Protein: 6.3 g/dL — ABNORMAL LOW (ref 6.5–8.1)

## 2023-02-06 LAB — LIPASE, BLOOD: Lipase: 62 U/L — ABNORMAL HIGH (ref 11–51)

## 2023-02-06 LAB — MAGNESIUM: Magnesium: 2 mg/dL (ref 1.7–2.4)

## 2023-02-06 LAB — BILIRUBIN, FRACTIONATED(TOT/DIR/INDIR)
Bilirubin, Direct: 3.5 mg/dL — ABNORMAL HIGH (ref 0.0–0.2)
Indirect Bilirubin: 2.7 mg/dL — ABNORMAL HIGH (ref 0.3–0.9)
Total Bilirubin: 6.2 mg/dL — ABNORMAL HIGH (ref 0.3–1.2)

## 2023-02-06 LAB — PHOSPHORUS: Phosphorus: 3.8 mg/dL (ref 2.5–4.6)

## 2023-02-06 MED ORDER — OXYCODONE HCL 5 MG PO TABS
10.0000 mg | ORAL_TABLET | ORAL | Status: DC | PRN
  Administered 2023-02-07 – 2023-02-09 (×5): 10 mg via ORAL
  Filled 2023-02-06 (×6): qty 2

## 2023-02-06 MED ORDER — BACLOFEN 10 MG PO TABS
10.0000 mg | ORAL_TABLET | Freq: Once | ORAL | Status: AC
  Administered 2023-02-06: 10 mg via ORAL
  Filled 2023-02-06: qty 1

## 2023-02-06 MED ORDER — BACLOFEN 10 MG PO TABS
10.0000 mg | ORAL_TABLET | Freq: Three times a day (TID) | ORAL | Status: DC | PRN
  Administered 2023-02-06 – 2023-02-07 (×2): 10 mg via ORAL
  Filled 2023-02-06 (×2): qty 1

## 2023-02-06 MED ORDER — POLYETHYLENE GLYCOL 3350 17 G PO PACK
17.0000 g | PACK | Freq: Every day | ORAL | Status: DC
  Administered 2023-02-06 – 2023-02-11 (×6): 17 g via ORAL
  Filled 2023-02-06 (×8): qty 1

## 2023-02-06 MED ORDER — OXYCODONE HCL 5 MG PO TABS
5.0000 mg | ORAL_TABLET | ORAL | Status: DC | PRN
  Administered 2023-02-08 (×2): 5 mg via ORAL
  Filled 2023-02-06: qty 1

## 2023-02-06 NOTE — Progress Notes (Signed)
PROGRESS NOTE    George Howe  UEA:[id number] DOB: October 20, 1985 DOA: 01/31/2023 PCP: George Howe, George Patricia, MD  Chief Complaint  Patient presents with   Abdominal Pain    Brief Narrative:   37 year old with history of type 2 diabetes on insulin, hypertriglyceridemia on Lipitor, previous episodes of pancreatitis secondary to hypertriglyceridemia, chronic anxiety and depression presented with 2 days of epigastric pain and back pain. In the emergency room triglycerides more than 5000, lipase 653, serum glucose 313 and anion gap of 16. Clinically diagnosed as pancreatitis, started on IV fluids. CT scan showed moderate peripancreatic fat stranding consistent with acute pancreatitis and suspicious for necrosis at the pancreatic tail. Hepatic steatosis. Admitted with acute pancreatitis.   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:  CT with moderate peripancreatic fat stranding c/w acute pancreatitis and focal hypoattenuation of the pancreatic tail concerning for necrosis  Abdominal pain is minimal today on palpation Currently on soft diet, will advance as tolerated Continue IVF, IV, PO pain meds prn   Hypertriglyceridemia  Dyslipidemia Improved after insulin gtt Will continue to monitor on fenofibrate Hold statin with elevated LFT's Needs lipid clinic at follow up  Hyperbilirubinemia  Elevated Liver Enzymes CT 5/9 with layering hyperdensity in gallbladder neck RUQ [place] 5/15 with diffuse hepatic steatosis, 3 nonmobile echogenic foci (small polyps or stones) MRCP pending GI consulted, appreciate recs Hold statin, may need to hold fibrate   Insulin-dependent type 2 diabetes, hypoglycemia: Blood sugars are acceptable now. long-acting insulin, 5 units twice daily, continue sliding scale insulin.   Chronic anxiety depression: Currently on Wellbutrin.  Will continue.    Smoker: Counseled to quit.  Provide nicotine patch.   Hypocalcemia improved   Hypophosphatemia: follow   Hypomagnesemia: follow   Hypokalemia: follow   Hyponatremia: mild, follow      DVT prophylaxis: lovenox Code Status: full Family Communication: none Disposition:   Status is: Inpatient Remains inpatient appropriate because: need for continued inpatient care   Consultants:  GI  Procedures:  none  Antimicrobials:  Anti-infectives (From admission, onward)    Start     Dose/Rate Route Frequency Ordered Stop   02/01/23 1300  cefTRIAXone (ROCEPHIN) 2 g in sodium chloride 0.9 % 100 mL IVPB  Status:  Discontinued        2 g 200 mL/hr over 30 Minutes Intravenous Every 24 hours 01/31/23 1607 02/01/23 0748   01/31/23 1215  cefTRIAXone (ROCEPHIN) 2 g in sodium chloride 0.9 % 100 mL IVPB        2 g 200 mL/hr over 30 Minutes Intravenous  Once 01/31/23 1203 01/31/23 1313   01/31/23 1215  metroNIDAZOLE (FLAGYL) IVPB 500 mg  Status:  Discontinued        500 mg 100 mL/hr over 60 Minutes Intravenous Every 8 hours 01/31/23 1203 02/01/23 0748       Subjective: Felt better this morning, this afternoon having more discomfort  Objective: Vitals:   02/05/23 1813 02/05/23 2006 02/06/23 0427 02/06/23 1348  BP: (!) 155/98 (!) 158/105 (!) 153/105 (!) 166/97  Pulse:  (!) 106 (!) 101 99  Resp: 20 18 18 20   Temp: 98.7 F (37.1 C) 99.3 F (37.4 C) 98.6 F (37 C) 99 F (37.2 C)  TempSrc: Oral Oral Oral Oral  SpO2: 99% 98% 99% 99%  Weight:      Height:        Intake/Output Summary (Last 24 hours) at  02/06/2023 1605 Last data filed at 02/06/2023 0957 Gross per 24 hour  Intake 1377.87 ml  Output --  Net 1377.87 ml   Filed Weights   01/31/23 0725 01/31/23 1500  Weight: 97.5 kg 99.6 kg    Examination:  General exam: Appears calm and comfortable  Respiratory system: unlabored Cardiovascular system:RRR Gastrointestinal system: Abdomen is nondistended, soft and nontender.   Central nervous system: Alert and oriented. No focal neurological deficits. Extremities: no LEE    Data Reviewed: I have personally reviewed following labs and imaging studies  CBC: Recent Labs  Lab 02/02/23 0337 02/03/23 0355 02/04/23 0410 02/05/23 0449 02/06/23 0420  WBC 13.6* 9.8 10.9* 14.6* 14.9*  NEUTROABS 10.9* 8.2* 8.6* 11.5* 11.7*  HGB 16.1 13.0 10.6* 14.1 13.3  HCT 47.3 39.1 32.5* 42.3 40.0  MCV 83.4 84.8 88.6 85.5 84.4  PLT 161 141* 109* 166 170    Basic Metabolic Panel: Recent Labs  Lab 02/02/23 0337 02/03/23 0355 02/04/23 0410 02/05/23 0449 02/06/23 0420  NA 127* 129* 134* 134* 132*  K 4.0 3.5 2.7* 4.2 3.6  CL 98 97* 109 96* 94*  CO2 20* 22 19* 28 26  GLUCOSE 234* 154* 84 147* 110*  BUN 11 12 7 9 11   CREATININE 1.02 0.84 0.53* 0.77 0.87  CALCIUM 6.1* 6.5* 5.4* 8.3* 8.3*  MG 1.8 2.2 1.5* 2.0 2.0  PHOS 3.1 1.4* 1.5* 3.0 3.8    GFR: Estimated Creatinine Clearance: 125.2 mL/min (by C-G formula based on SCr of 0.87 mg/dL).  Liver Function Tests: Recent Labs  Lab 02/02/23 0337 02/03/23 0355 02/04/23 0410 02/05/23 0449 02/06/23 0420  AST 33 24 29 90* 115*  ALT 29 21 18  53* 69*  ALKPHOS 45 46 40 82 84  BILITOT 4.2* 3.7* 3.7* 6.4* 6.2*  6.2*  PROT 5.6* 5.4* 4.2* 6.7 6.3*  ALBUMIN 2.8* 2.5* 1.9* 2.7* 2.5*    CBG: Recent Labs  Lab 02/05/23 2003 02/05/23 2329 02/06/23 0420 02/06/23 0717 02/06/23 1302  GLUCAP 133* 137* 96 151* 138*     Recent Results (from the past 240 hour(s))  MRSA Next Gen by PCR, Nasal     Status: None   Collection Time: 01/31/23  3:17 PM   Specimen: Nasal Mucosa; Nasal Swab  Result Value Ref Range Status   MRSA by PCR Next Gen NOT DETECTED NOT DETECTED Final    Comment: (NOTE) The GeneXpert MRSA Assay (FDA approved for NASAL specimens only), is one component of [facility] comprehensive MRSA colonization surveillance program. It is not intended to diagnose MRSA infection nor to guide or monitor treatment for MRSA  infections. Test performance is not FDA approved in patients less than 55 years old. Performed at [facility], [address] [place], [place] [address]   Culture, blood (Routine X 2) w Reflex to ID Panel     Status: None   Collection Time: 01/31/23  4:20 PM   Specimen: BLOOD RIGHT ARM  Result Value Ref Range Status   Specimen Description   Final    BLOOD RIGHT ARM Performed at [facility], [address], [place], [place] [address]    Special Requests   Final    BOTTLES DRAWN AEROBIC ONLY Blood Culture adequate volume Performed at [facility], [address] [place], [place] [address]    Culture   Final    NO GROWTH 5 DAYS Performed at [facility], [address], [place], [place] [address]    Report Status  02/05/2023 FINAL  Final  Culture, blood (Routine X 2) w Reflex to ID Panel     Status: None   Collection Time: 01/31/23  4:20 PM   Specimen: BLOOD RIGHT HAND  Result Value Ref Range Status   Specimen Description   Final    BLOOD RIGHT HAND Performed at [facility], [address], [place], [place] [address]    Special Requests   Final    BOTTLES DRAWN AEROBIC ONLY Blood Culture adequate volume Performed at [facility], [address] [place], [place] [address]    Culture   Final    NO GROWTH 5 DAYS Performed at [facility], [address], [place], [place] [address]    Report Status 02/05/2023 FINAL  Final         Radiology Studies: US Abdomen Limited RUQ (LIVER/GB)  Result Date: 02/06/2023 CLINICAL DATA:  [id number] Elevated liver enzymes [id number] EXAM: ULTRASOUND ABDOMEN LIMITED RIGHT UPPER QUADRANT COMPARISON:  CT 01/31/2023 FINDINGS: Gallbladder: The gallbladder is mildly distended. There are 3 non-mobile foci noted along the gallbladder wall, largest measuring 4 mm, could be small polyps or stones. Trace intraluminal sludge. Negative sonographic Murphy sign. No wall  thickening. Common bile duct: Diameter: 2.7 mm, normal.  No intrahepatic ductal dilation Liver: Diffusely increased liver echogenicity. Portal vein is patent on color Doppler imaging with normal direction of blood flow towards the liver. Other: None. IMPRESSION: Diffuse hepatic steatosis. Three non-mobile echogenic foci, could be small polyps or stones, largest measuring 4 mm. No evidence of acute cholecystitis or biliary obstruction. Electronically Signed   By: Caprice Renshaw M.D.   On: 02/06/2023 13:11        Scheduled Meds:  buPROPion  150 mg Oral Daily   enoxaparin (LOVENOX) injection  40 mg Subcutaneous Q24H   feeding supplement  237 mL Oral TID WC   fenofibrate  160 mg Oral Daily   insulin aspart  0-9 Units Subcutaneous Q4H   insulin detemir  5 Units Subcutaneous BID   multivitamin with minerals  1 tablet Oral Daily   nicotine  14 mg Transdermal Daily   potassium chloride  60 mEq Oral Once   Continuous Infusions:  sodium chloride 50 mL/hr at 02/06/23 0957     LOS: 6 days    Time spent: over 30 min    Lacretia Nicks, MD [facility]   To contact the attending provider between 7A-7P or the covering provider during after hours 7P-7A, please log into the web site [email or web address] and access using universal [place] password for that web site. If you do not have the password, please call the hospital operator.  02/06/2023, 4:05 PM

## 2023-02-07 ENCOUNTER — Inpatient Hospital Stay (HOSPITAL_COMMUNITY): Payer: 59

## 2023-02-07 DIAGNOSIS — K859 Acute pancreatitis without necrosis or infection, unspecified: Secondary | ICD-10-CM | POA: Diagnosis not present

## 2023-02-07 LAB — GLUCOSE, CAPILLARY
Glucose-Capillary: 113 mg/dL — ABNORMAL HIGH (ref 70–99)
Glucose-Capillary: 160 mg/dL — ABNORMAL HIGH (ref 70–99)
Glucose-Capillary: 160 mg/dL — ABNORMAL HIGH (ref 70–99)
Glucose-Capillary: 205 mg/dL — ABNORMAL HIGH (ref 70–99)
Glucose-Capillary: 257 mg/dL — ABNORMAL HIGH (ref 70–99)
Glucose-Capillary: 94 mg/dL (ref 70–99)

## 2023-02-07 LAB — BASIC METABOLIC PANEL
Anion gap: 9 (ref 5–15)
BUN: 13 mg/dL (ref 6–20)
CO2: 25 mmol/L (ref 22–32)
Calcium: 8.1 mg/dL — ABNORMAL LOW (ref 8.9–10.3)
Chloride: 96 mmol/L — ABNORMAL LOW (ref 98–111)
Creatinine, Ser: 0.68 mg/dL (ref 0.61–1.24)
GFR, Estimated: >60 mL/min (ref >60)
Glucose, Bld: 136 mg/dL — ABNORMAL HIGH (ref 70–99)
Potassium: 3.5 mmol/L (ref 3.5–5.1)
Sodium: 130 mmol/L — ABNORMAL LOW (ref 135–145)

## 2023-02-07 LAB — HEPATIC FUNCTION PANEL
ALT: 79 U/L — ABNORMAL HIGH (ref 0–44)
AST: 114 U/L — ABNORMAL HIGH (ref 15–41)
Albumin: 2.4 g/dL — ABNORMAL LOW (ref 3.5–5.0)
Alkaline Phosphatase: 119 U/L (ref 38–126)
Bilirubin, Direct: 3.6 mg/dL — ABNORMAL HIGH (ref 0.0–0.2)
Indirect Bilirubin: 3 mg/dL — ABNORMAL HIGH (ref 0.3–0.9)
Total Bilirubin: 6.6 mg/dL — ABNORMAL HIGH (ref 0.3–1.2)
Total Protein: 6.4 g/dL — ABNORMAL LOW (ref 6.5–8.1)

## 2023-02-07 LAB — CBC WITH DIFFERENTIAL/PLATELET
Abs Immature Granulocytes: 0.16 10*3/uL — ABNORMAL HIGH (ref 0.00–0.07)
Basophils Absolute: 0 10*3/uL (ref 0.0–0.1)
Basophils Relative: 0 %
Eosinophils Absolute: 0.1 10*3/uL (ref 0.0–0.5)
Eosinophils Relative: 1 %
HCT: 38.3 % — ABNORMAL LOW (ref 39.0–52.0)
Hemoglobin: 12.6 g/dL — ABNORMAL LOW (ref 13.0–17.0)
Immature Granulocytes: 1 %
Lymphocytes Relative: 9 %
Lymphs Abs: 1.4 10*3/uL (ref 0.7–4.0)
MCH: 27.8 pg (ref 26.0–34.0)
MCHC: 32.9 g/dL (ref 30.0–36.0)
MCV: 84.5 fL (ref 80.0–100.0)
Monocytes Absolute: 1.3 10*3/uL — ABNORMAL HIGH (ref 0.1–1.0)
Monocytes Relative: 9 %
Neutro Abs: 12.4 10*3/uL — ABNORMAL HIGH (ref 1.7–7.7)
Neutrophils Relative %: 80 %
Platelets: 193 10*3/uL (ref 150–400)
RBC: 4.53 MIL/uL (ref 4.22–5.81)
RDW: 13.9 % (ref 11.5–15.5)
WBC: 15.4 10*3/uL — ABNORMAL HIGH (ref 4.0–10.5)
nRBC: 0 % (ref 0.0–0.2)

## 2023-02-07 LAB — HCV INTERPRETATION

## 2023-02-07 LAB — MAGNESIUM: Magnesium: 2.1 mg/dL (ref 1.7–2.4)

## 2023-02-07 LAB — PHOSPHORUS: Phosphorus: 4 mg/dL (ref 2.5–4.6)

## 2023-02-07 LAB — TRIGLYCERIDES: Triglycerides: 210 mg/dL — ABNORMAL HIGH (ref <150)

## 2023-02-07 LAB — LIPASE, BLOOD: Lipase: 71 U/L — ABNORMAL HIGH (ref 11–51)

## 2023-02-07 MED ORDER — GADOBUTROL 1 MMOL/ML IV SOLN
10.0000 mL | Freq: Once | INTRAVENOUS | Status: AC | PRN
  Administered 2023-02-07: 10 mL via INTRAVENOUS

## 2023-02-07 MED ORDER — ACETAMINOPHEN 325 MG PO TABS
650.0000 mg | ORAL_TABLET | Freq: Once | ORAL | Status: DC
  Filled 2023-02-07: qty 2

## 2023-02-07 MED ORDER — HYDROMORPHONE HCL 1 MG/ML IJ SOLN
1.0000 mg | INTRAMUSCULAR | Status: DC | PRN
  Administered 2023-02-07 – 2023-02-14 (×31): 1 mg via INTRAVENOUS
  Filled 2023-02-07 (×31): qty 1

## 2023-02-07 MED ORDER — LACTATED RINGERS IV SOLN: INTRAVENOUS | Status: DC

## 2023-02-07 NOTE — Progress Notes (Signed)
PROGRESS NOTE    George Howe  ZOX:[id number] DOB: 17-Aug-1986 DOA: 01/31/2023 PCP: [name], [name], MD  Chief Complaint  Patient presents with   Abdominal Pain    Brief Narrative:   37 year old with history of type 2 diabetes on insulin, hypertriglyceridemia on Lipitor, previous episodes of pancreatitis secondary to hypertriglyceridemia, chronic anxiety and depression presented with 2 days of epigastric pain and back pain. In the emergency room triglycerides more than 5000, lipase 653, serum glucose 313 and anion gap of 16. Clinically diagnosed as pancreatitis, started on IV fluids. CT scan showed moderate peripancreatic fat stranding consistent with acute pancreatitis and suspicious for necrosis at the pancreatic tail. Hepatic steatosis. Admitted with acute pancreatitis.   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:  CT with moderate peripancreatic fat stranding c/w acute pancreatitis and focal hypoattenuation of the pancreatic tail concerning for necrosis  MRCP with extensive pancreatic inflammation and acute peripancreatic fluid collections, nonenhancing collections with proteinaceous and blood products in the pancreatic tail and separately in the pancreatic head concerning for local pancreatic necrosis --- splenic vein not thought to be completely occluded, but attenuated, small gallstones, small bilateral effusions (see report) Currently on clears, ADAT Continue IVF, IV, PO pain meds prn  Appreciate GI recs  Hyperbilirubinemia  Elevated Liver Enzymes CT 5/9 with layering hyperdensity in gallbladder neck RUQ [place] 5/15 with diffuse hepatic steatosis, 3 nonmobile echogenic foci (small polyps or stones) MRCP with tapering of distal CBD in vicinity of pancreatic head related to extrinsic narrowing due to pancreatitis GI consulted, appreciate recs -> consider ERCP,  hopeful to avoid this, CLD, follow up imaging Hold statin, may need to hold fibrate  Hypertriglyceridemia  Dyslipidemia Improved after insulin gtt Will continue to monitor on fenofibrate Hold statin with elevated LFT's Needs lipid clinic at follow up   Attenuation of Splenic Vein Discussed with radiology, low suspicion for thrombus, suspects this is due to inflammation/narrowing  Insulin-dependent type 2 diabetes, hypoglycemia: Blood sugars are acceptable now. long-acting insulin, 5 units twice daily, continue sliding scale insulin.   Chronic anxiety depression: Currently on Wellbutrin.  Will continue.   Smoker: Counseled to quit.  Provide nicotine patch.   Hypocalcemia improved   Hypophosphatemia: follow   Hypomagnesemia: follow   Hypokalemia: follow   Hyponatremia: mild, follow      DVT prophylaxis: lovenox Code Status: full Family Communication: none Disposition:   Status is: Inpatient Remains inpatient appropriate because: need for continued inpatient care   Consultants:  GI  Procedures:  none  Antimicrobials:  Anti-infectives (From admission, onward)    Start     Dose/Rate Route Frequency Ordered Stop   02/01/23 1300  cefTRIAXone (ROCEPHIN) 2 g in sodium chloride 0.9 % 100 mL IVPB  Status:  Discontinued        2 g 200 mL/hr over 30 Minutes Intravenous Every 24 hours 01/31/23 1607 02/01/23 0748   01/31/23 1215  cefTRIAXone (ROCEPHIN) 2 g in sodium chloride 0.9 % 100 mL IVPB        2 g 200 mL/hr over 30 Minutes Intravenous  Once 01/31/23 1203 01/31/23 1313   01/31/23 1215  metroNIDAZOLE (FLAGYL) IVPB 500 mg  Status:  Discontinued        500 mg 100 mL/hr over 60 Minutes Intravenous Every 8 hours 01/31/23 1203 02/01/23 0748       Subjective: Complaining of pain this morning  Objective: Vitals:  02/06/23 1348 02/06/23 2011 02/07/23 0412 02/07/23 1300  BP: (!) 166/97 (!) 161/104 (!) 143/90 136/82  Pulse: 99 (!) 109 98 91  Resp: 20 20 18 18    Temp: 99 F (37.2 C) 100.2 F (37.9 C) 98.9 F (37.2 C) 99 F (37.2 C)  TempSrc: Oral Oral Oral Oral  SpO2: 99% 96% 94% 98%  Weight:      Height:        Intake/Output Summary (Last 24 hours) at 02/07/2023 1532 Last data filed at 02/07/2023 1401 Gross per 24 hour  Intake 1528.89 ml  Output --  Net 1528.89 ml   Filed Weights   01/31/23 0725 01/31/23 1500  Weight: 97.5 kg 99.6 kg    Examination:  General: No acute distress. Cardiovascular:RRR Lungs: unlabored Abdomen: distended, mild epigastric pain Neurological: Alert and oriented 3. Moves all extremities 4 with equal strength. Cranial nerves II through XII grossly intact. Extremities: No clubbing or cyanosis. No edema.  Data Reviewed: I have personally reviewed following labs and imaging studies  CBC: Recent Labs  Lab 02/03/23 0355 02/04/23 0410 02/05/23 0449 02/06/23 0420 02/07/23 0417  WBC 9.8 10.9* 14.6* 14.9* 15.4*  NEUTROABS 8.2* 8.6* 11.5* 11.7* 12.4*  HGB 13.0 10.6* 14.1 13.3 12.6*  HCT 39.1 32.5* 42.3 40.0 38.3*  MCV 84.8 88.6 85.5 84.4 84.5  PLT 141* 109* 166 170 193    Basic Metabolic Panel: Recent Labs  Lab 02/03/23 0355 02/04/23 0410 02/05/23 0449 02/06/23 0420 02/07/23 0417  NA 129* 134* 134* 132* 130*  K 3.5 2.7* 4.2 3.6 3.5  CL 97* 109 96* 94* 96*  CO2 22 19* 28 26 25   GLUCOSE 154* 84 147* 110* 136*  BUN 12 7 9 11 13   CREATININE 0.84 0.53* 0.77 0.87 0.68  CALCIUM 6.5* 5.4* 8.3* 8.3* 8.1*  MG 2.2 1.5* 2.0 2.0 2.1  PHOS 1.4* 1.5* 3.0 3.8 4.0    GFR: Estimated Creatinine Clearance: 136.1 mL/min (by C-G formula based on SCr of 0.68 mg/dL).  Liver Function Tests: Recent Labs  Lab 02/03/23 0355 02/04/23 0410 02/05/23 0449 02/06/23 0420 02/07/23 0417  AST 24 29 90* 115* 114*  ALT 21 18 53* 69* 79*  ALKPHOS 46 40 82 84 119  BILITOT 3.7* 3.7* 6.4* 6.2*  6.2* 6.6*  PROT 5.4* 4.2* 6.7 6.3* 6.4*  ALBUMIN 2.5* 1.9* 2.7* 2.5* 2.4*    CBG: Recent Labs  Lab 02/06/23 2006  02/06/23 2358 02/07/23 0410 02/07/23 0745 02/07/23 1138  GLUCAP 204* 179* 160* 94 205*     Recent Results (from the past 240 hour(s))  MRSA Next Gen by PCR, Nasal     Status: None   Collection Time: 01/31/23  3:17 PM   Specimen: Nasal Mucosa; Nasal Swab  Result Value Ref Range Status   MRSA by PCR Next Gen NOT DETECTED NOT DETECTED Final    Comment: (NOTE) The GeneXpert MRSA Assay (FDA approved for NASAL specimens only), is one component of Zahlia Deshazer comprehensive MRSA colonization surveillance program. It is not intended to diagnose MRSA infection nor to guide or monitor treatment for MRSA infections. Test performance is not FDA approved in patients less than 53 years old. Performed at [facility], [address], [place], [place] [address]   Culture, blood (Routine X 2) w Reflex to ID Panel     Status: None   Collection Time: 01/31/23  4:20 PM   Specimen: BLOOD RIGHT ARM  Result Value Ref Range Status   Specimen Description   Final  BLOOD RIGHT ARM Performed at [facility], [address], [place], [place] [address]    Special Requests   Final    BOTTLES DRAWN AEROBIC ONLY Blood Culture adequate volume Performed at [facility], [address] [place], [place] [address]    Culture   Final    NO GROWTH 5 DAYS Performed at [facility], [address], [place], [place] [address]    Report Status 02/05/2023 FINAL  Final  Culture, blood (Routine X 2) w Reflex to ID Panel     Status: None   Collection Time: 01/31/23  4:20 PM   Specimen: BLOOD RIGHT HAND  Result Value Ref Range Status   Specimen Description   Final    BLOOD RIGHT HAND Performed at [facility], [address], [place], [place] [address]    Special Requests   Final    BOTTLES DRAWN AEROBIC ONLY Blood Culture adequate volume Performed at [facility], [address], [place], [place] [address]    Culture   Final    NO GROWTH 5  DAYS Performed at [facility], [address], [place], [place] [address]    Report Status 02/05/2023 FINAL  Final         Radiology Studies: MR ABDOMEN MRCP W WO CONTAST  Result Date: 02/07/2023 CLINICAL DATA:  Pancreatitis; gallbladder polyps versus stones on recent ultrasound. EXAM: MRI ABDOMEN WITHOUT AND WITH CONTRAST (INCLUDING MRCP) TECHNIQUE: Multiplanar multisequence MR imaging of the abdomen was performed both before and after the administration of intravenous contrast. Heavily T2-weighted images of the biliary and pancreatic ducts were obtained, and three-dimensional MRCP images were rendered by post processing. CONTRAST:  10mL GADAVIST GADOBUTROL 1 MMOL/ML IV SOLN COMPARISON:  CT scan 01/31/2023 and ultrasound 02/06/2023 FINDINGS: Despite efforts by the technologist and patient, motion artifact is present on today's exam and could not be eliminated. This reduces exam sensitivity and specificity. Lower chest: Up small bilateral pleural effusions with subsegmental atelectasis in the left lower lobe. Hepatobiliary: Mild steatosis in segments 5 and 8 of the liver. Faint dependent low signal in the gallbladder for example on image 23 series 3, suspicious for small gallstones. Motion artifact substantially obscures the MRCP images. There is tapering of the distal CBD in the vicinity of the pancreatic head probably related to extrinsic narrowing due to pancreatitis. I do not see Teretha Chalupa well-defined stone along the projected course of the common bile duct. Pancreas: Extensive pancreatic inflammation with extensive peripancreatic edema and peripancreatic acute fluid collections. The previously hypoenhancing lesion along the pancreatic tail currently demonstrates internal high precontrast T1 signal, lack of enhancement, and marginal low T2 signal suspicious for Tiffani Kadow collection of blood products and possible localized pancreatic necrosis. This measures 3.3 by 2.0 cm on image 22 series 3 There is Tityana Pagan new  region of suspected necrosis in the pancreatic head with lack of enhancement in Bush Murdoch 3.7 by 2.1 cm pancreatic head lesion which demonstrates some internal high T1 signal compatible with blood products. Pancreatic abscess is Cabrina Shiroma differential diagnostic consideration. Spleen:  Unremarkable Adrenals/Urinary Tract:  Unremarkable Stomach/Bowel: Unremarkable Vascular/Lymphatic: The splenic vein is not thought to be completely occluded although does seem to be attenuated, with some collateralization. Other:  Edema tracks along the paracolic gutters. Musculoskeletal: Unremarkable IMPRESSION: 1. Extensive pancreatic inflammation and acute peripancreatic fluid collections. Nonenhancing collections with proteinaceous and blood products in the pancreatic tail and separately in the pancreatic head, concerning for local pancreatic  necrosis. No internal gas density to indicate gas forming abscesses at this time. 2. The splenic vein is not thought to be completely occluded although does seem to be attenuated, with some collateralization. 3. The MRCP images are substantially degraded by motion artifact, but there is tapering of the distal CBD in the vicinity of the pancreatic head probably related to extrinsic narrowing due to pancreatitis. No well-defined stone along the projected course of the common bile duct. 4. Suspected small gallstones dependently in the gallbladder, although poorly characterized. 5. Small bilateral pleural effusions with subsegmental atelectasis in the left lower lobe. 6. Mild steatosis in segments 5 and 8 of the liver. Electronically Signed   By: Gaylyn Rong M.D.   On: 02/07/2023 09:15   MR 3D Recon At Scanner  Result Date: 02/07/2023 CLINICAL DATA:  Pancreatitis; gallbladder polyps versus stones on recent ultrasound. EXAM: MRI ABDOMEN WITHOUT AND WITH CONTRAST (INCLUDING MRCP) TECHNIQUE: Multiplanar multisequence MR imaging of the abdomen was performed both before and after the administration of  intravenous contrast. Heavily T2-weighted images of the biliary and pancreatic ducts were obtained, and three-dimensional MRCP images were rendered by post processing. CONTRAST:  10mL GADAVIST GADOBUTROL 1 MMOL/ML IV SOLN COMPARISON:  CT scan 01/31/2023 and ultrasound 02/06/2023 FINDINGS: Despite efforts by the technologist and patient, motion artifact is present on today's exam and could not be eliminated. This reduces exam sensitivity and specificity. Lower chest: Up small bilateral pleural effusions with subsegmental atelectasis in the left lower lobe. Hepatobiliary: Mild steatosis in segments 5 and 8 of the liver. Faint dependent low signal in the gallbladder for example on image 23 series 3, suspicious for small gallstones. Motion artifact substantially obscures the MRCP images. There is tapering of the distal CBD in the vicinity of the pancreatic head probably related to extrinsic narrowing due to pancreatitis. I do not see Tu Bayle well-defined stone along the projected course of the common bile duct. Pancreas: Extensive pancreatic inflammation with extensive peripancreatic edema and peripancreatic acute fluid collections. The previously hypoenhancing lesion along the pancreatic tail currently demonstrates internal high precontrast T1 signal, lack of enhancement, and marginal low T2 signal suspicious for Laria Grimmett collection of blood products and possible localized pancreatic necrosis. This measures 3.3 by 2.0 cm on image 22 series 3 There is Rayanna Matusik new region of suspected necrosis in the pancreatic head with lack of enhancement in Acacia Latorre 3.7 by 2.1 cm pancreatic head lesion which demonstrates some internal high T1 signal compatible with blood products. Pancreatic abscess is Pelham Hennick differential diagnostic consideration. Spleen:  Unremarkable Adrenals/Urinary Tract:  Unremarkable Stomach/Bowel: Unremarkable Vascular/Lymphatic: The splenic vein is not thought to be completely occluded although does seem to be attenuated, with some  collateralization. Other:  Edema tracks along the paracolic gutters. Musculoskeletal: Unremarkable IMPRESSION: 1. Extensive pancreatic inflammation and acute peripancreatic fluid collections. Nonenhancing collections with proteinaceous and blood products in the pancreatic tail and separately in the pancreatic head, concerning for local pancreatic necrosis. No internal gas density to indicate gas forming abscesses at this time. 2. The splenic vein is not thought to be completely occluded although does seem to be attenuated, with some collateralization. 3. The MRCP images are substantially degraded by motion artifact, but there is tapering of the distal CBD in the vicinity of the pancreatic head probably related to extrinsic narrowing due to pancreatitis. No well-defined stone along the projected course of the common bile duct. 4. Suspected small gallstones dependently in the gallbladder, although poorly characterized. 5. Small bilateral pleural effusions with subsegmental atelectasis  in the left lower lobe. 6. Mild steatosis in segments 5 and 8 of the liver. Electronically Signed   By: Gaylyn Rong M.D.   On: 02/07/2023 09:15   US Abdomen Limited RUQ (LIVER/GB)  Result Date: 02/06/2023 CLINICAL DATA:  [id number] Elevated liver enzymes [id number] EXAM: ULTRASOUND ABDOMEN LIMITED RIGHT UPPER QUADRANT COMPARISON:  CT 01/31/2023 FINDINGS: Gallbladder: The gallbladder is mildly distended. There are 3 non-mobile foci noted along the gallbladder wall, largest measuring 4 mm, could be small polyps or stones. Trace intraluminal sludge. Negative sonographic Murphy sign. No wall thickening. Common bile duct: Diameter: 2.7 mm, normal.  No intrahepatic ductal dilation Liver: Diffusely increased liver echogenicity. Portal vein is patent on color Doppler imaging with normal direction of blood flow towards the liver. Other: None. IMPRESSION: Diffuse hepatic steatosis. Three non-mobile echogenic foci, could be small polyps or  stones, largest measuring 4 mm. No evidence of acute cholecystitis or biliary obstruction. Electronically Signed   By: Caprice Renshaw M.D.   On: 02/06/2023 13:11        Scheduled Meds:  buPROPion  150 mg Oral Daily   enoxaparin (LOVENOX) injection  40 mg Subcutaneous Q24H   feeding supplement  237 mL Oral TID WC   fenofibrate  160 mg Oral Daily   insulin aspart  0-9 Units Subcutaneous Q4H   insulin detemir  5 Units Subcutaneous BID   multivitamin with minerals  1 tablet Oral Daily   nicotine  14 mg Transdermal Daily   polyethylene glycol  17 g Oral Daily   potassium chloride  60 mEq Oral Once   Continuous Infusions:  lactated ringers       LOS: 7 days    Time spent: over 30 min    Lacretia Nicks, MD [facility]   To contact the attending provider between 7A-7P or the covering provider during after hours 7P-7A, please log into the web site [email or web address] and access using universal [place] password for that web site. If you do not have the password, please call the hospital operator.  02/07/2023, 3:32 PM

## 2023-02-07 NOTE — Plan of Care (Signed)
  Problem: Education: Goal: Knowledge of General Education information will improve Description: Including pain rating scale, medication(s)/side effects and non-pharmacologic comfort measures Outcome: Progressing   Problem: Clinical Measurements: Goal: Respiratory complications will improve Outcome: Progressing   Problem: Activity: Goal: Risk for activity intolerance will decrease Outcome: Progressing   Problem: Coping: Goal: Level of anxiety will decrease Outcome: Progressing   Problem: Elimination: Goal: Will not experience complications related to urinary retention Outcome: Progressing   Problem: Safety: Goal: Ability to remain free from injury will improve Outcome: Progressing   Problem: Pain Managment: Goal: General experience of comfort will improve Outcome: Not Progressing   Problem: Fluid Volume: Goal: Ability to maintain a balanced intake and output will improve Outcome: Not Progressing

## 2023-02-07 NOTE — Progress Notes (Signed)
Pt continues with non-stop hiccups that are exacerbating abdominal pain. Provider notified. PRN baclofen ordered and administered, which was effective.

## 2023-02-07 NOTE — Consult Note (Signed)
[facility] Consultation Note  Referring Provider: [facility] Primary Care Physician:  [name], [name], MD  Reason for Consultation:  pancreatitis  HPI: George Howe is a 37 y.o. male admitted abdominal pain.  Prior admissions for pancreatitis.  Triglycerides over 5000 upon admission.  He had worsening pain with attempts to advance diet.  Had not had triglycerides treated after prior pancreatitis attacks.     Past Medical History:  Diagnosis Date   Chicken pox    Depression    Diabetes mellitus without complication (HCC)    DM (diabetes mellitus) (HCC)    Hay fever    Hyperlipidemia    Hyperlipidemia    Morbid obesity (HCC)    PONV (postoperative nausea and vomiting)     Past Surgical History:  Procedure Laterality Date   CARPAL TUNNEL RELEASE Right 10/18/2021   Procedure: Right CARPAL TUNNEL RELEASE;  Surgeon: Marlyne Beards, MD;  Location: [facility];  Service: Orthopedics;  Laterality: Right;   CARPAL TUNNEL RELEASE Left 11/01/2021   Procedure: Left CARPAL TUNNEL RELEASE;  Surgeon: Marlyne Beards, MD;  Location: [facility] OR;  Service: Orthopedics;  Laterality: Left;    Prior to Admission medications   Medication Sig Start Date End Date Taking? Authorizing Provider  atorvastatin (LIPITOR) 40 MG tablet Take 1 tablet (40 mg total) by mouth daily. 12/31/22  Yes [name], [name], MD  buPROPion (WELLBUTRIN XL) 150 MG 24 hr tablet Take 1 tablet (150 mg total) by mouth daily. 10/01/22  Yes [name], [name], MD  Continuous Glucose Sensor (FREESTYLE LIBRE 3 SENSOR) MISC Place 1 sensor on the skin every 14 days. Use to check glucose continuously 10/01/22  Yes [name], [name], MD  insulin aspart (NOVOLOG FLEXPEN) 100 UNIT/ML FlexPen Inject 4 units with each meal. Additionally, add 3 units for each 40 points above 140. 10/10/22  Yes [name], [name], MD  Insulin Glargine [facility]) 100 UNIT/ML Inject 33  Units into the skin daily. 10/29/22  Yes [name], [name], MD  Multiple Vitamins-Minerals (MULTIVITAMIN WITH MINERALS) tablet Take 1 tablet by mouth daily.   Yes [provider]  cetirizine (ZYRTEC) 10 MG tablet Take 1 tablet (10 mg total) by mouth daily as needed for allergies. 12/19/21   [name], [name], MD  Insulin Pen Needle (PEN NEEDLES) 32G X 4 MM MISC Use 1 pen needle to inject insulin 4 times per day. 12/03/22   [name], [name], MD  sertraline (ZOLOFT) 100 MG tablet Take 1 tablet (100 mg total) by mouth daily. 08/15/22   [name], [name], MD    Current Facility-Administered Medications  Medication Dose Route Frequency Provider Last Rate Last Admin   0.9 %  sodium chloride infusion   Intravenous Continuous Dorcas Carrow, MD   Paused at 02/07/23 0545   baclofen (LIORESAL) tablet 10 mg  10 mg Oral Q8H PRN Anthoney Harada, NP   10 mg at 02/07/23 0415   buPROPion (WELLBUTRIN XL) 24 hr tablet 150 mg  150 mg Oral Daily Dow Adolph N, DO   150 mg at 02/07/23 0810   enoxaparin (LOVENOX) injection 40 mg  40 mg Subcutaneous Q24H Dorcas Carrow, MD   40 mg at 02/07/23 0810   feeding supplement (ENSURE ENLIVE / ENSURE PLUS) liquid 237 mL  237 mL Oral TID WC Adam Phenix, PA-C   237 mL at 02/07/23 1610   fenofibrate tablet 160 mg  160 mg Oral Daily Dorcas Carrow,  MD   160 mg at 02/07/23 0810   HYDROmorphone (DILAUDID) injection 1 mg  1 mg Intravenous Q3H PRN Dorcas Carrow, MD   1 mg at 02/07/23 1154   insulin aspart (novoLOG) injection 0-9 Units  0-9 Units Subcutaneous Q4H Dorcas Carrow, MD   3 Units at 02/07/23 1153   insulin detemir (LEVEMIR) injection 5 Units  5 Units Subcutaneous BID Dorcas Carrow, MD   5 Units at 02/07/23 0810   labetalol (NORMODYNE) injection 10 mg  10 mg Intravenous Q2H PRN Dorcas Carrow, MD       multivitamin with minerals tablet 1 tablet  1 tablet Oral Daily Dow Adolph N, DO   1 tablet at 02/07/23 1610   nicotine  (NICODERM CQ - dosed in mg/24 hours) patch 14 mg  14 mg Transdermal Daily Dorcas Carrow, MD   14 mg at 02/07/23 0810   Oral care mouth rinse  15 mL Mouth Rinse PRN Dow Adolph N, DO       oxyCODONE (Oxy IR/ROXICODONE) immediate release tablet 5 mg  5 mg Oral Q4H PRN Zigmund Daniel., MD       Or   oxyCODONE (Oxy IR/ROXICODONE) immediate release tablet 10 mg  10 mg Oral Q4H PRN Zigmund Daniel., MD       polyethylene glycol (MIRALAX / GLYCOLAX) packet 17 g  17 g Oral Daily PRN Darlin Drop, DO   17 g at 02/05/23 9604   polyethylene glycol (MIRALAX / GLYCOLAX) packet 17 g  17 g Oral Daily Zigmund Daniel., MD   17 g at 02/07/23 0810   potassium chloride (KLOR-CON) packet 60 mEq  60 mEq Oral Once Luiz Iron, NP       prochlorperazine (COMPAZINE) injection 5 mg  5 mg Intravenous Q6H PRN Dow Adolph N, DO   5 mg at 02/04/23 1038    Allergies as of 01/31/2023   (No Known Allergies)    Family History  Problem Relation Age of Onset   Diabetes Father    Diabetes Paternal Grandmother     Social History   Socioeconomic History   Marital status: Single    Spouse name: Not on file   Number of children: Not on file   Years of education: Not on file   Highest education level: Not on file  Occupational History   Not on file  Tobacco Use   Smoking status: Every Day    Packs/day: 1.00    Years: 17.00    Additional pack years: 0.00    Total pack years: 17.00    Types: Cigarettes   Smokeless tobacco: Never  Vaping Use   Vaping Use: Never used  Substance and Sexual Activity   Alcohol use: Yes    Comment: occasional   Drug use: Yes    Types: Marijuana    Comment: last 10-17-21   Sexual activity: Yes  Other Topics Concern   Not on file  Social History Narrative   Not on file   Social Determinants of Health   Financial Resource Strain: Not on file  Food Insecurity: No Food Insecurity (01/31/2023)   Hunger Vital Sign    Worried About Running Out of Food in  the Last Year: Never true    Ran Out of Food in the Last Year: Never true  Transportation Needs: No Transportation Needs (01/31/2023)   PRAPARE - Transportation    Lack of Transportation (Medical): No    Lack of Transportation (Non-Medical): No  Physical Activity: Not on file  Stress: Not on file  Social Connections: Not on file  Intimate Partner Violence: Not At Risk (01/31/2023)   Humiliation, Afraid, Rape, and Kick questionnaire    Fear of Current or Ex-Partner: No    Emotionally Abused: No    Physically Abused: No    Sexually Abused: No    Review of Systems: As per HPI all others negative  Physical Exam: Vital signs in last 24 hours: Temp:  [98.9 F (37.2 C)-100.2 F (37.9 C)] 98.9 F (37.2 C) (05/16 0412) Pulse Rate:  [98-109] 98 (05/16 0412) Resp:  [18-20] 18 (05/16 0412) BP: (143-166)/(90-104) 143/90 (05/16 0412) SpO2:  [94 %-99 %] 94 % (05/16 0412) Last BM Date : 02/06/23 General:   Alert,  overweight, pleasant and cooperative in NAD Head:  Normocephalic and atraumatic. Eyes:  Sclera clear, no icterus.   Conjunctiva pink. Ears:  Normal auditory acuity. Nose:  No deformity, discharge,  or lesions. Mouth:  No deformity or lesions.  Oropharynx pink & moist. Neck:  Supple; no masses or thyromegaly. Lungs:  No respiratory distress Abdomen:  Soft, mild distended, mild generalized tenderness without peritonitis. No masses, hepatosplenomegaly or hernias noted. No guarding, and without rebound.     Msk:  Symmetrical without gross deformities. Normal posture. Pulses:  Normal pulses noted. Extremities:  Without clubbing or edema. Neurologic:  Alert and  oriented x4;  grossly normal neurologically. Skin:  Intact without significant lesions or rashes. Psych:  Alert and cooperative. Normal mood and affect.   Lab Results: Recent Labs    02/05/23 0449 02/06/23 0420 02/07/23 0417  WBC 14.6* 14.9* 15.4*  HGB 14.1 13.3 12.6*  HCT 42.3 40.0 38.3*  PLT 166 170 193    BMET Recent Labs    02/05/23 0449 02/06/23 0420 02/07/23 0417  NA 134* 132* 130*  K 4.2 3.6 3.5  CL 96* 94* 96*  CO2 28 26 25   GLUCOSE 147* 110* 136*  BUN 9 11 13   CREATININE 0.77 0.87 0.68  CALCIUM 8.3* 8.3* 8.1*   LFT Recent Labs    02/07/23 0417  PROT 6.4*  ALBUMIN 2.4*  AST 114*  ALT 79*  ALKPHOS 119  BILITOT 6.6*  BILIDIR 3.6*  IBILI 3.0*   PT/INR No results for input(s): "LABPROT", "INR" in the last 72 hours.  Studies/Results: MR ABDOMEN MRCP W WO CONTAST  Result Date: 02/07/2023 CLINICAL DATA:  Pancreatitis; gallbladder polyps versus stones on recent ultrasound. EXAM: MRI ABDOMEN WITHOUT AND WITH CONTRAST (INCLUDING MRCP) TECHNIQUE: Multiplanar multisequence MR imaging of the abdomen was performed both before and after the administration of intravenous contrast. Heavily T2-weighted images of the biliary and pancreatic ducts were obtained, and three-dimensional MRCP images were rendered by post processing. CONTRAST:  10mL GADAVIST GADOBUTROL 1 MMOL/ML IV SOLN COMPARISON:  CT scan 01/31/2023 and ultrasound 02/06/2023 FINDINGS: Despite efforts by the technologist and patient, motion artifact is present on today's exam and could not be eliminated. This reduces exam sensitivity and specificity. Lower chest: Up small bilateral pleural effusions with subsegmental atelectasis in the left lower lobe. Hepatobiliary: Mild steatosis in segments 5 and 8 of the liver. Faint dependent low signal in the gallbladder for example on image 23 series 3, suspicious for small gallstones. Motion artifact substantially obscures the MRCP images. There is tapering of the distal CBD in the vicinity of the pancreatic head probably related to extrinsic narrowing due to pancreatitis. I do not see a well-defined stone along the projected course of the common  bile duct. Pancreas: Extensive pancreatic inflammation with extensive peripancreatic edema and peripancreatic acute fluid collections. The  previously hypoenhancing lesion along the pancreatic tail currently demonstrates internal high precontrast T1 signal, lack of enhancement, and marginal low T2 signal suspicious for a collection of blood products and possible localized pancreatic necrosis. This measures 3.3 by 2.0 cm on image 22 series 3 There is a new region of suspected necrosis in the pancreatic head with lack of enhancement in a 3.7 by 2.1 cm pancreatic head lesion which demonstrates some internal high T1 signal compatible with blood products. Pancreatic abscess is a differential diagnostic consideration. Spleen:  Unremarkable Adrenals/Urinary Tract:  Unremarkable Stomach/Bowel: Unremarkable Vascular/Lymphatic: The splenic vein is not thought to be completely occluded although does seem to be attenuated, with some collateralization. Other:  Edema tracks along the paracolic gutters. Musculoskeletal: Unremarkable IMPRESSION: 1. Extensive pancreatic inflammation and acute peripancreatic fluid collections. Nonenhancing collections with proteinaceous and blood products in the pancreatic tail and separately in the pancreatic head, concerning for local pancreatic necrosis. No internal gas density to indicate gas forming abscesses at this time. 2. The splenic vein is not thought to be completely occluded although does seem to be attenuated, with some collateralization. 3. The MRCP images are substantially degraded by motion artifact, but there is tapering of the distal CBD in the vicinity of the pancreatic head probably related to extrinsic narrowing due to pancreatitis. No well-defined stone along the projected course of the common bile duct. 4. Suspected small gallstones dependently in the gallbladder, although poorly characterized. 5. Small bilateral pleural effusions with subsegmental atelectasis in the left lower lobe. 6. Mild steatosis in segments 5 and 8 of the liver. Electronically Signed   By: Gaylyn Rong M.D.   On: 02/07/2023 09:15    MR 3D Recon At Scanner  Result Date: 02/07/2023 CLINICAL DATA:  Pancreatitis; gallbladder polyps versus stones on recent ultrasound. EXAM: MRI ABDOMEN WITHOUT AND WITH CONTRAST (INCLUDING MRCP) TECHNIQUE: Multiplanar multisequence MR imaging of the abdomen was performed both before and after the administration of intravenous contrast. Heavily T2-weighted images of the biliary and pancreatic ducts were obtained, and three-dimensional MRCP images were rendered by post processing. CONTRAST:  10mL GADAVIST GADOBUTROL 1 MMOL/ML IV SOLN COMPARISON:  CT scan 01/31/2023 and ultrasound 02/06/2023 FINDINGS: Despite efforts by the technologist and patient, motion artifact is present on today's exam and could not be eliminated. This reduces exam sensitivity and specificity. Lower chest: Up small bilateral pleural effusions with subsegmental atelectasis in the left lower lobe. Hepatobiliary: Mild steatosis in segments 5 and 8 of the liver. Faint dependent low signal in the gallbladder for example on image 23 series 3, suspicious for small gallstones. Motion artifact substantially obscures the MRCP images. There is tapering of the distal CBD in the vicinity of the pancreatic head probably related to extrinsic narrowing due to pancreatitis. I do not see a well-defined stone along the projected course of the common bile duct. Pancreas: Extensive pancreatic inflammation with extensive peripancreatic edema and peripancreatic acute fluid collections. The previously hypoenhancing lesion along the pancreatic tail currently demonstrates internal high precontrast T1 signal, lack of enhancement, and marginal low T2 signal suspicious for a collection of blood products and possible localized pancreatic necrosis. This measures 3.3 by 2.0 cm on image 22 series 3 There is a new region of suspected necrosis in the pancreatic head with lack of enhancement in a 3.7 by 2.1 cm pancreatic head lesion which demonstrates some internal high T1  signal compatible with blood  products. Pancreatic abscess is a differential diagnostic consideration. Spleen:  Unremarkable Adrenals/Urinary Tract:  Unremarkable Stomach/Bowel: Unremarkable Vascular/Lymphatic: The splenic vein is not thought to be completely occluded although does seem to be attenuated, with some collateralization. Other:  Edema tracks along the paracolic gutters. Musculoskeletal: Unremarkable IMPRESSION: 1. Extensive pancreatic inflammation and acute peripancreatic fluid collections. Nonenhancing collections with proteinaceous and blood products in the pancreatic tail and separately in the pancreatic head, concerning for local pancreatic necrosis. No internal gas density to indicate gas forming abscesses at this time. 2. The splenic vein is not thought to be completely occluded although does seem to be attenuated, with some collateralization. 3. The MRCP images are substantially degraded by motion artifact, but there is tapering of the distal CBD in the vicinity of the pancreatic head probably related to extrinsic narrowing due to pancreatitis. No well-defined stone along the projected course of the common bile duct. 4. Suspected small gallstones dependently in the gallbladder, although poorly characterized. 5. Small bilateral pleural effusions with subsegmental atelectasis in the left lower lobe. 6. Mild steatosis in segments 5 and 8 of the liver. Electronically Signed   By: Gaylyn Rong M.D.   On: 02/07/2023 09:15   US Abdomen Limited RUQ (LIVER/GB)  Result Date: 02/06/2023 CLINICAL DATA:  [id number] Elevated liver enzymes [id number] EXAM: ULTRASOUND ABDOMEN LIMITED RIGHT UPPER QUADRANT COMPARISON:  CT 01/31/2023 FINDINGS: Gallbladder: The gallbladder is mildly distended. There are 3 non-mobile foci noted along the gallbladder wall, largest measuring 4 mm, could be small polyps or stones. Trace intraluminal sludge. Negative sonographic Murphy sign. No wall thickening. Common bile duct:  Diameter: 2.7 mm, normal.  No intrahepatic ductal dilation Liver: Diffusely increased liver echogenicity. Portal vein is patent on color Doppler imaging with normal direction of blood flow towards the liver. Other: None. IMPRESSION: Diffuse hepatic steatosis. Three non-mobile echogenic foci, could be small polyps or stones, largest measuring 4 mm. No evidence of acute cholecystitis or biliary obstruction. Electronically Signed   By: Caprice Renshaw M.D.   On: 02/06/2023 13:11    Impression:   Pancreatitis, severe, manifested by extensive inflammation, necrosis (without obvious infection), elevated LFTs. Elevated LFTs, suspect from pancreatitis, doubt cbd stone. Gallstones and occasional alcohol use.  Can cause pancreatitis but hypertriglyceridemia is much more likely suspected cause, in absence of recurrent attack of pancreatitis with normal triglyceride levels. Hypertriglyceridemia.  Levels over 5000 upon admission.  Cause of pancreatitis until proven otherwise.  Plan:   Follow LFTs.  If continued uptrend, could consider ERCP, but hesitant to do that as pancreatitis is main risk of this procedure and, with improvement of his pancreatitis, I would suspect he will get improvement of his LFTs without ERCP. Counseled to avoid alcohol, although as I've mentioned to patient, I doubt this is cause of his pancreatitis. Had worsening pain with soft diet; will de-escalate to clears. Supportive management pancreatitis including IV fluids, judicious analgesics. Will need close follow-up imaging, exact timing of which will depend on clinical course. GI team will follow.   LOS: 7 days   Elijah Michaelis M  02/07/2023, 12:28 PM  Cell [phone] If no answer or after 5 PM call [phone]

## 2023-02-08 ENCOUNTER — Inpatient Hospital Stay (HOSPITAL_COMMUNITY): Payer: 59

## 2023-02-08 DIAGNOSIS — Z794 Long term (current) use of insulin: Secondary | ICD-10-CM

## 2023-02-08 DIAGNOSIS — R7989 Other specified abnormal findings of blood chemistry: Secondary | ICD-10-CM

## 2023-02-08 DIAGNOSIS — E119 Type 2 diabetes mellitus without complications: Secondary | ICD-10-CM

## 2023-02-08 DIAGNOSIS — D72829 Elevated white blood cell count, unspecified: Secondary | ICD-10-CM | POA: Diagnosis not present

## 2023-02-08 DIAGNOSIS — K8591 Acute pancreatitis with uninfected necrosis, unspecified: Secondary | ICD-10-CM | POA: Diagnosis not present

## 2023-02-08 DIAGNOSIS — E781 Pure hyperglyceridemia: Secondary | ICD-10-CM

## 2023-02-08 DIAGNOSIS — K859 Acute pancreatitis without necrosis or infection, unspecified: Secondary | ICD-10-CM | POA: Diagnosis not present

## 2023-02-08 LAB — BASIC METABOLIC PANEL
Anion gap: 8 (ref 5–15)
BUN: 10 mg/dL (ref 6–20)
CO2: 29 mmol/L (ref 22–32)
Calcium: 8.3 mg/dL — ABNORMAL LOW (ref 8.9–10.3)
Chloride: 94 mmol/L — ABNORMAL LOW (ref 98–111)
Creatinine, Ser: 0.64 mg/dL (ref 0.61–1.24)
GFR, Estimated: >60 mL/min (ref >60)
Glucose, Bld: 148 mg/dL — ABNORMAL HIGH (ref 70–99)
Potassium: 3.9 mmol/L (ref 3.5–5.1)
Sodium: 131 mmol/L — ABNORMAL LOW (ref 135–145)

## 2023-02-08 LAB — SODIUM, URINE, RANDOM: Sodium, Ur: 84 mmol/L

## 2023-02-08 LAB — LIPASE, BLOOD: Lipase: 74 U/L — ABNORMAL HIGH (ref 11–51)

## 2023-02-08 LAB — OSMOLALITY: Osmolality: 277 mOsm/kg (ref 275–295)

## 2023-02-08 LAB — CBC WITH DIFFERENTIAL/PLATELET
Abs Immature Granulocytes: 0.16 10*3/uL — ABNORMAL HIGH (ref 0.00–0.07)
Basophils Absolute: 0.1 10*3/uL (ref 0.0–0.1)
Basophils Relative: 0 %
Eosinophils Absolute: 0.1 10*3/uL (ref 0.0–0.5)
Eosinophils Relative: 1 %
HCT: 39.5 % (ref 39.0–52.0)
Hemoglobin: 12.8 g/dL — ABNORMAL LOW (ref 13.0–17.0)
Immature Granulocytes: 1 %
Lymphocytes Relative: 9 %
Lymphs Abs: 1.5 10*3/uL (ref 0.7–4.0)
MCH: 27.7 pg (ref 26.0–34.0)
MCHC: 32.4 g/dL (ref 30.0–36.0)
MCV: 85.5 fL (ref 80.0–100.0)
Monocytes Absolute: 1.3 10*3/uL — ABNORMAL HIGH (ref 0.1–1.0)
Monocytes Relative: 8 %
Neutro Abs: 13 10*3/uL — ABNORMAL HIGH (ref 1.7–7.7)
Neutrophils Relative %: 81 %
Platelets: 208 10*3/uL (ref 150–400)
RBC: 4.62 MIL/uL (ref 4.22–5.81)
RDW: 14 % (ref 11.5–15.5)
WBC: 16.1 10*3/uL — ABNORMAL HIGH (ref 4.0–10.5)
nRBC: 0 % (ref 0.0–0.2)

## 2023-02-08 LAB — HEPATITIS PANEL, ACUTE: Hep A IgM: NEGATIVE — AB

## 2023-02-08 LAB — COMPREHENSIVE METABOLIC PANEL
ALT: 84 U/L — ABNORMAL HIGH (ref 0–44)
AST: 100 U/L — ABNORMAL HIGH (ref 15–41)
Albumin: 2.7 g/dL — ABNORMAL LOW (ref 3.5–5.0)
Alkaline Phosphatase: 117 U/L (ref 38–126)
Anion gap: 11 (ref 5–15)
BUN: 11 mg/dL (ref 6–20)
CO2: 23 mmol/L (ref 22–32)
Calcium: 8.1 mg/dL — ABNORMAL LOW (ref 8.9–10.3)
Chloride: 94 mmol/L — ABNORMAL LOW (ref 98–111)
Creatinine, Ser: 0.64 mg/dL (ref 0.61–1.24)
GFR, Estimated: >60 mL/min (ref >60)
Glucose, Bld: 215 mg/dL — ABNORMAL HIGH (ref 70–99)
Potassium: 4 mmol/L (ref 3.5–5.1)
Sodium: 128 mmol/L — ABNORMAL LOW (ref 135–145)
Total Bilirubin: 8.9 mg/dL — ABNORMAL HIGH (ref 0.3–1.2)
Total Protein: 6.5 g/dL (ref 6.5–8.1)

## 2023-02-08 LAB — GLUCOSE, CAPILLARY
Glucose-Capillary: 113 mg/dL — ABNORMAL HIGH (ref 70–99)
Glucose-Capillary: 128 mg/dL — ABNORMAL HIGH (ref 70–99)
Glucose-Capillary: 156 mg/dL — ABNORMAL HIGH (ref 70–99)
Glucose-Capillary: 171 mg/dL — ABNORMAL HIGH (ref 70–99)
Glucose-Capillary: 176 mg/dL — ABNORMAL HIGH (ref 70–99)
Glucose-Capillary: 230 mg/dL — ABNORMAL HIGH (ref 70–99)

## 2023-02-08 LAB — MAGNESIUM: Magnesium: 2 mg/dL (ref 1.7–2.4)

## 2023-02-08 LAB — TRIGLYCERIDES: Triglycerides: 191 mg/dL — ABNORMAL HIGH (ref <150)

## 2023-02-08 LAB — PHOSPHORUS: Phosphorus: 4.3 mg/dL (ref 2.5–4.6)

## 2023-02-08 MED ORDER — SODIUM CHLORIDE 0.9 % IV SOLN
1.0000 g | Freq: Three times a day (TID) | INTRAVENOUS | Status: DC
  Administered 2023-02-08 – 2023-02-15 (×21): 1 g via INTRAVENOUS
  Filled 2023-02-08 (×23): qty 20

## 2023-02-08 MED ORDER — METRONIDAZOLE 500 MG/100ML IV SOLN
500.0000 mg | Freq: Three times a day (TID) | INTRAVENOUS | Status: DC
  Administered 2023-02-08 – 2023-02-11 (×8): 500 mg via INTRAVENOUS
  Filled 2023-02-08 (×8): qty 100

## 2023-02-08 MED ORDER — PIPERACILLIN-TAZOBACTAM 3.375 G IVPB: 3.3750 g | Freq: Three times a day (TID) | INTRAVENOUS | Status: DC

## 2023-02-08 NOTE — Progress Notes (Signed)
MEWS Progress Note  Patient Details Name: Jaedon Derksen MRN: [id number] DOB: 1986-02-25 Today's Date: 02/08/2023   MEWS Flowsheet Documentation:  Assess: MEWS Score Temp: 99.3 F (37.4 C) BP: (!) 141/97 MAP (mmHg): 109 Pulse Rate: (!) 101 ECG Heart Rate: (!) 105 Resp: 18 Level of Consciousness: Alert SpO2: 97 % O2 Device: Room Air Patient Activity (if Appropriate): In bed Assess: MEWS Score MEWS Temp: 0 MEWS Systolic: 0 MEWS Pulse: 1 MEWS RR: 0 MEWS LOC: 0 MEWS Score: 1 MEWS Score Color: Green Assess: SIRS CRITERIA SIRS Temperature : 0 SIRS Respirations : 0 SIRS Pulse: 1 SIRS WBC: 0 SIRS Score Sum : 1 SIRS Temperature : 0 SIRS Pulse: 1 SIRS Respirations : 0 SIRS WBC: 0 SIRS Score Sum : 1        Josiah Nieto S Burgandy Hackworth 02/08/2023, 12:56 AM

## 2023-02-08 NOTE — Progress Notes (Signed)
Pharmacy Antibiotic Note  George Howe is a 37 y.o. male with acute pancreatitis secondary to hypertriglyceridemia and concern for necrosis.  Pharmacy has been consulted for Zosyn dosing, to start after blood culture drawn this AM.  Plan: After blood cutlure drawn, begin Zosyn 3.375 grams IV q8h (each dose to infuse over 4 hours).  Height: 5\' 4"  (162.6 cm) Weight: 99.6 kg (219 lb 9.3 oz) IBW/kg (Calculated) : 59.2  Temp (24hrs), Avg:99.2 F (37.3 C), Min:97.5 F (36.4 C), Max:101 F (38.3 C)  Recent Labs  Lab 02/03/23 0355 02/04/23 0410 02/05/23 0449 02/06/23 0420 02/07/23 0417 02/08/23 0421  WBC 9.8 10.9* 14.6* 14.9* 15.4* 16.1*  CREATININE 0.84 0.53* 0.77 0.87 0.68  --     Estimated Creatinine Clearance: 136.1 mL/min (by C-G formula based on SCr of 0.68 mg/dL).    No Known Allergies  Antimicrobials this admission: 5/17 Zosyn >>   Microbiology results: 5/9 BCx: NGF 5/9 MRSA PCR: negative  Thank you for allowing pharmacy to be a part of this patient's care.  Elie Goody, PharmD, BCPS Clinical Pharmacist 02/08/2023  8:15 AM

## 2023-02-08 NOTE — Progress Notes (Addendum)
Progress Note  Primary GI: Unassigned Teacher, English as a foreign language primary)   Subjective  Chief Complaint: Recurrent acute pancreatitis  No family was present at the time of my evaluation. Patient states first bout of pancreatitis was 3 years ago when triglycerides were in the 2000's, states has had 4 other times since then that he has not come to the hospital last time was 6 months ago.  Will last about 36 hours will improve with a liquid diet. This episode patient set up with severe left upper abdominal pain radiating to the right epigastric, no radiation to his back. Patient continues to have epigastric pain was advance to soft diet but had to go back to clear liquids due to discomfort.  States he had shortness of breath several days ago but this is improved.  Patient denies any nausea vomiting.  He did have a fever of 101 last night since discontinuing Tylenol, this is back down. Patient states he just had Dilaudid pains currently at 2 out of 10. Rare alcohol Saturday, he does smoke cigarettes. Had 2 loose stools daily and last one was at 4 AM some gas and bloating.  Denies melena or hematochezia.    Objective   Vital signs in last 24 hours: Temp:  [97.5 F (36.4 C)-101 F (38.3 C)] 97.5 F (36.4 C) (05/17 0607) Pulse Rate:  [91-108] 101 (05/17 0607) Resp:  [18] 18 (05/17 0607) BP: (133-143)/(82-100) 134/92 (05/17 0607) SpO2:  [97 %-98 %] 97 % (05/17 0607) Last BM Date : 02/06/23 Last BM recorded by nurses in past 5 days Stool Type: Type 5 (Soft blobs with clear-cut edges) (02/03/2023  5:00 AM)  General: Slightly jaundiced appearing male in no acute distress  Heart:  Regular rate and rhythm; no murmurs Pulm: Decreased breath sounds left lower lung more so than right lower lung otherwise CTAB. Abdomen:  Soft, Obese AB, Active bowel sounds.  Mild to moderate tenderness in the upper abdomen, worse left upper quadrant/epigastric Without guarding and Without rebound, No organomegaly  appreciated. Extremities:  without  edema. Neurologic:  Alert and  oriented x4;  No focal deficits.  Psych:  Cooperative. Normal mood and affect.  Intake/Output from previous day: 05/16 0701 - 05/17 0700 In: 853.4 [P.O.:360; I.V.:493.4] Out: -  Intake/Output this shift: No intake/output data recorded.  Studies/Results: MR ABDOMEN MRCP W WO CONTAST  Result Date: 02/07/2023 CLINICAL DATA:  Pancreatitis; gallbladder polyps versus stones on recent ultrasound. EXAM: MRI ABDOMEN WITHOUT AND WITH CONTRAST (INCLUDING MRCP) TECHNIQUE: Multiplanar multisequence MR imaging of the abdomen was performed both before and after the administration of intravenous contrast. Heavily T2-weighted images of the biliary and pancreatic ducts were obtained, and three-dimensional MRCP images were rendered by post processing. CONTRAST:  10mL GADAVIST GADOBUTROL 1 MMOL/ML IV SOLN COMPARISON:  CT scan 01/31/2023 and ultrasound 02/06/2023 FINDINGS: Despite efforts by the technologist and patient, motion artifact is present on today's exam and could not be eliminated. This reduces exam sensitivity and specificity. Lower chest: Up small bilateral pleural effusions with subsegmental atelectasis in the left lower lobe. Hepatobiliary: Mild steatosis in segments 5 and 8 of the liver. Faint dependent low signal in the gallbladder for example on image 23 series 3, suspicious for small gallstones. Motion artifact substantially obscures the MRCP images. There is tapering of the distal CBD in the vicinity of the pancreatic head probably related to extrinsic narrowing due to pancreatitis. I do not see a well-defined stone along the projected course of the common bile duct. Pancreas: Extensive pancreatic  inflammation with extensive peripancreatic edema and peripancreatic acute fluid collections. The previously hypoenhancing lesion along the pancreatic tail currently demonstrates internal high precontrast T1 signal, lack of enhancement, and  marginal low T2 signal suspicious for a collection of blood products and possible localized pancreatic necrosis. This measures 3.3 by 2.0 cm on image 22 series 3 There is a new region of suspected necrosis in the pancreatic head with lack of enhancement in a 3.7 by 2.1 cm pancreatic head lesion which demonstrates some internal high T1 signal compatible with blood products. Pancreatic abscess is a differential diagnostic consideration. Spleen:  Unremarkable Adrenals/Urinary Tract:  Unremarkable Stomach/Bowel: Unremarkable Vascular/Lymphatic: The splenic vein is not thought to be completely occluded although does seem to be attenuated, with some collateralization. Other:  Edema tracks along the paracolic gutters. Musculoskeletal: Unremarkable IMPRESSION: 1. Extensive pancreatic inflammation and acute peripancreatic fluid collections. Nonenhancing collections with proteinaceous and blood products in the pancreatic tail and separately in the pancreatic head, concerning for local pancreatic necrosis. No internal gas density to indicate gas forming abscesses at this time. 2. The splenic vein is not thought to be completely occluded although does seem to be attenuated, with some collateralization. 3. The MRCP images are substantially degraded by motion artifact, but there is tapering of the distal CBD in the vicinity of the pancreatic head probably related to extrinsic narrowing due to pancreatitis. No well-defined stone along the projected course of the common bile duct. 4. Suspected small gallstones dependently in the gallbladder, although poorly characterized. 5. Small bilateral pleural effusions with subsegmental atelectasis in the left lower lobe. 6. Mild steatosis in segments 5 and 8 of the liver. Electronically Signed   By: Gaylyn Rong M.D.   On: 02/07/2023 09:15   MR 3D Recon At Scanner  Result Date: 02/07/2023 CLINICAL DATA:  Pancreatitis; gallbladder polyps versus stones on recent ultrasound. EXAM: MRI  ABDOMEN WITHOUT AND WITH CONTRAST (INCLUDING MRCP) TECHNIQUE: Multiplanar multisequence MR imaging of the abdomen was performed both before and after the administration of intravenous contrast. Heavily T2-weighted images of the biliary and pancreatic ducts were obtained, and three-dimensional MRCP images were rendered by post processing. CONTRAST:  10mL GADAVIST GADOBUTROL 1 MMOL/ML IV SOLN COMPARISON:  CT scan 01/31/2023 and ultrasound 02/06/2023 FINDINGS: Despite efforts by the technologist and patient, motion artifact is present on today's exam and could not be eliminated. This reduces exam sensitivity and specificity. Lower chest: Up small bilateral pleural effusions with subsegmental atelectasis in the left lower lobe. Hepatobiliary: Mild steatosis in segments 5 and 8 of the liver. Faint dependent low signal in the gallbladder for example on image 23 series 3, suspicious for small gallstones. Motion artifact substantially obscures the MRCP images. There is tapering of the distal CBD in the vicinity of the pancreatic head probably related to extrinsic narrowing due to pancreatitis. I do not see a well-defined stone along the projected course of the common bile duct. Pancreas: Extensive pancreatic inflammation with extensive peripancreatic edema and peripancreatic acute fluid collections. The previously hypoenhancing lesion along the pancreatic tail currently demonstrates internal high precontrast T1 signal, lack of enhancement, and marginal low T2 signal suspicious for a collection of blood products and possible localized pancreatic necrosis. This measures 3.3 by 2.0 cm on image 22 series 3 There is a new region of suspected necrosis in the pancreatic head with lack of enhancement in a 3.7 by 2.1 cm pancreatic head lesion which demonstrates some internal high T1 signal compatible with blood products. Pancreatic abscess is a differential  diagnostic consideration. Spleen:  Unremarkable Adrenals/Urinary Tract:   Unremarkable Stomach/Bowel: Unremarkable Vascular/Lymphatic: The splenic vein is not thought to be completely occluded although does seem to be attenuated, with some collateralization. Other:  Edema tracks along the paracolic gutters. Musculoskeletal: Unremarkable IMPRESSION: 1. Extensive pancreatic inflammation and acute peripancreatic fluid collections. Nonenhancing collections with proteinaceous and blood products in the pancreatic tail and separately in the pancreatic head, concerning for local pancreatic necrosis. No internal gas density to indicate gas forming abscesses at this time. 2. The splenic vein is not thought to be completely occluded although does seem to be attenuated, with some collateralization. 3. The MRCP images are substantially degraded by motion artifact, but there is tapering of the distal CBD in the vicinity of the pancreatic head probably related to extrinsic narrowing due to pancreatitis. No well-defined stone along the projected course of the common bile duct. 4. Suspected small gallstones dependently in the gallbladder, although poorly characterized. 5. Small bilateral pleural effusions with subsegmental atelectasis in the left lower lobe. 6. Mild steatosis in segments 5 and 8 of the liver. Electronically Signed   By: Gaylyn Rong M.D.   On: 02/07/2023 09:15   US Abdomen Limited RUQ (LIVER/GB)  Result Date: 02/06/2023 CLINICAL DATA:  [id number] Elevated liver enzymes [id number] EXAM: ULTRASOUND ABDOMEN LIMITED RIGHT UPPER QUADRANT COMPARISON:  CT 01/31/2023 FINDINGS: Gallbladder: The gallbladder is mildly distended. There are 3 non-mobile foci noted along the gallbladder wall, largest measuring 4 mm, could be small polyps or stones. Trace intraluminal sludge. Negative sonographic Murphy sign. No wall thickening. Common bile duct: Diameter: 2.7 mm, normal.  No intrahepatic ductal dilation Liver: Diffusely increased liver echogenicity. Portal vein is patent on color Doppler imaging with  normal direction of blood flow towards the liver. Other: None. IMPRESSION: Diffuse hepatic steatosis. Three non-mobile echogenic foci, could be small polyps or stones, largest measuring 4 mm. No evidence of acute cholecystitis or biliary obstruction. Electronically Signed   By: Caprice Renshaw M.D.   On: 02/06/2023 13:11    Lab Results: Recent Labs    02/06/23 0420 02/07/23 0417 02/08/23 0421  WBC 14.9* 15.4* 16.1*  HGB 13.3 12.6* 12.8*  HCT 40.0 38.3* 39.5  PLT 170 193 208   BMET Recent Labs    02/06/23 0420 02/07/23 0417  NA 132* 130*  K 3.6 3.5  CL 94* 96*  CO2 26 25  GLUCOSE 110* 136*  BUN 11 13  CREATININE 0.87 0.68  CALCIUM 8.3* 8.1*   LFT Recent Labs    02/07/23 0417  PROT 6.4*  ALBUMIN 2.4*  AST 114*  ALT 79*  ALKPHOS 119  BILITOT 6.6*  BILIDIR 3.6*  IBILI 3.0*   PT/INR No results for input(s): "LABPROT", "INR" in the last 72 hours.   Scheduled Meds: . acetaminophen  650 mg Oral Once  . buPROPion  150 mg Oral Daily  . enoxaparin (LOVENOX) injection  40 mg Subcutaneous Q24H  . feeding supplement  237 mL Oral TID WC  . fenofibrate  160 mg Oral Daily  . insulin aspart  0-9 Units Subcutaneous Q4H  . insulin detemir  5 Units Subcutaneous BID  . multivitamin with minerals  1 tablet Oral Daily  . nicotine  14 mg Transdermal Daily  . polyethylene glycol  17 g Oral Daily  . potassium chloride  60 mEq Oral Once   Continuous Infusions: . lactated ringers 50 mL/hr at 02/07/23 1627      Patient profile:   37 year old male with insulin-dependent diabetes, history  of recurrent pancreatitis presents with acute pancreatitis associated with triglycerides over 5000 upon admission.   Impression/Plan:   Acute necrotizing pancreatitis most likely secondary to trigs Triglycerides initially 5000 upon admission, currently 191. Continuing increase and WBC, could be reactive, suspicious for infected necrotic pancreatitis may need to consider IR aspiration BUN 12.8 Cr  0.68  MRCP: Extensive pancreatic inflammation and acute peripancreatic fluid, nonenhancing collections with blood products and pancreatic tail and separately in the pancreatic head concerning for local pancreatic necrosis.  No internal gas density to indicate abscess.  -alcohol and smoking cessation counseling. -Continue supportive care with pain control, fluid replacement with LR, early ambulation.  -Advance diet as tolerated, will increase back up to full liquid diet -If the patient not tolerating an oral diet, would recommend consideration of nutritional support with a nasojejunal postpyloric feedings.  -Trend CBC, CMP -VTE prophylaxis, on lovenox 40 mg q 24 -Will follow along.  Leukocytosis concern for infected acute necrotic collection 02/08/2023 WBC 16.1  Tmax 101, currently 97.5, tachycardic otherwise stable Pending blood cultures Started on zosyn 05/17, would switch to flagyl and meropenem for more targeted pancreatic penetration Consider IR consultation if patient fails to improve with ABX for aspiration to better guide ABX therapy  Elevated LFTs Recent Labs  Lab 02/04/23 0410 02/05/23 0449 02/06/23 0420 02/07/23 0417 02/08/23 0909  AST 29 90* 115* 114* 100*  ALT 18 53* 69* 79* 84*  ALKPHOS 40 82 84 119 117  BILITOT 3.7* 6.4* 6.2*  6.2* 6.6* 8.9*  PROT 4.2* 6.7 6.3* 6.4* 6.5  ALBUMIN 1.9* 2.7* 2.5* 2.4* 2.7*    Tapering of the distal CBD in the vicinity of pancreatic head likely related to extrinsic narrowing due to pancreatitis.  No obvious choledocholithiasis. Some suspected small gallstones in the gallbladder, poorly characterized. -Mild steatosis of the liver -No need for ERCP at this time, likely from acute pancreatitis, will continue to monitor LFTs daily. -Continue supportive care -May ultimately benefit from consultation for cholecystectomy in outpatient setting High risk for MASH with metabolic syndrome/comorbidities, needs outpatient monitoring  2 loose  stools, some bloating abdominal discomfort Possible EPI, start Creon with patient p.o.  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  Hypertriglyceridemia Continue fenofibrate can consider Vascepa  Type 2 diabetes insulin-dependent Strong family history, question LADA Has outpatient endocrinology follow-up in November   Active Problems:   Acute pancreatitis    LOS: 8 days   Doree Albee  02/08/2023, 8:11 AM  GI ATTENDING  Interval history and data reviewed.  X-rays reviewed.  Agree with interval progress note as outlined above.  Patient with acute pancreatitis.  Has been hospitalized for over 1 week.  Not taking nutrition by mouth.  Has evidence of necrosis on imaging.  Had fever overnight.  Etiology of pancreatitis not clear.  Though it has been described to hypertriglyceridemia, elevated triglycerides in the face of acute pancreatitis are difficult to interpret.  His outpatient triglycerides have been not terribly high.  Does have evidence of stone and sludge on imaging.  At this point the priorities are as follows: 1.  Change from Zosyn to meropenem and Flagyl for maximal pancreatic penetration with concern being possible infected necrosis 9 days out with fever 2.  Needs nutrition.  Recommend nasogastric feeding.  Check with dietary regarding best formulation.  Sit the patient up while feeding to reduce the risk of aspiration. 3.  No need to check lipase.  Has no clinical relevance at this point. 4.  Pain control per primary service.  Other supportive measures per primary service such as IV hydration and DVT prophylaxis. 5.  Long-term, after complete recovery from pancreatitis, I would advocate  for laparoscopic cholecystectomy Will follow.  Wilhemina Bonito. Eda Keys., M.D. [facility] Division of Gastroenterology

## 2023-02-08 NOTE — Progress Notes (Signed)
Pharmacy Antibiotic Note  George Howe is a 37 y.o. male with acute pancreatitis secondary to hypertriglyceridemia and concern for necrosis.  Orders received from GI to transition patient from Zosyn to meropenem.  Plan: DC Zosyn. Begin meropenem 1 gram IV q8h  Height: 5\' 4"  (162.6 cm) Weight: 99.6 kg (219 lb 9.3 oz) IBW/kg (Calculated) : 59.2  Temp (24hrs), Avg:99.2 F (37.3 C), Min:97.5 F (36.4 C), Max:101 F (38.3 C)  Recent Labs  Lab 02/04/23 0410 02/05/23 0449 02/06/23 0420 02/07/23 0417 02/08/23 0421 02/08/23 0909  WBC 10.9* 14.6* 14.9* 15.4* 16.1*  --   CREATININE 0.53* 0.77 0.87 0.68  --  0.64     Estimated Creatinine Clearance: 136.1 mL/min (by C-G formula based on SCr of 0.64 mg/dL).    No Known Allergies  Antimicrobials this admission: 5/17 Zosyn >>5/17 5/17 meropenem >>   Microbiology results: 5/9 BCx: NGF 5/9 MRSA PCR: negative 5/17: BCx: collected  Thank you for allowing pharmacy to be a part of this patient's care.  Elie Goody, PharmD, BCPS Clinical Pharmacist 02/08/2023  12:25 PM

## 2023-02-08 NOTE — Progress Notes (Addendum)
PROGRESS NOTE    George Howe  ZHY:[id number] DOB: November 09, 1985 DOA: 01/31/2023 PCP: [name], [name], MD  Chief Complaint  Patient presents with   Abdominal Pain    Brief Narrative:   37 year old with history of type 2 diabetes on insulin, hypertriglyceridemia on Lipitor, previous episodes of pancreatitis secondary to hypertriglyceridemia, chronic anxiety and depression presented with 2 days of epigastric pain and back pain. In the emergency room triglycerides more than 5000, lipase 653, serum glucose 313 and anion gap of 16. Clinically diagnosed as pancreatitis, started on IV fluids. CT scan showed moderate peripancreatic fat stranding consistent with acute pancreatitis and suspicious for necrosis at the pancreatic tail. Hepatic steatosis. Admitted with acute pancreatitis.   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  Fever:  CT with moderate peripancreatic fat stranding c/w acute pancreatitis and focal hypoattenuation of the pancreatic tail concerning for necrosis  MRCP with extensive pancreatic inflammation and acute peripancreatic fluid collections, nonenhancing collections with proteinaceous and blood products in the pancreatic tail and separately in the pancreatic head concerning for local pancreatic necrosis --- splenic vein not thought to be completely occluded, but attenuated, small gallstones, small bilateral effusions (see report) With new fever, culture and start abx Currently on fulls, ADAT Continue IVF, IV, PO pain meds prn  Strict I/O, daily weights Appreciate GI recs -> recommended NG tube, but currently seems to be tolerating PO, will hold off for now and adat Needs lap chole long term  Hyperbilirubinemia  Elevated Liver Enzymes Rising today CT 5/9 with layering hyperdensity in gallbladder neck RUQ [place] 5/15 with diffuse hepatic steatosis, 3 nonmobile  echogenic foci (small polyps or stones) MRCP with tapering of distal CBD in vicinity of pancreatic head related to extrinsic narrowing due to pancreatitis GI consulted, appreciate recs -> consider ERCP, appreciate further recs Hold statin, may need to hold fibrate  Hypertriglyceridemia  Dyslipidemia Improved after insulin gtt Will continue to monitor on fenofibrate Hold statin with elevated LFT's Needs lipid clinic at follow up   Attenuation of Splenic Vein Discussed with radiology, low suspicion for thrombus, suspects this is due to inflammation/narrowing  Insulin-dependent type 2 diabetes, hypoglycemia: Blood sugars are acceptable now. long-acting insulin, 5 units twice daily, continue sliding scale insulin.   Chronic anxiety depression: Currently on Wellbutrin.  Will continue.   Smoker: Counseled to quit.  Provide nicotine patch.   Hypocalcemia improved   Hypophosphatemia: follow   Hypomagnesemia: follow   Hypokalemia: follow   Hyponatremia: mild, follow      DVT prophylaxis: lovenox Code Status: full Family Communication: none Disposition:   Status is: Inpatient Remains inpatient appropriate because: need for continued inpatient care   Consultants:  GI  Procedures:  none  Antimicrobials:  Anti-infectives (From admission, onward)    Start     Dose/Rate Route Frequency Ordered Stop   02/08/23 2200  metroNIDAZOLE (FLAGYL) IVPB 500 mg        500 mg 100 mL/hr over 60 Minutes Intravenous Every 8 hours 02/08/23 1324 02/18/23 2359   02/08/23 1200  meropenem (MERREM) 1 g in sodium chloride 0.9 % 100 mL IVPB        1 g 200 mL/hr over 30 Minutes Intravenous Every 8 hours 02/08/23 1105     02/08/23 1000  piperacillin-tazobactam (ZOSYN) IVPB 3.375 g  Status:  Discontinued        3.375 g 12.5 mL/hr over 240 Minutes Intravenous Every  8 hours 02/08/23 0900 02/08/23 1105   02/01/23 1300  cefTRIAXone (ROCEPHIN) 2 g in sodium chloride 0.9 % 100 mL IVPB  Status:   Discontinued        2 g 200 mL/hr over 30 Minutes Intravenous Every 24 hours 01/31/23 1607 02/01/23 0748   01/31/23 1215  cefTRIAXone (ROCEPHIN) 2 g in sodium chloride 0.9 % 100 mL IVPB        2 g 200 mL/hr over 30 Minutes Intravenous  Once 01/31/23 1203 01/31/23 1313   01/31/23 1215  metroNIDAZOLE (FLAGYL) IVPB 500 mg  Status:  Discontinued        500 mg 100 mL/hr over 60 Minutes Intravenous Every 8 hours 01/31/23 1203 02/01/23 0748       Subjective: Pain is George Howe bit worse He continues to deny that it's worse with eating  Objective: Vitals:   02/07/23 2130 02/07/23 2330 02/08/23 0607 02/08/23 1418  BP: (!) 141/97 (!) 143/90 (!) 134/92 124/86  Pulse: (!) 101 (!) 101 (!) 101 97  Resp: 18 18 18 18   Temp: 99.3 F (37.4 C) 99.3 F (37.4 C) (!) 97.5 F (36.4 C) 98.8 F (37.1 C)  TempSrc: Oral  Oral Oral  SpO2: 97% 98% 97% 99%  Weight:      Height:        Intake/Output Summary (Last 24 hours) at 02/08/2023 1530 Last data filed at 02/08/2023 1300 Gross per 24 hour  Intake 605.34 ml  Output --  Net 605.34 ml   Filed Weights   01/31/23 0725 01/31/23 1500  Weight: 97.5 kg 99.6 kg    Examination:  General: No acute distress. Cardiovascular: RRR Lungs: unlabroed Abdomen: epiagstric TTP, seems George Howe bit worse today Neurological: Alert and oriented 3. Moves all extremities 4 with equal strength. Cranial nerves II through XII grossly intact. Extremities: No clubbing or cyanosis. No edema.   Data Reviewed: I have personally reviewed following labs and imaging studies  CBC: Recent Labs  Lab 02/04/23 0410 02/05/23 0449 02/06/23 0420 02/07/23 0417 02/08/23 0421  WBC 10.9* 14.6* 14.9* 15.4* 16.1*  NEUTROABS 8.6* 11.5* 11.7* 12.4* 13.0*  HGB 10.6* 14.1 13.3 12.6* 12.8*  HCT 32.5* 42.3 40.0 38.3* 39.5  MCV 88.6 85.5 84.4 84.5 85.5  PLT 109* 166 170 193 208    Basic Metabolic Panel: Recent Labs  Lab 02/04/23 0410 02/05/23 0449 02/06/23 0420 02/07/23 0417  02/08/23 0421 02/08/23 0909  NA 134* 134* 132* 130*  --  128*  K 2.7* 4.2 3.6 3.5  --  4.0  CL 109 96* 94* 96*  --  94*  CO2 19* 28 26 25   --  23  GLUCOSE 84 147* 110* 136*  --  215*  BUN 7 9 11 13   --  11  CREATININE 0.53* 0.77 0.87 0.68  --  0.64  CALCIUM 5.4* 8.3* 8.3* 8.1*  --  8.1*  MG 1.5* 2.0 2.0 2.1 2.0  --   PHOS 1.5* 3.0 3.8 4.0 4.3  --     GFR: Estimated Creatinine Clearance: 136.1 mL/min (by C-G formula based on SCr of 0.64 mg/dL).  Liver Function Tests: Recent Labs  Lab 02/04/23 0410 02/05/23 0449 02/06/23 0420 02/07/23 0417 02/08/23 0909  AST 29 90* 115* 114* 100*  ALT 18 53* 69* 79* 84*  ALKPHOS 40 82 84 119 117  BILITOT 3.7* 6.4* 6.2*  6.2* 6.6* 8.9*  PROT 4.2* 6.7 6.3* 6.4* 6.5  ALBUMIN 1.9* 2.7* 2.5* 2.4* 2.7*    CBG: Recent Labs  Lab 02/07/23 2012 02/07/23 2353 02/08/23 0339 02/08/23 0735 02/08/23 1218  GLUCAP 257* 160* 113* 156* 176*     Recent Results (from the past 240 hour(s))  MRSA Next Gen by PCR, Nasal     Status: None   Collection Time: 01/31/23  3:17 PM   Specimen: Nasal Mucosa; Nasal Swab  Result Value Ref Range Status   MRSA by PCR Next Gen NOT DETECTED NOT DETECTED Final    Comment: (NOTE) The GeneXpert MRSA Assay (FDA approved for NASAL specimens only), is one component of [facility] comprehensive MRSA colonization surveillance program. It is not intended to diagnose MRSA infection nor to guide or monitor treatment for MRSA infections. Test performance is not FDA approved in patients less than 48 years old. Performed at [facility], [address], [place], [place] [address]   Culture, blood (Routine X 2) w Reflex to ID Panel     Status: None   Collection Time: 01/31/23  4:20 PM   Specimen: BLOOD RIGHT ARM  Result Value Ref Range Status   Specimen Description   Final    BLOOD RIGHT ARM Performed at [facility], [address] [place], [place] [address]    Special Requests   Final    BOTTLES DRAWN  AEROBIC ONLY Blood Culture adequate volume Performed at [facility], [address] [place], [place] [address]    Culture   Final    NO GROWTH 5 DAYS Performed at [facility], [address] [place], [place] [address]    Report Status 02/05/2023 FINAL  Final  Culture, blood (Routine X 2) w Reflex to ID Panel     Status: None   Collection Time: 01/31/23  4:20 PM   Specimen: BLOOD RIGHT HAND  Result Value Ref Range Status   Specimen Description   Final    BLOOD RIGHT HAND Performed at [facility], [address], [place], [place] [address]    Special Requests   Final    BOTTLES DRAWN AEROBIC ONLY Blood Culture adequate volume Performed at [facility], [address], [place], [place] [address]    Culture   Final    NO GROWTH 5 DAYS Performed at [facility], [address], [place], [place] [address]    Report Status 02/05/2023 FINAL  Final         Radiology Studies: DG Chest Port 1 View  Result Date: 02/08/2023 CLINICAL DATA:  Pleural effusion. EXAM: PORTABLE CHEST 1 VIEW COMPARISON:  05/02/2021 FINDINGS: Cardiac and mediastinal contours are within normal limits. No focal pulmonary opacity. No pleural effusion or pneumothorax. No acute osseous abnormality. IMPRESSION: No acute cardiopulmonary process. Electronically Signed   By: Wiliam Ke M.D.   On: 02/08/2023 15:21   MR ABDOMEN MRCP W WO CONTAST  Result Date: 02/07/2023 CLINICAL DATA:  Pancreatitis; gallbladder polyps versus stones on recent ultrasound. EXAM: MRI ABDOMEN WITHOUT AND WITH CONTRAST (INCLUDING MRCP) TECHNIQUE: Multiplanar multisequence MR imaging of the abdomen was performed both before and after the administration of intravenous contrast. Heavily T2-weighted images of the biliary and pancreatic ducts were obtained, and three-dimensional MRCP images were rendered by post processing. CONTRAST:  10mL GADAVIST GADOBUTROL 1 MMOL/ML IV SOLN COMPARISON:   CT scan 01/31/2023 and ultrasound 02/06/2023 FINDINGS: Despite efforts by the technologist and patient, motion artifact is present on today's exam and could not be eliminated. This reduces exam sensitivity and specificity. Lower chest: Up small bilateral  pleural effusions with subsegmental atelectasis in the left lower lobe. Hepatobiliary: Mild steatosis in segments 5 and 8 of the liver. Faint dependent low signal in the gallbladder for example on image 23 series 3, suspicious for small gallstones. Motion artifact substantially obscures the MRCP images. There is tapering of the distal CBD in the vicinity of the pancreatic head probably related to extrinsic narrowing due to pancreatitis. I do not see Starlin Steib well-defined stone along the projected course of the common bile duct. Pancreas: Extensive pancreatic inflammation with extensive peripancreatic edema and peripancreatic acute fluid collections. The previously hypoenhancing lesion along the pancreatic tail currently demonstrates internal high precontrast T1 signal, lack of enhancement, and marginal low T2 signal suspicious for Carrie Usery collection of blood products and possible localized pancreatic necrosis. This measures 3.3 by 2.0 cm on image 22 series 3 There is Keyana Guevara new region of suspected necrosis in the pancreatic head with lack of enhancement in Brit Carbonell 3.7 by 2.1 cm pancreatic head lesion which demonstrates some internal high T1 signal compatible with blood products. Pancreatic abscess is Dakoda Laventure differential diagnostic consideration. Spleen:  Unremarkable Adrenals/Urinary Tract:  Unremarkable Stomach/Bowel: Unremarkable Vascular/Lymphatic: The splenic vein is not thought to be completely occluded although does seem to be attenuated, with some collateralization. Other:  Edema tracks along the paracolic gutters. Musculoskeletal: Unremarkable IMPRESSION: 1. Extensive pancreatic inflammation and acute peripancreatic fluid collections. Nonenhancing collections with proteinaceous and  blood products in the pancreatic tail and separately in the pancreatic head, concerning for local pancreatic necrosis. No internal gas density to indicate gas forming abscesses at this time. 2. The splenic vein is not thought to be completely occluded although does seem to be attenuated, with some collateralization. 3. The MRCP images are substantially degraded by motion artifact, but there is tapering of the distal CBD in the vicinity of the pancreatic head probably related to extrinsic narrowing due to pancreatitis. No well-defined stone along the projected course of the common bile duct. 4. Suspected small gallstones dependently in the gallbladder, although poorly characterized. 5. Small bilateral pleural effusions with subsegmental atelectasis in the left lower lobe. 6. Mild steatosis in segments 5 and 8 of the liver. Electronically Signed   By: Gaylyn Rong M.D.   On: 02/07/2023 09:15   MR 3D Recon At Scanner  Result Date: 02/07/2023 CLINICAL DATA:  Pancreatitis; gallbladder polyps versus stones on recent ultrasound. EXAM: MRI ABDOMEN WITHOUT AND WITH CONTRAST (INCLUDING MRCP) TECHNIQUE: Multiplanar multisequence MR imaging of the abdomen was performed both before and after the administration of intravenous contrast. Heavily T2-weighted images of the biliary and pancreatic ducts were obtained, and three-dimensional MRCP images were rendered by post processing. CONTRAST:  10mL GADAVIST GADOBUTROL 1 MMOL/ML IV SOLN COMPARISON:  CT scan 01/31/2023 and ultrasound 02/06/2023 FINDINGS: Despite efforts by the technologist and patient, motion artifact is present on today's exam and could not be eliminated. This reduces exam sensitivity and specificity. Lower chest: Up small bilateral pleural effusions with subsegmental atelectasis in the left lower lobe. Hepatobiliary: Mild steatosis in segments 5 and 8 of the liver. Faint dependent low signal in the gallbladder for example on image 23 series 3, suspicious  for small gallstones. Motion artifact substantially obscures the MRCP images. There is tapering of the distal CBD in the vicinity of the pancreatic head probably related to extrinsic narrowing due to pancreatitis. I do not see Detrell Umscheid well-defined stone along the projected course of the common bile duct. Pancreas: Extensive pancreatic inflammation with extensive peripancreatic edema and peripancreatic acute fluid collections.  The previously hypoenhancing lesion along the pancreatic tail currently demonstrates internal high precontrast T1 signal, lack of enhancement, and marginal low T2 signal suspicious for Doloros Kwolek collection of blood products and possible localized pancreatic necrosis. This measures 3.3 by 2.0 cm on image 22 series 3 There is Remingtyn Depaola new region of suspected necrosis in the pancreatic head with lack of enhancement in Keaghan Bowens 3.7 by 2.1 cm pancreatic head lesion which demonstrates some internal high T1 signal compatible with blood products. Pancreatic abscess is Kirsten Spearing differential diagnostic consideration. Spleen:  Unremarkable Adrenals/Urinary Tract:  Unremarkable Stomach/Bowel: Unremarkable Vascular/Lymphatic: The splenic vein is not thought to be completely occluded although does seem to be attenuated, with some collateralization. Other:  Edema tracks along the paracolic gutters. Musculoskeletal: Unremarkable IMPRESSION: 1. Extensive pancreatic inflammation and acute peripancreatic fluid collections. Nonenhancing collections with proteinaceous and blood products in the pancreatic tail and separately in the pancreatic head, concerning for local pancreatic necrosis. No internal gas density to indicate gas forming abscesses at this time. 2. The splenic vein is not thought to be completely occluded although does seem to be attenuated, with some collateralization. 3. The MRCP images are substantially degraded by motion artifact, but there is tapering of the distal CBD in the vicinity of the pancreatic head probably related to  extrinsic narrowing due to pancreatitis. No well-defined stone along the projected course of the common bile duct. 4. Suspected small gallstones dependently in the gallbladder, although poorly characterized. 5. Small bilateral pleural effusions with subsegmental atelectasis in the left lower lobe. 6. Mild steatosis in segments 5 and 8 of the liver. Electronically Signed   By: Gaylyn Rong M.D.   On: 02/07/2023 09:15        Scheduled Meds:  acetaminophen  650 mg Oral Once   buPROPion  150 mg Oral Daily   enoxaparin (LOVENOX) injection  40 mg Subcutaneous Q24H   feeding supplement  237 mL Oral TID WC   fenofibrate  160 mg Oral Daily   insulin aspart  0-9 Units Subcutaneous Q4H   insulin detemir  5 Units Subcutaneous BID   multivitamin with minerals  1 tablet Oral Daily   nicotine  14 mg Transdermal Daily   polyethylene glycol  17 g Oral Daily   Continuous Infusions:  lactated ringers 50 mL/hr at 02/07/23 1627   meropenem (MERREM) IV 1 g (02/08/23 1212)   metronidazole       LOS: 8 days    Time spent: over 30 min    Lacretia Nicks, MD [facility]   To contact the attending provider between 7A-7P or the covering provider during after hours 7P-7A, please log into the web site [email or web address] and access using universal [place] password for that web site. If you do not have the password, please call the hospital operator.  02/08/2023, 3:30 PM

## 2023-02-09 DIAGNOSIS — K8591 Acute pancreatitis with uninfected necrosis, unspecified: Secondary | ICD-10-CM | POA: Diagnosis not present

## 2023-02-09 DIAGNOSIS — K8689 Other specified diseases of pancreas: Secondary | ICD-10-CM

## 2023-02-09 DIAGNOSIS — R935 Abnormal findings on diagnostic imaging of other abdominal regions, including retroperitoneum: Secondary | ICD-10-CM

## 2023-02-09 DIAGNOSIS — R1013 Epigastric pain: Secondary | ICD-10-CM

## 2023-02-09 DIAGNOSIS — D72829 Elevated white blood cell count, unspecified: Secondary | ICD-10-CM | POA: Diagnosis not present

## 2023-02-09 DIAGNOSIS — K859 Acute pancreatitis without necrosis or infection, unspecified: Secondary | ICD-10-CM | POA: Diagnosis not present

## 2023-02-09 DIAGNOSIS — Z794 Long term (current) use of insulin: Secondary | ICD-10-CM | POA: Diagnosis not present

## 2023-02-09 DIAGNOSIS — E119 Type 2 diabetes mellitus without complications: Secondary | ICD-10-CM | POA: Diagnosis not present

## 2023-02-09 LAB — COMPREHENSIVE METABOLIC PANEL
ALT: 76 U/L — ABNORMAL HIGH (ref 0–44)
AST: 83 U/L — ABNORMAL HIGH (ref 15–41)
Albumin: 2.3 g/dL — ABNORMAL LOW (ref 3.5–5.0)
Alkaline Phosphatase: 97 U/L (ref 38–126)
Anion gap: 10 (ref 5–15)
BUN: 11 mg/dL (ref 6–20)
CO2: 25 mmol/L (ref 22–32)
Calcium: 8.1 mg/dL — ABNORMAL LOW (ref 8.9–10.3)
Chloride: 96 mmol/L — ABNORMAL LOW (ref 98–111)
Creatinine, Ser: 0.71 mg/dL (ref 0.61–1.24)
GFR, Estimated: >60 mL/min (ref >60)
Glucose, Bld: 148 mg/dL — ABNORMAL HIGH (ref 70–99)
Potassium: 3.9 mmol/L (ref 3.5–5.1)
Sodium: 131 mmol/L — ABNORMAL LOW (ref 135–145)
Total Bilirubin: 10 mg/dL — ABNORMAL HIGH (ref 0.3–1.2)
Total Protein: 6.2 g/dL — ABNORMAL LOW (ref 6.5–8.1)

## 2023-02-09 LAB — CBC WITH DIFFERENTIAL/PLATELET
Abs Immature Granulocytes: 0.19 10*3/uL — ABNORMAL HIGH (ref 0.00–0.07)
Basophils Absolute: 0 10*3/uL (ref 0.0–0.1)
Basophils Relative: 0 %
Eosinophils Absolute: 0.1 10*3/uL (ref 0.0–0.5)
Eosinophils Relative: 1 %
HCT: 37.6 % — ABNORMAL LOW (ref 39.0–52.0)
Hemoglobin: 12.6 g/dL — ABNORMAL LOW (ref 13.0–17.0)
Immature Granulocytes: 1 %
Lymphocytes Relative: 11 %
Lymphs Abs: 1.6 10*3/uL (ref 0.7–4.0)
MCH: 28.5 pg (ref 26.0–34.0)
MCHC: 33.5 g/dL (ref 30.0–36.0)
MCV: 85.1 fL (ref 80.0–100.0)
Monocytes Absolute: 1.1 10*3/uL — ABNORMAL HIGH (ref 0.1–1.0)
Monocytes Relative: 8 %
Neutro Abs: 12 10*3/uL — ABNORMAL HIGH (ref 1.7–7.7)
Neutrophils Relative %: 79 %
Platelets: 211 10*3/uL (ref 150–400)
RBC: 4.42 MIL/uL (ref 4.22–5.81)
RDW: 13.7 % (ref 11.5–15.5)
WBC: 15 10*3/uL — ABNORMAL HIGH (ref 4.0–10.5)
nRBC: 0 % (ref 0.0–0.2)

## 2023-02-09 LAB — CULTURE, BLOOD (ROUTINE X 2): Special Requests: ADEQUATE

## 2023-02-09 LAB — URINALYSIS, ROUTINE W REFLEX MICROSCOPIC
Glucose, UA: 50 mg/dL — AB
Hgb urine dipstick: NEGATIVE
Ketones, ur: NEGATIVE mg/dL
Leukocytes,Ua: NEGATIVE
Nitrite: NEGATIVE
Protein, ur: NEGATIVE mg/dL
Specific Gravity, Urine: 1.009 (ref 1.005–1.030)
pH: 7 (ref 5.0–8.0)

## 2023-02-09 LAB — OSMOLALITY, URINE: Osmolality, Ur: 309 mosm/kg (ref 300–900)

## 2023-02-09 LAB — GLUCOSE, CAPILLARY
Glucose-Capillary: 140 mg/dL — ABNORMAL HIGH (ref 70–99)
Glucose-Capillary: 141 mg/dL — ABNORMAL HIGH (ref 70–99)
Glucose-Capillary: 184 mg/dL — ABNORMAL HIGH (ref 70–99)
Glucose-Capillary: 223 mg/dL — ABNORMAL HIGH (ref 70–99)
Glucose-Capillary: 246 mg/dL — ABNORMAL HIGH (ref 70–99)
Glucose-Capillary: 271 mg/dL — ABNORMAL HIGH (ref 70–99)

## 2023-02-09 LAB — PHOSPHORUS: Phosphorus: 3.7 mg/dL (ref 2.5–4.6)

## 2023-02-09 LAB — MAGNESIUM: Magnesium: 1.9 mg/dL (ref 1.7–2.4)

## 2023-02-09 LAB — BRAIN NATRIURETIC PEPTIDE: B Natriuretic Peptide: 56.4 pg/mL (ref 0.0–100.0)

## 2023-02-09 LAB — TRIGLYCERIDES: Triglycerides: 188 mg/dL — ABNORMAL HIGH (ref <150)

## 2023-02-09 LAB — LIPASE, BLOOD: Lipase: 87 U/L — ABNORMAL HIGH (ref 11–51)

## 2023-02-09 MED ORDER — BACLOFEN 10 MG PO TABS: 10.0000 mg | ORAL_TABLET | Freq: Three times a day (TID) | ORAL | Status: DC | PRN

## 2023-02-09 MED ORDER — PANCRELIPASE (LIP-PROT-AMYL) 12000-38000 UNITS PO CPEP
36000.0000 [IU] | ORAL_CAPSULE | Freq: Three times a day (TID) | ORAL | Status: DC
  Administered 2023-02-09 – 2023-02-15 (×18): 36000 [IU] via ORAL
  Filled 2023-02-09 (×18): qty 3

## 2023-02-09 MED ORDER — ENSURE ENLIVE PO LIQD
237.0000 mL | Freq: Two times a day (BID) | ORAL | Status: DC
  Administered 2023-02-10 – 2023-02-14 (×5): 237 mL via ORAL

## 2023-02-09 MED ORDER — PANTOPRAZOLE SODIUM 40 MG PO TBEC
40.0000 mg | DELAYED_RELEASE_TABLET | Freq: Every day | ORAL | Status: DC
  Administered 2023-02-09 – 2023-02-15 (×7): 40 mg via ORAL
  Filled 2023-02-09 (×7): qty 1

## 2023-02-09 MED ORDER — ENSURE MAX PROTEIN PO LIQD
11.0000 [oz_av] | Freq: Two times a day (BID) | ORAL | Status: DC
  Administered 2023-02-09 – 2023-02-14 (×8): 11 [oz_av] via ORAL
  Filled 2023-02-09 (×13): qty 330

## 2023-02-09 NOTE — Progress Notes (Signed)
PROGRESS NOTE    George Howe  ZOX:[id number] DOB: Nov 26, 1985 DOA: 01/31/2023 PCP: [name], [name], MD  Chief Complaint  Patient presents with   Abdominal Pain    Brief Narrative:   37 year old with history of type 2 diabetes on insulin, hypertriglyceridemia on Lipitor, previous episodes of pancreatitis secondary to hypertriglyceridemia, chronic anxiety and depression presented with 2 days of epigastric pain and back pain. In the emergency room triglycerides more than 5000, lipase 653, serum glucose 313 and anion gap of 16. Clinically diagnosed as pancreatitis, started on IV fluids. CT scan showed moderate peripancreatic fat stranding consistent with acute pancreatitis and suspicious for necrosis at the pancreatic tail. Hepatic steatosis. Admitted with acute pancreatitis.   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  Fever:  CT with moderate peripancreatic fat stranding c/w acute pancreatitis and focal hypoattenuation of the pancreatic tail concerning for necrosis  MRCP with extensive pancreatic inflammation and acute peripancreatic fluid collections, nonenhancing collections with proteinaceous and blood products in the pancreatic tail and separately in the pancreatic head concerning for local pancreatic necrosis --- splenic vein not thought to be completely occluded, but attenuated, small gallstones, small bilateral effusions (see report) Blood cultures pending Currently on soft diet, low fat, ADAT Continue IVF, IV, PO pain meds prn  Strict I/O (net positive 23 L), daily weights (overall weight down from admission) Appreciate GI recs -> recommended considering NG tube, but currently seems to be tolerating PO, will hold off for now and adat (calorie count, if not able to meet caloric needs, will need to place NG tube) Needs lap chole long term  Hyperbilirubinemia   Elevated Liver Enzymes Rising today again (will continue to monitor) CT 5/9 with layering hyperdensity in gallbladder neck RUQ [place] 5/15 with diffuse hepatic steatosis, 3 nonmobile echogenic foci (small polyps or stones) MRCP with tapering of distal CBD in vicinity of pancreatic head related to extrinsic narrowing due to pancreatitis GI consulted, appreciate recs  Hold statin, may need to hold fibrate  Hypertriglyceridemia  Dyslipidemia Improved after insulin gtt Will continue to monitor on fenofibrate Hold statin with elevated LFT's Needs lipid clinic at follow up   Attenuation of Splenic Vein Discussed with radiology, low suspicion for thrombus, suspects this is due to inflammation/narrowing  Insulin-dependent type 2 diabetes, hypoglycemia: Blood sugars are acceptable now. long-acting insulin, 5 units twice daily, continue sliding scale insulin.   Chronic anxiety depression: Currently on Wellbutrin.  Will continue.   Smoker: Counseled to quit.  Provide nicotine patch.   Hypocalcemia improved   Hypophosphatemia: follow   Hypomagnesemia: follow   Hypokalemia: follow   Hyponatremia: mild, follow     DVT prophylaxis: lovenox Code Status: full Family Communication: none Disposition:   Status is: Inpatient Remains inpatient appropriate because: need for continued inpatient care   Consultants:  GI  Procedures:  none  Antimicrobials:  Anti-infectives (From admission, onward)    Start     Dose/Rate Route Frequency Ordered Stop   02/08/23 2200  metroNIDAZOLE (FLAGYL) IVPB 500 mg        500 mg 100 mL/hr over 60 Minutes Intravenous Every 8 hours 02/08/23 1324 02/18/23 2359   02/08/23 1200  meropenem (MERREM) 1 g in sodium chloride 0.9 % 100 mL IVPB        1 g 200 mL/hr over 30 Minutes Intravenous Every 8 hours 02/08/23 1105     02/08/23 1000  piperacillin-tazobactam (ZOSYN)  IVPB 3.375 g  Status:  Discontinued        3.375 g 12.5 mL/hr over 240 Minutes Intravenous  Every 8 hours 02/08/23 0900 02/08/23 1105   02/01/23 1300  cefTRIAXone (ROCEPHIN) 2 g in sodium chloride 0.9 % 100 mL IVPB  Status:  Discontinued        2 g 200 mL/hr over 30 Minutes Intravenous Every 24 hours 01/31/23 1607 02/01/23 0748   01/31/23 1215  cefTRIAXone (ROCEPHIN) 2 g in sodium chloride 0.9 % 100 mL IVPB        2 g 200 mL/hr over 30 Minutes Intravenous  Once 01/31/23 1203 01/31/23 1313   01/31/23 1215  metroNIDAZOLE (FLAGYL) IVPB 500 mg  Status:  Discontinued        500 mg 100 mL/hr over 60 Minutes Intravenous Every 8 hours 01/31/23 1203 02/01/23 0748       Subjective: Pain continues to be persistent  Objective: Vitals:   02/08/23 1418 02/08/23 1955 02/09/23 0406 02/09/23 0447  BP: 124/86 133/83 134/89   Pulse: 97 98 86   Resp: 18 20 (!) 22   Temp: 98.8 F (37.1 C) 99.3 F (37.4 C) 98.2 F (36.8 C)   TempSrc: Oral Oral Oral   SpO2: 99% 98% 98%   Weight:    94.4 kg  Height:        Intake/Output Summary (Last 24 hours) at 02/09/2023 1148 Last data filed at 02/09/2023 0500 Gross per 24 hour  Intake 2666.09 ml  Output 1801 ml  Net 865.09 ml   Filed Weights   01/31/23 0725 01/31/23 1500 02/09/23 0447  Weight: 97.5 kg 99.6 kg 94.4 kg    Examination:  General: No acute distress. Icteric sclera, jaundiced Cardiovascular: RRR Lungs: unlabored Abdomen: diffuse abdominal TTP Neurological: Alert and oriented 3. Moves all extremities 4 with equal strength. Cranial nerves II through XII grossly intact. Extremities: No clubbing or cyanosis. No edema.  Data Reviewed: I have personally reviewed following labs and imaging studies  CBC: Recent Labs  Lab 02/05/23 0449 02/06/23 0420 02/07/23 0417 02/08/23 0421 02/09/23 0453  WBC 14.6* 14.9* 15.4* 16.1* 15.0*  NEUTROABS 11.5* 11.7* 12.4* 13.0* 12.0*  HGB 14.1 13.3 12.6* 12.8* 12.6*  HCT 42.3 40.0 38.3* 39.5 37.6*  MCV 85.5 84.4 84.5 85.5 85.1  PLT 166 170 193 208 211    Basic Metabolic Panel: Recent  Labs  Lab 02/05/23 0449 02/06/23 0420 02/07/23 0417 02/08/23 0421 02/08/23 0909 02/08/23 1552 02/09/23 0453  NA 134* 132* 130*  --  128* 131* 131*  K 4.2 3.6 3.5  --  4.0 3.9 3.9  CL 96* 94* 96*  --  94* 94* 96*  CO2 28 26 25   --  23 29 25   GLUCOSE 147* 110* 136*  --  215* 148* 148*  BUN 9 11 13   --  11 10 11   CREATININE 0.77 0.87 0.68  --  0.64 0.64 0.71  CALCIUM 8.3* 8.3* 8.1*  --  8.1* 8.3* 8.1*  MG 2.0 2.0 2.1 2.0  --   --  1.9  PHOS 3.0 3.8 4.0 4.3  --   --  3.7    GFR: Estimated Creatinine Clearance: 131.1 mL/min (by C-G formula based on SCr of 0.71 mg/dL).  Liver Function Tests: Recent Labs  Lab 02/05/23 0449 02/06/23 0420 02/07/23 0417 02/08/23 0909 02/09/23 0453  AST 90* 115* 114* 100* 83*  ALT 53* 69* 79* 84* 76*  ALKPHOS 82 84 119 117 97  BILITOT 6.4* 6.2*  6.2* 6.6* 8.9* 10.0*  PROT 6.7 6.3* 6.4* 6.5 6.2*  ALBUMIN 2.7* 2.5* 2.4* 2.7* 2.3*    CBG: Recent Labs  Lab 02/08/23 1953 02/08/23 2335 02/09/23 0404 02/09/23 0815 02/09/23 1117  GLUCAP 230* 171* 140* 141* 246*     Recent Results (from the past 240 hour(s))  MRSA Next Gen by PCR, Nasal     Status: None   Collection Time: 01/31/23  3:17 PM   Specimen: Nasal Mucosa; Nasal Swab  Result Value Ref Range Status   MRSA by PCR Next Gen NOT DETECTED NOT DETECTED Final    Comment: (NOTE) The GeneXpert MRSA Assay (FDA approved for NASAL specimens only), is one component of [place] comprehensive MRSA colonization surveillance program. It is not intended to diagnose MRSA infection nor to guide or monitor treatment for MRSA infections. Test performance is not FDA approved in patients less than 10 years old. Performed at [facility], [address] [place], [place] [address]   Culture, blood (Routine X 2) w Reflex to ID Panel     Status: None   Collection Time: 01/31/23  4:20 PM   Specimen: BLOOD RIGHT ARM  Result Value Ref Range Status   Specimen Description   Final    BLOOD  RIGHT ARM Performed at [facility], [address], [place], [place] [address]    Special Requests   Final    BOTTLES DRAWN AEROBIC ONLY Blood Culture adequate volume Performed at [facility], [address] [place], [place] [address]    Culture   Final    NO GROWTH 5 DAYS Performed at [facility], [address] [place], [place] [address]    Report Status 02/05/2023 FINAL  Final  Culture, blood (Routine X 2) w Reflex to ID Panel     Status: None   Collection Time: 01/31/23  4:20 PM   Specimen: BLOOD RIGHT HAND  Result Value Ref Range Status   Specimen Description   Final    BLOOD RIGHT HAND Performed at [facility], [address] [place], [place] [address]    Special Requests   Final    BOTTLES DRAWN AEROBIC ONLY Blood Culture adequate volume Performed at [facility], [address], [place], [place] [address]    Culture   Final    NO GROWTH 5 DAYS Performed at [facility], [address], [place], [place] [address]    Report Status 02/05/2023 FINAL  Final  Culture, blood (Routine X 2) w Reflex to ID Panel     Status: None (Preliminary result)   Collection Time: 02/08/23  9:01 AM   Specimen: BLOOD  Result Value Ref Range Status   Specimen Description   Final    BLOOD BLOOD RIGHT ARM Performed at [facility], [address] [place], [place] [address]    Special Requests   Final    BOTTLES DRAWN AEROBIC AND ANAEROBIC Blood Culture adequate volume Performed at [facility], [address], [place], [place] [address]    Culture   Final    NO GROWTH < 24 HOURS Performed at [facility], [address], [place], [place] [address]    Report Status PENDING  Incomplete  Culture, blood (Routine X 2) w Reflex to ID Panel     Status: None (Preliminary result)   Collection Time: 02/08/23  9:09 AM   Specimen: BLOOD  Result Value Ref  Range Status   Specimen  Description   Final    BLOOD BLOOD RIGHT ARM Performed at [facility], [address], [place], [place] [address]    Special Requests   Final    BOTTLES DRAWN AEROBIC AND ANAEROBIC Blood Culture adequate volume Performed at [facility], [address] [place], [place] [address]    Culture   Final    NO GROWTH < 24 HOURS Performed at [facility], [address] [place], [place] [address]    Report Status PENDING  Incomplete         Radiology Studies: DG Chest Port 1 View  Result Date: 02/08/2023 CLINICAL DATA:  Pleural effusion. EXAM: PORTABLE CHEST 1 VIEW COMPARISON:  05/02/2021 FINDINGS: Cardiac and mediastinal contours are within normal limits. No focal pulmonary opacity. No pleural effusion or pneumothorax. No acute osseous abnormality. IMPRESSION: No acute cardiopulmonary process. Electronically Signed   By: Wiliam Ke M.D.   On: 02/08/2023 15:21        Scheduled Meds:  acetaminophen  650 mg Oral Once   buPROPion  150 mg Oral Daily   enoxaparin (LOVENOX) injection  40 mg Subcutaneous Q24H   feeding supplement  237 mL Oral TID WC   fenofibrate  160 mg Oral Daily   insulin aspart  0-9 Units Subcutaneous Q4H   insulin detemir  5 Units Subcutaneous BID   lipase/protease/amylase  36,000 Units Oral TID AC   multivitamin with minerals  1 tablet Oral Daily   nicotine  14 mg Transdermal Daily   pantoprazole  40 mg Oral Daily   polyethylene glycol  17 g Oral Daily   Continuous Infusions:  lactated ringers 50 mL/hr at 02/09/23 0157   meropenem (MERREM) IV 1 g (02/09/23 0433)   metronidazole 500 mg (02/09/23 2841)     LOS: 9 days    Time spent: over 30 min    Lacretia Nicks, MD [facility]   To contact the attending provider between 7A-7P or the covering provider during after hours 7P-7A, please log into the web site [email or web address] and access using universal [place] password for that web site. If you do  not have the password, please call the hospital operator.  02/09/2023, 11:48 AM

## 2023-02-09 NOTE — Progress Notes (Signed)
Initial Nutrition Assessment  DOCUMENTATION CODES:   Obesity unspecified  INTERVENTION:  - Soft, Low Fat diet per MD.  - Ensure Plus High Protein po BID, each supplement provides 350 kcal and 20 grams of protein. - Ensure Max po BID, each supplement provides 150 kcal and 30 grams of protein.   - If patient consumes all supplements he will meet 50% of kcal needs and 100% of protein needs.   - Calorie count to start tomorrow, 5/19 at breakfast and run until after dinner on 5/20.   - RN to document % intake of all foods, drinks, and supplements patient consumes.   - Multivitamin with minerals daily to support micronutrient needs.   - Monitor weight trends.    NUTRITION DIAGNOSIS:   Increased nutrient needs related to acute illness (acute pancreatitis) as evidenced by estimated needs.  GOAL:   Patient will meet greater than or equal to 90% of their needs  MONITOR:   PO intake, Supplement acceptance, Diet advancement, Weight trends  REASON FOR ASSESSMENT:   Consult Calorie Count  ASSESSMENT:   37 year old male with history of type 2 diabetes on insulin, hypertriglyceridemia, previous episodes of pancreatitis secondary to hypertriglyceridemia, chronic anxiety and depression who presented with 2 days of epigastric pain and back pain. Admitted with acute pancreatitis.  Attempted to reach patient via bedside telephone but no answer.   Per EMR, weight without significant changes over the past year.  However, admitted at 214# (then 219#) on 5/9 and now documented at 208#, which could represent significant weight loss since admission. Patient also noted to be +23L and experiencing edema which could be masking even more weight loss. Will monitor trends.   He is documented to have had 0% of meals on full liquid diet yesterday. No intake recorded today since being advanced to a soft low fat diet.  Consuming Ensure 2-3 times a day. Will decreased Ensure Plus HP to BID for less fat and  add Ensure Max to further support meeting protein needs. If patient consumes all supplements daily he will meet 50% of kcal needs and 100% of protein needs.   Discussed plan to start calorie count with RN. RN to hang envelope on door and document intake.    Medications reviewed and include: Insulin, MVI, Creon (36,000 units TID before meals), Miralax  Labs reviewed:  Na 131 HA1C 10.8 Blood Glucose 128-246 x24 hours   NUTRITION - FOCUSED PHYSICAL EXAM:  RD working remotely, unable to complete at this time  Diet Order:   Diet Order             DIET SOFT Room service appropriate? Yes; Fluid consistency: Thin  Diet effective now                   EDUCATION NEEDS:  Not appropriate for education at this time  Skin:  Skin Assessment: Reviewed RN Assessment  Last BM:  5/17  Height:  Ht Readings from Last 1 Encounters:  01/31/23 5\' 4"  (1.626 m)   Weight:  Wt Readings from Last 1 Encounters:  02/09/23 94.4 kg    BMI:  Body mass index is 35.72 kg/m.  Estimated Nutritional Needs:  Kcal:  2000-2300 kcals Protein:  100-130 grams Fluid:  >/= 2L    Shelle Iron RD, LDN For contact information, refer to [facility].

## 2023-02-09 NOTE — Progress Notes (Addendum)
HISTORY OF PRESENT ILLNESS:  George Howe is a 37 y.o. male who was admitted 9 days ago with acute pancreatitis.  Has evidence of necrosis on imaging.  Had fevers 2 days ago for which his antibiotics were adjusted and currently is on meropenem and Flagyl.  His 2 brothers are at the bedside, 1 of whom is a Advice worker with [employer].  Also, patient's hospitalist, Dr. Lowell Guitar is present.  Patient chief complaint is abdominal pain.  He is ambulating back and forth to the bathroom.  Having trouble with deep breathing due to pain.  No fever overnight.  No other complaints.  We talked about enteral feedings yesterday.  The patient feels like he can tolerate diet.  Thus, no nasogastric feeding tube placed.  White blood cell count 15 (16.1 yesterday). Liver tests 83/76/97/10.0  Imaging: 1.  MRI/MRCP 02/07/2023 2.  Ultrasound 02/06/2023 3.  CT of the abdomen pelvis with contrast 01/31/2023  REVIEW OF SYSTEMS:  All non-GI ROS negative. Past Medical History:  Diagnosis Date   Chicken pox    Depression    Diabetes mellitus without complication (HCC)    DM (diabetes mellitus) (HCC)    Hay fever    Hyperlipidemia    Hyperlipidemia    Morbid obesity (HCC)    PONV (postoperative nausea and vomiting)     Past Surgical History:  Procedure Laterality Date   CARPAL TUNNEL RELEASE Right 10/18/2021   Procedure: Right CARPAL TUNNEL RELEASE;  Surgeon: Marlyne Beards, MD;  Location: [facility];  Service: Orthopedics;  Laterality: Right;   CARPAL TUNNEL RELEASE Left 11/01/2021   Procedure: Left CARPAL TUNNEL RELEASE;  Surgeon: Marlyne Beards, MD;  Location: [facility] OR;  Service: Orthopedics;  Laterality: Left;    Social History Branko Allor  reports that he has been smoking cigarettes. He has a 17.00 pack-year smoking history. He has never used smokeless tobacco. He reports current alcohol use. He reports current drug use. Drug: Marijuana.  family history includes Diabetes  in his father and paternal grandmother.  No Known Allergies     PHYSICAL EXAMINATION: Vital signs: BP 134/89 (BP Location: Right Arm)   Pulse 86   Temp 98.2 F (36.8 C) (Oral)   Resp (!) 22   Ht 5\' 4"  (1.626 m)   Wt 94.4 kg   SpO2 98%   BMI 35.72 kg/m   Constitutional: generally well-appearing, no acute distress Psychiatric: alert and oriented x3, cooperative Eyes: extraocular movements intact, anicteric, conjunctiva pink Mouth: oral pharynx moist, no lesions Neck: supple no lymphadenopathy Cardiovascular: heart regular rate and rhythm, no murmur Lungs: clear to auscultation bilaterally Abdomen: soft, moderate tenderness to palpation in the epigastric region and mid abdomen, nondistended, no obvious ascites, no peritoneal signs, normal bowel sounds, no organomegaly Rectal: Omitted Extremities: no clubbing, cyanosis, or lower extremity edema bilaterally Skin: no lesions on visible extremities Neuro: No focal deficits.  Cranial nerves intact  ASSESSMENT:  1.  Acute pancreatitis with evidence of necrosis.  I suspect the etiology is biliary.  I believe that his elevated triglycerides are secondary to pancreatitis rather than pancreatitis secondary to elevated triglycerides.  Stable.  On meropenem and Flagyl. 2.  Pain secondary to pancreatitis 3.  Liver tests secondary to pancreatitis effect on the biliary tree. 4.  Nutrition.  The patient feels he can take adequate p.o. 5.  DVT prophylaxis.  Being provided.   PLAN:  1.  Continue antibiotics 2.  Pain management 3.  Incentive spirometry 4.  Continue to trend  labs and clinical condition. 5.  No need to continue to check triglyceride levels or lipase.  Not helpful at this point. 6.  Trial of low-fat diet.  Calorie counts. All the above discussed with patient, the attending physician, and his 2 brothers.  All questions answered to the parties satisfaction.  Will follow.  Wilhemina Bonito. Eda Keys., M.D. [facility] Division  of Gastroenterology

## 2023-02-10 ENCOUNTER — Inpatient Hospital Stay (HOSPITAL_COMMUNITY): Payer: 59

## 2023-02-10 DIAGNOSIS — E119 Type 2 diabetes mellitus without complications: Secondary | ICD-10-CM | POA: Diagnosis not present

## 2023-02-10 DIAGNOSIS — D72829 Elevated white blood cell count, unspecified: Secondary | ICD-10-CM | POA: Diagnosis not present

## 2023-02-10 DIAGNOSIS — R7989 Other specified abnormal findings of blood chemistry: Secondary | ICD-10-CM

## 2023-02-10 DIAGNOSIS — K8591 Acute pancreatitis with uninfected necrosis, unspecified: Secondary | ICD-10-CM | POA: Diagnosis not present

## 2023-02-10 DIAGNOSIS — Z794 Long term (current) use of insulin: Secondary | ICD-10-CM | POA: Diagnosis not present

## 2023-02-10 DIAGNOSIS — R6339 Other feeding difficulties: Secondary | ICD-10-CM

## 2023-02-10 DIAGNOSIS — K859 Acute pancreatitis without necrosis or infection, unspecified: Secondary | ICD-10-CM | POA: Diagnosis not present

## 2023-02-10 LAB — CBC WITH DIFFERENTIAL/PLATELET
Abs Immature Granulocytes: 0.13 10*3/uL — ABNORMAL HIGH (ref 0.00–0.07)
Basophils Absolute: 0 10*3/uL (ref 0.0–0.1)
Basophils Relative: 0 %
Eosinophils Absolute: 0.1 10*3/uL (ref 0.0–0.5)
Eosinophils Relative: 1 %
HCT: 37.2 % — ABNORMAL LOW (ref 39.0–52.0)
Hemoglobin: 12.4 g/dL — ABNORMAL LOW (ref 13.0–17.0)
Immature Granulocytes: 1 %
Lymphocytes Relative: 10 %
Lymphs Abs: 1.3 10*3/uL (ref 0.7–4.0)
MCH: 28.4 pg (ref 26.0–34.0)
MCHC: 33.3 g/dL (ref 30.0–36.0)
MCV: 85.3 fL (ref 80.0–100.0)
Monocytes Absolute: 1.2 10*3/uL — ABNORMAL HIGH (ref 0.1–1.0)
Monocytes Relative: 9 %
Neutro Abs: 10.8 10*3/uL — ABNORMAL HIGH (ref 1.7–7.7)
Neutrophils Relative %: 79 %
Platelets: 246 10*3/uL (ref 150–400)
RBC: 4.36 MIL/uL (ref 4.22–5.81)
RDW: 13.9 % (ref 11.5–15.5)
WBC: 13.5 10*3/uL — ABNORMAL HIGH (ref 4.0–10.5)
nRBC: 0 % (ref 0.0–0.2)

## 2023-02-10 LAB — GLUCOSE, CAPILLARY
Glucose-Capillary: 146 mg/dL — ABNORMAL HIGH (ref 70–99)
Glucose-Capillary: 148 mg/dL — ABNORMAL HIGH (ref 70–99)
Glucose-Capillary: 162 mg/dL — ABNORMAL HIGH (ref 70–99)
Glucose-Capillary: 185 mg/dL — ABNORMAL HIGH (ref 70–99)
Glucose-Capillary: 192 mg/dL — ABNORMAL HIGH (ref 70–99)
Glucose-Capillary: 98 mg/dL (ref 70–99)

## 2023-02-10 LAB — COMPREHENSIVE METABOLIC PANEL
ALT: 76 U/L — ABNORMAL HIGH (ref 0–44)
AST: 85 U/L — ABNORMAL HIGH (ref 15–41)
Albumin: 2.3 g/dL — ABNORMAL LOW (ref 3.5–5.0)
Alkaline Phosphatase: 112 U/L (ref 38–126)
Anion gap: 9 (ref 5–15)
BUN: 8 mg/dL (ref 6–20)
CO2: 26 mmol/L (ref 22–32)
Calcium: 7.9 mg/dL — ABNORMAL LOW (ref 8.9–10.3)
Chloride: 96 mmol/L — ABNORMAL LOW (ref 98–111)
Creatinine, Ser: 0.58 mg/dL — ABNORMAL LOW (ref 0.61–1.24)
GFR, Estimated: >60 mL/min (ref >60)
Glucose, Bld: 199 mg/dL — ABNORMAL HIGH (ref 70–99)
Potassium: 3.9 mmol/L (ref 3.5–5.1)
Sodium: 131 mmol/L — ABNORMAL LOW (ref 135–145)
Total Bilirubin: 12.6 mg/dL — ABNORMAL HIGH (ref 0.3–1.2)
Total Protein: 6.1 g/dL — ABNORMAL LOW (ref 6.5–8.1)

## 2023-02-10 LAB — LIPASE, BLOOD: Lipase: 91 U/L — ABNORMAL HIGH (ref 11–51)

## 2023-02-10 LAB — MAGNESIUM: Magnesium: 1.9 mg/dL (ref 1.7–2.4)

## 2023-02-10 LAB — TRIGLYCERIDES: Triglycerides: 176 mg/dL — ABNORMAL HIGH (ref <150)

## 2023-02-10 LAB — PHOSPHORUS: Phosphorus: 2.9 mg/dL (ref 2.5–4.6)

## 2023-02-10 LAB — CULTURE, BLOOD (ROUTINE X 2)

## 2023-02-10 MED ORDER — OXYCODONE HCL 5 MG PO TABS
15.0000 mg | ORAL_TABLET | ORAL | Status: DC | PRN
  Administered 2023-02-10 (×2): 10 mg via ORAL
  Administered 2023-02-11 – 2023-02-15 (×11): 15 mg via ORAL
  Filled 2023-02-10 (×16): qty 3

## 2023-02-10 MED ORDER — IOHEXOL 9 MG/ML PO SOLN
1000.0000 mL | ORAL | Status: AC
  Administered 2023-02-10: 1000 mL via ORAL

## 2023-02-10 MED ORDER — OXYCODONE HCL 5 MG PO TABS
7.5000 mg | ORAL_TABLET | ORAL | Status: DC | PRN
  Administered 2023-02-11 – 2023-02-14 (×3): 7.5 mg via ORAL
  Filled 2023-02-10 (×3): qty 2

## 2023-02-10 MED ORDER — IOHEXOL 300 MG/ML  SOLN
100.0000 mL | Freq: Once | INTRAMUSCULAR | Status: AC | PRN
  Administered 2023-02-10: 100 mL via INTRAVENOUS

## 2023-02-10 MED ORDER — SODIUM CHLORIDE (PF) 0.9 % IJ SOLN
INTRAMUSCULAR | Status: AC
  Filled 2023-02-10: qty 50

## 2023-02-10 MED ORDER — IOHEXOL 9 MG/ML PO SOLN
ORAL | Status: AC
  Filled 2023-02-10: qty 1000

## 2023-02-10 NOTE — Progress Notes (Signed)
HISTORY OF PRESENT ILLNESS:  George Howe is a 37 y.o. male admitted with acute pancreatitis.  Felt to be secondary to markedly elevated triglycerides.  Has evidence of necrosis on advanced imaging studies.  Placed on meropenem and Flagyl due to leukocytosis and fevers 1 week into his hospitalization.  Has had ongoing problems with significant abdominal pain.  Seen yesterday.  See my note.  Patient did experience worsening abdominal pain after eating earlier today.  Calorie counts underway.  He still may need enteral feedings, we will see.  Tmax overnight 100.  White blood cell count improved to 13.5.  Liver tests 85/76/112/12.6.  Primary service is planning repeat CT today  REVIEW OF SYSTEMS:  All non-GI ROS negative  Past Medical History:  Diagnosis Date   Chicken pox    Depression    Diabetes mellitus without complication (HCC)    DM (diabetes mellitus) (HCC)    Hay fever    Hyperlipidemia    Hyperlipidemia    Morbid obesity (HCC)    PONV (postoperative nausea and vomiting)     Past Surgical History:  Procedure Laterality Date   CARPAL TUNNEL RELEASE Right 10/18/2021   Procedure: Right CARPAL TUNNEL RELEASE;  Surgeon: Marlyne Beards, MD;  Location: [facility];  Service: Orthopedics;  Laterality: Right;   CARPAL TUNNEL RELEASE Left 11/01/2021   Procedure: Left CARPAL TUNNEL RELEASE;  Surgeon: Marlyne Beards, MD;  Location: [facility];  Service: Orthopedics;  Laterality: Left;    Social History George Howe  reports that he has been smoking cigarettes. He has a 17.00 pack-year smoking history. He has never used smokeless tobacco. He reports current alcohol use. He reports current drug use. Drug: Marijuana.  family history includes Diabetes in his father and paternal grandmother.  No Known Allergies     PHYSICAL EXAMINATION: Vital signs: BP (!) 128/93 (BP Location: Right Arm)   Pulse 97   Temp 98.3 F (36.8 C) (Oral)   Resp 19   Ht 5\' 4"  (1.626 m)   Wt  95 kg   SpO2 98%   BMI 35.95 kg/m   Constitutional: A bit uncomfortable appearing, no acute distress Psychiatric: alert and oriented x3, cooperative Eyes: extraocular movements intact, anicteric, conjunctiva pink Mouth: oral pharynx moist, no lesions Neck: supple no lymphadenopathy Cardiovascular: heart regular rate and rhythm, no murmur Lungs: clear to auscultation bilaterally Abdomen: soft, moderately severe tenderness in the upper mid abdomen to palpation, nondistended, no obvious ascites, no peritoneal signs, normal bowel sounds, no organomegaly Rectal: Omitted Extremities: no clubbing, cyanosis, or lower extremity edema bilaterally Skin: no lesions on visible extremities Neuro: No focal deficits.  Cranial nerves intact  ASSESSMENT:   1.  Acute pancreatitis with evidence of necrosis.  I suspect the etiology is biliary.  I believe that his elevated triglycerides are secondary to pancreatitis rather than pancreatitis secondary to elevated triglycerides.  Stable.  On meropenem and Flagyl. 2.  Pain secondary to pancreatitis 3.  Liver tests secondary to pancreatitis effect on the biliary tree.  So-called "groove pancreatitis". 4.  Nutrition.  The patient feels he can take adequate p.o..  He had pain today after breakfast.  We will see how he does. 5.  DVT prophylaxis.  Being provided. 6.  Awaiting incentive spirometry     PLAN:   1.  Continue antibiotics 2.  Pain management 3.  Incentive spirometry pending 4.  Continue to trend labs and clinical condition. 5.  No need to continue to check triglyceride levels or lipase.  Not helpful at this point. 6.  Trial of low-fat diet.  Calorie counts underwent. 7.  CT scan ordered GI will continue to follow  Wilhemina Bonito. Eda Keys., M.D. [facility] Division of Gastroenterology

## 2023-02-10 NOTE — Progress Notes (Signed)
PROGRESS NOTE    George Howe  ZOX:[id number] DOB: 07/22/1986 DOA: 01/31/2023 PCP: [name], [name], MD  Chief Complaint  Patient presents with   Abdominal Pain    Brief Narrative:   37 year old with history of type 2 diabetes on insulin, hypertriglyceridemia on Lipitor, previous episodes of pancreatitis secondary to hypertriglyceridemia, chronic anxiety and depression presented with 2 days of epigastric pain and back pain. In the emergency room triglycerides more than 5000, lipase 653, serum glucose 313 and anion gap of 16. Clinically diagnosed as pancreatitis, started on IV fluids. CT scan showed moderate peripancreatic fat stranding consistent with acute pancreatitis and suspicious for necrosis at the pancreatic tail. Hepatic steatosis. Admitted with acute pancreatitis.   Assessment & Plan:   Active Problems:   Acute pancreatitis   Pancreatic necrosis   Abnormal CT of the abdomen   Abdominal pain, epigastric  Acute pancreatitis secondary to hypertriglyceridemia  Concern for Necrotizing Pancreatitis  Fever:  CT with moderate peripancreatic fat stranding c/w acute pancreatitis and focal hypoattenuation of the pancreatic tail concerning for necrosis  MRCP with extensive pancreatic inflammation and acute peripancreatic fluid collections, nonenhancing collections with proteinaceous and blood products in the pancreatic tail and separately in the pancreatic head concerning for local pancreatic necrosis --- splenic vein not thought to be completely occluded, but attenuated, small gallstones, small bilateral effusions (see report) Blood cultures NGx2 Currently on soft diet, low fat, ADAT Continue IVF, IV, PO pain meds prn  Strict I/O (net positive 23 L), daily weights (overall weight down from admission) CT abdomen pelvis with contrast due to lack of improvement, continued pain Appreciate GI recs -> appreciate recs Needs lap chole long term  Hyperbilirubinemia  Elevated Liver  Enzymes Rising today again (will continue to monitor) CT 5/9 with layering hyperdensity in gallbladder neck RUQ [place] 5/15 with diffuse hepatic steatosis, 3 nonmobile echogenic foci (small polyps or stones) MRCP with tapering of distal CBD in vicinity of pancreatic head related to extrinsic narrowing due to pancreatitis GI consulted, appreciate recs  Repeat CT scan today Hold statin, will stop fibrate (suspect elevated bili/LFT's due to pancreatitis, but will hold fibrate as can also cause elevated bili/LFTs)  Hypertriglyceridemia  Dyslipidemia Improved after insulin gtt Will continue to monitor on fenofibrate Hold statin with elevated LFT's Needs lipid clinic at follow up   Attenuation of Splenic Vein Discussed with radiology, low suspicion for thrombus, suspects this is due to inflammation/narrowing  Insulin-dependent type 2 diabetes, hypoglycemia: Blood sugars are acceptable now. long-acting insulin, 5 units twice daily, continue sliding scale insulin.   Chronic anxiety depression: Currently on Wellbutrin.  Will continue.   Smoker: Counseled to quit.  Provide nicotine patch.   Hypocalcemia improved   Hypophosphatemia: follow   Hypomagnesemia: follow   Hypokalemia: follow   Hyponatremia: mild, follow     DVT prophylaxis: lovenox Code Status: full Family Communication: none Disposition:   Status is: Inpatient Remains inpatient appropriate because: need for continued inpatient care   Consultants:  GI  Procedures:  none  Antimicrobials:  Anti-infectives (From admission, onward)    Start     Dose/Rate Route Frequency Ordered Stop   02/08/23 2200  metroNIDAZOLE (FLAGYL) IVPB 500 mg        500 mg 100 mL/hr over 60 Minutes Intravenous Every 8 hours 02/08/23 1324 02/18/23 2359   02/08/23 1200  meropenem (MERREM) 1 g in sodium chloride 0.9 % 100 mL IVPB        1 g 200 mL/hr over 30 Minutes  Intravenous Every 8 hours 02/08/23 1105     02/08/23 1000   piperacillin-tazobactam (ZOSYN) IVPB 3.375 g  Status:  Discontinued        3.375 g 12.5 mL/hr over 240 Minutes Intravenous Every 8 hours 02/08/23 0900 02/08/23 1105   02/01/23 1300  cefTRIAXone (ROCEPHIN) 2 g in sodium chloride 0.9 % 100 mL IVPB  Status:  Discontinued        2 g 200 mL/hr over 30 Minutes Intravenous Every 24 hours 01/31/23 1607 02/01/23 0748   01/31/23 1215  cefTRIAXone (ROCEPHIN) 2 g in sodium chloride 0.9 % 100 mL IVPB        2 g 200 mL/hr over 30 Minutes Intravenous  Once 01/31/23 1203 01/31/23 1313   01/31/23 1215  metroNIDAZOLE (FLAGYL) IVPB 500 mg  Status:  Discontinued        500 mg 100 mL/hr over 60 Minutes Intravenous Every 8 hours 01/31/23 1203 02/01/23 0748       Subjective: Just ate all of breakfast without issue Thinks he's having more pain after this now  Objective: Vitals:   02/09/23 1353 02/09/23 2016 02/10/23 0405 02/10/23 0500  BP: (!) 139/91 (!) 137/97 (!) 128/93   Pulse: (!) 107 (!) 106 97   Resp: 18 20 19    Temp: 99.6 F (37.6 C) 100 F (37.8 C) 98.3 F (36.8 C)   TempSrc: Oral Oral Oral   SpO2: 97% 98% 98%   Weight:    95 kg  Height:        Intake/Output Summary (Last 24 hours) at 02/10/2023 1005 Last data filed at 02/10/2023 0830 Gross per 24 hour  Intake 2290 ml  Output 2551 ml  Net -261 ml   Filed Weights   01/31/23 1500 02/09/23 0447 02/10/23 0500  Weight: 99.6 kg 94.4 kg 95 kg    Examination:  General: No acute distress. Cardiovascular: RRR Lungs: unlabored Abdomen: continued abdominal TTP, diffuse, most in LUQ Neurological: Alert and oriented 3. Moves all extremities 4 with equal strength. Cranial nerves II through XII grossly intact. Extremities: No clubbing or cyanosis. No edema.  Data Reviewed: I have personally reviewed following labs and imaging studies  CBC: Recent Labs  Lab 02/06/23 0420 02/07/23 0417 02/08/23 0421 02/09/23 0453 02/10/23 0350  WBC 14.9* 15.4* 16.1* 15.0* 13.5*  NEUTROABS 11.7*  12.4* 13.0* 12.0* 10.8*  HGB 13.3 12.6* 12.8* 12.6* 12.4*  HCT 40.0 38.3* 39.5 37.6* 37.2*  MCV 84.4 84.5 85.5 85.1 85.3  PLT 170 193 208 211 246    Basic Metabolic Panel: Recent Labs  Lab 02/06/23 0420 02/07/23 0417 02/08/23 0421 02/08/23 0909 02/08/23 1552 02/09/23 0453 02/10/23 0350  NA 132* 130*  --  128* 131* 131* 131*  K 3.6 3.5  --  4.0 3.9 3.9 3.9  CL 94* 96*  --  94* 94* 96* 96*  CO2 26 25  --  23 29 25 26   GLUCOSE 110* 136*  --  215* 148* 148* 199*  BUN 11 13  --  11 10 11 8   CREATININE 0.87 0.68  --  0.64 0.64 0.71 0.58*  CALCIUM 8.3* 8.1*  --  8.1* 8.3* 8.1* 7.9*  MG 2.0 2.1 2.0  --   --  1.9 1.9  PHOS 3.8 4.0 4.3  --   --  3.7 2.9    GFR: Estimated Creatinine Clearance: 131.4 mL/min (Neddie Steedman) (by C-G formula based on SCr of 0.58 mg/dL (L)).  Liver Function Tests: Recent Labs  Lab 02/06/23 0420 02/07/23  1610 02/08/23 0909 02/09/23 0453 02/10/23 0350  AST 115* 114* 100* 83* 85*  ALT 69* 79* 84* 76* 76*  ALKPHOS 84 119 117 97 112  BILITOT 6.2*  6.2* 6.6* 8.9* 10.0* 12.6*  PROT 6.3* 6.4* 6.5 6.2* 6.1*  ALBUMIN 2.5* 2.4* 2.7* 2.3* 2.3*    CBG: Recent Labs  Lab 02/09/23 1524 02/09/23 2010 02/09/23 2356 02/10/23 0403 02/10/23 0758  GLUCAP 223* 184* 271* 185* 146*     Recent Results (from the past 240 hour(s))  MRSA Next Gen by PCR, Nasal     Status: None   Collection Time: 01/31/23  3:17 PM   Specimen: Nasal Mucosa; Nasal Swab  Result Value Ref Range Status   MRSA by PCR Next Gen NOT DETECTED NOT DETECTED Final    Comment: (NOTE) The GeneXpert MRSA Assay (FDA approved for NASAL specimens only), is one component of [employer] comprehensive MRSA colonization surveillance program. It is not intended to diagnose MRSA infection nor to guide or monitor treatment for MRSA infections. Test performance is not FDA approved in patients less than 43 years old. Performed at [facility], [address], [place], [place] [address]   Culture,  blood (Routine X 2) w Reflex to ID Panel     Status: None   Collection Time: 01/31/23  4:20 PM   Specimen: BLOOD RIGHT ARM  Result Value Ref Range Status   Specimen Description   Final    BLOOD RIGHT ARM Performed at [facility], [address] [place], [place] [address]    Special Requests   Final    BOTTLES DRAWN AEROBIC ONLY Blood Culture adequate volume Performed at [facility], [address] [place], [place] [address]    Culture   Final    NO GROWTH 5 DAYS Performed at [facility], [address], [place], [place] [address]    Report Status 02/05/2023 FINAL  Final  Culture, blood (Routine X 2) w Reflex to ID Panel     Status: None   Collection Time: 01/31/23  4:20 PM   Specimen: BLOOD RIGHT HAND  Result Value Ref Range Status   Specimen Description   Final    BLOOD RIGHT HAND Performed at [facility], [address], [place], [place] [address]    Special Requests   Final    BOTTLES DRAWN AEROBIC ONLY Blood Culture adequate volume Performed at [facility], [address], [place], [place] [address]    Culture   Final    NO GROWTH 5 DAYS Performed at [facility], [address], [place], [place] [address]    Report Status 02/05/2023 FINAL  Final  Culture, blood (Routine X 2) w Reflex to ID Panel     Status: None (Preliminary result)   Collection Time: 02/08/23  9:01 AM   Specimen: BLOOD  Result Value Ref Range Status   Specimen Description   Final    BLOOD BLOOD RIGHT ARM Performed at [facility], [address], [place], [place] [address]    Special Requests   Final    BOTTLES DRAWN AEROBIC AND ANAEROBIC Blood Culture adequate volume Performed at [facility], [address], [place], [place] [address]    Culture   Final    NO GROWTH 2 DAYS Performed at [facility], [address], [place], [place] [address]    Report Status PENDING  Incomplete   Culture,  blood (Routine X 2) w Reflex to ID Panel     Status: None (Preliminary result)   Collection Time: 02/08/23  9:09 AM   Specimen: BLOOD  Result Value Ref Range Status   Specimen Description   Final    BLOOD BLOOD RIGHT ARM Performed at [facility], [address], [place], [place] [address]    Special Requests   Final    BOTTLES DRAWN AEROBIC AND ANAEROBIC Blood Culture adequate volume Performed at [facility], [address], [place], [place] [address]    Culture   Final    NO GROWTH 2 DAYS Performed at [facility], [address], [place], [place] [address]    Report Status PENDING  Incomplete         Radiology Studies: DG Chest Port 1 View  Result Date: 02/08/2023 CLINICAL DATA:  Pleural effusion. EXAM: PORTABLE CHEST 1 VIEW COMPARISON:  05/02/2021 FINDINGS: Cardiac and mediastinal contours are within normal limits. No focal pulmonary opacity. No pleural effusion or pneumothorax. No acute osseous abnormality. IMPRESSION: No acute cardiopulmonary process. Electronically Signed   By: Wiliam Ke M.D.   On: 02/08/2023 15:21        Scheduled Meds:  buPROPion  150 mg Oral Daily   enoxaparin (LOVENOX) injection  40 mg Subcutaneous Q24H   feeding supplement  237 mL Oral BID BM   fenofibrate  160 mg Oral Daily   insulin aspart  0-9 Units Subcutaneous Q4H   insulin detemir  5 Units Subcutaneous BID   lipase/protease/amylase  36,000 Units Oral TID AC   multivitamin with minerals  1 tablet Oral Daily   nicotine  14 mg Transdermal Daily   pantoprazole  40 mg Oral Daily   polyethylene glycol  17 g Oral Daily   Ensure Max Protein  11 oz Oral BID   Continuous Infusions:  lactated ringers 125 mL/hr at 02/10/23 0650   meropenem (MERREM) IV 1 g (02/10/23 0433)   metronidazole 500 mg (02/10/23 0546)     LOS: 10 days    Time spent: over 30 min    Lacretia Nicks, MD [facility]   To contact the attending  provider between 7A-7P or the covering provider during after hours 7P-7A, please log into the web site [email or web address] and access using universal [place] password for that web site. If you do not have the password, please call the hospital operator.  02/10/2023, 10:05 AM

## 2023-02-11 DIAGNOSIS — K859 Acute pancreatitis without necrosis or infection, unspecified: Secondary | ICD-10-CM | POA: Diagnosis not present

## 2023-02-11 DIAGNOSIS — K8591 Acute pancreatitis with uninfected necrosis, unspecified: Secondary | ICD-10-CM | POA: Diagnosis not present

## 2023-02-11 DIAGNOSIS — K8512 Biliary acute pancreatitis with infected necrosis: Secondary | ICD-10-CM | POA: Diagnosis not present

## 2023-02-11 DIAGNOSIS — K8020 Calculus of gallbladder without cholecystitis without obstruction: Secondary | ICD-10-CM

## 2023-02-11 LAB — CBC WITH DIFFERENTIAL/PLATELET
Abs Immature Granulocytes: 0.26 10*3/uL — ABNORMAL HIGH (ref 0.00–0.07)
Basophils Absolute: 0.1 10*3/uL (ref 0.0–0.1)
Basophils Relative: 0 %
Eosinophils Absolute: 0.1 10*3/uL (ref 0.0–0.5)
Eosinophils Relative: 0 %
HCT: 38.7 % — ABNORMAL LOW (ref 39.0–52.0)
Hemoglobin: 12.7 g/dL — ABNORMAL LOW (ref 13.0–17.0)
Immature Granulocytes: 1 %
Lymphocytes Relative: 10 %
Lymphs Abs: 1.9 10*3/uL (ref 0.7–4.0)
MCH: 28.1 pg (ref 26.0–34.0)
MCHC: 32.8 g/dL (ref 30.0–36.0)
MCV: 85.6 fL (ref 80.0–100.0)
Monocytes Absolute: 1.4 10*3/uL — ABNORMAL HIGH (ref 0.1–1.0)
Monocytes Relative: 7 %
Neutro Abs: 14.7 10*3/uL — ABNORMAL HIGH (ref 1.7–7.7)
Neutrophils Relative %: 82 %
Platelets: 302 10*3/uL (ref 150–400)
RBC: 4.52 MIL/uL (ref 4.22–5.81)
RDW: 14.2 % (ref 11.5–15.5)
WBC: 18.4 10*3/uL — ABNORMAL HIGH (ref 4.0–10.5)
nRBC: 0 % (ref 0.0–0.2)

## 2023-02-11 LAB — COMPREHENSIVE METABOLIC PANEL
ALT: 66 U/L — ABNORMAL HIGH (ref 0–44)
AST: 62 U/L — ABNORMAL HIGH (ref 15–41)
Albumin: 2.4 g/dL — ABNORMAL LOW (ref 3.5–5.0)
Alkaline Phosphatase: 134 U/L — ABNORMAL HIGH (ref 38–126)
Anion gap: 9 (ref 5–15)
BUN: 12 mg/dL (ref 6–20)
CO2: 29 mmol/L (ref 22–32)
Calcium: 8.2 mg/dL — ABNORMAL LOW (ref 8.9–10.3)
Chloride: 93 mmol/L — ABNORMAL LOW (ref 98–111)
Creatinine, Ser: 0.95 mg/dL (ref 0.61–1.24)
GFR, Estimated: >60 mL/min (ref >60)
Glucose, Bld: 141 mg/dL — ABNORMAL HIGH (ref 70–99)
Potassium: 4.5 mmol/L (ref 3.5–5.1)
Sodium: 131 mmol/L — ABNORMAL LOW (ref 135–145)
Total Bilirubin: 7.8 mg/dL — ABNORMAL HIGH (ref 0.3–1.2)
Total Protein: 6.6 g/dL (ref 6.5–8.1)

## 2023-02-11 LAB — MAGNESIUM: Magnesium: 2.3 mg/dL (ref 1.7–2.4)

## 2023-02-11 LAB — GLUCOSE, CAPILLARY
Glucose-Capillary: 133 mg/dL — ABNORMAL HIGH (ref 70–99)
Glucose-Capillary: 140 mg/dL — ABNORMAL HIGH (ref 70–99)
Glucose-Capillary: 162 mg/dL — ABNORMAL HIGH (ref 70–99)
Glucose-Capillary: 172 mg/dL — ABNORMAL HIGH (ref 70–99)
Glucose-Capillary: 190 mg/dL — ABNORMAL HIGH (ref 70–99)
Glucose-Capillary: 238 mg/dL — ABNORMAL HIGH (ref 70–99)

## 2023-02-11 LAB — CULTURE, BLOOD (ROUTINE X 2)
Culture: NO GROWTH
Culture: NO GROWTH
Special Requests: ADEQUATE

## 2023-02-11 LAB — BRAIN NATRIURETIC PEPTIDE: B Natriuretic Peptide: 35.3 pg/mL (ref 0.0–100.0)

## 2023-02-11 LAB — TRIGLYCERIDES: Triglycerides: 157 mg/dL — ABNORMAL HIGH (ref <150)

## 2023-02-11 LAB — LIPASE, BLOOD: Lipase: 84 U/L — ABNORMAL HIGH (ref 11–51)

## 2023-02-11 LAB — PHOSPHORUS: Phosphorus: 3.5 mg/dL (ref 2.5–4.6)

## 2023-02-11 NOTE — Progress Notes (Addendum)
Progress Note  Primary GI: Unassigned (LB PCP)  LOS: 11 days   Chief Complaint: Pancreatitis   Subjective   Patient states yesterday he had a rough day.  Feels his diet choices were not the best with his present condition as he tried for drinking milk and adding cheese in his omelette.  States this caused him a lot of pain.  States today he is doing better.  Tolerating bland toast and bland eggs.  Pain moderately controlled with Dilaudid, though still present.  Denies nausea/vomiting.   Objective   Vital signs in last 24 hours: Temp:  [98.4 F (36.9 C)-99.4 F (37.4 C)] 98.4 F (36.9 C) (05/20 0412) Pulse Rate:  [98-108] 100 (05/20 0412) Resp:  [16-21] 21 (05/20 0412) BP: (116-141)/(74-88) 127/88 (05/20 0412) SpO2:  [95 %-99 %] 99 % (05/20 0412) Weight:  [94.6 kg] 94.6 kg (05/20 0431) Last BM Date : 02/10/23 Last BM recorded by nurses in past 5 days Stool Type: Type 5 (Soft blobs with clear-cut edges) (02/10/2023  9:00 PM)  General:   male in no acute distress, jaundiced, icteric sclera Heart:  Regular rate and rhythm; no murmurs Pulm: Clear anteriorly; no wheezing Abdomen: Sniffing and generalized tenderness to palpation, soft, normal active bowel sounds. Extremities:  No edema Neurologic:  Alert and  oriented x4;  No focal deficits.  Psych:  Cooperative. Normal mood and affect.  Intake/Output from previous day: 05/19 0701 - 05/20 0700 In: 4952 [P.O.:410; I.V.:3379.6; IV Piggyback:1162.4] Out: 2300 [Urine:2300] Intake/Output this shift: Total I/O In: -  Out: 1800 [Urine:1800]  Studies/Results: CT ABDOMEN PELVIS W CONTRAST  Result Date: 02/11/2023 CLINICAL DATA:  Abdominal pain, acute pancreatitis EXAM: CT ABDOMEN AND PELVIS WITH CONTRAST TECHNIQUE: Multidetector CT imaging of the abdomen and pelvis was performed using the standard protocol following bolus administration of intravenous contrast. RADIATION DOSE REDUCTION: This exam was performed according to the  departmental dose-optimization program which includes automated exposure control, adjustment of the mA and/or kV according to patient size and/or use of iterative reconstruction technique. CONTRAST:  OMNIPAQUE IOHEXOL 300 MG/ML  SOLN COMPARISON:  MRI abdomen dated 02/07/2023. CT abdomen/pelvis dated 01/31/2023. FINDINGS: Lower chest: Trace left pleural effusion with minimal left basilar atelectasis. Hepatobiliary: Liver is within normal limits, noting mild steatosis focal fatty sparing along the gallbladder fossa. Tiny layering gallstones (series 2/image 38), without associated inflammatory changes. No intrahepatic or extrahepatic duct dilatation. Pancreas: Severe acute pancreatitis with suspected progressive parenchymal necrosis along the distal pancreatic tail (series 2/image 36) and anterior pancreatic head/uncinate process (series 2/image 49). Associated progressive acute peripancreatic fluid/inflammatory changes without wall/rim to suggest walled-off necrosis (series 2/image 40). Spleen: Within normal limits, noting trace perisplenic fluid inferiorly (series 2/image 39). Adrenals/Urinary Tract: Adrenal glands are within normal limits. Kidneys are within normal limits.  No hydronephrosis. Bladder is within normal limits. Stomach/Bowel: Mild perigastric fluid and secondary wall thickening due to peripancreatic inflammatory changes. No evidence of bowel obstruction. Normal appendix (series 2/image 7). No colonic wall thickening or inflammatory changes. Vascular/Lymphatic: No evidence of abdominal aortic aneurysm. No suspicious abdominopelvic lymphadenopathy. Reproductive: Prostate is unremarkable. Other: In addition to the peripancreatic fluid, there is mild retroperitoneal/anterior pararenal fluid, left greater than right, and trace presacral fluid. Musculoskeletal: Mild degenerative changes of the lower thoracic spine. IMPRESSION: Severe acute pancreatitis with pancreatic necrosis and acute pancreatic  fluid collections, as described above. Cholelithiasis, without associated inflammatory changes. Trace left pleural effusion with minimal left basilar atelectasis. Electronically Signed   By: Charline Bills  M.D.   On: 02/11/2023 08:11    Lab Results: Recent Labs    02/09/23 0453 02/10/23 0350 02/11/23 0410  WBC 15.0* 13.5* 18.4*  HGB 12.6* 12.4* 12.7*  HCT 37.6* 37.2* 38.7*  PLT 211 246 302   BMET Recent Labs    02/09/23 0453 02/10/23 0350 02/11/23 0410  NA 131* 131* 131*  K 3.9 3.9 4.5  CL 96* 96* 93*  CO2 25 26 29   GLUCOSE 148* 199* 141*  BUN 11 8 12   CREATININE 0.71 0.58* 0.95  CALCIUM 8.1* 7.9* 8.2*   LFT Recent Labs    02/11/23 0410  PROT 6.6  ALBUMIN 2.4*  AST 62*  ALT 66*  ALKPHOS 134*  BILITOT 7.8*   PT/INR No results for input(s): "LABPROT", "INR" in the last 72 hours.   Scheduled Meds:  buPROPion  150 mg Oral Daily   enoxaparin (LOVENOX) injection  40 mg Subcutaneous Q24H   feeding supplement  237 mL Oral BID BM   insulin aspart  0-9 Units Subcutaneous Q4H   insulin detemir  5 Units Subcutaneous BID   lipase/protease/amylase  36,000 Units Oral TID AC   multivitamin with minerals  1 tablet Oral Daily   nicotine  14 mg Transdermal Daily   pantoprazole  40 mg Oral Daily   polyethylene glycol  17 g Oral Daily   Ensure Max Protein  11 oz Oral BID   Continuous Infusions:  lactated ringers 75 mL/hr at 02/10/23 2217   meropenem (MERREM) IV 1 g (02/11/23 0425)   metronidazole 500 mg (02/11/23 1610)      Patient profile:   37 year old male with acute pancreatitis, felt to be secondary to markedly elevated triglycerides.  Has evidence of necrosis on advanced imaging studies.  On meropenem and Flagyl due to leukocytosis and fevers 1 week and his hospitalization.  This is his sixth time with pancreatitis.  MRCP 02/07/2023 shows pancreatic inflammation and fluid collections.  Splenic vein not thought to be completely occluded though does seem to be  attenuated with some collateralization.  Extrinsic narrowing of distal CBD secondary to pancreatitis.   Impression:   Acute necrotizing pancreatitis -AST 62/ALT 66/alk phos 134, improving -Total bilirubin 7.8, improving -Triglycerides 157 -Lipase 84 -Repeat imaging:CT abdomen pelvis with contrast: Severe pancreatitis with progressive necrosis along the pancreatic tail and anterior pancreatic head/uncinate process.  Peripancreatic fluid/inflammatory changes without wall/rim to suggest walled-off necrosis.    Plan:   -Continue meropenem and Flagyl (on day 3), started 5/17 -Overall tolerating diet today, can continue as tolerated. -Continue lactated Ringer's -Continue pain control -Continue close monitoring.  [name]  02/11/2023, 9:42 AM   Attending physician's note   I have taken a history, reviewed the chart and examined the patient. I performed a substantive portion of this encounter, including complete performance of at least one of the key components, in conjunction with the APP. I agree with the APP's note, impression and recommendations.    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 He has gallstones based on imaging, cannot exclude biliary pancreatitis He does not have CBD stones on MRI/ MRCP Abnormal LFT likely sequelae of severe pancreatitis due to compression of CBD in the setting of pancreatic inflammation Will need to consider cholecystectomy once acute pancreatitis improves to prevent a recurrent episode Continue broad-spectrum antibiotics Continue diet as tolerated Pain control  GI will continue to  follow along    The patient was provided an opportunity to ask questions and all were answered. The patient agreed with the plan and demonstrated an understanding of the  instructions.   Iona Beard , MD [phone]

## 2023-02-11 NOTE — Progress Notes (Signed)
Pharmacy Antibiotic Note  George Howe is a 37 y.o. male who presented to the ED on 01/31/2023 with c/o abdominal pain. He was subsequently found to gave acute necrotizing pancreatitis and is currently on meropenem and flagyl.  Today, 02/11/2023: - day #4 abx - wbc up 18.4, Tmax 99.4 - scr 0.95 (crcl ~100)  Plan: - continue meropenem 1gm IV q8h - flagyl 500 mg IV q8h per MD  _________________________________________  Height: 5\' 4"  (162.6 cm) Weight: 94.6 kg (208 lb 8 oz) IBW/kg (Calculated) : 59.2  Temp (24hrs), Avg:99 F (37.2 C), Min:98.4 F (36.9 C), Max:99.4 F (37.4 C)  Recent Labs  Lab 02/07/23 0417 02/08/23 0421 02/08/23 0909 02/08/23 1552 02/09/23 0453 02/10/23 0350 02/11/23 0410  WBC 15.4* 16.1*  --   --  15.0* 13.5* 18.4*  CREATININE 0.68  --  0.64 0.64 0.71 0.58* 0.95    Estimated Creatinine Clearance: 110.5 mL/min (by C-G formula based on SCr of 0.95 mg/dL).    No Known Allergies  Thank you for allowing pharmacy to be a part of this patient's care.  Lucia Gaskins 02/11/2023 7:37 AM

## 2023-02-11 NOTE — Progress Notes (Signed)
Initial Nutrition Assessment  DOCUMENTATION CODES:   Obesity unspecified  INTERVENTION:  - Soft, Low Fat diet per MD.   - Ensure Plus High Protein po BID, each supplement provides 350 kcal and 20 grams of protein. - Ensure Max po BID, each supplement provides 150 kcal and 30 grams of protein.              - If patient consumes all supplements he will meet 50% of kcal needs and 100% of protein needs.    - Calorie count to continue through the end of today, 5/20, after dinner.              - Discussed with RN.  - RN to document % intake of all foods, drinks, and supplements patient consumes.     - Day 1 calorie count results:  - 1843 kcal (92% of minimum estimated needs)  - 99 protein (99% of minimum estimated needs)  - Multivitamin with minerals daily to support micronutrient needs.    - Monitor weight trends.     NUTRITION DIAGNOSIS:   Increased nutrient needs related to acute illness (acute pancreatitis) as evidenced by estimated needs. *ongoing  GOAL:   Patient will meet greater than or equal to 90% of their needs *met so far  MONITOR:   PO intake, Supplement acceptance, Diet advancement, Weight trends  REASON FOR ASSESSMENT:   Consult Diet education  ASSESSMENT:   37 year old male with history of type 2 diabetes on insulin, hypertriglyceridemia, previous episodes of pancreatitis secondary to hypertriglyceridemia, chronic anxiety and depression who presented with 2 days of epigastric pain and back pain. Admitted with acute pancreatitis.  Met with patient at bedside this AM. He reports a UBW of 205-215# and notes his weight was stable until the past few weeks. Since then his weight has fluctuated drastically from week to week. Per EMR, no significant changes in weight over the past year. However, patient admitted at 214# and now weighed at 208#, which could indicate weight loss since admission.   Patient reports eating well at home on a daily basis. Typically 3  meals a day plus snacks. Notes he often meal preps as he has a busy schedule as a Investment banker, operational.  He reports tolerating diet fairly. Was doing well on liquids but once advanced to a soft diet yesterday he had a cheese omelet and milk which caused him almost immediate pain.  Patient inquiring what foods he should eat. Provided patient with diet education handout for pancreatitis nutrition therapy and discussed importance of a low fat diet. Reviewed food groups and low fat food choices in each. Patient endorsed understanding.   He ate 100% of his breakfast yesterday but after developing pain did not eat a lunch or dinner. Thankfully, he has continued to consume nutrition supplements which has aided in him better meeting nutritional needs. Has been drinking Ensure Plus High Protein without issue and notes he won't drink Boost Breeze due to high sugar content. Calorie count results from yesterday as below:    Day 1 (5/19): Breakfast: 100% Lunch: 0% Dinner: 0% Supplements: Ensure Plus High Protein x3 Total intake: 1843 kcal (92% of minimum estimated needs)  99 protein (99% of minimum estimated needs)  Patient reports good appetite today and still in pain but ate 100% of breakfast. He is motivated to eat well to avoid having to get NGT and tube feeds.     Medications reviewed and include: Insulin, Creon (36,000 units TID with meals), MVI, Miralax  Labs  reviewed:  Na 131 HA1C 10.8 Blood Glucose 98-192 x24 hours   NUTRITION - FOCUSED PHYSICAL EXAM:  Flowsheet Row Most Recent Value  Orbital Region No depletion  Upper Arm Region No depletion  Thoracic and Lumbar Region No depletion  Buccal Region No depletion  Temple Region No depletion  Clavicle Bone Region No depletion  Clavicle and Acromion Bone Region No depletion  Scapular Bone Region Unable to assess  Dorsal Hand No depletion  Patellar Region No depletion  Anterior Thigh Region No depletion  Posterior Calf Region No depletion  Edema (RD  Assessment) None  Hair Reviewed  Eyes Reviewed  Mouth Reviewed  Skin Reviewed  Nails Reviewed       Diet Order:   Diet Order             DIET SOFT Room service appropriate? Yes; Fluid consistency: Thin  Diet effective now                   EDUCATION NEEDS:  Not appropriate for education at this time  Skin:  Skin Assessment: Reviewed RN Assessment  Last BM:  5/19  Height:  Ht Readings from Last 1 Encounters:  01/31/23 5\' 4"  (1.626 m)   Weight:  Wt Readings from Last 1 Encounters:  02/11/23 94.6 kg    BMI:  Body mass index is 35.79 kg/m.  Estimated Nutritional Needs:  Kcal:  2000-2300 kcals Protein:  100-130 grams Fluid:  >/= 2L    Shelle Iron RD, LDN For contact information, refer to [facility].

## 2023-02-11 NOTE — Progress Notes (Signed)
PROGRESS NOTE    George Howe  ZOX:[id number] DOB: 1986/01/23 DOA: 01/31/2023 PCP: [name], [name], MD  Chief Complaint  Patient presents with   Abdominal Pain    Brief Narrative:   37 year old with history of type 2 diabetes on insulin, hypertriglyceridemia on Lipitor, previous episodes of pancreatitis secondary to hypertriglyceridemia, chronic anxiety and depression presented with 2 days of epigastric pain and back pain. In the emergency room triglycerides more than 5000, lipase 653, serum glucose 313 and anion gap of 16. Clinically diagnosed as pancreatitis, started on IV fluids. CT scan showed moderate peripancreatic fat stranding consistent with acute pancreatitis and suspicious for necrosis at the pancreatic tail. Hepatic steatosis. Admitted with acute pancreatitis.   Assessment & Plan:   Active Problems:   Acute pancreatitis   Necrotizing pancreatitis   Pancreatic necrosis   Abnormal CT of the abdomen   Abdominal pain, epigastric   Elevated liver function tests   Feeding problems  Acute pancreatitis secondary to hypertriglyceridemia  Concern for Necrotizing Pancreatitis  Fever:  CT with moderate peripancreatic fat stranding c/w acute pancreatitis and focal hypoattenuation of the pancreatic tail concerning for necrosis  MRCP with extensive pancreatic inflammation and acute peripancreatic fluid collections, nonenhancing collections with proteinaceous and blood products in the pancreatic tail and separately in the pancreatic head concerning for local pancreatic necrosis --- splenic vein not thought to be completely occluded, but attenuated, small gallstones, small bilateral effusions (see report) Blood cultures NGx2 Currently on soft diet, low fat, ADAT Continue IVF, IV, PO pain meds prn  Abx per GI Strict I/O (net positive 24 L), daily weights (overall weight down from admission) -> will monitor on LR, not grossly overloaded, continue IVF for now CT abdomen pelvis  5/19 with severe acute pancreatitis with pancreatic necrosis and acute pancreatic fluid collections, cholelithiasis, trace L effusion Appreciate GI recs -> appreciate recs Needs lap chole long term  Hyperbilirubinemia  Elevated Liver Enzymes Some improvement today, will follow  CT 5/9 with layering hyperdensity in gallbladder neck RUQ [place] 5/15 with diffuse hepatic steatosis, 3 nonmobile echogenic foci (small polyps or stones) MRCP with tapering of distal CBD in vicinity of pancreatic head related to extrinsic narrowing due to pancreatitis GI consulted, appreciate recs  Repeat CT scan today Hold statin, will stop fibrate (suspect elevated bili/LFT's due to pancreatitis, but will hold fibrate as can also cause elevated bili/LFTs)  Leukocytosis Due to above  Hypertriglyceridemia  Dyslipidemia Improved after insulin gtt Will continue to monitor on fenofibrate (on hold with elevated LFT's) Hold statin with elevated LFT's Needs lipid clinic at follow up   Attenuation of Splenic Vein Discussed with radiology, low suspicion for thrombus, suspects this is due to inflammation/narrowing  Insulin-dependent type 2 diabetes, hypoglycemia: Blood sugars are acceptable now. long-acting insulin, 5 units twice daily, continue sliding scale insulin.   Chronic anxiety depression: Currently on Wellbutrin.  Will continue. Smoker: Counseled to quit.  Provide nicotine patch. Hypocalcemia resolved, corrects with albumin Hypophosphatemia: resolved  Hypomagnesemia: resolved Hypokalemia: resolved Hyponatremia: mild, follow     DVT prophylaxis: lovenox Code Status: full Family Communication: none Disposition:   Status is: Inpatient Remains inpatient appropriate because: need for continued inpatient care   Consultants:  GI  Procedures:  none  Antimicrobials:  Anti-infectives (From admission, onward)    Start     Dose/Rate Route Frequency Ordered Stop   02/08/23 2200  metroNIDAZOLE (FLAGYL)  IVPB 500 mg        500 mg 100 mL/hr over 60 Minutes  Intravenous Every 8 hours 02/08/23 1324 02/18/23 2359   02/08/23 1200  meropenem (MERREM) 1 g in sodium chloride 0.9 % 100 mL IVPB        1 g 200 mL/hr over 30 Minutes Intravenous Every 8 hours 02/08/23 1105     02/08/23 1000  piperacillin-tazobactam (ZOSYN) IVPB 3.375 g  Status:  Discontinued        3.375 g 12.5 mL/hr over 240 Minutes Intravenous Every 8 hours 02/08/23 0900 02/08/23 1105   02/01/23 1300  cefTRIAXone (ROCEPHIN) 2 g in sodium chloride 0.9 % 100 mL IVPB  Status:  Discontinued        2 g 200 mL/hr over 30 Minutes Intravenous Every 24 hours 01/31/23 1607 02/01/23 0748   01/31/23 1215  cefTRIAXone (ROCEPHIN) 2 g in sodium chloride 0.9 % 100 mL IVPB        2 g 200 mL/hr over 30 Minutes Intravenous  Once 01/31/23 1203 01/31/23 1313   01/31/23 1215  metroNIDAZOLE (FLAGYL) IVPB 500 mg  Status:  Discontinued        500 mg 100 mL/hr over 60 Minutes Intravenous Every 8 hours 01/31/23 1203 02/01/23 0748       Subjective: Ate breakfast, did well this AM Feels slightly better  Objective: Vitals:   02/10/23 1320 02/10/23 2015 02/11/23 0412 02/11/23 0431  BP: (!) 141/87 116/74 127/88   Pulse: 98 (!) 108 100   Resp: 18 16 (!) 21   Temp: 99.4 F (37.4 C) 99.1 F (37.3 C) 98.4 F (36.9 C)   TempSrc: Oral Oral Oral   SpO2: 99% 95% 99%   Weight:    94.6 kg  Height:        Intake/Output Summary (Last 24 hours) at 02/11/2023 1057 Last data filed at 02/11/2023 0900 Gross per 24 hour  Intake 4952.02 ml  Output 3700 ml  Net 1252.02 ml   Filed Weights   02/09/23 0447 02/10/23 0500 02/11/23 0431  Weight: 94.4 kg 95 kg 94.6 kg    Examination:  General: No acute distress. Cardiovascular: RRR Lungs: unlabored Abdomen: diffuse TTP, still significant, but mild improvement noted today on exam Neurological: Alert and oriented 3. Moves all extremities 4 with equal strength. Cranial nerves II through XII grossly  intact. Extremities: No clubbing or cyanosis. No edema.   Data Reviewed: I have personally reviewed following labs and imaging studies  CBC: Recent Labs  Lab 02/07/23 0417 02/08/23 0421 02/09/23 0453 02/10/23 0350 02/11/23 0410  WBC 15.4* 16.1* 15.0* 13.5* 18.4*  NEUTROABS 12.4* 13.0* 12.0* 10.8* 14.7*  HGB 12.6* 12.8* 12.6* 12.4* 12.7*  HCT 38.3* 39.5 37.6* 37.2* 38.7*  MCV 84.5 85.5 85.1 85.3 85.6  PLT 193 208 211 246 302    Basic Metabolic Panel: Recent Labs  Lab 02/07/23 0417 02/08/23 0421 02/08/23 0909 02/08/23 1552 02/09/23 0453 02/10/23 0350 02/11/23 0410  NA 130*  --  128* 131* 131* 131* 131*  K 3.5  --  4.0 3.9 3.9 3.9 4.5  CL 96*  --  94* 94* 96* 96* 93*  CO2 25  --  23 29 25 26 29   GLUCOSE 136*  --  215* 148* 148* 199* 141*  BUN 13  --  11 10 11 8 12   CREATININE 0.68  --  0.64 0.64 0.71 0.58* 0.95  CALCIUM 8.1*  --  8.1* 8.3* 8.1* 7.9* 8.2*  MG 2.1 2.0  --   --  1.9 1.9 2.3  PHOS 4.0 4.3  --   --  3.7 2.9 3.5    GFR: Estimated Creatinine Clearance: 110.5 mL/min (by C-G formula based on SCr of 0.95 mg/dL).  Liver Function Tests: Recent Labs  Lab 02/07/23 0417 02/08/23 0909 02/09/23 0453 02/10/23 0350 02/11/23 0410  AST 114* 100* 83* 85* 62*  ALT 79* 84* 76* 76* 66*  ALKPHOS 119 117 97 112 134*  BILITOT 6.6* 8.9* 10.0* 12.6* 7.8*  PROT 6.4* 6.5 6.2* 6.1* 6.6  ALBUMIN 2.4* 2.7* 2.3* 2.3* 2.4*    CBG: Recent Labs  Lab 02/10/23 1643 02/10/23 1946 02/10/23 2338 02/11/23 0409 02/11/23 0732  GLUCAP 98 148* 162* 140* 133*     Recent Results (from the past 240 hour(s))  Culture, blood (Routine X 2) w Reflex to ID Panel     Status: None (Preliminary result)   Collection Time: 02/08/23  9:01 AM   Specimen: BLOOD  Result Value Ref Range Status   Specimen Description   Final    BLOOD BLOOD RIGHT ARM Performed at [facility], [address] [place], [place] [address]    Special Requests   Final    BOTTLES DRAWN  AEROBIC AND ANAEROBIC Blood Culture adequate volume Performed at [facility], [address] [place], [place] [address]    Culture   Final    NO GROWTH 3 DAYS Performed at [facility], [address] [place], [place] [address]    Report Status PENDING  Incomplete  Culture, blood (Routine X 2) w Reflex to ID Panel     Status: None (Preliminary result)   Collection Time: 02/08/23  9:09 AM   Specimen: BLOOD  Result Value Ref Range Status   Specimen Description   Final    BLOOD BLOOD RIGHT ARM Performed at [facility], [address] [place], [place] [address]    Special Requests   Final    BOTTLES DRAWN AEROBIC AND ANAEROBIC Blood Culture adequate volume Performed at [facility], [address] [place], [place] [address]    Culture   Final    NO GROWTH 3 DAYS Performed at [facility], [address], [place], [place] [address]    Report Status PENDING  Incomplete         Radiology Studies: CT ABDOMEN PELVIS W CONTRAST  Result Date: 02/11/2023 CLINICAL DATA:  Abdominal pain, acute pancreatitis EXAM: CT ABDOMEN AND PELVIS WITH CONTRAST TECHNIQUE: Multidetector CT imaging of the abdomen and pelvis was performed using the standard protocol following bolus administration of intravenous contrast. RADIATION DOSE REDUCTION: This exam was performed according to the departmental dose-optimization program which includes automated exposure control, adjustment of the mA and/or kV according to patient size and/or use of iterative reconstruction technique. CONTRAST:  OMNIPAQUE IOHEXOL 300 MG/ML  SOLN COMPARISON:  MRI abdomen dated 02/07/2023. CT abdomen/pelvis dated 01/31/2023. FINDINGS: Lower chest: Trace left pleural effusion with minimal left basilar atelectasis. Hepatobiliary: Liver is within normal limits, noting mild steatosis focal fatty sparing along the gallbladder fossa. Tiny layering gallstones (series  2/image 38), without associated inflammatory changes. No intrahepatic or extrahepatic duct dilatation. Pancreas: Severe acute pancreatitis with suspected progressive parenchymal necrosis along the distal pancreatic tail (series 2/image 36) and anterior pancreatic head/uncinate process (series 2/image 49). Associated progressive acute peripancreatic fluid/inflammatory changes without wall/rim to suggest walled-off necrosis (series 2/image 40). Spleen: Within normal limits, noting trace perisplenic fluid inferiorly (series 2/image 39). Adrenals/Urinary Tract: Adrenal glands are within normal limits. Kidneys are within normal limits.  No hydronephrosis. Bladder is within normal limits. Stomach/Bowel: Mild perigastric fluid and secondary wall thickening due to peripancreatic inflammatory changes. No evidence of bowel obstruction. Normal appendix (series 2/image 7). No colonic wall thickening or inflammatory changes. Vascular/Lymphatic: No evidence of abdominal aortic aneurysm. No suspicious abdominopelvic lymphadenopathy. Reproductive: Prostate is unremarkable. Other: In addition to the peripancreatic fluid, there is mild retroperitoneal/anterior pararenal fluid, left greater than right, and trace presacral fluid. Musculoskeletal: Mild degenerative changes of the lower thoracic spine. IMPRESSION: Severe acute pancreatitis with pancreatic necrosis and acute pancreatic fluid collections, as described above. Cholelithiasis, without associated inflammatory changes. Trace left pleural effusion with minimal left basilar atelectasis. Electronically Signed   By: Charline Bills M.D.   On: 02/11/2023 08:11        Scheduled Meds:  buPROPion  150 mg Oral Daily   enoxaparin (LOVENOX) injection  40 mg Subcutaneous Q24H   feeding supplement  237 mL Oral BID BM   insulin aspart  0-9 Units Subcutaneous Q4H   insulin detemir  5 Units Subcutaneous BID   lipase/protease/amylase  36,000 Units Oral TID AC   multivitamin with  minerals  1 tablet Oral Daily   nicotine  14 mg Transdermal Daily   pantoprazole  40 mg Oral Daily   polyethylene glycol  17 g Oral Daily   Ensure Max Protein  11 oz Oral BID   Continuous Infusions:  lactated ringers 75 mL/hr at 02/10/23 2217   meropenem (MERREM) IV 1 g (02/11/23 0425)   metronidazole 500 mg (02/11/23 0621)     LOS: 11 days    Time spent: over 30 min    Lacretia Nicks, MD [facility]   To contact the attending provider between 7A-7P or the covering provider during after hours 7P-7A, please log into the web site [email or web address] and access using universal [place] password for that web site. If you do not have the password, please call the hospital operator.  02/11/2023, 10:57 AM

## 2023-02-12 DIAGNOSIS — K8591 Acute pancreatitis with uninfected necrosis, unspecified: Secondary | ICD-10-CM | POA: Diagnosis not present

## 2023-02-12 LAB — CBC WITH DIFFERENTIAL/PLATELET
Abs Immature Granulocytes: 0.22 10*3/uL — ABNORMAL HIGH (ref 0.00–0.07)
Basophils Absolute: 0 10*3/uL (ref 0.0–0.1)
Basophils Relative: 0 %
Eosinophils Absolute: 0.1 10*3/uL (ref 0.0–0.5)
Eosinophils Relative: 1 %
HCT: 34.7 % — ABNORMAL LOW (ref 39.0–52.0)
Hemoglobin: 11.4 g/dL — ABNORMAL LOW (ref 13.0–17.0)
Immature Granulocytes: 2 %
Lymphocytes Relative: 14 %
Lymphs Abs: 1.9 10*3/uL (ref 0.7–4.0)
MCH: 28 pg (ref 26.0–34.0)
MCHC: 32.9 g/dL (ref 30.0–36.0)
MCV: 85.3 fL (ref 80.0–100.0)
Monocytes Absolute: 1.2 10*3/uL — ABNORMAL HIGH (ref 0.1–1.0)
Monocytes Relative: 9 %
Neutro Abs: 10.4 10*3/uL — ABNORMAL HIGH (ref 1.7–7.7)
Neutrophils Relative %: 74 %
Platelets: 307 10*3/uL (ref 150–400)
RBC: 4.07 MIL/uL — ABNORMAL LOW (ref 4.22–5.81)
RDW: 14.2 % (ref 11.5–15.5)
WBC: 13.9 10*3/uL — ABNORMAL HIGH (ref 4.0–10.5)
nRBC: 0 % (ref 0.0–0.2)

## 2023-02-12 LAB — COMPREHENSIVE METABOLIC PANEL
ALT: 54 U/L — ABNORMAL HIGH (ref 0–44)
AST: 60 U/L — ABNORMAL HIGH (ref 15–41)
Albumin: 2.2 g/dL — ABNORMAL LOW (ref 3.5–5.0)
Alkaline Phosphatase: 129 U/L — ABNORMAL HIGH (ref 38–126)
Anion gap: 8 (ref 5–15)
BUN: 12 mg/dL (ref 6–20)
CO2: 28 mmol/L (ref 22–32)
Calcium: 8 mg/dL — ABNORMAL LOW (ref 8.9–10.3)
Chloride: 95 mmol/L — ABNORMAL LOW (ref 98–111)
Creatinine, Ser: 0.79 mg/dL (ref 0.61–1.24)
GFR, Estimated: >60 mL/min (ref >60)
Glucose, Bld: 132 mg/dL — ABNORMAL HIGH (ref 70–99)
Potassium: 4 mmol/L (ref 3.5–5.1)
Sodium: 131 mmol/L — ABNORMAL LOW (ref 135–145)
Total Bilirubin: 6.2 mg/dL — ABNORMAL HIGH (ref 0.3–1.2)
Total Protein: 6.1 g/dL — ABNORMAL LOW (ref 6.5–8.1)

## 2023-02-12 LAB — PHOSPHORUS: Phosphorus: 3.1 mg/dL (ref 2.5–4.6)

## 2023-02-12 LAB — TRIGLYCERIDES: Triglycerides: 177 mg/dL — ABNORMAL HIGH (ref <150)

## 2023-02-12 LAB — GLUCOSE, CAPILLARY
Glucose-Capillary: 137 mg/dL — ABNORMAL HIGH (ref 70–99)
Glucose-Capillary: 116 mg/dL — ABNORMAL HIGH (ref 70–99)
Glucose-Capillary: 207 mg/dL — ABNORMAL HIGH (ref 70–99)
Glucose-Capillary: 147 mg/dL — ABNORMAL HIGH (ref 70–99)
Glucose-Capillary: 224 mg/dL — ABNORMAL HIGH (ref 70–99)

## 2023-02-12 LAB — MAGNESIUM: Magnesium: 2.1 mg/dL (ref 1.7–2.4)

## 2023-02-12 LAB — LIPASE, BLOOD: Lipase: 88 U/L — ABNORMAL HIGH (ref 11–51)

## 2023-02-12 MED ORDER — INSULIN ASPART 100 UNIT/ML IJ SOLN
0.0000 [IU] | Freq: Three times a day (TID) | INTRAMUSCULAR | Status: DC
  Administered 2023-02-13 – 2023-02-14 (×6): 2 [IU] via SUBCUTANEOUS
  Administered 2023-02-15 (×2): 3 [IU] via SUBCUTANEOUS

## 2023-02-12 MED ORDER — INSULIN ASPART 100 UNIT/ML IJ SOLN
0.0000 [IU] | Freq: Every day | INTRAMUSCULAR | Status: DC
  Administered 2023-02-12: 2 [IU] via SUBCUTANEOUS
  Administered 2023-02-14: 3 [IU] via SUBCUTANEOUS

## 2023-02-12 MED ORDER — TRAZODONE HCL 50 MG PO TABS
50.0000 mg | ORAL_TABLET | Freq: Every day | ORAL | Status: DC
  Administered 2023-02-12: 50 mg via ORAL
  Filled 2023-02-12: qty 1

## 2023-02-12 NOTE — Progress Notes (Addendum)
Progress Note  Primary GI: Unassigned ([place] PCP)  LOS: 12 days   Chief Complaint: Pancreatitis   Subjective   Patient states that he feels with each meal his pain is improving.  However, reports pain is the worst at nighttime.  Has tolerated bland eggs/toast.  Has tolerated chicken noodle soup, banana, toast.   Objective   Vital signs in last 24 hours: Temp:  [97.9 F (36.6 C)-99 F (37.2 C)] 97.9 F (36.6 C) (05/21 0405) Pulse Rate:  [97-107] 97 (05/21 0405) Resp:  [18-20] 20 (05/21 0405) BP: (112-131)/(73-79) 112/73 (05/21 0405) SpO2:  [97 %-99 %] 97 % (05/21 0405) Weight:  [95.5 kg] 95.5 kg (05/21 0407) Last BM Date : 02/11/23 Last BM recorded by nurses in past 5 days Stool Type: Type 5 (Soft blobs with clear-cut edges) (02/10/2023  9:00 PM)  General:   male in no acute distress, jaundiced, icteric sclera Heart:  Regular rate and rhythm; no murmurs Pulm: Clear anteriorly; no wheezing Abdomen: Soft, nondistended, slight discomfort upon upper abdominal palpation.  Normal bowel sounds. Extremities:  No edema Neurologic:  Alert and  oriented x4;  No focal deficits.  Psych:  Cooperative. Normal mood and affect.  Intake/Output from previous day: 05/20 0701 - 05/21 0700 In: 2050.6 [P.O.:840; I.V.:1110.6; IV Piggyback:100] Out: 5030 [Urine:5030] Intake/Output this shift: No intake/output data recorded.  Studies/Results: CT ABDOMEN PELVIS W CONTRAST  Result Date: 02/11/2023 CLINICAL DATA:  Abdominal pain, acute pancreatitis EXAM: CT ABDOMEN AND PELVIS WITH CONTRAST TECHNIQUE: Multidetector CT imaging of the abdomen and pelvis was performed using the standard protocol following bolus administration of intravenous contrast. RADIATION DOSE REDUCTION: This exam was performed according to the departmental dose-optimization program which includes automated exposure control, adjustment of the mA and/or kV according to patient size and/or use of iterative reconstruction  technique. CONTRAST:  OMNIPAQUE IOHEXOL 300 MG/ML  SOLN COMPARISON:  MRI abdomen dated 02/07/2023. CT abdomen/pelvis dated 01/31/2023. FINDINGS: Lower chest: Trace left pleural effusion with minimal left basilar atelectasis. Hepatobiliary: Liver is within normal limits, noting mild steatosis focal fatty sparing along the gallbladder fossa. Tiny layering gallstones (series 2/image 38), without associated inflammatory changes. No intrahepatic or extrahepatic duct dilatation. Pancreas: Severe acute pancreatitis with suspected progressive parenchymal necrosis along the distal pancreatic tail (series 2/image 36) and anterior pancreatic head/uncinate process (series 2/image 49). Associated progressive acute peripancreatic fluid/inflammatory changes without wall/rim to suggest walled-off necrosis (series 2/image 40). Spleen: Within normal limits, noting trace perisplenic fluid inferiorly (series 2/image 39). Adrenals/Urinary Tract: Adrenal glands are within normal limits. Kidneys are within normal limits.  No hydronephrosis. Bladder is within normal limits. Stomach/Bowel: Mild perigastric fluid and secondary wall thickening due to peripancreatic inflammatory changes. No evidence of bowel obstruction. Normal appendix (series 2/image 7). No colonic wall thickening or inflammatory changes. Vascular/Lymphatic: No evidence of abdominal aortic aneurysm. No suspicious abdominopelvic lymphadenopathy. Reproductive: Prostate is unremarkable. Other: In addition to the peripancreatic fluid, there is mild retroperitoneal/anterior pararenal fluid, left greater than right, and trace presacral fluid. Musculoskeletal: Mild degenerative changes of the lower thoracic spine. IMPRESSION: Severe acute pancreatitis with pancreatic necrosis and acute pancreatic fluid collections, as described above. Cholelithiasis, without associated inflammatory changes. Trace left pleural effusion with minimal left basilar atelectasis. Electronically  Signed   By: Charline Bills M.D.   On: 02/11/2023 08:11    Lab Results: Recent Labs    02/10/23 0350 02/11/23 0410 02/12/23 0430  WBC 13.5* 18.4* 13.9*  HGB 12.4* 12.7* 11.4*  HCT 37.2* 38.7* 34.7*  PLT 246 302 307   BMET Recent Labs    02/10/23 0350 02/11/23 0410 02/12/23 0430  NA 131* 131* 131*  K 3.9 4.5 4.0  CL 96* 93* 95*  CO2 26 29 28   GLUCOSE 199* 141* 132*  BUN 8 12 12   CREATININE 0.58* 0.95 0.79  CALCIUM 7.9* 8.2* 8.0*   LFT Recent Labs    02/12/23 0430  PROT 6.1*  ALBUMIN 2.2*  AST 60*  ALT 54*  ALKPHOS 129*  BILITOT 6.2*   PT/INR No results for input(s): "LABPROT", "INR" in the last 72 hours.   Scheduled Meds:  buPROPion  150 mg Oral Daily   enoxaparin (LOVENOX) injection  40 mg Subcutaneous Q24H   feeding supplement  237 mL Oral BID BM   insulin aspart  0-9 Units Subcutaneous Q4H   insulin detemir  5 Units Subcutaneous BID   lipase/protease/amylase  36,000 Units Oral TID AC   multivitamin with minerals  1 tablet Oral Daily   nicotine  14 mg Transdermal Daily   pantoprazole  40 mg Oral Daily   polyethylene glycol  17 g Oral Daily   Ensure Max Protein  11 oz Oral BID   Continuous Infusions:  lactated ringers 75 mL/hr at 02/12/23 0422   meropenem (MERREM) IV 1 g (02/12/23 0417)      Patient profile:   37 year old male with acute pancreatitis, felt to be secondary to markedly elevated triglycerides.  Has evidence of necrosis on advanced imaging studies.  On meropenem and Flagyl due to leukocytosis and fevers 1 week and his hospitalization.  This is his sixth time with pancreatitis.   MRCP 02/07/2023 shows pancreatic inflammation and fluid collections.  Splenic vein not thought to be completely occluded though does seem to be attenuated with some collateralization.  Extrinsic narrowing of distal CBD secondary to pancreatitis.     Impression:   Acute necrotizing pancreatitis -AST 60/ALT 54/alk phos 129, improving -Total bilirubin 6.2  (7.8), improving -Triglycerides 157 -Lipase 84 -WBC 13.9, improved -Repeat imaging:CT abdomen pelvis with contrast: Severe pancreatitis with progressive necrosis along the pancreatic tail and anterior pancreatic head/uncinate process.  Peripancreatic fluid/inflammatory changes without wall/rim to suggest walled-off necrosis.   Plan:   -Continue broad-spectrum antibiotics -Continue pain control -Continue diet as tolerated -Consider cholecystectomy once acute pancreatitis improves  GI will continue to follow  [name]  02/12/2023, 9:44 AM   Attending physician's note   I have taken a history, reviewed the chart and examined the patient. I performed a substantive portion of this encounter, including complete performance of at least one of the key components, in conjunction with the APP. I agree with the APP's note, impression and recommendations.   Continue diet as tolerated, antibiotics and pain control  Please consult surgery to evaluate for possible cholecystectomy once acute pancreatitis improves  The patient was provided an opportunity to ask questions and all were answered. The patient agreed with the plan and demonstrated an understanding of the instructions.   Iona Beard , MD [phone]

## 2023-02-12 NOTE — Progress Notes (Signed)
Calorie Count Note  48 hour calorie count ordered.  Diet: Soft Supplements: Ensure Plus High protein BID, Ensure Max BID  Day 1 (5/19): Breakfast: 100% Lunch: 0% Dinner: 0% Supplements: Ensure Plus High Protein x3 Total intake: 1843 kcal (92% of minimum estimated needs)  99 protein (99% of minimum estimated needs)  Day 2 (5/20): Breakfast: 100% Lunch: 0% Dinner: 100% Supplements: Ensure Plus High Protein x2, 50% of 1 Ensure Max (per patient report) Total intake: 1793 kcal (89% of minimum estimated needs)  88 protein (88% of minimum estimated needs)   Average intake: 1818 kcal (91% of minimum estimated needs)  93 protein (93% of minimum estimated needs)   Nutrition Dx: Increased nutrient needs related to acute illness (acute pancreatitis) as evidenced by estimated needs.   Goal: Patient will meet greater than or equal to 90% of their needs   Intervention:  - Soft, Low Fat diet per MD. Advance as tolerated.   - Patient has previously received pancreatitis nutrition therapy diet education.   - Ensure Plus High Protein po BID, each supplement provides 350 kcal and 20 grams of protein. - Ensure Max po BID, each supplement provides 150 kcal and 30 grams of protein.              - If patient consumes all supplements he will meet 50% of kcal needs and 100% of protein needs.   - Multivitamin with minerals daily to support micronutrient needs.    - Monitor weight trends.    Shelle Iron RD, LDN For contact information, refer to [facility].

## 2023-02-12 NOTE — Progress Notes (Signed)
PROGRESS NOTE    George Howe  BJY:[id number] DOB: 1986-08-05 DOA: 01/31/2023 PCP: George Howe, George Patricia, MD  Chief Complaint  Patient presents with   Abdominal Pain    Brief Narrative:   37 year old with history of type 2 diabetes on insulin, hypertriglyceridemia on Lipitor, previous episodes of pancreatitis secondary to hypertriglyceridemia, chronic anxiety and depression presented with 2 days of epigastric pain and back pain. In the emergency room triglycerides more than 5000, lipase 653, serum glucose 313 and anion gap of 16. Clinically diagnosed as pancreatitis, started on IV fluids. CT scan showed moderate peripancreatic fat stranding consistent with acute pancreatitis and suspicious for necrosis at the pancreatic tail. Hepatic steatosis. Admitted with acute pancreatitis.   He's had George Howe long, complicated course.  GI following.  His pain is gradually improving.    Assessment & Plan:   Active Problems:   Acute pancreatitis   Necrotizing pancreatitis   Pancreatic necrosis   Abnormal CT of the abdomen   Abdominal pain, epigastric   Elevated liver function tests   Feeding problems   Gallstones  Acute pancreatitis secondary to hypertriglyceridemia  Concern for Necrotizing Pancreatitis  Fever:  CT with moderate peripancreatic fat stranding c/w acute pancreatitis and focal hypoattenuation of the pancreatic tail concerning for necrosis  MRCP with extensive pancreatic inflammation and acute peripancreatic fluid collections, nonenhancing collections with proteinaceous and blood products in the pancreatic tail and separately in the pancreatic head concerning for local pancreatic necrosis --- splenic vein not thought to be completely occluded, but attenuated, small gallstones, small bilateral effusions (see report) Blood cultures NGx2 Currently on soft diet, low fat, ADAT Continue IVF, IV, PO pain meds prn  Abx per GI Strict I/O (net positive 24 L), daily weights (overall weight  down from admission) -> will monitor on LR, not grossly overloaded, continue IVF for now CT abdomen pelvis 5/19 with severe acute pancreatitis with pancreatic necrosis and acute pancreatic fluid collections, cholelithiasis, trace L effusion Appreciate GI recs -> appreciate recs Needs lap chole eventually long term  Hyperbilirubinemia  Elevated Liver Enzymes Some improvement today, will follow  CT 5/9 with layering hyperdensity in gallbladder neck RUQ [place] 5/15 with diffuse hepatic steatosis, 3 nonmobile echogenic foci (small polyps or stones) MRCP with tapering of distal CBD in vicinity of pancreatic head related to extrinsic narrowing due to pancreatitis GI consulted, appreciate recs  Hold statin, will stop fibrate (suspect elevated bili/LFT's due to pancreatitis, but will hold fibrate as can also cause elevated bili/LFTs)  Leukocytosis Due to above - improving  Hypertriglyceridemia  Dyslipidemia Improved after insulin gtt Will continue to monitor on fenofibrate (on hold with elevated LFT's) Hold statin with elevated LFT's Needs lipid clinic at follow up   Attenuation of Splenic Vein Discussed with radiology, low suspicion for thrombus, suspects this is due to inflammation/narrowing  Insulin-dependent type 2 diabetes, hypoglycemia: Blood sugars are acceptable now. long-acting insulin, 5 units twice daily, continue sliding scale insulin.   Insomnia Trial trazodone  Chronic anxiety depression: Currently on Wellbutrin.  Will continue. Smoker: Counseled to quit.  Provide nicotine patch. Hypocalcemia resolved, corrects with albumin Hypophosphatemia: resolved  Hypomagnesemia: resolved Hypokalemia: resolved Hyponatremia: mild, follow     DVT prophylaxis: lovenox Code Status: full Family Communication: none Disposition:   Status is: Inpatient Remains inpatient appropriate because: need for continued inpatient care   Consultants:  GI  Procedures:  none  Antimicrobials:   Anti-infectives (From admission, onward)    Start     Dose/Rate Route Frequency Ordered Stop  02/08/23 2200  metroNIDAZOLE (FLAGYL) IVPB 500 mg  Status:  Discontinued        500 mg 100 mL/hr over 60 Minutes Intravenous Every 8 hours 02/08/23 1324 02/11/23 1328   02/08/23 1200  meropenem (MERREM) 1 g in sodium chloride 0.9 % 100 mL IVPB        1 g 200 mL/hr over 30 Minutes Intravenous Every 8 hours 02/08/23 1105     02/08/23 1000  piperacillin-tazobactam (ZOSYN) IVPB 3.375 g  Status:  Discontinued        3.375 g 12.5 mL/hr over 240 Minutes Intravenous Every 8 hours 02/08/23 0900 02/08/23 1105   02/01/23 1300  cefTRIAXone (ROCEPHIN) 2 g in sodium chloride 0.9 % 100 mL IVPB  Status:  Discontinued        2 g 200 mL/hr over 30 Minutes Intravenous Every 24 hours 01/31/23 1607 02/01/23 0748   01/31/23 1215  cefTRIAXone (ROCEPHIN) 2 g in sodium chloride 0.9 % 100 mL IVPB        2 g 200 mL/hr over 30 Minutes Intravenous  Once 01/31/23 1203 01/31/23 1313   01/31/23 1215  metroNIDAZOLE (FLAGYL) IVPB 500 mg  Status:  Discontinued        500 mg 100 mL/hr over 60 Minutes Intravenous Every 8 hours 01/31/23 1203 02/01/23 0748       Subjective: No new complaints  Objective: Vitals:   02/11/23 2032 02/12/23 0405 02/12/23 0407 02/12/23 1430  BP: 131/79 112/73  (!) 121/92  Pulse: (!) 107 97  99  Resp: 20 20  (!) 22  Temp: 99 F (37.2 C) 97.9 F (36.6 C)  98.1 F (36.7 C)  TempSrc: Oral Oral  Oral  SpO2: 98% 97%  98%  Weight:   95.5 kg   Height:        Intake/Output Summary (Last 24 hours) at 02/12/2023 1845 Last data filed at 02/12/2023 1016 Gross per 24 hour  Intake 1450.57 ml  Output 3230 ml  Net -1779.43 ml   Filed Weights   02/10/23 0500 02/11/23 0431 02/12/23 0407  Weight: 95 kg 94.6 kg 95.5 kg    Examination:  General: No acute distress. Cardiovascular: RRR Lungs: unlabored Abdomen: improving abdominal pain, soft, non distended - less grimacing today Neurological:  Alert and oriented 3. Moves all extremities 4 with equal strength. Cranial nerves II through XII grossly intact. Extremities: No clubbing or cyanosis. No edema.   Data Reviewed: I have personally reviewed following labs and imaging studies  CBC: Recent Labs  Lab 02/08/23 0421 02/09/23 0453 02/10/23 0350 02/11/23 0410 02/12/23 0430  WBC 16.1* 15.0* 13.5* 18.4* 13.9*  NEUTROABS 13.0* 12.0* 10.8* 14.7* 10.4*  HGB 12.8* 12.6* 12.4* 12.7* 11.4*  HCT 39.5 37.6* 37.2* 38.7* 34.7*  MCV 85.5 85.1 85.3 85.6 85.3  PLT 208 211 246 302 307    Basic Metabolic Panel: Recent Labs  Lab 02/08/23 0421 02/08/23 0909 02/08/23 1552 02/09/23 0453 02/10/23 0350 02/11/23 0410 02/12/23 0430  NA  --    < > 131* 131* 131* 131* 131*  K  --    < > 3.9 3.9 3.9 4.5 4.0  CL  --    < > 94* 96* 96* 93* 95*  CO2  --    < > 29 25 26 29 28   GLUCOSE  --    < > 148* 148* 199* 141* 132*  BUN  --    < > 10 11 8 12 12   CREATININE  --    < >  0.64 0.71 0.58* 0.95 0.79  CALCIUM  --    < > 8.3* 8.1* 7.9* 8.2* 8.0*  MG 2.0  --   --  1.9 1.9 2.3 2.1  PHOS 4.3  --   --  3.7 2.9 3.5 3.1   < > = values in this interval not displayed.    GFR: Estimated Creatinine Clearance: 131.8 mL/min (by C-G formula based on SCr of 0.79 mg/dL).  Liver Function Tests: Recent Labs  Lab 02/08/23 0909 02/09/23 0453 02/10/23 0350 02/11/23 0410 02/12/23 0430  AST 100* 83* 85* 62* 60*  ALT 84* 76* 76* 66* 54*  ALKPHOS 117 97 112 134* 129*  BILITOT 8.9* 10.0* 12.6* 7.8* 6.2*  PROT 6.5 6.2* 6.1* 6.6 6.1*  ALBUMIN 2.7* 2.3* 2.3* 2.4* 2.2*    CBG: Recent Labs  Lab 02/11/23 2334 02/12/23 0402 02/12/23 0746 02/12/23 1129 02/12/23 1650  GLUCAP 172* 137* 116* 207* 147*     Recent Results (from the past 240 hour(s))  Culture, blood (Routine X 2) w Reflex to ID Panel     Status: None (Preliminary result)   Collection Time: 02/08/23  9:01 AM   Specimen: BLOOD  Result Value Ref Range Status   Specimen Description    Final    BLOOD BLOOD RIGHT ARM Performed at [facility], [address], [place], [place] [address]    Special Requests   Final    BOTTLES DRAWN AEROBIC AND ANAEROBIC Blood Culture adequate volume Performed at [facility], [address] [place], [place] [address]    Culture   Final    NO GROWTH 4 DAYS Performed at [facility], [address] [place], [place] [address]    Report Status PENDING  Incomplete  Culture, blood (Routine X 2) w Reflex to ID Panel     Status: None (Preliminary result)   Collection Time: 02/08/23  9:09 AM   Specimen: BLOOD  Result Value Ref Range Status   Specimen Description   Final    BLOOD BLOOD RIGHT ARM Performed at [facility], [address], [place], [place] [address]    Special Requests   Final    BOTTLES DRAWN AEROBIC AND ANAEROBIC Blood Culture adequate volume Performed at [facility], [address], [place], [place] [address]    Culture   Final    NO GROWTH 4 DAYS Performed at [facility], [address] [place], [place] [address]    Report Status PENDING  Incomplete         Radiology Studies: No results found.      Scheduled Meds:  buPROPion  150 mg Oral Daily   enoxaparin (LOVENOX) injection  40 mg Subcutaneous Q24H   feeding supplement  237 mL Oral BID BM   insulin aspart  0-9 Units Subcutaneous Q4H   insulin detemir  5 Units Subcutaneous BID   lipase/protease/amylase  36,000 Units Oral TID AC   multivitamin with minerals  1 tablet Oral Daily   nicotine  14 mg Transdermal Daily   pantoprazole  40 mg Oral Daily   polyethylene glycol  17 g Oral Daily   Ensure Max Protein  11 oz Oral BID   traZODone  50 mg Oral QHS   Continuous Infusions:  lactated ringers 75 mL/hr at 02/12/23 0422   meropenem (MERREM) IV 1 g (02/12/23 1312)     LOS: 12 days    Time spent: over 30 min  Lacretia Nicks, MD [facility]   To contact the attending provider between 7A-7P or the covering provider during after hours 7P-7A, please log into the web site [email or web address] and access using universal [place] password for that web site. If you do not have the password, please call the hospital operator.  02/12/2023, 6:45 PM

## 2023-02-13 DIAGNOSIS — K8020 Calculus of gallbladder without cholecystitis without obstruction: Secondary | ICD-10-CM | POA: Diagnosis not present

## 2023-02-13 DIAGNOSIS — K8512 Biliary acute pancreatitis with infected necrosis: Secondary | ICD-10-CM | POA: Diagnosis not present

## 2023-02-13 DIAGNOSIS — K859 Acute pancreatitis without necrosis or infection, unspecified: Secondary | ICD-10-CM | POA: Diagnosis not present

## 2023-02-13 LAB — CBC WITH DIFFERENTIAL/PLATELET
Abs Immature Granulocytes: 0.21 10*3/uL — ABNORMAL HIGH (ref 0.00–0.07)
Basophils Absolute: 0 10*3/uL (ref 0.0–0.1)
Basophils Relative: 0 %
Eosinophils Absolute: 0.2 10*3/uL (ref 0.0–0.5)
Eosinophils Relative: 2 %
HCT: 37.9 % — ABNORMAL LOW (ref 39.0–52.0)
Hemoglobin: 12 g/dL — ABNORMAL LOW (ref 13.0–17.0)
Immature Granulocytes: 2 %
Lymphocytes Relative: 20 %
Lymphs Abs: 2.4 10*3/uL (ref 0.7–4.0)
MCH: 28 pg (ref 26.0–34.0)
MCHC: 31.7 g/dL (ref 30.0–36.0)
MCV: 88.3 fL (ref 80.0–100.0)
Monocytes Absolute: 0.8 10*3/uL (ref 0.1–1.0)
Monocytes Relative: 6 %
Neutro Abs: 8.7 10*3/uL — ABNORMAL HIGH (ref 1.7–7.7)
Neutrophils Relative %: 70 %
Platelets: 427 10*3/uL — ABNORMAL HIGH (ref 150–400)
RBC: 4.29 MIL/uL (ref 4.22–5.81)
RDW: 14.4 % (ref 11.5–15.5)
WBC: 12.3 10*3/uL — ABNORMAL HIGH (ref 4.0–10.5)
nRBC: 0 % (ref 0.0–0.2)

## 2023-02-13 LAB — PHOSPHORUS: Phosphorus: 3.5 mg/dL (ref 2.5–4.6)

## 2023-02-13 LAB — LIPASE, BLOOD: Lipase: 99 U/L — ABNORMAL HIGH (ref 11–51)

## 2023-02-13 LAB — GLUCOSE, CAPILLARY
Glucose-Capillary: 150 mg/dL — ABNORMAL HIGH (ref 70–99)
Glucose-Capillary: 129 mg/dL — ABNORMAL HIGH (ref 70–99)
Glucose-Capillary: 154 mg/dL — ABNORMAL HIGH (ref 70–99)
Glucose-Capillary: 172 mg/dL — ABNORMAL HIGH (ref 70–99)
Glucose-Capillary: 196 mg/dL — ABNORMAL HIGH (ref 70–99)
Glucose-Capillary: 196 mg/dL — ABNORMAL HIGH (ref 70–99)

## 2023-02-13 LAB — CULTURE, BLOOD (ROUTINE X 2)

## 2023-02-13 LAB — TRIGLYCERIDES: Triglycerides: 189 mg/dL — ABNORMAL HIGH (ref <150)

## 2023-02-13 LAB — MAGNESIUM: Magnesium: 1.9 mg/dL (ref 1.7–2.4)

## 2023-02-13 MED ORDER — MELATONIN 3 MG PO TABS: 6.0000 mg | ORAL_TABLET | Freq: Every evening | ORAL | Status: DC | PRN

## 2023-02-13 MED ORDER — DIPHENHYDRAMINE HCL 25 MG PO CAPS
25.0000 mg | ORAL_CAPSULE | Freq: Every evening | ORAL | Status: DC | PRN
  Administered 2023-02-13: 25 mg via ORAL
  Filled 2023-02-13: qty 1

## 2023-02-13 NOTE — Progress Notes (Signed)
PROGRESS NOTE  George Howe ZOX:[id number] DOB: [birth year] DOA: 01/31/2023 PCP: [name], [name], MD   LOS: 13 days   Brief Narrative / Interim history: 37 year old with history of type 2 diabetes on insulin, hypertriglyceridemia on Lipitor, previous episodes of pancreatitis secondary to hypertriglyceridemia, chronic anxiety and depression presented with 2 days of epigastric pain and back pain. In the emergency room triglycerides more than 5000, lipase 653, serum glucose 313 and anion gap of 16. Clinically diagnosed as pancreatitis, started on IV fluids. CT scan showed moderate peripancreatic fat stranding consistent with acute pancreatitis and suspicious for necrosis at the pancreatic tail. Hepatic steatosis.   Subjective / 24h Interval events: Tolerating a soft diet with less pain.  Still has some pain overnight but better this morning  Assesement and Plan: Active Problems:   Acute pancreatitis   Necrotizing pancreatitis   Pancreatic necrosis   Abnormal CT of the abdomen   Abdominal pain, epigastric   Elevated liver function tests   Feeding problems   Gallstones  Principal problem Acute necrotizing pancreatitis due to hypertriglyceridemia -triglycerides undetectably high on admission greater than 5000.  Eventually improved, 189 this morning.  He had a protracted and prolonged hospital course, GI consulted, but now he appears to be improving and tolerating a soft diet.  Still requiring IV Dilaudid, encourage oral oxycodone as much as possible.  GI recommends surgical consultation to evaluate for cholecystectomy given cholelithiasis, but that is likely to happen as an outpatient in a few weeks rather than now  Active problems Elevated LFTs, hyperbilirubinemia -still elevated bilirubin, was up to 12.6, now down to 6.2.  Abdominal imaging showed mild steatosis.  GI following  Cholelithiasis -need eventual cholecystectomy, timing to be determined per surgery  Leukocytosis - Due  to above - improving   Hypertriglyceridemia  Dyslipidemia - Improved after insulin gtt. Hold statin with elevated LFT's  Attenuation of Splenic Vein - Discussed with radiology, low suspicion for thrombus, suspects this is due to inflammation/narrowing   Insulin-dependent type 2 diabetes, hypoglycemia - Blood sugars are acceptable now.   Insomnia - trazodone   Chronic anxiety depression -Currently on Wellbutrin.  Will continue.  Smoker -Counseled to quit.  Provide nicotine patch.  Hypocalcemia -resolved, corrects with albumin  Hypophosphatemia -resolved   Hypomagnesemia -resolved  Hypokalemia -resolved  Hyponatremia -mild, follow  Scheduled Meds:  buPROPion  150 mg Oral Daily   enoxaparin (LOVENOX) injection  40 mg Subcutaneous Q24H   feeding supplement  237 mL Oral BID BM   insulin aspart  0-5 Units Subcutaneous QHS   insulin aspart  0-9 Units Subcutaneous TID WC   insulin detemir  5 Units Subcutaneous BID   lipase/protease/amylase  36,000 Units Oral TID AC   multivitamin with minerals  1 tablet Oral Daily   nicotine  14 mg Transdermal Daily   pantoprazole  40 mg Oral Daily   polyethylene glycol  17 g Oral Daily   Ensure Max Protein  11 oz Oral BID   traZODone  50 mg Oral QHS   Continuous Infusions:  lactated ringers 75 mL/hr at 02/12/23 2137   meropenem (MERREM) IV 1 g (02/13/23 0423)   PRN Meds:.baclofen, HYDROmorphone (DILAUDID) injection, labetalol, mouth rinse, oxyCODONE **OR** oxyCODONE, polyethylene glycol, prochlorperazine  Current Outpatient Medications  Medication Instructions   atorvastatin (LIPITOR) 40 mg, Oral, Daily   Basaglar KwikPen 33 Units, Subcutaneous, Daily   buPROPion (WELLBUTRIN XL) 150 mg, Oral, Daily   cetirizine (ZYRTEC) 10 mg, Oral, Daily PRN   Continuous Glucose  Sensor (FREESTYLE LIBRE 3 SENSOR) MISC Place 1 sensor on the skin every 14 days. Use to check glucose continuously   insulin aspart (NOVOLOG FLEXPEN) 100 UNIT/ML FlexPen Inject  4 units with each meal. Additionally, add 3 units for each 40 points above 140.   Insulin Pen Needle (PEN NEEDLES) 32G X 4 MM MISC Use 1 pen needle to inject insulin 4 times per day.   Multiple Vitamins-Minerals (MULTIVITAMIN WITH MINERALS) tablet 1 tablet, Oral, Daily   sertraline (ZOLOFT) 100 mg, Oral, Daily    Diet Orders (From admission, onward)     Start     Ordered   02/09/23 1147  DIET SOFT Room service appropriate? Yes; Fluid consistency: Thin  Diet effective now       Comments: Low fat diet  Question Answer Comment  Room service appropriate? Yes   Fluid consistency: Thin      02/09/23 1146            DVT prophylaxis: enoxaparin (LOVENOX) injection 40 mg Start: 02/02/23 1000   Lab Results  Component Value Date   PLT 427 (H) 02/13/2023      Code Status: Full Code  Family Communication: no family at bedside   Status is: Inpatient Remains inpatient appropriate because: severity of illness  Level of care: Progressive  Consultants:  GI Surgery  Objective: Vitals:   02/12/23 1430 02/12/23 2150 02/13/23 0408 02/13/23 0613  BP: (!) 121/92 116/80 131/78   Pulse: 99 (!) 101 87   Resp: (!) 22 20 20    Temp: 98.1 F (36.7 C) 100.2 F (37.9 C) 98.8 F (37.1 C)   TempSrc: Oral Oral Oral   SpO2: 98% 98% 99%   Weight:    95.3 kg  Height:        Intake/Output Summary (Last 24 hours) at 02/13/2023 1153 Last data filed at 02/13/2023 1100 Gross per 24 hour  Intake 2081.41 ml  Output 2800 ml  Net -718.59 ml   Wt Readings from Last 3 Encounters:  02/13/23 95.3 kg  10/10/22 95.9 kg  10/01/22 96.3 kg    Examination:  Constitutional: NAD Eyes: scleral icterus present ENMT: Mucous membranes are moist.  Neck: normal, supple Respiratory: clear to auscultation bilaterally, no wheezing, no crackles. Normal respiratory effort. No accessory muscle use.  Cardiovascular: Regular rate and rhythm, no murmurs / rubs / gallops. No LE edema.  Abdomen: non distended, no  tenderness. Bowel sounds positive.  Musculoskeletal: no clubbing / cyanosis.   Data Reviewed: I have independently reviewed following labs and imaging studies   CBC Recent Labs  Lab 02/09/23 0453 02/10/23 0350 02/11/23 0410 02/12/23 0430 02/13/23 0439  WBC 15.0* 13.5* 18.4* 13.9* 12.3*  HGB 12.6* 12.4* 12.7* 11.4* 12.0*  HCT 37.6* 37.2* 38.7* 34.7* 37.9*  PLT 211 246 302 307 427*  MCV 85.1 85.3 85.6 85.3 88.3  MCH 28.5 28.4 28.1 28.0 28.0  MCHC 33.5 33.3 32.8 32.9 31.7  RDW 13.7 13.9 14.2 14.2 14.4  LYMPHSABS 1.6 1.3 1.9 1.9 2.4  MONOABS 1.1* 1.2* 1.4* 1.2* 0.8  EOSABS 0.1 0.1 0.1 0.1 0.2  BASOSABS 0.0 0.0 0.1 0.0 0.0    Recent Labs  Lab 02/08/23 0909 02/08/23 1552 02/09/23 0453 02/09/23 1845 02/10/23 0350 02/11/23 0410 02/12/23 0430 02/13/23 0439  NA 128* 131* 131*  --  131* 131* 131*  --   K 4.0 3.9 3.9  --  3.9 4.5 4.0  --   CL 94* 94* 96*  --  96* 93* 95*  --  CO2 23 29 25   --  26 29 28   --   GLUCOSE 215* 148* 148*  --  199* 141* 132*  --   BUN 11 10 11   --  8 12 12   --   CREATININE 0.64 0.64 0.71  --  0.58* 0.95 0.79  --   CALCIUM 8.1* 8.3* 8.1*  --  7.9* 8.2* 8.0*  --   AST 100*  --  83*  --  85* 62* 60*  --   ALT 84*  --  76*  --  76* 66* 54*  --   ALKPHOS 117  --  97  --  112 134* 129*  --   BILITOT 8.9*  --  10.0*  --  12.6* 7.8* 6.2*  --   ALBUMIN 2.7*  --  2.3*  --  2.3* 2.4* 2.2*  --   MG  --   --  1.9  --  1.9 2.3 2.1 1.9  BNP  --   --   --  56.4  --  35.3  --   --     ------------------------------------------------------------------------------------------------------------------ Recent Labs    02/12/23 0430 02/13/23 0439  TRIG 177* 189*    Lab Results  Component Value Date   HGBA1C 10.8 (H) 02/01/2023   ------------------------------------------------------------------------------------------------------------------ No results for input(s): "TSH", "T4TOTAL", "T3FREE", "THYROIDAB" in the last 72 hours.  Invalid input(s):  "FREET3"  Cardiac Enzymes No results for input(s): "CKMB", "TROPONINI", "MYOGLOBIN" in the last 168 hours.  Invalid input(s): "CK" ------------------------------------------------------------------------------------------------------------------    Component Value Date/Time   BNP 35.3 02/11/2023 0410    CBG: Recent Labs  Lab 02/12/23 2144 02/13/23 0110 02/13/23 0405 02/13/23 0810 02/13/23 1136  GLUCAP 224* 150* 129* 154* 172*    Recent Results (from the past 240 hour(s))  Culture, blood (Routine X 2) w Reflex to ID Panel     Status: None   Collection Time: 02/08/23  9:01 AM   Specimen: BLOOD  Result Value Ref Range Status   Specimen Description   Final    BLOOD BLOOD RIGHT ARM Performed at [facility], [address] [place], [place] [address]    Special Requests   Final    BOTTLES DRAWN AEROBIC AND ANAEROBIC Blood Culture adequate volume Performed at [facility], [address], [place], [place] [address]    Culture   Final    NO GROWTH 5 DAYS Performed at [facility], [address] [place], [place] [address]    Report Status 02/13/2023 FINAL  Final  Culture, blood (Routine X 2) w Reflex to ID Panel     Status: None   Collection Time: 02/08/23  9:09 AM   Specimen: BLOOD  Result Value Ref Range Status   Specimen Description   Final    BLOOD BLOOD RIGHT ARM Performed at [facility], [address] [place], [place] [address]    Special Requests   Final    BOTTLES DRAWN AEROBIC AND ANAEROBIC Blood Culture adequate volume Performed at [facility], [address], [place], [place] [address]    Culture   Final    NO GROWTH 5 DAYS Performed at [facility], [address], [place], [place] [address]    Report Status 02/13/2023 FINAL  Final     Radiology Studies: No results found.   Pamella Pert, MD, PhD [facility]  Between 7 am - 7 pm I am available,  please contact me  via Amion (for emergencies) or Securechat (non urgent messages)  Between 7 pm - 7 am I am not available, please contact night coverage MD/APP via Amion

## 2023-02-13 NOTE — Progress Notes (Addendum)
Progress Note  Primary GI: Unassigned ([facility] PCP)   LOS: 13 days   Chief Complaint: acute pancreatitis   Subjective   Patient states his pain last night is better than it has been previous nights. Not having much pain today. Tolerating diet without difficulty.   Objective   Vital signs in last 24 hours: Temp:  [98.1 F (36.7 C)-100.2 F (37.9 C)] 98.8 F (37.1 C) (05/22 0408) Pulse Rate:  [87-101] 87 (05/22 0408) Resp:  [20-22] 20 (05/22 0408) BP: (116-131)/(78-92) 131/78 (05/22 0408) SpO2:  [98 %-99 %] 99 % (05/22 0408) Weight:  [95.3 kg] 95.3 kg (05/22 3244) Last BM Date : 02/13/23 Last BM recorded by nurses in past 5 days Stool Type: Type 5 (Soft blobs with clear-cut edges) (02/10/2023  9:00 PM)  General:   male in no acute distress, icterus sclera Heart:  Regular rate and rhythm; no murmurs Pulm: Clear anteriorly; no wheezing Abdomen: soft, nondistended, normal bowel sounds in all quadrants. Nontender without guarding. No organomegaly appreciated. Extremities:  No edema Neurologic:  Alert and  oriented x4;  No focal deficits.  Psych:  Cooperative. Normal mood and affect.  Intake/Output from previous day: 05/21 0701 - 05/22 0700 In: 2081.4 [P.O.:360; I.V.:1621.4; IV Piggyback:100] Out: 2100 [Urine:2100] Intake/Output this shift: Total I/O In: 240 [P.O.:240] Out: -   Studies/Results: No results found.  Lab Results: Recent Labs    02/11/23 0410 02/12/23 0430 02/13/23 0439  WBC 18.4* 13.9* 12.3*  HGB 12.7* 11.4* 12.0*  HCT 38.7* 34.7* 37.9*  PLT 302 307 427*   BMET Recent Labs    02/11/23 0410 02/12/23 0430  NA 131* 131*  K 4.5 4.0  CL 93* 95*  CO2 29 28  GLUCOSE 141* 132*  BUN 12 12  CREATININE 0.95 0.79  CALCIUM 8.2* 8.0*   LFT Recent Labs    02/12/23 0430  PROT 6.1*  ALBUMIN 2.2*  AST 60*  ALT 54*  ALKPHOS 129*  BILITOT 6.2*   PT/INR No results for input(s): "LABPROT", "INR" in the last 72 hours.   Scheduled Meds:   buPROPion  150 mg Oral Daily   enoxaparin (LOVENOX) injection  40 mg Subcutaneous Q24H   feeding supplement  237 mL Oral BID BM   insulin aspart  0-5 Units Subcutaneous QHS   insulin aspart  0-9 Units Subcutaneous TID WC   insulin detemir  5 Units Subcutaneous BID   lipase/protease/amylase  36,000 Units Oral TID AC   multivitamin with minerals  1 tablet Oral Daily   nicotine  14 mg Transdermal Daily   pantoprazole  40 mg Oral Daily   polyethylene glycol  17 g Oral Daily   Ensure Max Protein  11 oz Oral BID   traZODone  50 mg Oral QHS   Continuous Infusions:  lactated ringers 75 mL/hr at 02/12/23 2137   meropenem (MERREM) IV 1 g (02/13/23 0423)      Patient profile:   37 year old male with acute pancreatitis, felt to be secondary to markedly elevated triglycerides.  Has evidence of necrosis on advanced imaging studies.  On meropenem and Flagyl due to leukocytosis and fevers 1 week and his hospitalization.  This is his sixth time with pancreatitis.   MRCP 02/07/2023 shows pancreatic inflammation and fluid collections.  Splenic vein not thought to be completely occluded though does seem to be attenuated with some collateralization.  Extrinsic narrowing of distal CBD secondary to pancreatitis.   Impression:   Acute necrotizing pancreatitis -AST 60/ALT 54/alk phos  129, improving -Total bilirubin 6.2 (7.8), improving -Triglycerides 189 -Lipase 99 -WBC 12.3, improved -Repeat imaging:CT abdomen pelvis with contrast: Severe pancreatitis with progressive necrosis along the pancreatic tail and anterior pancreatic head/uncinate process.  Peripancreatic fluid/inflammatory changes without wall/rim to suggest walled-off necrosis.    Plan:   - general surgery team planning to see patient today to discuss timing of cholecystectomy (once pancreatitis improves) - continue abx - continue pain control and supportive care - continue dit as tolerated - repeat CMP tomorrow to trend LFTs  [name]  02/13/2023, 9:41 AM   Attending physician's note   I have taken a history, reviewed the chart and examined the patient. I performed a substantive portion of this encounter, including complete performance of at least one of the key components, in conjunction with the APP. I agree with the APP's note, impression and recommendations.   Overall improving Continue antibiotics for 14-day course Advance diet as tolerated We will follow-up on surgery consult for possible cholecystectomy once pancreatitis improves  Will arrange for outpatient GI follow-up  Continue supportive care  GI is available if needed, please call with any questions  The patient was provided an opportunity to ask questions and all were answered. The patient agreed with the plan and demonstrated an understanding of the instructions.   Iona Beard , MD [phone]

## 2023-02-13 NOTE — Plan of Care (Signed)
  Problem: Education: Goal: Knowledge of General Education information will improve Description: Including pain rating scale, medication(s)/side effects and non-pharmacologic comfort measures Outcome: Not Progressing   Problem: Health Behavior/Discharge Planning: Goal: Ability to manage health-related needs will improve Outcome: Not Progressing   

## 2023-02-13 NOTE — Plan of Care (Signed)

## 2023-02-13 NOTE — Progress Notes (Addendum)
Patient ID: George Howe, male   DOB: 1985/10/16, 37 y.o.   MRN: [id number]   George Howe is well-known to our service.  His severe pancreatitis continues to improve clinically but the amount of inflammation remains impressive on his CT scan.  He does have pancreatic necrosis and fluid but this does not look infectious and I do not believe he needs a laparotomy with pancreatic debridement  We would not perform a laparoscopic cholecystectomy this admission secondary to the amount of inflammation and the risk this would create as far as needing to convert to an open procedure or injury to the bile duct.  We remove this to at least improved for up to 6 more weeks prior to any surgery.  One of the scans does suggests that there may be small stones in the gallbladder and this could be the cause of the pancreatitis and not the high triglycerides.  Will have him follow-up with Dr. Freida Busman in the office after discharge to then discuss the eventual surgery  I explained this to him and he agrees with plans.  From a surgical standpoint, he can be discharged when he is medically stable

## 2023-02-13 NOTE — Plan of Care (Signed)
  Problem: Education: Goal: Knowledge of General Education information will improve Description: Including pain rating scale, medication(s)/side effects and non-pharmacologic comfort measures Outcome: Progressing   Problem: Clinical Measurements: Goal: Ability to maintain clinical measurements within normal limits will improve Outcome: Progressing   Problem: Activity: Goal: Risk for activity intolerance will decrease Outcome: Progressing   Problem: Nutrition: Goal: Adequate nutrition will be maintained Outcome: Progressing   Problem: Elimination: Goal: Will not experience complications related to bowel motility Outcome: Progressing Goal: Will not experience complications related to urinary retention Outcome: Progressing   

## 2023-02-14 DIAGNOSIS — K8020 Calculus of gallbladder without cholecystitis without obstruction: Secondary | ICD-10-CM

## 2023-02-14 DIAGNOSIS — K8512 Biliary acute pancreatitis with infected necrosis: Secondary | ICD-10-CM

## 2023-02-14 LAB — CBC WITH DIFFERENTIAL/PLATELET
Abs Immature Granulocytes: 0.06 10*3/uL (ref 0.00–0.07)
Basophils Absolute: 0 10*3/uL (ref 0.0–0.1)
Basophils Relative: 0 %
Eosinophils Absolute: 0.2 10*3/uL (ref 0.0–0.5)
Eosinophils Relative: 2 %
HCT: 33.6 % — ABNORMAL LOW (ref 39.0–52.0)
Hemoglobin: 10.7 g/dL — ABNORMAL LOW (ref 13.0–17.0)
Immature Granulocytes: 1 %
Lymphocytes Relative: 21 %
Lymphs Abs: 2 10*3/uL (ref 0.7–4.0)
MCH: 28.1 pg (ref 26.0–34.0)
MCHC: 31.8 g/dL (ref 30.0–36.0)
MCV: 88.2 fL (ref 80.0–100.0)
Monocytes Absolute: 0.7 10*3/uL (ref 0.1–1.0)
Monocytes Relative: 7 %
Neutro Abs: 6.5 10*3/uL (ref 1.7–7.7)
Neutrophils Relative %: 69 %
Platelets: 351 10*3/uL (ref 150–400)
RBC: 3.81 MIL/uL — ABNORMAL LOW (ref 4.22–5.81)
RDW: 14.3 % (ref 11.5–15.5)
WBC: 9.4 10*3/uL (ref 4.0–10.5)
nRBC: 0 % (ref 0.0–0.2)

## 2023-02-14 LAB — COMPREHENSIVE METABOLIC PANEL
ALT: 52 U/L — ABNORMAL HIGH (ref 0–44)
AST: 52 U/L — ABNORMAL HIGH (ref 15–41)
Albumin: 2.3 g/dL — ABNORMAL LOW (ref 3.5–5.0)
Alkaline Phosphatase: 107 U/L (ref 38–126)
Anion gap: 8 (ref 5–15)
BUN: 11 mg/dL (ref 6–20)
CO2: 29 mmol/L (ref 22–32)
Calcium: 8.1 mg/dL — ABNORMAL LOW (ref 8.9–10.3)
Chloride: 95 mmol/L — ABNORMAL LOW (ref 98–111)
Creatinine, Ser: 0.88 mg/dL (ref 0.61–1.24)
GFR, Estimated: >60 mL/min (ref >60)
Glucose, Bld: 175 mg/dL — ABNORMAL HIGH (ref 70–99)
Potassium: 4 mmol/L (ref 3.5–5.1)
Sodium: 132 mmol/L — ABNORMAL LOW (ref 135–145)
Total Bilirubin: 3.1 mg/dL — ABNORMAL HIGH (ref 0.3–1.2)
Total Protein: 6.2 g/dL — ABNORMAL LOW (ref 6.5–8.1)

## 2023-02-14 LAB — TRIGLYCERIDES: Triglycerides: 175 mg/dL — ABNORMAL HIGH (ref <150)

## 2023-02-14 LAB — GLUCOSE, CAPILLARY
Glucose-Capillary: 161 mg/dL — ABNORMAL HIGH (ref 70–99)
Glucose-Capillary: 197 mg/dL — ABNORMAL HIGH (ref 70–99)
Glucose-Capillary: 191 mg/dL — ABNORMAL HIGH (ref 70–99)
Glucose-Capillary: 269 mg/dL — ABNORMAL HIGH (ref 70–99)

## 2023-02-14 LAB — LIPASE, BLOOD: Lipase: 104 U/L — ABNORMAL HIGH (ref 11–51)

## 2023-02-14 LAB — PHOSPHORUS: Phosphorus: 3.7 mg/dL (ref 2.5–4.6)

## 2023-02-14 LAB — MAGNESIUM: Magnesium: 1.9 mg/dL (ref 1.7–2.4)

## 2023-02-14 MED ORDER — TRAZODONE HCL 100 MG PO TABS
100.0000 mg | ORAL_TABLET | Freq: Every day | ORAL | Status: DC
  Administered 2023-02-14: 100 mg via ORAL
  Filled 2023-02-14: qty 1

## 2023-02-14 NOTE — Plan of Care (Signed)
  Problem: Education: Goal: Knowledge of General Education information will improve Description: Including pain rating scale, medication(s)/side effects and non-pharmacologic comfort measures 02/14/2023 0819 by Val Eagle, RN Outcome: Not Progressing 02/13/2023 2007 by Val Eagle, RN Outcome: Not Progressing   Problem: Health Behavior/Discharge Planning: Goal: Ability to manage health-related needs will improve 02/14/2023 0819 by Val Eagle, RN Outcome: Not Progressing 02/13/2023 2007 by Val Eagle, RN Outcome: Not Progressing

## 2023-02-14 NOTE — Progress Notes (Signed)
PROGRESS NOTE  George Howe ION:[id number] DOB: Dec 11, 1985 DOA: 01/31/2023 PCP: [name], [name], MD   LOS: 14 days   Brief Narrative / Interim history: 37 year old with history of type 2 diabetes on insulin, hypertriglyceridemia on Lipitor, previous episodes of pancreatitis secondary to hypertriglyceridemia, chronic anxiety and depression presented with 2 days of epigastric pain and back pain. In the emergency room triglycerides more than 5000, lipase 653, serum glucose 313 and anion gap of 16. Clinically diagnosed as pancreatitis, started on IV fluids. CT scan showed moderate peripancreatic fat stranding consistent with acute pancreatitis and suspicious for necrosis at the pancreatic tail. Hepatic steatosis.   Subjective / 24h Interval events: He continues to tolerate a diet.  Has still required some IV Dilaudid but fewer doses.  He thinks he can go without it today  Assesement and Plan: Active Problems:   Acute pancreatitis   Necrotizing pancreatitis   Pancreatic necrosis   Abnormal CT of the abdomen   Abdominal pain, epigastric   Elevated liver function tests   Feeding problems   Gallstones   Calculus of gallbladder without cholecystitis without obstruction  Principal problem Acute necrotizing pancreatitis due to hypertriglyceridemia -triglycerides undetectably high on admission greater than 5000.  Eventually improved and have remained stable.  He had a protracted and prolonged hospital course, GI consulted, but now he appears to be improving and tolerating a soft diet.  Still requiring IV Dilaudid, encourage oral oxycodone as much as possible.  Plan for home tomorrow if his pain is controlled.  Discussed with GI, there was concern about infected necrosis and he was started on IV antibiotics.  Discussed with ID, will convert to Augmentin on discharge plan for 14 total days.  Surgery evaluated patient and recommending cholecystectomy as an outpatient several weeks down the road  if he continues to improve  Active problems Elevated LFTs, hyperbilirubinemia -improving  Cholelithiasis -needs eventual cholecystectomy, to be done as an outpatient  Leukocytosis - Due to above - improving   Hypertriglyceridemia  Dyslipidemia - Improved after insulin gtt. Hold statin with elevated LFT's  Attenuation of Splenic Vein - Discussed with radiology, low suspicion for thrombus, suspects this is due to inflammation/narrowing   Insulin-dependent type 2 diabetes, hypoglycemia - Blood sugars are acceptable now.   Insomnia - trazodone, increased dose today   Chronic anxiety depression -Currently on Wellbutrin.  Will continue.  Smoker -Counseled to quit.  Provide nicotine patch.  Hypocalcemia -resolved, corrects with albumin  Hypophosphatemia -resolved   Hypomagnesemia -resolved  Hypokalemia -resolved  Hyponatremia -mild, follow  Scheduled Meds:  buPROPion  150 mg Oral Daily   enoxaparin (LOVENOX) injection  40 mg Subcutaneous Q24H   feeding supplement  237 mL Oral BID BM   insulin aspart  0-5 Units Subcutaneous QHS   insulin aspart  0-9 Units Subcutaneous TID WC   insulin detemir  5 Units Subcutaneous BID   lipase/protease/amylase  36,000 Units Oral TID AC   multivitamin with minerals  1 tablet Oral Daily   nicotine  14 mg Transdermal Daily   pantoprazole  40 mg Oral Daily   polyethylene glycol  17 g Oral Daily   Ensure Max Protein  11 oz Oral BID   traZODone  100 mg Oral QHS   Continuous Infusions:  meropenem (MERREM) IV 1 g (02/14/23 0340)   PRN Meds:.baclofen, HYDROmorphone (DILAUDID) injection, labetalol, melatonin, mouth rinse, oxyCODONE **OR** oxyCODONE, polyethylene glycol, prochlorperazine  Current Outpatient Medications  Medication Instructions   atorvastatin (LIPITOR) 40 mg, Oral, Daily  Basaglar KwikPen 33 Units, Subcutaneous, Daily   buPROPion (WELLBUTRIN XL) 150 mg, Oral, Daily   cetirizine (ZYRTEC) 10 mg, Oral, Daily PRN   Continuous  Glucose Sensor (FREESTYLE LIBRE 3 SENSOR) MISC Place 1 sensor on the skin every 14 days. Use to check glucose continuously   insulin aspart (NOVOLOG FLEXPEN) 100 UNIT/ML FlexPen Inject 4 units with each meal. Additionally, add 3 units for each 40 points above 140.   Insulin Pen Needle (PEN NEEDLES) 32G X 4 MM MISC Use 1 pen needle to inject insulin 4 times per day.   Multiple Vitamins-Minerals (MULTIVITAMIN WITH MINERALS) tablet 1 tablet, Oral, Daily   sertraline (ZOLOFT) 100 mg, Oral, Daily    Diet Orders (From admission, onward)     Start     Ordered   02/09/23 1147  DIET SOFT Room service appropriate? Yes; Fluid consistency: Thin  Diet effective now       Comments: Low fat diet  Question Answer Comment  Room service appropriate? Yes   Fluid consistency: Thin      02/09/23 1146            DVT prophylaxis: enoxaparin (LOVENOX) injection 40 mg Start: 02/02/23 1000   Lab Results  Component Value Date   PLT 351 02/14/2023      Code Status: Full Code  Family Communication: no family at bedside   Status is: Inpatient Remains inpatient appropriate because: severity of illness  Level of care: Progressive  Consultants:  GI Surgery  Objective: Vitals:   02/13/23 0613 02/13/23 1215 02/13/23 2146 02/14/23 0507  BP:  119/87 130/84 117/79  Pulse:  94 98 83  Resp:  19 16 16   Temp:  97.6 F (36.4 C) 100.2 F (37.9 C) 98.7 F (37.1 C)  TempSrc:  Oral Oral Oral  SpO2:  96% 98% 98%  Weight: 95.3 kg     Height:        Intake/Output Summary (Last 24 hours) at 02/14/2023 1047 Last data filed at 02/14/2023 0831 Gross per 24 hour  Intake 3132.73 ml  Output 3050 ml  Net 82.73 ml    Wt Readings from Last 3 Encounters:  02/13/23 95.3 kg  10/10/22 95.9 kg  10/01/22 96.3 kg    Examination:  Constitutional: NAD Eyes: lids and conjunctivae normal, no scleral icterus ENMT: mmm Neck: normal, supple Respiratory: clear to auscultation bilaterally, no wheezing, no  crackles. Normal respiratory effort.  Cardiovascular: Regular rate and rhythm, no murmurs / rubs / gallops. No LE edema. Abdomen: soft, no distention, no tenderness. Bowel sounds positive.   Data Reviewed: I have independently reviewed following labs and imaging studies   CBC Recent Labs  Lab 02/10/23 0350 02/11/23 0410 02/12/23 0430 02/13/23 0439 02/14/23 0419  WBC 13.5* 18.4* 13.9* 12.3* 9.4  HGB 12.4* 12.7* 11.4* 12.0* 10.7*  HCT 37.2* 38.7* 34.7* 37.9* 33.6*  PLT 246 302 307 427* 351  MCV 85.3 85.6 85.3 88.3 88.2  MCH 28.4 28.1 28.0 28.0 28.1  MCHC 33.3 32.8 32.9 31.7 31.8  RDW 13.9 14.2 14.2 14.4 14.3  LYMPHSABS 1.3 1.9 1.9 2.4 2.0  MONOABS 1.2* 1.4* 1.2* 0.8 0.7  EOSABS 0.1 0.1 0.1 0.2 0.2  BASOSABS 0.0 0.1 0.0 0.0 0.0     Recent Labs  Lab 02/09/23 0453 02/09/23 1845 02/10/23 0350 02/11/23 0410 02/12/23 0430 02/13/23 0439 02/14/23 0419  NA 131*  --  131* 131* 131*  --  132*  K 3.9  --  3.9 4.5 4.0  --  4.0  CL 96*  --  96* 93* 95*  --  95*  CO2 25  --  26 29 28   --  29  GLUCOSE 148*  --  199* 141* 132*  --  175*  BUN 11  --  8 12 12   --  11  CREATININE 0.71  --  0.58* 0.95 0.79  --  0.88  CALCIUM 8.1*  --  7.9* 8.2* 8.0*  --  8.1*  AST 83*  --  85* 62* 60*  --  52*  ALT 76*  --  76* 66* 54*  --  52*  ALKPHOS 97  --  112 134* 129*  --  107  BILITOT 10.0*  --  12.6* 7.8* 6.2*  --  3.1*  ALBUMIN 2.3*  --  2.3* 2.4* 2.2*  --  2.3*  MG 1.9  --  1.9 2.3 2.1 1.9 1.9  BNP  --  56.4  --  35.3  --   --   --      ------------------------------------------------------------------------------------------------------------------ Recent Labs    02/13/23 0439 02/14/23 0419  TRIG 189* 175*     Lab Results  Component Value Date   HGBA1C 10.8 (H) 02/01/2023   ------------------------------------------------------------------------------------------------------------------ No results for input(s): "TSH", "T4TOTAL", "T3FREE", "THYROIDAB" in the last 72  hours.  Invalid input(s): "FREET3"  Cardiac Enzymes No results for input(s): "CKMB", "TROPONINI", "MYOGLOBIN" in the last 168 hours.  Invalid input(s): "CK" ------------------------------------------------------------------------------------------------------------------    Component Value Date/Time   BNP 35.3 02/11/2023 0410    CBG: Recent Labs  Lab 02/13/23 0810 02/13/23 1136 02/13/23 1603 02/13/23 2109 02/14/23 0733  GLUCAP 154* 172* 196* 196* 161*     Recent Results (from the past 240 hour(s))  Culture, blood (Routine X 2) w Reflex to ID Panel     Status: None   Collection Time: 02/08/23  9:01 AM   Specimen: BLOOD  Result Value Ref Range Status   Specimen Description   Final    BLOOD BLOOD RIGHT ARM Performed at [facility], [address] [place], [place] [address]    Special Requests   Final    BOTTLES DRAWN AEROBIC AND ANAEROBIC Blood Culture adequate volume Performed at [facility], [address] [place], [place] [address]    Culture   Final    NO GROWTH 5 DAYS Performed at [facility], [address], [place], [place] [address]    Report Status 02/13/2023 FINAL  Final  Culture, blood (Routine X 2) w Reflex to ID Panel     Status: None   Collection Time: 02/08/23  9:09 AM   Specimen: BLOOD  Result Value Ref Range Status   Specimen Description   Final    BLOOD BLOOD RIGHT ARM Performed at [facility], [address], [place], [place] [address]    Special Requests   Final    BOTTLES DRAWN AEROBIC AND ANAEROBIC Blood Culture adequate volume Performed at [facility], [address], [place], [place] [address]    Culture   Final    NO GROWTH 5 DAYS Performed at [facility], [address] [place], [place] [address]    Report Status 02/13/2023 FINAL  Final     Radiology Studies: No results found.   Pamella Pert, MD, PhD [facility]  Between  7 am - 7 pm I am available, please contact me via Amion (for emergencies) or Securechat (non urgent messages)  Between 7  pm - 7 am I am not available, please contact night coverage MD/APP via Amion

## 2023-02-14 NOTE — Progress Notes (Addendum)
Pharmacy Antibiotic Note - Sign Off  Pharmacy has been assisting with dosing of Meropenem for necrotizing pancreatitis.  Today, 02/14/2023: - CrCl= >100 (Wells r= 0.88) - WBC down to 9.4 K/uL  Plan: Dosage remains stable at Meropenem 1gm IV every 8 hrs and need for further dosage adjustment appears unlikely at present.    Pharmacy will sign off at this time, following peripherally for culture results, dose adjustments, and length of therapy via external software.  Please reconsult if a change in clinical status warrants re-evaluation of dosage.  Tacy Learn, PharmD Clinical Pharmacist 02/14/2023 9:20 AM

## 2023-02-15 ENCOUNTER — Other Ambulatory Visit (HOSPITAL_BASED_OUTPATIENT_CLINIC_OR_DEPARTMENT_OTHER): Payer: Self-pay

## 2023-02-15 ENCOUNTER — Other Ambulatory Visit: Payer: Self-pay

## 2023-02-15 DIAGNOSIS — K859 Acute pancreatitis without necrosis or infection, unspecified: Secondary | ICD-10-CM | POA: Diagnosis not present

## 2023-02-15 LAB — COMPREHENSIVE METABOLIC PANEL
ALT: 59 U/L — ABNORMAL HIGH (ref 0–44)
AST: 53 U/L — ABNORMAL HIGH (ref 15–41)
Albumin: 2.6 g/dL — ABNORMAL LOW (ref 3.5–5.0)
Alkaline Phosphatase: 112 U/L (ref 38–126)
Anion gap: 8 (ref 5–15)
BUN: 14 mg/dL (ref 6–20)
CO2: 29 mmol/L (ref 22–32)
Calcium: 8.7 mg/dL — ABNORMAL LOW (ref 8.9–10.3)
Chloride: 96 mmol/L — ABNORMAL LOW (ref 98–111)
Creatinine, Ser: 0.94 mg/dL (ref 0.61–1.24)
GFR, Estimated: >60 mL/min (ref >60)
Glucose, Bld: 219 mg/dL — ABNORMAL HIGH (ref 70–99)
Potassium: 4.7 mmol/L (ref 3.5–5.1)
Sodium: 133 mmol/L — ABNORMAL LOW (ref 135–145)
Total Bilirubin: 2.8 mg/dL — ABNORMAL HIGH (ref 0.3–1.2)
Total Protein: 6.8 g/dL (ref 6.5–8.1)

## 2023-02-15 LAB — CBC
HCT: 36.9 % — ABNORMAL LOW (ref 39.0–52.0)
Hemoglobin: 11.7 g/dL — ABNORMAL LOW (ref 13.0–17.0)
MCH: 28.2 pg (ref 26.0–34.0)
MCHC: 31.7 g/dL (ref 30.0–36.0)
MCV: 88.9 fL (ref 80.0–100.0)
Platelets: 424 10*3/uL — ABNORMAL HIGH (ref 150–400)
RBC: 4.15 MIL/uL — ABNORMAL LOW (ref 4.22–5.81)
RDW: 14.2 % (ref 11.5–15.5)
WBC: 8.4 10*3/uL (ref 4.0–10.5)
nRBC: 0 % (ref 0.0–0.2)

## 2023-02-15 LAB — GLUCOSE, CAPILLARY
Glucose-Capillary: 219 mg/dL — ABNORMAL HIGH (ref 70–99)
Glucose-Capillary: 222 mg/dL — ABNORMAL HIGH (ref 70–99)

## 2023-02-15 LAB — MAGNESIUM: Magnesium: 1.9 mg/dL (ref 1.7–2.4)

## 2023-02-15 MED ORDER — PANTOPRAZOLE SODIUM 40 MG PO TBEC
40.0000 mg | DELAYED_RELEASE_TABLET | Freq: Every day | ORAL | 0 refills | Status: DC
  Filled 2023-02-15: qty 30, 30d supply, fill #0

## 2023-02-15 MED ORDER — OXYCODONE HCL 15 MG PO TABS
7.5000 mg | ORAL_TABLET | Freq: Four times a day (QID) | ORAL | 0 refills | Status: DC | PRN
  Filled 2023-02-15: qty 24, 6d supply, fill #0

## 2023-02-15 MED ORDER — AMOXICILLIN-POT CLAVULANATE 875-125 MG PO TABS
1.0000 | ORAL_TABLET | Freq: Two times a day (BID) | ORAL | 0 refills | Status: AC
  Filled 2023-02-15: qty 14, 7d supply, fill #0

## 2023-02-15 MED ORDER — FENOFIBRATE 48 MG PO TABS
48.0000 mg | ORAL_TABLET | Freq: Every day | ORAL | 0 refills | Status: DC
  Filled 2023-02-15: qty 30, 30d supply, fill #0

## 2023-02-15 NOTE — Plan of Care (Signed)
  Problem: Education: Goal: Knowledge of General Education information will improve Description: Including pain rating scale, medication(s)/side effects and non-pharmacologic comfort measures Outcome: Progressing   Problem: Pain Managment: Goal: General experience of comfort will improve Outcome: Progressing   Problem: Safety: Goal: Ability to remain free from injury will improve Outcome: Progressing   

## 2023-02-15 NOTE — Discharge Summary (Signed)
Physician Discharge Summary  George Howe NWG:[id number] DOB: Jan 24, 1986 DOA: 01/31/2023  PCP: [name], [name], MD  Admit date: 01/31/2023 Discharge date: 02/15/2023  Admitted From: home Disposition:  home  Recommendations for Outpatient Follow-up:  Follow up with PCP in 1-2 weeks  Home Health: none Equipment/Devices: none  Discharge Condition: stable CODE STATUS: Full code  HPI: Per admitting MD, George Howe is a 37 y.o. male with medical history significant for obesity, type 2 diabetes, hypertriglyceridemia on Lipitor 40 mg nightly, prior hypertriglyceridemia induced pancreatitis x 3, chronic anxiety/depression, who initially presented to [facility] ED with complaints of diffuse abdominal pain for the past few days, worse at the epigastric region and radiating to his back.  Associated with nausea and vomiting.  No reported subjective fevers or chills. In the ED, vital signs notable for elevated BP, lab studies remarkable for triglycerides greater than 5000, lipase 653, serum glucose 313, serum bicarb 20, anion gap 16.   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  Eventually improved and have remained stable.  He had a protracted and prolonged hospital course, GI consulted, but now he appears to be improving and tolerating a soft diet. He was able to be weaned off IV Dilaudid and now pain is controlled on oral oxycodone alone. Discussed with GI, there was concern about infected necrosis and he was started on IV antibiotics.  Discussed with ID, will convert to  Augmentin on discharge plan for 14 total days, with 7 days left at the time of discharge.  Surgery evaluated patient and recommending cholecystectomy as an outpatient several weeks down the road if he continues to improve   Active problems Elevated LFTs, hyperbilirubinemia -improving Cholelithiasis -needs eventual cholecystectomy, to be done as an outpatient Leukocytosis - Due to above - improving Hypertriglyceridemia  Dyslipidemia - Improved after insulin gtt. start fenofibrate and continue statin on discharge Attenuation of Splenic Vein - Discussed with radiology, low suspicion for thrombus, suspects this is due to inflammation/narrowing Insulin-dependent type 2 diabetes, hypoglycemia - Blood sugars are acceptable now.  Resume home medications on discharge Chronic anxiety depression -Currently on Wellbutrin.  Will continue. Smoker -Counseled to quit.  Provide nicotine patch. Hypocalcemia -resolved, corrects with albumin Hypophosphatemia -resolved  Hypomagnesemia -resolved Hypokalemia -resolved Hyponatremia -mild  Sepsis ruled out   Discharge Instructions  Discharge Instructions     AMB Referral to Advanced Lipid Disorders Clinic   Complete by: As directed    Recurrent complicated pancreatitis due to elevated TG   Internal Lipid Clinic Referral Scheduling  Internal lipid clinic referrals are providers within [facility], who wish to refer established patients for routine management (help in starting PCSK9 inhibitor therapy) or advanced therapies.  Internal MD referral criteria:              1. All patients with LDL>190 mg/dL  2. All patients with Triglycerides >500 mg/dL  3. Patients with suspected or confirmed heterozygous familial hyperlipidemia (HeFH) or homozygous familial hyperlipidemia (HoFH)  4. Patients with family history of suspicious for genetic dyslipidemia desiring genetic testing  5. Patients refractory to standard guideline based therapy  6. Patients with statin  intolerance (  failed 2 statins, one of which must be a high potency statin)  7. Patients who the provider desires to be seen by MD   Internal PharmD referral criteria:   1. Follow-up patients for medication management  2. Follow-up for compliance monitoring  3. Patients for drug education  4. Patients with statin intolerance  5. PCSK9 inhibitor education and prior authorization approvals  6. Patients with triglycerides <500 mg/dL  External Lipid Clinic Referral  External lipid clinic referrals are for providers outside of [facility], considered new clinic patients - automatically routed to MD schedule      Allergies as of 02/15/2023   No Known Allergies      Medication List     TAKE these medications    amoxicillin-clavulanate 875-125 MG tablet Commonly known as: AUGMENTIN Take 1 tablet by mouth 2 (two) times daily for 7 days.   atorvastatin 40 MG tablet Commonly known as: Lipitor Take 1 tablet (40 mg total) by mouth daily.   Basaglar KwikPen 100 UNIT/ML Inject 33 Units into the skin daily.   buPROPion 150 MG 24 hr tablet Commonly known as: Wellbutrin XL Take 1 tablet (150 mg total) by mouth daily.   cetirizine 10 MG tablet Commonly known as: ZYRTEC Take 1 tablet (10 mg total) by mouth daily as needed for allergies.   fenofibrate 48 MG tablet Commonly known as: Tricor Take 1 tablet (48 mg total) by mouth daily.   FreeStyle Libre 3 Sensor Misc Place 1 sensor on the skin every 14 days. Use to check glucose continuously   multivitamin with minerals tablet Take 1 tablet by mouth daily.   NovoLOG FlexPen 100 UNIT/ML FlexPen Generic drug: insulin aspart Inject 4 units with each meal. Additionally, add 3 units for each 40 points above 140.   oxyCODONE 15 MG immediate release tablet Commonly known as: ROXICODONE Take 0.5-1 tablets (7.5-15 mg total) by mouth every 6 (six) hours as needed for severe pain.   pantoprazole 40 MG tablet Commonly known as:  PROTONIX Take 1 tablet (40 mg total) by mouth daily.   sertraline 100 MG tablet Commonly known as: ZOLOFT Take 1 tablet (100 mg total) by mouth daily.   TechLite Pen Needles 32G X 4 MM Misc Generic drug: Insulin Pen Needle Use 1 pen needle to inject insulin 4 times per day.        Follow-up Information     Fritzi Mandes, MD. Schedule an appointment as soon as possible for a visit in 6 week(s).   Specialty: General Surgery Why: for follow up with a surgeon regarding your pancreatitis/gallstones. Contact information: [address] [place] [address] [phone]         Hilarie Fredrickson, MD Follow up on 05/20/2023.   Specialty: Gastroenterology Why: 11:20am (please arrive 10-15 minutes prior to appt) Contact information: [address] [place] [address] [phone]                 Consultations: GI  Procedures/Studies:  CT ABDOMEN PELVIS W CONTRAST  Result Date: 02/11/2023 CLINICAL DATA:  Abdominal pain, acute pancreatitis EXAM: CT ABDOMEN AND PELVIS WITH CONTRAST TECHNIQUE: Multidetector CT imaging of the abdomen and pelvis was performed using the standard protocol following bolus administration of intravenous contrast. RADIATION DOSE REDUCTION: This exam was performed according to the departmental dose-optimization program which includes automated exposure control, adjustment of the mA and/or kV according to patient size and/or use of iterative reconstruction technique. CONTRAST:  OMNIPAQUE IOHEXOL  300 MG/ML  SOLN COMPARISON:  MRI abdomen dated 02/07/2023. CT abdomen/pelvis dated 01/31/2023. FINDINGS: Lower chest: Trace left pleural effusion with minimal left basilar atelectasis. Hepatobiliary: Liver is within normal limits, noting mild steatosis focal fatty sparing along the gallbladder fossa. Tiny layering gallstones (series 2/image 38), without associated inflammatory changes. No intrahepatic or extrahepatic duct dilatation. Pancreas:  Severe acute pancreatitis with suspected progressive parenchymal necrosis along the distal pancreatic tail (series 2/image 36) and anterior pancreatic head/uncinate process (series 2/image 49). Associated progressive acute peripancreatic fluid/inflammatory changes without wall/rim to suggest walled-off necrosis (series 2/image 40). Spleen: Within normal limits, noting trace perisplenic fluid inferiorly (series 2/image 39). Adrenals/Urinary Tract: Adrenal glands are within normal limits. Kidneys are within normal limits.  No hydronephrosis. Bladder is within normal limits. Stomach/Bowel: Mild perigastric fluid and secondary wall thickening due to peripancreatic inflammatory changes. No evidence of bowel obstruction. Normal appendix (series 2/image 7). No colonic wall thickening or inflammatory changes. Vascular/Lymphatic: No evidence of abdominal aortic aneurysm. No suspicious abdominopelvic lymphadenopathy. Reproductive: Prostate is unremarkable. Other: In addition to the peripancreatic fluid, there is mild retroperitoneal/anterior pararenal fluid, left greater than right, and trace presacral fluid. Musculoskeletal: Mild degenerative changes of the lower thoracic spine. IMPRESSION: Severe acute pancreatitis with pancreatic necrosis and acute pancreatic fluid collections, as described above. Cholelithiasis, without associated inflammatory changes. Trace left pleural effusion with minimal left basilar atelectasis. Electronically Signed   By: Charline Bills M.D.   On: 02/11/2023 08:11   DG Chest Port 1 View  Result Date: 02/08/2023 CLINICAL DATA:  Pleural effusion. EXAM: PORTABLE CHEST 1 VIEW COMPARISON:  05/02/2021 FINDINGS: Cardiac and mediastinal contours are within normal limits. No focal pulmonary opacity. No pleural effusion or pneumothorax. No acute osseous abnormality. IMPRESSION: No acute cardiopulmonary process. Electronically Signed   By: Wiliam Ke M.D.   On: 02/08/2023 15:21   MR ABDOMEN MRCP W  WO CONTAST  Result Date: 02/07/2023 CLINICAL DATA:  Pancreatitis; gallbladder polyps versus stones on recent ultrasound. EXAM: MRI ABDOMEN WITHOUT AND WITH CONTRAST (INCLUDING MRCP) TECHNIQUE: Multiplanar multisequence MR imaging of the abdomen was performed both before and after the administration of intravenous contrast. Heavily T2-weighted images of the biliary and pancreatic ducts were obtained, and three-dimensional MRCP images were rendered by post processing. CONTRAST:  10mL GADAVIST GADOBUTROL 1 MMOL/ML IV SOLN COMPARISON:  CT scan 01/31/2023 and ultrasound 02/06/2023 FINDINGS: Despite efforts by the technologist and patient, motion artifact is present on today's exam and could not be eliminated. This reduces exam sensitivity and specificity. Lower chest: Up small bilateral pleural effusions with subsegmental atelectasis in the left lower lobe. Hepatobiliary: Mild steatosis in segments 5 and 8 of the liver. Faint dependent low signal in the gallbladder for example on image 23 series 3, suspicious for small gallstones. Motion artifact substantially obscures the MRCP images. There is tapering of the distal CBD in the vicinity of the pancreatic head probably related to extrinsic narrowing due to pancreatitis. I do not see a well-defined stone along the projected course of the common bile duct. Pancreas: Extensive pancreatic inflammation with extensive peripancreatic edema and peripancreatic acute fluid collections. The previously hypoenhancing lesion along the pancreatic tail currently demonstrates internal high precontrast T1 signal, lack of enhancement, and marginal low T2 signal suspicious for a collection of blood products and possible localized pancreatic necrosis. This measures 3.3 by 2.0 cm on image 22 series 3 There is a new region of suspected necrosis in the pancreatic head with lack of enhancement in a 3.7 by 2.1 cm  pancreatic head lesion which demonstrates some internal high T1 signal compatible  with blood products. Pancreatic abscess is a differential diagnostic consideration. Spleen:  Unremarkable Adrenals/Urinary Tract:  Unremarkable Stomach/Bowel: Unremarkable Vascular/Lymphatic: The splenic vein is not thought to be completely occluded although does seem to be attenuated, with some collateralization. Other:  Edema tracks along the paracolic gutters. Musculoskeletal: Unremarkable IMPRESSION: 1. Extensive pancreatic inflammation and acute peripancreatic fluid collections. Nonenhancing collections with proteinaceous and blood products in the pancreatic tail and separately in the pancreatic head, concerning for local pancreatic necrosis. No internal gas density to indicate gas forming abscesses at this time. 2. The splenic vein is not thought to be completely occluded although does seem to be attenuated, with some collateralization. 3. The MRCP images are substantially degraded by motion artifact, but there is tapering of the distal CBD in the vicinity of the pancreatic head probably related to extrinsic narrowing due to pancreatitis. No well-defined stone along the projected course of the common bile duct. 4. Suspected small gallstones dependently in the gallbladder, although poorly characterized. 5. Small bilateral pleural effusions with subsegmental atelectasis in the left lower lobe. 6. Mild steatosis in segments 5 and 8 of the liver. Electronically Signed   By: Gaylyn Rong M.D.   On: 02/07/2023 09:15   MR 3D Recon At Scanner  Result Date: 02/07/2023 CLINICAL DATA:  Pancreatitis; gallbladder polyps versus stones on recent ultrasound. EXAM: MRI ABDOMEN WITHOUT AND WITH CONTRAST (INCLUDING MRCP) TECHNIQUE: Multiplanar multisequence MR imaging of the abdomen was performed both before and after the administration of intravenous contrast. Heavily T2-weighted images of the biliary and pancreatic ducts were obtained, and three-dimensional MRCP images were rendered by post processing. CONTRAST:   10mL GADAVIST GADOBUTROL 1 MMOL/ML IV SOLN COMPARISON:  CT scan 01/31/2023 and ultrasound 02/06/2023 FINDINGS: Despite efforts by the technologist and patient, motion artifact is present on today's exam and could not be eliminated. This reduces exam sensitivity and specificity. Lower chest: Up small bilateral pleural effusions with subsegmental atelectasis in the left lower lobe. Hepatobiliary: Mild steatosis in segments 5 and 8 of the liver. Faint dependent low signal in the gallbladder for example on image 23 series 3, suspicious for small gallstones. Motion artifact substantially obscures the MRCP images. There is tapering of the distal CBD in the vicinity of the pancreatic head probably related to extrinsic narrowing due to pancreatitis. I do not see a well-defined stone along the projected course of the common bile duct. Pancreas: Extensive pancreatic inflammation with extensive peripancreatic edema and peripancreatic acute fluid collections. The previously hypoenhancing lesion along the pancreatic tail currently demonstrates internal high precontrast T1 signal, lack of enhancement, and marginal low T2 signal suspicious for a collection of blood products and possible localized pancreatic necrosis. This measures 3.3 by 2.0 cm on image 22 series 3 There is a new region of suspected necrosis in the pancreatic head with lack of enhancement in a 3.7 by 2.1 cm pancreatic head lesion which demonstrates some internal high T1 signal compatible with blood products. Pancreatic abscess is a differential diagnostic consideration. Spleen:  Unremarkable Adrenals/Urinary Tract:  Unremarkable Stomach/Bowel: Unremarkable Vascular/Lymphatic: The splenic vein is not thought to be completely occluded although does seem to be attenuated, with some collateralization. Other:  Edema tracks along the paracolic gutters. Musculoskeletal: Unremarkable IMPRESSION: 1. Extensive pancreatic inflammation and acute peripancreatic fluid  collections. Nonenhancing collections with proteinaceous and blood products in the pancreatic tail and separately in the pancreatic head, concerning for local pancreatic necrosis. No internal gas density  to indicate gas forming abscesses at this time. 2. The splenic vein is not thought to be completely occluded although does seem to be attenuated, with some collateralization. 3. The MRCP images are substantially degraded by motion artifact, but there is tapering of the distal CBD in the vicinity of the pancreatic head probably related to extrinsic narrowing due to pancreatitis. No well-defined stone along the projected course of the common bile duct. 4. Suspected small gallstones dependently in the gallbladder, although poorly characterized. 5. Small bilateral pleural effusions with subsegmental atelectasis in the left lower lobe. 6. Mild steatosis in segments 5 and 8 of the liver. Electronically Signed   By: Gaylyn Rong M.D.   On: 02/07/2023 09:15   US Abdomen Limited RUQ (LIVER/GB)  Result Date: 02/06/2023 CLINICAL DATA:  [id number] Elevated liver enzymes [id number] EXAM: ULTRASOUND ABDOMEN LIMITED RIGHT UPPER QUADRANT COMPARISON:  CT 01/31/2023 FINDINGS: Gallbladder: The gallbladder is mildly distended. There are 3 non-mobile foci noted along the gallbladder wall, largest measuring 4 mm, could be small polyps or stones. Trace intraluminal sludge. Negative sonographic Murphy sign. No wall thickening. Common bile duct: Diameter: 2.7 mm, normal.  No intrahepatic ductal dilation Liver: Diffusely increased liver echogenicity. Portal vein is patent on color Doppler imaging with normal direction of blood flow towards the liver. Other: None. IMPRESSION: Diffuse hepatic steatosis. Three non-mobile echogenic foci, could be small polyps or stones, largest measuring 4 mm. No evidence of acute cholecystitis or biliary obstruction. Electronically Signed   By: Caprice Renshaw M.D.   On: 02/06/2023 13:11   CT ABDOMEN PELVIS W  CONTRAST  Result Date: 01/31/2023 CLINICAL DATA:  Pancreatitis, acute, severe. EXAM: CT ABDOMEN AND PELVIS WITH CONTRAST TECHNIQUE: Multidetector CT imaging of the abdomen and pelvis was performed using the standard protocol following bolus administration of intravenous contrast. RADIATION DOSE REDUCTION: This exam was performed according to the departmental dose-optimization program which includes automated exposure control, adjustment of the mA and/or kV according to patient size and/or use of iterative reconstruction technique. CONTRAST:  85mL OMNIPAQUE IOHEXOL 300 MG/ML  SOLN COMPARISON:  CT abdomen/pelvis 04/30/2022. FINDINGS: Lower chest: No acute abnormality. Hepatobiliary: Heterogeneous attenuation in the liver, suggestive of heterogeneous hepatic steatosis. Layering hyperdensity in the gallbladder neck may reflect sludge or small stones. No biliary dilatation. Pancreas: Moderate peripancreatic fat stranding. Focal hypoattenuation of the pancreatic tail, suspicious for necrosis (axial image 25 series 2). Spleen: Normal. Adrenals/Urinary Tract: Adrenal glands are unremarkable. Kidneys are normal, without renal calculi, focal lesion, or hydronephrosis. Bladder is unremarkable. Stomach/Bowel: Moderate fat stranding around the second and third portions of the duodenum. No dilated loops of small bowel. Normal appendix is visualized on axial image 59 series 2. No colon wall thickening or surrounding inflammation. Vascular/Lymphatic: Aortic atherosclerosis. No enlarged abdominal or pelvic lymph nodes. Reproductive: Prostate is unremarkable. Other: No abdominal wall hernia or abnormality. No abdominopelvic ascites. Musculoskeletal: No acute or significant osseous findings. IMPRESSION: 1. Moderate peripancreatic fat stranding, consistent with acute pancreatitis. Focal hypoattenuation of the pancreatic tail, suspicious for necrosis. 2. Heterogeneous attenuation of the liver, suggestive of heterogeneous hepatic  steatosis. 3. Layering hyperdensity in the gallbladder neck may reflect sludge or small stones. No biliary dilatation. 4. Aortic Atherosclerosis (ICD10-I70.0). Electronically Signed   By: Orvan Falconer M.D.   On: 01/31/2023 10:57     Subjective: - no chest pain, shortness of breath, no abdominal pain, nausea or vomiting.   Discharge Exam: BP 120/78 (BP Location: Left Arm)   Pulse 92   Temp 98.3 F (  36.8 C) (Oral)   Resp 18   Ht 5\' 4"  (1.626 m)   Wt 92.1 kg   SpO2 96%   BMI 34.85 kg/m   General: Pt is alert, awake, not in acute distress Cardiovascular: RRR, S1/S2 +, no rubs, no gallops Respiratory: CTA bilaterally, no wheezing, no rhonchi Abdominal: Soft, NT, ND, bowel sounds + Extremities: no edema, no cyanosis    The results of significant diagnostics from this hospitalization (including imaging, microbiology, ancillary and laboratory) are listed below for reference.     Microbiology: Recent Results (from the past 240 hour(s))  Culture, blood (Routine X 2) w Reflex to ID Panel     Status: None   Collection Time: 02/08/23  9:01 AM   Specimen: BLOOD  Result Value Ref Range Status   Specimen Description   Final    BLOOD BLOOD RIGHT ARM Performed at [facility], [address], [place], [place] [address]    Special Requests   Final    BOTTLES DRAWN AEROBIC AND ANAEROBIC Blood Culture adequate volume Performed at [facility], [address] [place], [place] [address]    Culture   Final    NO GROWTH 5 DAYS Performed at [facility], [address] [place], [place] [address]    Report Status 02/13/2023 FINAL  Final  Culture, blood (Routine X 2) w Reflex to ID Panel     Status: None   Collection Time: 02/08/23  9:09 AM   Specimen: BLOOD  Result Value Ref Range Status   Specimen Description   Final    BLOOD BLOOD RIGHT ARM Performed at [facility], [address], [place], [place] [address]     Special Requests   Final    BOTTLES DRAWN AEROBIC AND ANAEROBIC Blood Culture adequate volume Performed at [facility], [address], [place], [place] [address]    Culture   Final    NO GROWTH 5 DAYS Performed at [facility], [address], [place], [place] [address]    Report Status 02/13/2023 FINAL  Final     Labs: Basic Metabolic Panel: Recent Labs  Lab 02/10/23 0350 02/11/23 0410 02/12/23 0430 02/13/23 0439 02/14/23 0419 02/15/23 0429  NA 131* 131* 131*  --  132* 133*  K 3.9 4.5 4.0  --  4.0 4.7  CL 96* 93* 95*  --  95* 96*  CO2 26 29 28   --  29 29  GLUCOSE 199* 141* 132*  --  175* 219*  BUN 8 12 12   --  11 14  CREATININE 0.58* 0.95 0.79  --  0.88 0.94  CALCIUM 7.9* 8.2* 8.0*  --  8.1* 8.7*  MG 1.9 2.3 2.1 1.9 1.9 1.9  PHOS 2.9 3.5 3.1 3.5 3.7  --    Liver Function Tests: Recent Labs  Lab 02/10/23 0350 02/11/23 0410 02/12/23 0430 02/14/23 0419 02/15/23 0429  AST 85* 62* 60* 52* 53*  ALT 76* 66* 54* 52* 59*  ALKPHOS 112 134* 129* 107 112  BILITOT 12.6* 7.8* 6.2* 3.1* 2.8*  PROT 6.1* 6.6 6.1* 6.2* 6.8  ALBUMIN 2.3* 2.4* 2.2* 2.3* 2.6*   CBC: Recent Labs  Lab 02/10/23 0350 02/11/23 0410 02/12/23 0430 02/13/23 0439 02/14/23 0419 02/15/23 0429  WBC 13.5* 18.4* 13.9* 12.3* 9.4 8.4  NEUTROABS 10.8* 14.7* 10.4* 8.7* 6.5  --   HGB 12.4* 12.7* 11.4* 12.0* 10.7* 11.7*  HCT 37.2* 38.7* 34.7* 37.9* 33.6* 36.9*  MCV 85.3 85.6 85.3 88.3 88.2 88.9  PLT 246 302 307 427* 351 424*   CBG: Recent Labs  Lab 02/14/23 0733 02/14/23 1146 02/14/23 1647 02/14/23 2004 02/15/23 0734  GLUCAP 161* 197* 191* 269* 219*   Hgb A1c No results for input(s): "HGBA1C" in the last 72 hours. Lipid Profile Recent Labs    02/13/23 0439 02/14/23 0419  TRIG 189* 175*   Thyroid function studies No results for input(s): "TSH", "T4TOTAL", "T3FREE", "THYROIDAB" in the last 72 hours.  Invalid input(s): "FREET3" Urinalysis    Component Value  Date/Time   COLORURINE AMBER (A) 02/09/2023 1910   APPEARANCEUR CLEAR 02/09/2023 1910   LABSPEC 1.009 02/09/2023 1910   PHURINE 7.0 02/09/2023 1910   GLUCOSEU 50 (A) 02/09/2023 1910   HGBUR NEGATIVE 02/09/2023 1910   BILIRUBINUR SMALL (A) 02/09/2023 1910   KETONESUR NEGATIVE 02/09/2023 1910   PROTEINUR NEGATIVE 02/09/2023 1910   NITRITE NEGATIVE 02/09/2023 1910   LEUKOCYTESUR NEGATIVE 02/09/2023 1910    FURTHER DISCHARGE INSTRUCTIONS:   Get Medicines reviewed and adjusted: Please take all your medications with you for your next visit with your Primary MD   Laboratory/radiological data: Please request your Primary MD to go over all hospital tests and procedure/radiological results at the follow up, please ask your Primary MD to get all Hospital records sent to his/her office.   In some cases, they will be blood work, cultures and biopsy results pending at the time of your discharge. Please request that your primary care M.D. goes through all the records of your hospital data and follows up on these results.   Also Note the following: If you experience worsening of your admission symptoms, develop shortness of breath, life threatening emergency, suicidal or homicidal thoughts you must seek medical attention immediately by calling 911 or calling your MD immediately  if symptoms less severe.   You must read complete instructions/literature along with all the possible adverse reactions/side effects for all the Medicines you take and that have been prescribed to you. Take any new Medicines after you have completely understood and accpet all the possible adverse reactions/side effects.    Do not drive when taking Pain medications or sleeping medications (Benzodaizepines)   Do not take more than prescribed Pain, Sleep and Anxiety Medications. It is not advisable to combine anxiety,sleep and pain medications without talking with your primary care practitioner   Special Instructions: If you  have smoked or chewed Tobacco  in the last 2 yrs please stop smoking, stop any regular Alcohol  and or any Recreational drug use.   Wear Seat belts while driving.   Please note: You were cared for by a hospitalist during your hospital stay. Once you are discharged, your primary care physician will handle any further medical issues. Please note that NO REFILLS for any discharge medications will be authorized once you are discharged, as it is imperative that you return to your primary care physician (or establish a relationship with a primary care physician if you do not have one) for your post hospital discharge needs so that they can reassess your need for medications and monitor your lab values.  Time coordinating discharge: 35 minutes  SIGNED:  Pamella Pert, MD, PhD 02/15/2023, 9:16 AM

## 2023-02-16 ENCOUNTER — Other Ambulatory Visit (HOSPITAL_BASED_OUTPATIENT_CLINIC_OR_DEPARTMENT_OTHER): Payer: Self-pay

## 2023-02-19 ENCOUNTER — Telehealth: Payer: Self-pay

## 2023-02-19 NOTE — Transitions of Care (Post Inpatient/ED Visit) (Unsigned)
   02/19/2023  Name: George Howe MRN: [id number] DOB: 07-Dec-1985  Today's TOC FU Call Status: Today's TOC FU Call Status:: Unsuccessul Call (1st Attempt) Unsuccessful Call (1st Attempt) Date: 02/19/23  Attempted to reach the patient regarding the most recent Inpatient/ED visit.  Follow Up Plan: Additional outreach attempts will be made to reach the patient to complete the Transitions of Care (Post Inpatient/ED visit) call.   Signature Agnes Lawrence, CMA (AAMA)  [facility] [phone]

## 2023-02-20 ENCOUNTER — Emergency Department (HOSPITAL_COMMUNITY)
Admission: EM | Admit: 2023-02-20 | Discharge: 2023-02-21 | Disposition: A | Discharge status: Home / Self Care | Payer: 59 | Attending: Emergency Medicine | Admitting: Emergency Medicine

## 2023-02-20 ENCOUNTER — Encounter (HOSPITAL_COMMUNITY): Payer: Self-pay

## 2023-02-20 ENCOUNTER — Other Ambulatory Visit: Payer: Self-pay

## 2023-02-20 DIAGNOSIS — R739 Hyperglycemia, unspecified: Secondary | ICD-10-CM | POA: Insufficient documentation

## 2023-02-20 DIAGNOSIS — R1012 Left upper quadrant pain: Secondary | ICD-10-CM | POA: Insufficient documentation

## 2023-02-20 HISTORY — DX: Acute pancreatitis without necrosis or infection, unspecified: K85.90

## 2023-02-20 LAB — CBC
HCT: 40.5 % (ref 39.0–52.0)
Hemoglobin: 13.1 g/dL (ref 13.0–17.0)
MCH: 27.9 pg (ref 26.0–34.0)
MCHC: 32.3 g/dL (ref 30.0–36.0)
MCV: 86.4 fL (ref 80.0–100.0)
Platelets: 465 10*3/uL — ABNORMAL HIGH (ref 150–400)
RBC: 4.69 MIL/uL (ref 4.22–5.81)
RDW: 13.3 % (ref 11.5–15.5)
WBC: 8.6 10*3/uL (ref 4.0–10.5)
nRBC: 0 % (ref 0.0–0.2)

## 2023-02-20 LAB — COMPREHENSIVE METABOLIC PANEL
ALT: 47 U/L — ABNORMAL HIGH (ref 0–44)
AST: 37 U/L (ref 15–41)
Albumin: 3.6 g/dL (ref 3.5–5.0)
Alkaline Phosphatase: 95 U/L (ref 38–126)
Anion gap: 10 (ref 5–15)
BUN: 17 mg/dL (ref 6–20)
CO2: 25 mmol/L (ref 22–32)
Calcium: 9.2 mg/dL (ref 8.9–10.3)
Chloride: 99 mmol/L (ref 98–111)
Creatinine, Ser: 0.95 mg/dL (ref 0.61–1.24)
GFR, Estimated: >60 mL/min (ref >60)
Glucose, Bld: 262 mg/dL — ABNORMAL HIGH (ref 70–99)
Potassium: 3.8 mmol/L (ref 3.5–5.1)
Sodium: 134 mmol/L — ABNORMAL LOW (ref 135–145)
Total Bilirubin: 2.1 mg/dL — ABNORMAL HIGH (ref 0.3–1.2)
Total Protein: 8 g/dL (ref 6.5–8.1)

## 2023-02-20 LAB — LIPASE, BLOOD: Lipase: 61 U/L — ABNORMAL HIGH (ref 11–51)

## 2023-02-20 MED ORDER — ONDANSETRON HCL 4 MG/2ML IJ SOLN
4.0000 mg | Freq: Once | INTRAMUSCULAR | Status: AC
  Administered 2023-02-20: 4 mg via INTRAVENOUS
  Filled 2023-02-20: qty 2

## 2023-02-20 MED ORDER — HYDROMORPHONE HCL 1 MG/ML IJ SOLN
1.0000 mg | Freq: Once | INTRAMUSCULAR | Status: AC
  Administered 2023-02-20: 1 mg via INTRAVENOUS
  Filled 2023-02-20: qty 1

## 2023-02-20 MED ORDER — SODIUM CHLORIDE 0.9 % IV BOLUS
1000.0000 mL | Freq: Once | INTRAVENOUS | Status: AC
  Administered 2023-02-21: 1000 mL via INTRAVENOUS

## 2023-02-20 NOTE — ED Triage Notes (Signed)
Pt arrives c/o n/v and L sided abdominal pain x1 hour. Pt has had 2 episodes of emesis since symptoms developed.  Was hospitalized for pancreatitis for 15 days and discharged on 5/25. Discharged with augmentin, which he has been taking as prescribed.  Hx of recurrent pancreatitis with first episode 3 years ago. Was diagnosed with Type 2 IDDM at that time as well. Hx of pancreatic insufficiency.

## 2023-02-20 NOTE — ED Provider Notes (Signed)
[place] EMERGENCY DEPARTMENT AT [facility] Provider Note   CSN: [id number] Arrival date & time: 02/20/23  2254     History {Add pertinent medical, surgical, social history, OB history to HPI:1} Chief Complaint  Patient presents with   Abdominal Pain   Emesis    Halden Masias is a 37 y.o. male.  The history is provided by the patient and medical records. No language interpreter was used.  Abdominal Pain Associated symptoms: vomiting   Emesis Associated symptoms: abdominal pain      37 year old male with significant history of type 2 diabetes, obesity, hypertriglyceridemia, hyperglycemia induced pancreatitis who presents with complaint of abdominal pain.  Patient was recently admitted to the hospital for hypertriglyceridemia induced pancreatitis and was hospitalized for 15 days and was discharged on 5/25.  He was discharged home with Augmentin and have been taking as prescribed.  States that he felt better and resumed back to work today but through midday he became very nauseous, has vomited a few times and now having severe abdominal pain.  Pain is described as a sharp stabbing sensation to his left upper quadrant and similar to prior pancreatitis.  Rates pain as 8 out of 10.  Nausea seems to be improved with Zofran that was given earlier.  Denies any ongoing fever no urinary symptoms no chest pain or shortness of breath.  Denies alcohol abuse.  Home Medications Prior to Admission medications   Medication Sig Start Date End Date Taking? Authorizing Provider  amoxicillin-clavulanate (AUGMENTIN) 875-125 MG tablet Take 1 tablet by mouth 2 (two) times daily for 7 days. 02/15/23 02/22/23  Leatha Gilding, MD  atorvastatin (LIPITOR) 40 MG tablet Take 1 tablet (40 mg total) by mouth daily. 12/31/22   [name], [name], MD  buPROPion (WELLBUTRIN XL) 150 MG 24 hr tablet Take 1 tablet (150 mg total) by mouth daily. 10/01/22   [name], [name], MD  cetirizine  (ZYRTEC) 10 MG tablet Take 1 tablet (10 mg total) by mouth daily as needed for allergies. 12/19/21   [name], [name], MD  Continuous Glucose Sensor (FREESTYLE LIBRE 3 SENSOR) MISC Place 1 sensor on the skin every 14 days. Use to check glucose continuously 10/01/22   [name], [name], MD  fenofibrate (TRICOR) 48 MG tablet Take 1 tablet (48 mg total) by mouth daily. 02/15/23 02/15/24  Leatha Gilding, MD  insulin aspart (NOVOLOG FLEXPEN) 100 UNIT/ML FlexPen Inject 4 units with each meal. Additionally, add 3 units for each 40 points above 140. 10/10/22   [name], [name], MD  Insulin Glargine [facility]) 100 UNIT/ML Inject 33 Units into the skin daily. 10/29/22   [name], [name], MD  Insulin Pen Needle (PEN NEEDLES) 32G X 4 MM MISC Use 1 pen needle to inject insulin 4 times per day. 12/03/22   [name], [name], MD  Multiple Vitamins-Minerals (MULTIVITAMIN WITH MINERALS) tablet Take 1 tablet by mouth daily.    [provider]  oxyCODONE (ROXICODONE) 15 MG immediate release tablet Take 0.5-1 tablets (7.5-15 mg total) by mouth every 6 (six) hours as needed for severe pain. 02/15/23   Leatha Gilding, MD  pantoprazole (PROTONIX) 40 MG tablet Take 1 tablet (40 mg total) by mouth daily. 02/15/23   Leatha Gilding, MD  sertraline (ZOLOFT) 100 MG tablet Take 1 tablet (100 mg total) by mouth daily. 08/15/22   [name], [name], MD      Allergies  Patient has no known allergies.    Review of Systems   Review of Systems  Gastrointestinal:  Positive for abdominal pain and vomiting.  All other systems reviewed and are negative.   Physical Exam Updated Vital Signs BP (!) 137/92   Pulse 95   Temp 98.4 F (36.9 C) (Oral)   Resp 18   Ht 5\' 4"  (1.626 m)   Wt 92.1 kg   SpO2 96%   BMI 34.85 kg/m  Physical Exam Vitals and nursing note reviewed.  Constitutional:      General: He is not in acute distress.    Appearance: He is  well-developed.  HENT:     Head: Atraumatic.  Eyes:     Conjunctiva/sclera: Conjunctivae normal.  Cardiovascular:     Rate and Rhythm: Normal rate and regular rhythm.     Heart sounds: Normal heart sounds.  Pulmonary:     Effort: Pulmonary effort is normal.     Breath sounds: Normal breath sounds.  Abdominal:     Palpations: Abdomen is soft.     Tenderness: There is abdominal tenderness in the left upper quadrant. There is guarding. There is no rebound.  Musculoskeletal:     Cervical back: Neck supple.  Skin:    Findings: No rash.  Neurological:     Mental Status: He is alert.     ED Results / Procedures / Treatments   Labs (all labs ordered are listed, but only abnormal results are displayed) Labs Reviewed  LIPASE, BLOOD  COMPREHENSIVE METABOLIC PANEL  CBC  URINALYSIS, ROUTINE W REFLEX MICROSCOPIC    EKG None  Radiology No results found.  Procedures Procedures  {Document cardiac monitor, telemetry assessment procedure when appropriate:1}  Medications Ordered in ED Medications - No data to display  ED Course/ Medical Decision Making/ A&P   {   Click here for ABCD2, HEART and other calculatorsREFRESH Note before signing :1}                          Medical Decision Making Amount and/or Complexity of Data Reviewed Labs: ordered.   BP (!) 137/92   Pulse 95   Temp 98.4 F (36.9 C) (Oral)   Resp 18   Ht 5\' 4"  (1.626 m)   Wt 92.1 kg   SpO2 96%   BMI 34.85 kg/m   32:27 PM  37 year old male with significant history of type 2 diabetes, obesity, hypertriglyceridemia, hyperglycemia induced pancreatitis who presents with complaint of abdominal pain.  Patient was recently admitted to the hospital for hypertriglyceridemia induced pancreatitis and was hospitalized for 15 days and was discharged on 5/25.  He was discharged home with Augmentin and have been taking as prescribed.  States that he felt better and resumed back to work today but through midday he became  very nauseous, has vomited a few times and now having severe abdominal pain.  Pain is described as a sharp stabbing sensation to his left upper quadrant and similar to prior pancreatitis.  Rates pain as 8 out of 10.  Nausea seems to be improved with Zofran that was given earlier.  Denies any ongoing fever no urinary symptoms no chest pain or shortness of breath.  Denies alcohol abuse.  On exam, patient is laying in bed appears slightly uncomfortable but nontoxic in appearance.  Heart with normal rate and rhythm, lungs are clear to auscultation bilaterally abdomen is tender to the left upper quadrant with guarding but no rebound tenderness.  Vital signs overall reassuring.  -Labs ordered, independently viewed and interpreted by me.  Labs remarkable for *** -The patient was maintained on a cardiac monitor.  I personally viewed and interpreted the cardiac monitored which showed an underlying rhythm of: *** -Imaging independently viewed and interpreted by me and I agree with radiologist's interpretation.  Result remarkable for *** -This patient presents to the ED for concern of ***, this involves an extensive number of treatment options, and is a complaint that carries with it a high risk of complications and morbidity.  The differential diagnosis includes *** -Co morbidities that complicate the patient evaluation includes *** -Treatment includes *** -Reevaluation of the patient after these medicines showed that the patient {resolved/improved/worsened:23923::"improved"} -PCP office notes or outside notes reviewed -Discussion with specialist *** -Escalation to admission/observation considered: patients feels much better, is comfortable with discharge, and will follow up with PCP -Prescription medication considered, patient comfortable with *** -Social Determinant of Health considered which includes ***   {Document critical care time when appropriate:1} {Document review of labs and clinical decision  tools ie heart score, Chads2Vasc2 etc:1}  {Document your independent review of radiology images, and any outside records:1} {Document your discussion with family members, caretakers, and with consultants:1} {Document social determinants of health affecting pt's care:1} {Document your decision making why or why not admission, treatments were needed:1} Final Clinical Impression(s) / ED Diagnoses Final diagnoses:  None    Rx / DC Orders ED Discharge Orders     None

## 2023-02-20 NOTE — ED Notes (Signed)
Lab called to add on triglycerides at this time.

## 2023-02-21 ENCOUNTER — Other Ambulatory Visit: Payer: Self-pay

## 2023-02-21 ENCOUNTER — Other Ambulatory Visit (HOSPITAL_BASED_OUTPATIENT_CLINIC_OR_DEPARTMENT_OTHER): Payer: Self-pay

## 2023-02-21 ENCOUNTER — Encounter (HOSPITAL_COMMUNITY): Payer: Self-pay | Admitting: Emergency Medicine

## 2023-02-21 ENCOUNTER — Encounter: Payer: Self-pay | Admitting: Internal Medicine

## 2023-02-21 ENCOUNTER — Ambulatory Visit (INDEPENDENT_AMBULATORY_CARE_PROVIDER_SITE_OTHER): Payer: 59 | Admitting: Internal Medicine

## 2023-02-21 VITALS — BP 110/80 | HR 76 | Temp 98.2°F | Wt 203.7 lb

## 2023-02-21 DIAGNOSIS — K859 Acute pancreatitis without necrosis or infection, unspecified: Secondary | ICD-10-CM

## 2023-02-21 DIAGNOSIS — E1169 Type 2 diabetes mellitus with other specified complication: Secondary | ICD-10-CM | POA: Diagnosis not present

## 2023-02-21 DIAGNOSIS — E781 Pure hyperglyceridemia: Secondary | ICD-10-CM | POA: Diagnosis not present

## 2023-02-21 DIAGNOSIS — Z794 Long term (current) use of insulin: Secondary | ICD-10-CM

## 2023-02-21 DIAGNOSIS — K8020 Calculus of gallbladder without cholecystitis without obstruction: Secondary | ICD-10-CM

## 2023-02-21 DIAGNOSIS — Z09 Encounter for follow-up examination after completed treatment for conditions other than malignant neoplasm: Secondary | ICD-10-CM

## 2023-02-21 LAB — URINALYSIS, ROUTINE W REFLEX MICROSCOPIC
Bacteria, UA: NONE SEEN
Bilirubin Urine: NEGATIVE
Glucose, UA: >=500 mg/dL — AB
Hgb urine dipstick: NEGATIVE
Ketones, ur: NEGATIVE mg/dL
Leukocytes,Ua: NEGATIVE
Nitrite: NEGATIVE
Protein, ur: NEGATIVE mg/dL
Specific Gravity, Urine: 1.025 (ref 1.005–1.030)
pH: 5 (ref 5.0–8.0)

## 2023-02-21 LAB — TRIGLYCERIDES: Triglycerides: 194 mg/dL — ABNORMAL HIGH (ref <150)

## 2023-02-21 MED ORDER — ALUM & MAG HYDROXIDE-SIMETH 200-200-20 MG/5ML PO SUSP
30.0000 mL | Freq: Once | ORAL | Status: AC
  Administered 2023-02-21: 30 mL via ORAL
  Filled 2023-02-21: qty 30

## 2023-02-21 MED ORDER — OXYCODONE HCL 15 MG PO TABS
7.5000 mg | ORAL_TABLET | Freq: Four times a day (QID) | ORAL | 0 refills | Status: DC | PRN
Start: 2023-02-21 — End: 2023-03-15
  Filled 2023-02-21: qty 30, 8d supply, fill #0

## 2023-02-21 MED ORDER — CREON 3000-9500 UNITS PO CPEP
2.0000 | ORAL_CAPSULE | Freq: Three times a day (TID) | ORAL | 0 refills | Status: DC
Start: 2023-02-21 — End: 2023-03-15
  Filled 2023-02-21: qty 900, 150d supply, fill #0

## 2023-02-21 MED ORDER — SODIUM CHLORIDE 0.9 % IV BOLUS
1000.0000 mL | Freq: Once | INTRAVENOUS | Status: AC
  Administered 2023-02-21: 1000 mL via INTRAVENOUS

## 2023-02-21 MED ORDER — OXYCODONE HCL 5 MG PO TABS
10.0000 mg | ORAL_TABLET | Freq: Once | ORAL | Status: AC
  Administered 2023-02-21: 10 mg via ORAL
  Filled 2023-02-21: qty 2

## 2023-02-21 NOTE — Transitions of Care (Post Inpatient/ED Visit) (Signed)
   02/21/2023  Name: George Howe MRN: [id number] DOB: Jan 29, 1986  Today's TOC FU Call Status: Today's TOC FU Call Status:: Unsuccessul Call (1st Attempt) Unsuccessful Call (1st Attempt) Date: 02/19/23  Attempted to reach the patient regarding the most recent Inpatient/ED visit.  Follow Up Plan: No further outreach attempts will be made at this time. We have been unable to contact the patient. Patient was seen by PCP already.  Signature Agnes Lawrence, CMA (AAMA)  [facility] [phone]

## 2023-02-21 NOTE — ED Notes (Signed)
Pt tolerating PO challenge (Water) Pt denies n/v with intake.

## 2023-02-21 NOTE — ED Notes (Signed)
Pt advises being unable to void at this time for UA. Will continue to monitor.

## 2023-02-21 NOTE — Discharge Instructions (Addendum)
Please follow-up closely with your doctor in the next few hours as previously scheduled.  You may benefit from resuming your medication, Creon which will help your condition.  Do not hesitate to return to the ER if your condition worsen or if you have other concern.

## 2023-02-21 NOTE — Progress Notes (Signed)
Established Patient Office Visit     CC/Reason for Visit: Hospital discharge follow-up  HPI: George Howe is a 37 y.o. male who is coming in today for the above mentioned reasons. Past Medical History is significant for: Insulin-dependent diabetes, obesity, hypertriglyceridemia and depression.  He was admitted to the hospital from 5/9-5/24 due to acute necrotizing pancreatitis presumed secondary to hypertriglyceridemia.  On admission his triglyceride levels were greater than 5000.  On hospital discharge he was tolerating a soft, bland diet.  It was determined that he would need an outpatient cholecystectomy due to cholelithiasis without cholecystitis.  He was placed on fenofibrate for his triglycerides.  Yesterday he went back to work.  He unfortunately had increased abdominal pain and vomiting to the point where he went back to the emergency department.  He was sent home after pain medication and antiemetics due to reassuring vital signs and lab work.   Past Medical/Surgical History: Past Medical History:  Diagnosis Date   Chicken pox    Depression    Diabetes mellitus without complication (HCC)    DM (diabetes mellitus) (HCC)    Hay fever    Hyperlipidemia    Hyperlipidemia    Morbid obesity (HCC)    Pancreatitis    PONV (postoperative nausea and vomiting)     Past Surgical History:  Procedure Laterality Date   CARPAL TUNNEL RELEASE Right 10/18/2021   Procedure: Right CARPAL TUNNEL RELEASE;  Surgeon: Marlyne Beards, MD;  Location: [facility];  Service: Orthopedics;  Laterality: Right;   CARPAL TUNNEL RELEASE Left 11/01/2021   Procedure: Left CARPAL TUNNEL RELEASE;  Surgeon: Marlyne Beards, MD;  Location: [facility] OR;  Service: Orthopedics;  Laterality: Left;    Social History:  reports that he has been smoking cigarettes. He has a 8.50 pack-year smoking history. He has never used smokeless tobacco. He reports that he does not currently use alcohol. He reports  current drug use. Drug: Marijuana.  Allergies: No Known Allergies  Family History:  Family History  Problem Relation Age of Onset   Diabetes Father    Diabetes Paternal Grandmother      Current Outpatient Medications:    amoxicillin-clavulanate (AUGMENTIN) 875-125 MG tablet, Take 1 tablet by mouth 2 (two) times daily for 7 days., Disp: 14 tablet, Rfl: 0   atorvastatin (LIPITOR) 40 MG tablet, Take 1 tablet (40 mg total) by mouth daily., Disp: 90 tablet, Rfl: 1   buPROPion (WELLBUTRIN XL) 150 MG 24 hr tablet, Take 1 tablet (150 mg total) by mouth daily., Disp: 90 tablet, Rfl: 1   cetirizine (ZYRTEC) 10 MG tablet, Take 1 tablet (10 mg total) by mouth daily as needed for allergies., Disp: 90 tablet, Rfl: 0   Continuous Glucose Sensor (FREESTYLE LIBRE 3 SENSOR) MISC, Place 1 sensor on the skin every 14 days. Use to check glucose continuously, Disp: 2 each, Rfl: 3   fenofibrate (TRICOR) 48 MG tablet, Take 1 tablet (48 mg total) by mouth daily., Disp: 30 tablet, Rfl: 0   insulin aspart (NOVOLOG FLEXPEN) 100 UNIT/ML FlexPen, Inject 4 units with each meal. Additionally, add 3 units for each 40 points above 140., Disp: 12 mL, Rfl: 6   Insulin Glargine (BASAGLAR KWIKPEN) 100 UNIT/ML, Inject 33 Units into the skin daily., Disp: 15 mL, Rfl: 3   Insulin Pen Needle (PEN NEEDLES) 32G X 4 MM MISC, Use 1 pen needle to inject insulin 4 times per day., Disp: 200 each, Rfl: 11   Multiple Vitamins-Minerals (MULTIVITAMIN WITH  MINERALS) tablet, Take 1 tablet by mouth daily., Disp: , Rfl:    pantoprazole (PROTONIX) 40 MG tablet, Take 1 tablet (40 mg total) by mouth daily., Disp: 30 tablet, Rfl: 0   oxyCODONE (ROXICODONE) 15 MG immediate release tablet, Take 0.5-1 tablets (7.5-15 mg total) by mouth every 6 (six) hours as needed., Disp: 30 tablet, Rfl: 0   Pancrelipase, Lip-Prot-Amyl, (CREON) 3000-9500 units CPEP, Take 2 capsules (6,000 Units total) by mouth 3 (three) times daily before meals., Disp: 900 capsule,  Rfl: 0  Review of Systems:  Negative unless indicated in HPI.   Physical Exam: Vitals:   02/21/23 0942  BP: 110/80  Pulse: 76  Temp: 98.2 F (36.8 C)  TempSrc: Oral  SpO2: 98%  Weight: 203 lb 11.2 oz (92.4 kg)    Body mass index is 34.97 kg/m.   Physical Exam Vitals reviewed.  Constitutional:      Appearance: Normal appearance.  HENT:     Head: Normocephalic and atraumatic.  Eyes:     Conjunctiva/sclera: Conjunctivae normal.     Pupils: Pupils are equal, round, and reactive to light.  Cardiovascular:     Rate and Rhythm: Normal rate and regular rhythm.  Pulmonary:     Effort: Pulmonary effort is normal.     Breath sounds: Normal breath sounds.  Skin:    General: Skin is warm and dry.  Neurological:     General: No focal deficit present.     Mental Status: He is alert and oriented to person, place, and time.  Psychiatric:        Mood and Affect: Mood normal.        Behavior: Behavior normal.        Thought Content: Thought content normal.        Judgment: Judgment normal.      Impression and Plan:  Hospital discharge follow-up  Type 2 diabetes mellitus with other specified complication, with long-term current use of insulin (HCC) -     Microalbumin / creatinine urine ratio; Future  Acute pancreatitis, unspecified complication status, unspecified pancreatitis type -     oxyCODONE HCl; Take 0.5-1 tablets (7.5-15 mg total) by mouth every 6 (six) hours as needed.  Dispense: 30 tablet; Refill: 0 -     Creon; Take 2 capsules (6,000 Units total) by mouth 3 (three) times daily before meals.  Dispense: 900 capsule; Refill: 0  Hypertriglyceridemia  Morbid obesity (HCC)  Calculus of gallbladder without cholecystitis without obstruction -     Ambulatory referral to General Surgery   [facility] charts have been reviewed in great detail including admission from 5/9-5/24 and ED visit from 5/29. -He would like prescription of Creon which I will reup (unsure of  utility of this for acute pancreatitis).  He has follow-up with GI scheduled for August. -I will provide 30 tablets of Oxy IR to assist with abdominal pain that he is still having. -His sugars remain uncontrolled (I have reviewed his [place] data).  Increase Basaglar from 33 to 38 units at bedtime, continue NovoLog 3 times daily with meals 4 units.  Has a new patient appointment with endocrinology but this is not until November 6. -He is scheduled to see the lipid clinic on June 2 to discuss his hypertriglyceridemia and how this relates to his pancreatitis.  He has been taking fenofibrate since hospital discharge. -Placed referral to general surgery as per hospital notes he will need an outpatient cholecystectomy at some point. -He has 1 more day of Augmentin remaining.  Time spent:38 minutes reviewing chart, interviewing and examining patient and formulating plan of care.     Chaya Jan, MD [facility] at [facility]

## 2023-02-21 NOTE — ED Notes (Signed)
Discharge summary/instructions reviewed with pt. Provided pt with opportunity to ask any questions, pt had no further questions or concerns. After-Visit Summary provided to pt. 

## 2023-02-22 ENCOUNTER — Other Ambulatory Visit (HOSPITAL_COMMUNITY): Payer: Self-pay

## 2023-02-22 ENCOUNTER — Other Ambulatory Visit (HOSPITAL_BASED_OUTPATIENT_CLINIC_OR_DEPARTMENT_OTHER): Payer: Self-pay

## 2023-02-22 ENCOUNTER — Encounter (HOSPITAL_BASED_OUTPATIENT_CLINIC_OR_DEPARTMENT_OTHER): Payer: Self-pay

## 2023-02-24 ENCOUNTER — Other Ambulatory Visit (HOSPITAL_BASED_OUTPATIENT_CLINIC_OR_DEPARTMENT_OTHER): Payer: Self-pay

## 2023-03-03 ENCOUNTER — Other Ambulatory Visit (HOSPITAL_BASED_OUTPATIENT_CLINIC_OR_DEPARTMENT_OTHER): Payer: Self-pay

## 2023-03-07 ENCOUNTER — Other Ambulatory Visit (HOSPITAL_BASED_OUTPATIENT_CLINIC_OR_DEPARTMENT_OTHER): Payer: Self-pay

## 2023-03-07 ENCOUNTER — Encounter: Payer: Self-pay | Admitting: Internal Medicine

## 2023-03-08 ENCOUNTER — Other Ambulatory Visit (HOSPITAL_BASED_OUTPATIENT_CLINIC_OR_DEPARTMENT_OTHER): Payer: Self-pay

## 2023-03-10 ENCOUNTER — Other Ambulatory Visit (HOSPITAL_BASED_OUTPATIENT_CLINIC_OR_DEPARTMENT_OTHER): Payer: Self-pay

## 2023-03-11 ENCOUNTER — Other Ambulatory Visit (HOSPITAL_BASED_OUTPATIENT_CLINIC_OR_DEPARTMENT_OTHER): Payer: Self-pay

## 2023-03-11 ENCOUNTER — Other Ambulatory Visit: Payer: Self-pay | Admitting: Internal Medicine

## 2023-03-11 DIAGNOSIS — G4700 Insomnia, unspecified: Secondary | ICD-10-CM

## 2023-03-11 MED ORDER — TRAZODONE HCL 50 MG PO TABS
25.00 mg | ORAL_TABLET | Freq: Every evening | ORAL | 1 refills | Status: DC | PRN
Start: 2023-03-11 — End: 2023-04-02
  Filled 2023-03-11: qty 30, 30d supply, fill #0

## 2023-03-12 ENCOUNTER — Telehealth: Payer: Self-pay | Admitting: Internal Medicine

## 2023-03-12 ENCOUNTER — Other Ambulatory Visit (HOSPITAL_BASED_OUTPATIENT_CLINIC_OR_DEPARTMENT_OTHER): Payer: Self-pay

## 2023-03-12 MED ORDER — FENOFIBRATE 48 MG PO TABS
48.0000 mg | ORAL_TABLET | Freq: Every day | ORAL | 1 refills | Status: DC
  Filled 2023-03-12: qty 90, 90d supply, fill #0

## 2023-03-12 NOTE — Telephone Encounter (Signed)
Prescription Request  03/12/2023  LOV: 02/21/2023  What is the name of the medication or equipment? fenofibrate (TRICOR) 48 MG tablet  Have you contacted your pharmacy to request a refill? No   Which pharmacy would you like this sent to?  [facility] [address] [place] [address] Phone: [phone] Fax: [phone]    Patient notified that their request is being sent to the clinical staff for review and that they should receive a response within 2 business days.   Please advise at Mobile [phone] (mobile)

## 2023-03-15 ENCOUNTER — Encounter: Payer: Self-pay | Admitting: Pharmacist

## 2023-03-15 ENCOUNTER — Other Ambulatory Visit (HOSPITAL_BASED_OUTPATIENT_CLINIC_OR_DEPARTMENT_OTHER): Payer: Self-pay

## 2023-03-15 ENCOUNTER — Encounter (HOSPITAL_BASED_OUTPATIENT_CLINIC_OR_DEPARTMENT_OTHER): Payer: Self-pay

## 2023-03-15 ENCOUNTER — Ambulatory Visit: Payer: 59 | Attending: Cardiovascular Disease | Admitting: Pharmacist

## 2023-03-15 ENCOUNTER — Other Ambulatory Visit (HOSPITAL_COMMUNITY): Payer: Self-pay

## 2023-03-15 ENCOUNTER — Telehealth: Payer: Self-pay | Admitting: Pharmacist

## 2023-03-15 VITALS — Wt 202.6 lb

## 2023-03-15 DIAGNOSIS — E782 Mixed hyperlipidemia: Secondary | ICD-10-CM | POA: Diagnosis not present

## 2023-03-15 DIAGNOSIS — Z794 Long term (current) use of insulin: Secondary | ICD-10-CM | POA: Diagnosis not present

## 2023-03-15 DIAGNOSIS — E1165 Type 2 diabetes mellitus with hyperglycemia: Secondary | ICD-10-CM

## 2023-03-15 DIAGNOSIS — E781 Pure hyperglyceridemia: Secondary | ICD-10-CM | POA: Diagnosis not present

## 2023-03-15 MED ORDER — ATORVASTATIN CALCIUM 80 MG PO TABS
80.0000 mg | ORAL_TABLET | Freq: Every day | ORAL | 2 refills | Status: DC
Start: 2023-03-15 — End: 2023-05-02
  Filled 2023-03-15: qty 90, 90d supply, fill #0

## 2023-03-15 MED ORDER — BASAGLAR KWIKPEN 100 UNIT/ML ~~LOC~~ SOPN: 36.0000 [IU] | PEN_INJECTOR | Freq: Every day | SUBCUTANEOUS | 3 refills | Status: DC

## 2023-03-15 MED ORDER — ICOSAPENT ETHYL 1 G PO CAPS
ORAL_CAPSULE | ORAL | 5 refills | Status: DC
  Filled 2023-03-15: qty 120, 30d supply, fill #0

## 2023-03-15 NOTE — Patient Instructions (Addendum)
It was nice meeting you today  We would like your triglycerides to stay less than 150  Please increase your atorvastatin to 80mg  once a day  I will start a new medication called Vascepa which will be 2 capsules twice a day with food  Continue your fenofibrate 48mg  once a day  Try to get your fasting lipid panel the week of July 15th  Please message with any questions and I will see you back in a few weeks  Laural Golden, PharmD, BCACP, CDCES, CPP [address] [place], [place], [address] Phone: [phone], Fax: [phone]

## 2023-03-15 NOTE — Telephone Encounter (Signed)
Please complete PA for Vascepa 2 grams BID (or generic). Dx code hypertriglyceridemia plus diabetes

## 2023-03-15 NOTE — Progress Notes (Signed)
Patient ID: George Howe                 DOB: 10/16/1985                    MRN: [id number]     HPI: George Howe is a 37 y.o. 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 Patients PCP is Dr [name] and cardiologist is Dr Allyson Sabal.  PMH is significant for uncontrolled T2DM, HLD, and MDD.  Admitted for abdominal pain on 01/31/23. Discovered to have lipase of 653 and triglycerides >5000. Hospitalized for 15 days. Discharged on fenofibrate 48mg  daily. Remains on atorvastatin 40mg  daily.  Seen for hospital follow up by Dr [name] on 5/30 and insulin was titrated. Has endocrinology referral but can not be seen until November.  Patient is not a heavy alcohol drinker. Reports he and his roommate (who is a Advice worker at [employer]) do not have any alcohol in the house and they rarely go out to bars. He does not typically eat much processed/fatty foods.   Blood sugar has been uncontrolled however. Diagnosed with DM at 37 years old. Is now using blood glucose sensor and is more aware of his readings. Currently on basal and bolus insulins. No oral DM medications. Will likely be having cholecystectomy next month.  Tolerating atorvastatin 40mg . Has occasional leg soreness but does not hinder him. Works as Designer, multimedia at [employer].  Current Medications:  Atorvastatin 40mg  daily Fenofibrate 48mg  daily  LDL goal: <55 Triglyceride goal: <150   Past Medical History:  Diagnosis Date   Chicken pox    Depression    Diabetes mellitus without complication (HCC)    DM (diabetes mellitus) (HCC)    Hay fever    Hyperlipidemia    Hyperlipidemia    Morbid obesity (HCC)    Pancreatitis    PONV (postoperative nausea and vomiting)     Current Outpatient Medications on File Prior to Visit  Medication Sig Dispense Refill   atorvastatin (LIPITOR) 40 MG tablet Take 1 tablet (40 mg total) by mouth daily. 90 tablet 1    buPROPion (WELLBUTRIN XL) 150 MG 24 hr tablet Take 1 tablet (150 mg total) by mouth daily. 90 tablet 1   cetirizine (ZYRTEC) 10 MG tablet Take 1 tablet (10 mg total) by mouth daily as needed for allergies. 90 tablet 0   Continuous Glucose Sensor (FREESTYLE LIBRE 3 SENSOR) MISC Place 1 sensor on the skin every 14 days. Use to check glucose continuously 2 each 3   fenofibrate (TRICOR) 48 MG tablet Take 1 tablet (48 mg total) by mouth daily. 90 tablet 1   insulin aspart (NOVOLOG FLEXPEN) 100 UNIT/ML FlexPen Inject 4 units with each meal. Additionally, add 3 units for each 40 points above 140. 12 mL 6   Insulin Glargine (BASAGLAR KWIKPEN) 100 UNIT/ML Inject 33 Units into the skin daily. 15 mL 3   Insulin Pen Needle (PEN NEEDLES) 32G X 4 MM MISC Use 1 pen needle to inject insulin 4 times per day. 200 each 11   Multiple Vitamins-Minerals (MULTIVITAMIN WITH MINERALS) tablet Take 1 tablet by mouth daily.     oxyCODONE (ROXICODONE) 15 MG immediate release tablet Take 0.5-1 tablets (7.5-15 mg total) by mouth every 6 (six) hours as needed. 30 tablet 0   Pancrelipase, Lip-Prot-Amyl, (CREON) 3000-9500 units CPEP Take 2 capsules (6,000 Units total) by mouth 3 (three) times daily before meals. 900 capsule 0  pantoprazole (PROTONIX) 40 MG tablet Take 1 tablet (40 mg total) by mouth daily. 30 tablet 0   traZODone (DESYREL) 50 MG tablet Take 0.5-1 tablets (25-50 mg total) by mouth at bedtime as needed for sleep. 30 tablet 1   No current facility-administered medications on file prior to visit.    No Known Allergies  Assessment/Plan:  1. Hypertriglyceridemia - Patient's last triglyceride level 194 which is above goal of <150 however much improved since hospitalization. Emphasized importance of avoiding fatty and processed foods and importance of keeping tight glucose control.   Will increase atorvastatin to 80mg  once daily and add Vascepa 2g BID. Unknown if plan will cover. If denied, will increase fenofibrate.    Has PCP follow up in 1 month. Will order lipid panel in advance and see patient one week before hand to review glucose levels and discuss importance of diet changes. Referral placed for diabetic eye exam per patient request. Recheck in 1 month.  Increase atorvastatin to 80mg  daily Continue fenofibrate 48mg  daily Start Vascepa 2g BID Recheck lipid panel in 3-4 weeks Recheck LFTs in 3-4 weeks Follow up in 4 weeks  Laural Golden, PharmD, BCACP, CDCES, CPP [address], [address] [place], [place], [address] Phone: [phone], Fax: [phone]

## 2023-03-18 ENCOUNTER — Other Ambulatory Visit (HOSPITAL_BASED_OUTPATIENT_CLINIC_OR_DEPARTMENT_OTHER): Payer: Self-pay

## 2023-03-18 ENCOUNTER — Telehealth: Payer: Self-pay

## 2023-03-18 NOTE — Telephone Encounter (Signed)
Pharmacy Patient Advocate Encounter  Received notification from [facility] that the request for prior authorization for vascepa has been denied due to           .    KEY: [id number]

## 2023-03-20 ENCOUNTER — Telehealth: Payer: Self-pay

## 2023-03-20 ENCOUNTER — Other Ambulatory Visit (HOSPITAL_COMMUNITY): Payer: Self-pay

## 2023-03-20 NOTE — Telephone Encounter (Addendum)
Pharmacy Patient Advocate Encounter   Received notification from PharmD that prior authorization for LOVAZA is required/requested.   PA submitted to [facility] via CoverMyMeds Key or (Medicaid) confirmation # [id number]   Status is pending      Update (A new) P/A has been initiated for generic Lovaza  Key: [id number]

## 2023-03-20 NOTE — Telephone Encounter (Signed)
Please complete PA for Lovaza 2 grams twice daily (#120 per 30 days)

## 2023-03-22 ENCOUNTER — Encounter: Payer: Self-pay | Admitting: Pharmacist

## 2023-03-22 ENCOUNTER — Other Ambulatory Visit (HOSPITAL_BASED_OUTPATIENT_CLINIC_OR_DEPARTMENT_OTHER): Payer: Self-pay

## 2023-03-22 DIAGNOSIS — E1165 Type 2 diabetes mellitus with hyperglycemia: Secondary | ICD-10-CM

## 2023-03-22 DIAGNOSIS — E781 Pure hyperglyceridemia: Secondary | ICD-10-CM

## 2023-03-22 MED ORDER — OMEGA-3-ACID ETHYL ESTERS 1 G PO CAPS
2.0000 g | ORAL_CAPSULE | Freq: Two times a day (BID) | ORAL | 2 refills | Status: DC
Start: 2023-03-22 — End: 2023-05-01
  Filled 2023-03-22: qty 120, 30d supply, fill #0
  Filled 2023-04-19: qty 120, 30d supply, fill #1

## 2023-03-22 NOTE — Telephone Encounter (Signed)
Pharmacy Patient Advocate Encounter  Prior Authorization for Generic Lovaza/Omega-3-acid Ethyl Esters 1GM capsules has been APPROVED by [facility] from 6.27.24 to 6.26.25

## 2023-03-25 NOTE — Telephone Encounter (Signed)
Pharmacy Patient Advocate Encounter  Prior Authorization for Generic only/Omega-3-acid Ethyl Esters 1GM capsules has been APPROVED by [facility]  from 6.27.24 to 6.26.25.

## 2023-04-01 ENCOUNTER — Other Ambulatory Visit (HOSPITAL_BASED_OUTPATIENT_CLINIC_OR_DEPARTMENT_OTHER): Payer: Self-pay

## 2023-04-01 ENCOUNTER — Other Ambulatory Visit: Payer: Self-pay

## 2023-04-01 ENCOUNTER — Other Ambulatory Visit: Payer: Self-pay | Admitting: Internal Medicine

## 2023-04-01 DIAGNOSIS — E1169 Type 2 diabetes mellitus with other specified complication: Secondary | ICD-10-CM

## 2023-04-01 DIAGNOSIS — F17210 Nicotine dependence, cigarettes, uncomplicated: Secondary | ICD-10-CM

## 2023-04-01 MED ORDER — BUPROPION HCL ER (XL) 150 MG PO TB24
150.0000 mg | ORAL_TABLET | Freq: Every day | ORAL | 1 refills | Status: DC
Start: 2023-04-01 — End: 2023-10-26
  Filled 2023-04-01: qty 90, 90d supply, fill #0
  Filled 2023-07-06: qty 90, 90d supply, fill #1

## 2023-04-01 MED ORDER — FREESTYLE LIBRE 3 SENSOR MISC
3 refills | Status: DC
Start: 2023-04-01 — End: 2023-07-06
  Filled 2023-04-01: qty 2, 28d supply, fill #0
  Filled 2023-04-15: qty 2, 28d supply, fill #1
  Filled 2023-05-15: qty 2, 28d supply, fill #2
  Filled 2023-06-08: qty 2, 28d supply, fill #3

## 2023-04-02 ENCOUNTER — Encounter: Payer: Self-pay | Admitting: Internal Medicine

## 2023-04-02 ENCOUNTER — Ambulatory Visit (INDEPENDENT_AMBULATORY_CARE_PROVIDER_SITE_OTHER): Payer: 59 | Admitting: Internal Medicine

## 2023-04-02 ENCOUNTER — Other Ambulatory Visit (HOSPITAL_BASED_OUTPATIENT_CLINIC_OR_DEPARTMENT_OTHER): Payer: Self-pay

## 2023-04-02 VITALS — BP 120/84 | HR 95 | Temp 98.5°F | Wt 202.8 lb

## 2023-04-02 DIAGNOSIS — E782 Mixed hyperlipidemia: Secondary | ICD-10-CM | POA: Diagnosis not present

## 2023-04-02 DIAGNOSIS — Z794 Long term (current) use of insulin: Secondary | ICD-10-CM

## 2023-04-02 DIAGNOSIS — F17210 Nicotine dependence, cigarettes, uncomplicated: Secondary | ICD-10-CM | POA: Diagnosis not present

## 2023-04-02 DIAGNOSIS — E781 Pure hyperglyceridemia: Secondary | ICD-10-CM

## 2023-04-02 DIAGNOSIS — E1169 Type 2 diabetes mellitus with other specified complication: Secondary | ICD-10-CM

## 2023-04-02 LAB — POCT GLYCOSYLATED HEMOGLOBIN (HGB A1C): Hemoglobin A1C: 8 % — AB (ref 4.0–5.6)

## 2023-04-02 MED ORDER — BASAGLAR KWIKPEN 100 UNIT/ML ~~LOC~~ SOPN
36.0000 [IU] | PEN_INJECTOR | Freq: Every day | SUBCUTANEOUS | 3 refills | Status: DC
Start: 2023-04-02 — End: 2023-07-31
  Filled 2023-04-02 (×2): qty 15, 41d supply, fill #0
  Filled 2023-05-15: qty 15, 41d supply, fill #1
  Filled 2023-07-06: qty 15, 41d supply, fill #2
  Filled 2023-07-22: qty 15, 41d supply, fill #3

## 2023-04-02 MED ORDER — VARENICLINE TARTRATE (STARTER) 0.5 MG X 11 & 1 MG X 42 PO TBPK
ORAL_TABLET | ORAL | 0 refills | Status: DC
Start: 2023-04-02 — End: 2023-05-01
  Filled 2023-04-02: qty 53, 30d supply, fill #0

## 2023-04-02 MED ORDER — GVOKE HYPOPEN 2-PACK 0.5 MG/0.1ML ~~LOC~~ SOAJ
0.5000 mg | Freq: Once | SUBCUTANEOUS | 1 refills | Status: DC | PRN
Start: 2023-04-02 — End: 2023-04-15
  Filled 2023-04-02: qty 0.2, fill #0
  Filled 2023-04-02 – 2023-04-15 (×3): qty 0.2, 30d supply, fill #0

## 2023-04-02 NOTE — Assessment & Plan Note (Signed)
LDL at goal on atorvastatin. 

## 2023-04-02 NOTE — Assessment & Plan Note (Signed)
Discussed healthy lifestyle, including increased physical activity and better food choices to promote weight loss.  

## 2023-04-02 NOTE — Assessment & Plan Note (Signed)
Followed by cardiology. On statin and recently started on Lovaza.

## 2023-04-02 NOTE — Progress Notes (Signed)
Established Patient Office Visit     CC/Reason for Visit: Discuss blood sugar, requesting chantix  HPI: George Howe is a 37 y.o. male who is coming in today for the above mentioned reasons. Past Medical History is significant for: IDDM, HLD with hypertriglyceridemia, obesity. Currently on 36 units at bedtime of basaglar and 4 units TID novolog plus sliding scale with meals. He has had 2 hypoglycemic episodes. With one he was symptomatic with a glucose of 58. This has been concerning to him. His most recent A1c was 10.8. Now he has a freestyle libre and has been more cognizant of his glycemic control.He would also like to start chantix that he has used in the past successfully to quit smoking.   Past Medical/Surgical History: Past Medical History:  Diagnosis Date   Chicken pox    Depression    Diabetes mellitus without complication (HCC)    DM (diabetes mellitus) (HCC)    Hay fever    Hyperlipidemia    Hyperlipidemia    Morbid obesity (HCC)    Pancreatitis    PONV (postoperative nausea and vomiting)     Past Surgical History:  Procedure Laterality Date   CARPAL TUNNEL RELEASE Right 10/18/2021   Procedure: Right CARPAL TUNNEL RELEASE;  Surgeon: Marlyne Beards, MD;  Location: [facility];  Service: Orthopedics;  Laterality: Right;   CARPAL TUNNEL RELEASE Left 11/01/2021   Procedure: Left CARPAL TUNNEL RELEASE;  Surgeon: Marlyne Beards, MD;  Location: [facility] OR;  Service: Orthopedics;  Laterality: Left;    Social History:  reports that he has been smoking cigarettes. He has a 8.50 pack-year smoking history. He has never used smokeless tobacco. He reports that he does not currently use alcohol. He reports current drug use. Drug: Marijuana.  Allergies: No Known Allergies  Family History:  Family History  Problem Relation Age of Onset   Diabetes Father    Diabetes Paternal Grandmother      Current Outpatient Medications:    atorvastatin (LIPITOR) 80 MG  tablet, Take 1 tablet (80 mg total) by mouth daily., Disp: 90 tablet, Rfl: 2   buPROPion (WELLBUTRIN XL) 150 MG 24 hr tablet, Take 1 tablet (150 mg total) by mouth daily., Disp: 90 tablet, Rfl: 1   cetirizine (ZYRTEC) 10 MG tablet, Take 1 tablet (10 mg total) by mouth daily as needed for allergies., Disp: 90 tablet, Rfl: 0   Continuous Glucose Sensor (FREESTYLE LIBRE 3 SENSOR) MISC, Place 1 sensor on the skin every 14 days. Use to check glucose continuously, Disp: 2 each, Rfl: 3   fenofibrate (TRICOR) 48 MG tablet, Take 1 tablet (48 mg total) by mouth daily., Disp: 90 tablet, Rfl: 1   Glucagon (GVOKE HYPOPEN 2-PACK) 0.5 MG/0.1ML SOAJ, Inject 0.5 mg into the skin once as needed for up to 1 dose (hypoglycemia)., Disp: 0.2 mL, Rfl: 1   insulin aspart (NOVOLOG FLEXPEN) 100 UNIT/ML FlexPen, Inject 4 units with each meal. Additionally, add 3 units for each 40 points above 140., Disp: 12 mL, Rfl: 6   Insulin Pen Needle (PEN NEEDLES) 32G X 4 MM MISC, Use 1 pen needle to inject insulin 4 times per day., Disp: 200 each, Rfl: 11   Multiple Vitamins-Minerals (MULTIVITAMIN WITH MINERALS) tablet, Take 1 tablet by mouth daily., Disp: , Rfl:    omega-3 acid ethyl esters (LOVAZA) 1 g capsule, Take 2 capsules (2 g total) by mouth 2 (two) times daily., Disp: 120 capsule, Rfl: 2   Varenicline Tartrate, Starter, 0.5  MG X 11 & 1 MG X 42 TBPK, Take as directed, Disp: 53 each, Rfl: 0   Insulin Glargine (BASAGLAR KWIKPEN) 100 UNIT/ML, Inject 36 Units into the skin daily., Disp: 15 mL, Rfl: 3  Review of Systems:  Negative unless indicated in HPI.   Physical Exam: Vitals:   04/02/23 0951  BP: 120/84  Pulse: 95  Temp: 98.5 F (36.9 C)  TempSrc: Oral  SpO2: 97%  Weight: 202 lb 12.8 oz (92 kg)    Body mass index is 34.81 kg/m.   Physical Exam Vitals reviewed.  Constitutional:      Appearance: Normal appearance.  HENT:     Head: Normocephalic and atraumatic.  Eyes:     Conjunctiva/sclera: Conjunctivae  normal.     Pupils: Pupils are equal, round, and reactive to light.  Cardiovascular:     Rate and Rhythm: Normal rate and regular rhythm.  Pulmonary:     Effort: Pulmonary effort is normal.     Breath sounds: Normal breath sounds.  Skin:    General: Skin is warm and dry.  Neurological:     General: No focal deficit present.     Mental Status: He is alert and oriented to person, place, and time.  Psychiatric:        Mood and Affect: Mood normal.        Behavior: Behavior normal.        Thought Content: Thought content normal.        Judgment: Judgment normal.      Impression and Plan:  Type 2 diabetes mellitus with other specified complication, with long-term current use of insulin (HCC) Assessment & Plan: A1c down to 8.0. Only has had 2 hypoglycemic episodes. No change to insulin regimen today. Has appt to establish care with endocrinology in November. He is interested in an insulin pump.Will prescribe a glucagon pen for emergencies.  Orders: -     POCT glycosylated hemoglobin (Hb A1C) -     Microalbumin / creatinine urine ratio; Future -     Therapist, nutritional; Inject 36 Units into the skin daily.  Dispense: 15 mL; Refill: 3 -     Gvoke HypoPen 2-Pack; Inject 0.5 mg into the skin once as needed for up to 1 dose (hypoglycemia).  Dispense: 0.2 mL; Refill: 1  Cigarette nicotine dependence without complication Assessment & Plan: Down to 0.5 PPD on wellbutrin. Interested in pursuing chantix. He tried in the past with good success. Will prescribe.  Orders: -     Varenicline Tartrate (Starter); Take as directed  Dispense: 53 each; Refill: 0  Mixed hyperlipidemia Assessment & Plan: LDL at goal on atorvastatin.   Hypertriglyceridemia Assessment & Plan: Followed by cardiology. On statin and recently started on Lovaza.   Morbid obesity (HCC) Assessment & Plan: -Discussed healthy lifestyle, including increased physical activity and better food choices to promote weight  loss.       Time spent:30 minutes reviewing chart, interviewing and examining patient and formulating plan of care.     Chaya Jan, MD  Primary Care at [facility]

## 2023-04-02 NOTE — Assessment & Plan Note (Addendum)
A1c down to 8.0. Only has had 2 hypoglycemic episodes. No change to insulin regimen today. Has appt to establish care with endocrinology in November. He is interested in an insulin pump.Will prescribe a glucagon pen for emergencies.

## 2023-04-02 NOTE — Assessment & Plan Note (Signed)
Down to 0.5 PPD on wellbutrin. Interested in pursuing chantix. He tried in the past with good success. Will prescribe.

## 2023-04-03 ENCOUNTER — Other Ambulatory Visit (HOSPITAL_BASED_OUTPATIENT_CLINIC_OR_DEPARTMENT_OTHER): Payer: Self-pay

## 2023-04-06 NOTE — Progress Notes (Addendum)
Patient ID: George Howe, male   DOB: January 03, 1986, 37 y.o.   MRN: [id number]  Chief Complaint: History of pancreatitis  History of Present Illness George Howe is a 37 y.o. male with history of prior episodes of pancreatitis, has been advised to have his gallbladder removed.  1 episode he had triglycerides greater than 5000, denies significant alcohol use.  His last CT scan showed some degree of complicated pancreatitis, this resolved without any type of intervention.  He was hospitalized for this.  He currently denies any right upper quadrant pain, fatty food intolerance.  Though he reports eating rather cleanly.  He had an ultrasound of his gallbladder which does not confirm the absence of cholelithiasis.  He currently has work that he hopes to avoid surgery until after this is completed on August 10th.   Past Medical History Past Medical History:  Diagnosis Date   Chicken pox    Depression    Diabetes mellitus without complication (HCC)    DM (diabetes mellitus) (HCC)    Hay fever    Hyperlipidemia    Hyperlipidemia    Morbid obesity (HCC)    Pancreatitis    PONV (postoperative nausea and vomiting)       Past Surgical History:  Procedure Laterality Date   CARPAL TUNNEL RELEASE Right 10/18/2021   Procedure: Right CARPAL TUNNEL RELEASE;  Surgeon: Marlyne Beards, MD;  Location: [facility];  Service: Orthopedics;  Laterality: Right;   CARPAL TUNNEL RELEASE Left 11/01/2021   Procedure: Left CARPAL TUNNEL RELEASE;  Surgeon: Marlyne Beards, MD;  Location: [facility] OR;  Service: Orthopedics;  Laterality: Left;    No Known Allergies  Current Outpatient Medications  Medication Sig Dispense Refill   atorvastatin (LIPITOR) 80 MG tablet Take 1 tablet (80 mg total) by mouth daily. 90 tablet 2   buPROPion (WELLBUTRIN XL) 150 MG 24 hr tablet Take 1 tablet (150 mg total) by mouth daily. 90 tablet 1   cetirizine (ZYRTEC) 10 MG tablet Take 1 tablet (10 mg total) by mouth daily as  needed for allergies. 90 tablet 0   Continuous Glucose Sensor (FREESTYLE LIBRE 3 SENSOR) MISC Place 1 sensor on the skin every 14 days. Use to check glucose continuously 2 each 3   fenofibrate (TRICOR) 48 MG tablet Take 1 tablet (48 mg total) by mouth daily. 90 tablet 1   Glucagon (GVOKE HYPOPEN 2-PACK) 0.5 MG/0.1ML SOAJ Inject 0.5 mg into the skin once as needed for up to 1 dose (hypoglycemia). 0.2 mL 1   insulin aspart (NOVOLOG FLEXPEN) 100 UNIT/ML FlexPen Inject 4 units with each meal. Additionally, add 3 units for each 40 points above 140. 12 mL 6   Insulin Glargine (BASAGLAR KWIKPEN) 100 UNIT/ML Inject 36 Units into the skin daily. 15 mL 3   Insulin Pen Needle (PEN NEEDLES) 32G X 4 MM MISC Use 1 pen needle to inject insulin 4 times per day. 200 each 11   Multiple Vitamins-Minerals (MULTIVITAMIN WITH MINERALS) tablet Take 1 tablet by mouth daily.     omega-3 acid ethyl esters (LOVAZA) 1 g capsule Take 2 capsules (2 g total) by mouth 2 (two) times daily. 120 capsule 2   Varenicline Tartrate, Starter, 0.5 MG X 11 & 1 MG X 42 TBPK Take as directed 53 each 0   No current facility-administered medications for this visit.    Family History Family History  Problem Relation Age of Onset   Diabetes Father    Diabetes Paternal Grandmother  Social History Social History   Tobacco Use   Smoking status: Every Day    Current packs/day: 0.50    Average packs/day: 0.5 packs/day for 17.0 years (8.5 ttl pk-yrs)    Types: Cigarettes    Passive exposure: Past   Smokeless tobacco: Never  Vaping Use   Vaping status: Never Used  Substance Use Topics   Alcohol use: Not Currently    Comment: occasional   Drug use: Yes    Types: Marijuana        Review of Systems  Constitutional: Negative.   HENT: Negative.    Eyes: Negative.   Respiratory: Negative.    Cardiovascular: Negative.   Gastrointestinal: Negative.   Genitourinary: Negative.   Skin: Negative.   Neurological: Negative.    Psychiatric/Behavioral: Negative.       Physical Exam Blood pressure (!) 135/90, pulse 80, temperature 98 F (36.7 C), height 5\' 3"  (1.6 m), weight 207 lb (93.9 kg), SpO2 97%. Last Weight  Most recent update: 04/09/2023  9:23 AM    Weight  93.9 kg (207 lb)             CONSTITUTIONAL: Well developed, and nourished, appropriately responsive and aware without distress.   EYES: Sclera non-icteric.   EARS, NOSE, MOUTH AND THROAT:  The oropharynx is clear. Oral mucosa is pink and moist.    Hearing is intact to voice.  NECK: Trachea is midline, and there is no jugular venous distension.  LYMPH NODES:  Lymph nodes in the neck are not appreciated. RESPIRATORY:  Lungs are clear, and breath sounds are equal bilaterally.  Normal respiratory effort without pathologic use of accessory muscles. CARDIOVASCULAR: Heart is regular in rate and rhythm.   Well perfused.  GI: The abdomen is  soft, nontender, and nondistended. There were no palpable masses.  I did not appreciate hepatosplenomegaly. MUSCULOSKELETAL:  Symmetrical muscle tone appreciated in all four extremities.    SKIN: Skin turgor is normal. No pathologic skin lesions appreciated.  NEUROLOGIC:  Motor and sensation appear grossly normal.  Cranial nerves are grossly without defect. PSYCH:  Alert and oriented to person, place and time. Affect is appropriate for situation.  Data Reviewed I have personally reviewed what is currently available of the patient's imaging, recent labs and medical records.   Labs:     Latest Ref Rng & Units 02/20/2023   11:20 PM 02/15/2023    4:29 AM 02/14/2023    4:19 AM  CBC  WBC 4.0 - 10.5 K/uL 8.6  8.4  9.4   Hemoglobin 13.0 - 17.0 g/dL 91.4  78.2  95.6   Hematocrit 39.0 - 52.0 % 40.5  36.9  33.6   Platelets 150 - 400 K/uL 465  424  351       Latest Ref Rng & Units 02/20/2023   11:20 PM 02/15/2023    4:29 AM 02/14/2023    4:19 AM  CMP  Glucose 70 - 99 mg/dL 213  086  578   BUN 6 - 20 mg/dL 17  14  11     Creatinine 0.61 - 1.24 mg/dL 4.69  6.29  5.28   Sodium 135 - 145 mmol/L 134  133  132   Potassium 3.5 - 5.1 mmol/L 3.8  4.7  4.0   Chloride 98 - 111 mmol/L 99  96  95   CO2 22 - 32 mmol/L 25  29  29    Calcium 8.9 - 10.3 mg/dL 9.2  8.7  8.1   Total Protein 6.5 -  8.1 g/dL 8.0  6.8  6.2   Total Bilirubin 0.3 - 1.2 mg/dL 2.1  2.8  3.1   Alkaline Phos 38 - 126 U/L 95  112  107   AST 15 - 41 U/L 37  53  52   ALT 0 - 44 U/L 47  59  52       Imaging: Radiological images reviewed:  CLINICAL DATA:  Abdominal pain, acute pancreatitis   EXAM: CT ABDOMEN AND PELVIS WITH CONTRAST   TECHNIQUE: Multidetector CT imaging of the abdomen and pelvis was performed using the standard protocol following bolus administration of intravenous contrast.   RADIATION DOSE REDUCTION: This exam was performed according to the departmental dose-optimization program which includes automated exposure control, adjustment of the mA and/or kV according to patient size and/or use of iterative reconstruction technique.   CONTRAST:  OMNIPAQUE IOHEXOL 300 MG/ML  SOLN   COMPARISON:  MRI abdomen dated 02/07/2023. CT abdomen/pelvis dated 01/31/2023.   FINDINGS: Lower chest: Trace left pleural effusion with minimal left basilar atelectasis.   Hepatobiliary: Liver is within normal limits, noting mild steatosis focal fatty sparing along the gallbladder fossa.   Tiny layering gallstones (series 2/image 38), without associated inflammatory changes. No intrahepatic or extrahepatic duct dilatation.   Pancreas: Severe acute pancreatitis with suspected progressive parenchymal necrosis along the distal pancreatic tail (series 2/image 36) and anterior pancreatic head/uncinate process (series 2/image 49). Associated progressive acute peripancreatic fluid/inflammatory changes without wall/rim to suggest walled-off necrosis (series 2/image 40).   Spleen: Within normal limits, noting trace perisplenic  fluid inferiorly (series 2/image 39).   Adrenals/Urinary Tract: Adrenal glands are within normal limits.   Kidneys are within normal limits.  No hydronephrosis.   Bladder is within normal limits.   Stomach/Bowel: Mild perigastric fluid and secondary wall thickening due to peripancreatic inflammatory changes.   No evidence of bowel obstruction.   Normal appendix (series 2/image 7).   No colonic wall thickening or inflammatory changes.   Vascular/Lymphatic: No evidence of abdominal aortic aneurysm.   No suspicious abdominopelvic lymphadenopathy.   Reproductive: Prostate is unremarkable.   Other: In addition to the peripancreatic fluid, there is mild retroperitoneal/anterior pararenal fluid, left greater than right, and trace presacral fluid.   Musculoskeletal: Mild degenerative changes of the lower thoracic spine.   IMPRESSION: Severe acute pancreatitis with pancreatic necrosis and acute pancreatic fluid collections, as described above.   Cholelithiasis, without associated inflammatory changes.   Trace left pleural effusion with minimal left basilar atelectasis.     Electronically Signed   By: Charline Bills M.D.   On: 02/11/2023 08:11  CLINICAL DATA:  [id number] Elevated liver enzymes [id number]   EXAM: ULTRASOUND ABDOMEN LIMITED RIGHT UPPER QUADRANT   COMPARISON:  CT 01/31/2023   FINDINGS: Gallbladder:   The gallbladder is mildly distended. There are 3 non-mobile foci noted along the gallbladder wall, largest measuring 4 mm, could be small polyps or stones. Trace intraluminal sludge. Negative sonographic Murphy sign. No wall thickening.   Common bile duct:   Diameter: 2.7 mm, normal.  No intrahepatic ductal dilation   Liver:   Diffusely increased liver echogenicity. Portal vein is patent on color Doppler imaging with normal direction of blood flow towards the liver.   Other: None.   IMPRESSION: Diffuse hepatic steatosis.   Three non-mobile  echogenic foci, could be small polyps or stones, largest measuring 4 mm.   No evidence of acute cholecystitis or biliary obstruction.     Electronically Signed   By: Gerilyn Pilgrim  Park Breed M.D.   On: 02/06/2023 13:11  Within last 24 hrs: No results found.  Assessment    History of acute pancreatitis, suspicion of contribution by gallbladder for recurrence. 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 We discussed robotic cholecystectomy.  This was discussed thoroughly.  Optimal plan is for robotic cholecystectomy utilizing ICG imaging. Risks and benefits have been discussed with the patient which include but are not limited to anesthesia, bleeding, infection, biliary ductal injury, resulting in leak or stenosis, other associated unanticipated injuries affiliated with laparoscopic surgery.   Reviewed that removing the gallbladder will only address the symptoms related to the gallbladder itself.  I believe there is the desire to proceed, accepting the risks with  understanding.  Questions elicited and answered to satisfaction.    No guarantees ever expressed or implied.   Face-to-face time spent with the patient and accompanying care providers(if present) was 40 minutes, with more than 50% of the time spent counseling, educating, and coordinating care of the patient.    These notes generated with voice recognition software. I apologize for typographical errors.  Campbell Lerner M.D., FACS 04/09/2023, 11:24 AM

## 2023-04-09 ENCOUNTER — Encounter: Payer: Self-pay | Admitting: Surgery

## 2023-04-09 ENCOUNTER — Ambulatory Visit (INDEPENDENT_AMBULATORY_CARE_PROVIDER_SITE_OTHER): Payer: 59 | Admitting: Surgery

## 2023-04-09 VITALS — BP 135/90 | HR 80 | Temp 98.0°F | Ht 63.0 in | Wt 207.0 lb

## 2023-04-09 DIAGNOSIS — R935 Abnormal findings on diagnostic imaging of other abdominal regions, including retroperitoneum: Secondary | ICD-10-CM | POA: Diagnosis not present

## 2023-04-09 DIAGNOSIS — R1013 Epigastric pain: Secondary | ICD-10-CM

## 2023-04-09 DIAGNOSIS — K8020 Calculus of gallbladder without cholecystitis without obstruction: Secondary | ICD-10-CM

## 2023-04-09 DIAGNOSIS — K859 Acute pancreatitis without necrosis or infection, unspecified: Secondary | ICD-10-CM | POA: Diagnosis not present

## 2023-04-09 NOTE — Patient Instructions (Addendum)
We will get you scheduled for an ultrasound and CT scan prior to surgery.   You are scheduled for an Ultrasound at  [facility] on 04/22/23. You will need to arrive at entrance A at 7:45 am. You will need to have nothing to eat after midnight the night prior.   You are scheduled for your CT scan at  [facility] on 04/29/23. You will need to arrive at entrance A at 7:45 am. You will need to have nothing to eat and only water for 4 hours prior.  You have requested to have your gallbladder removed. This will be done at [facility] with Dr. Claudine Mouton.  You will most likely be out of work 1-2 weeks for this surgery.  If you have FMLA or disability paperwork that needs filled out you may drop this off at our office or this can be faxed to [phone].  You will return after your post-op appointment with a lifting restriction for approximately 4 more weeks.  You will be able to eat anything you would like to following surgery. But, start by eating a bland diet and advance this as tolerated. The Gallbladder diet is below, please go as closely by this diet as possible prior to surgery to avoid any further attacks.  Please see the (blue)pre-care form that you have been given today. Our surgery scheduler will call you to verify surgery date and to go over information.   If you have any questions, please call our office.  Laparoscopic Cholecystectomy Laparoscopic cholecystectomy is surgery to remove the gallbladder. The gallbladder is located in the upper right part of the abdomen, behind the liver. It is a storage sac for bile, which is produced in the liver. Bile aids in the digestion and absorption of fats. Cholecystectomy is often done for inflammation of the gallbladder (cholecystitis). This condition is usually caused by a buildup of gallstones (cholelithiasis) in the gallbladder. Gallstones can block the flow of bile, and that can result in inflammation and pain. In severe cases,  emergency surgery may be required. If emergency surgery is not required, you will have time to prepare for the procedure. Laparoscopic surgery is an alternative to open surgery. Laparoscopic surgery has a shorter recovery time. Your common bile duct may also need to be examined during the procedure. If stones are found in the common bile duct, they may be removed. LET [facility] CARE PROVIDER KNOW ABOUT: Any allergies you have. All medicines you are taking, including vitamins, herbs, eye drops, creams, and over-the-counter medicines. Previous problems you or members of your family have had with the use of anesthetics. Any blood disorders you have. Previous surgeries you have had.  Any medical conditions you have. RISKS AND COMPLICATIONS Generally, this is a safe procedure. However, problems may occur, including: Infection. Bleeding. Allergic reactions to medicines. Damage to other structures or organs. A stone remaining in the common bile duct. A bile leak from the cyst duct that is clipped when your gallbladder is removed. The need to convert to open surgery, which requires a larger incision in the abdomen. This may be necessary if your surgeon thinks that it is not safe to continue with a laparoscopic procedure. BEFORE THE PROCEDURE Ask your health care provider about: Changing or stopping your regular medicines. This is especially important if you are taking diabetes medicines or blood thinners. Taking medicines such as aspirin and ibuprofen. These medicines can thin your blood. Do not take these medicines before your procedure if  your health care provider instructs you not to. Follow instructions from your health care provider about eating or drinking restrictions. Let your health care provider know if you develop a cold or an infection before surgery. Plan to have someone take you home after the procedure. Ask your health care provider how your surgical site will be marked or  identified. You may be given antibiotic medicine to help prevent infection. PROCEDURE To reduce your risk of infection: Your health care team will wash or sanitize their hands. Your skin will be washed with soap. An IV tube may be inserted into one of your veins. You will be given a medicine to make you fall asleep (general anesthetic). A breathing tube will be placed in your mouth. The surgeon will make several small cuts (incisions) in your abdomen. A thin, lighted tube (laparoscope) that has a tiny camera on the end will be inserted through one of the small incisions. The camera on the laparoscope will send a picture to a TV screen (monitor) in the operating room. This will give the surgeon a good view inside your abdomen. A gas will be pumped into your abdomen. This will expand your abdomen to give the surgeon more room to perform the surgery. Other tools that are needed for the procedure will be inserted through the other incisions. The gallbladder will be removed through one of the incisions. After your gallbladder has been removed, the incisions will be closed with stitches (sutures), staples, or skin glue. Your incisions may be covered with a bandage (dressing). The procedure may vary among health care providers and hospitals. AFTER THE PROCEDURE Your blood pressure, heart rate, breathing rate, and blood oxygen level will be monitored often until the medicines you were given have worn off. You will be given medicines as needed to control your pain.   This information is not intended to replace advice given to you by your health care provider. Make sure you discuss any questions you have with your health care provider.   Document Released: 09/10/2005 Document Revised: 06/01/2015 Document Reviewed: 04/22/2013 Elsevier Interactive Patient Education 2016 Else[employer] Inc[employer]   Low-Fat Diet for Gallbladder Conditions A low-fat diet can be helpful if you have pancreatitis or a gallbladder  condition. With these conditions, your pancreas and gallbladder have trouble digesting fats. A healthy eating plan with less fat will help rest your pancreas and gallbladder and reduce your symptoms. WHAT DO I NEED TO KNOW ABOUT THIS DIET? Eat a low-fat diet. Reduce your fat intake to less than 20-30% of your total daily calories. This is less than 50-60 g of fat per day. Remember that you need some fat in your diet. Ask your dietician what your daily goal should be. Choose nonfat and low-fat healthy foods. Look for the words "nonfat," "low fat," or "fat free." As a guide, look on the label and choose foods with less than 3 g of fat per serving. Eat only one serving. Avoid alcohol. Do not smoke. If you need help quitting, talk with your health care provider. Eat small frequent meals instead of three large heavy meals. WHAT FOODS CAN I EAT? Grains Include healthy grains and starches such as potatoes, wheat bread, fiber-rich cereal, and brown rice. Choose whole grain options whenever possible. In adults, whole grains should account for 45-65% of your daily calories.  Fruits and Vegetables Eat plenty of fruits and vegetables. Fresh fruits and vegetables add fiber to your diet. Meats and Other Protein Sources Eat lean meat such as  chicken and pork. Trim any fat off of meat before cooking it. Eggs, fish, and beans are other sources of protein. In adults, these foods should account for 10-35% of your daily calories. Dairy Choose low-fat milk and dairy options. Dairy includes fat and protein, as well as calcium.  Fats and Oils Limit high-fat foods such as fried foods, sweets, baked goods, sugary drinks.  Other Creamy sauces and condiments, such as mayonnaise, can add extra fat. Think about whether or not you need to use them, or use smaller amounts or low fat options. WHAT FOODS ARE NOT RECOMMENDED? High fat foods, such as: [employer]. Ice cream. [place] toast. Sweet rolls. Pizza. Cheese  bread. Foods covered with batter, butter, creamy sauces, or cheese. Fried foods. Sugary drinks and desserts. Foods that cause gas or bloating   This information is not intended to replace advice given to you by your health care provider. Make sure you discuss any questions you have with your health care provider.   Document Released: 09/15/2013 Document Reviewed: 09/15/2013 Elsevier Interactive Patient Education [employer]

## 2023-04-10 ENCOUNTER — Encounter: Payer: Self-pay | Admitting: Surgery

## 2023-04-10 ENCOUNTER — Telehealth: Payer: Self-pay | Admitting: Surgery

## 2023-04-10 ENCOUNTER — Ambulatory Visit: Payer: Self-pay | Admitting: Surgery

## 2023-04-10 DIAGNOSIS — K8020 Calculus of gallbladder without cholecystitis without obstruction: Secondary | ICD-10-CM

## 2023-04-10 NOTE — Telephone Encounter (Signed)
Patient has been advised of Pre-Admission date/time, and Surgery date at [facility].  Surgery Date: 05/10/23 Preadmission Testing Date: 05/01/23 (phone 1p-4p)  Patient has been made aware to call [phone], between 1-3:00pm the day before surgery, to find out what time to arrive for surgery.

## 2023-04-12 ENCOUNTER — Telehealth: Payer: Self-pay

## 2023-04-12 ENCOUNTER — Other Ambulatory Visit (HOSPITAL_COMMUNITY): Payer: Self-pay

## 2023-04-12 NOTE — Telephone Encounter (Signed)
Pharmacy Patient Advocate Encounter   Received notification from CoverMyMeds that prior authorization for Gvoke HypoPen 2-Pack 0.5MG /0.1ML auto-injectors is required/requested.   Insurance verification completed.   The patient is insured through [employer] [facility] .   Per test claim: PA submitted to [employer] [facility] via CoverMyMeds Key/confirmation #/EOC [id number] Status is pending

## 2023-04-15 ENCOUNTER — Other Ambulatory Visit: Payer: Self-pay | Admitting: Pharmacist

## 2023-04-15 ENCOUNTER — Other Ambulatory Visit: Payer: Self-pay | Admitting: Internal Medicine

## 2023-04-15 ENCOUNTER — Ambulatory Visit: Payer: 59 | Attending: Cardiology | Admitting: Pharmacist

## 2023-04-15 ENCOUNTER — Encounter: Payer: Self-pay | Admitting: Pharmacist

## 2023-04-15 ENCOUNTER — Other Ambulatory Visit (HOSPITAL_BASED_OUTPATIENT_CLINIC_OR_DEPARTMENT_OTHER): Payer: Self-pay

## 2023-04-15 VITALS — Wt 204.0 lb

## 2023-04-15 DIAGNOSIS — Z794 Long term (current) use of insulin: Secondary | ICD-10-CM | POA: Diagnosis not present

## 2023-04-15 DIAGNOSIS — E781 Pure hyperglyceridemia: Secondary | ICD-10-CM | POA: Diagnosis not present

## 2023-04-15 DIAGNOSIS — E1165 Type 2 diabetes mellitus with hyperglycemia: Secondary | ICD-10-CM

## 2023-04-15 DIAGNOSIS — E782 Mixed hyperlipidemia: Secondary | ICD-10-CM | POA: Diagnosis not present

## 2023-04-15 NOTE — Progress Notes (Signed)
Patient ID: Juel Bellerose                 DOB: [birth year]                    MRN: [id number]     HPI: Dessie Delcarlo is a 37 y.o. 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 Patients PCP is Dr [name] and cardiologist is Dr Allyson Sabal.  PMH is significant for uncontrolled T2DM, HLD, and MDD.  Admitted for abdominal pain on 01/31/23. Discovered to have lipase of 653 and triglycerides >5000. Hospitalized for 15 days. Discharged on fenofibrate 48mg  daily. Remains on atorvastatin 40mg  daily.  Patient is not a heavy alcohol drinker. Reports he and his roommate (who is a Advice worker at [employer]) do not have any alcohol in the house and they rarely go out to bars. He does not typically eat much processed/fatty foods.   Blood sugar has been uncontrolled however. Diagnosed with DM at 37 years old. Is now using blood glucose sensor and is more aware of his readings. Currently on basal and bolus insulins. No oral DM medications. Will likely be having cholecystectomy next month.  Atorvastatin increased to 80mg  at last visit and patient was attempted to be started on Vascepa 2g BID. Insurance denied it however as he needs to use Lovaza 2g for 3 months first. Reports burping with Lovaza but he is tolerating.  Attempted to have lab work drawn last week however computers were down. Will update today.  Reports he is requiring more insulin than usual and AM readings have been consistently elevated, usually greater than 250. Injects his Basaglar around 10pm and last meal in the evening is approximately 7-8pm.  Has been working on improving diet and is focusing on salads and chicken.  Had an episode of hypoglycemia that was challenging to resolve. Drank two 20 ounce sodas before glucose began to increase. Was prescribed glucagon but PA has not been approved yet. Has not been started on metformin. He believes he is waiting to see endo.  LFTs have been  consistently elevated.  Current Medications:  Atorvastatin 80mg  daily Fenofibrate 48mg  daily Lovaza 2g BID  LDL goal: <55 Triglyceride goal: <150   Past Medical History:  Diagnosis Date   Chicken pox    Depression    Diabetes mellitus without complication (HCC)    DM (diabetes mellitus) (HCC)    Hay fever    Hyperlipidemia    Hyperlipidemia    Morbid obesity (HCC)    Pancreatitis    PONV (postoperative nausea and vomiting)     Current Outpatient Medications on File Prior to Visit  Medication Sig Dispense Refill   atorvastatin (LIPITOR) 40 MG tablet Take 1 tablet (40 mg total) by mouth daily. 90 tablet 1   buPROPion (WELLBUTRIN XL) 150 MG 24 hr tablet Take 1 tablet (150 mg total) by mouth daily. 90 tablet 1   cetirizine (ZYRTEC) 10 MG tablet Take 1 tablet (10 mg total) by mouth daily as needed for allergies. 90 tablet 0   Continuous Glucose Sensor (FREESTYLE LIBRE 3 SENSOR) MISC Place 1 sensor on the skin every 14 days. Use to check glucose continuously 2 each 3   fenofibrate (TRICOR) 48 MG tablet Take 1 tablet (48 mg total) by mouth daily. 90 tablet 1   insulin aspart (NOVOLOG FLEXPEN) 100 UNIT/ML FlexPen Inject 4 units with each meal. Additionally, add 3 units for each 40 points above 140. 12 mL 6  Insulin Glargine (BASAGLAR KWIKPEN) 100 UNIT/ML Inject 33 Units into the skin daily. 15 mL 3   Insulin Pen Needle (PEN NEEDLES) 32G X 4 MM MISC Use 1 pen needle to inject insulin 4 times per day. 200 each 11   Multiple Vitamins-Minerals (MULTIVITAMIN WITH MINERALS) tablet Take 1 tablet by mouth daily.     oxyCODONE (ROXICODONE) 15 MG immediate release tablet Take 0.5-1 tablets (7.5-15 mg total) by mouth every 6 (six) hours as needed. 30 tablet 0   Pancrelipase, Lip-Prot-Amyl, (CREON) 3000-9500 units CPEP Take 2 capsules (6,000 Units total) by mouth 3 (three) times daily before meals. 900 capsule 0   pantoprazole (PROTONIX) 40 MG tablet Take 1 tablet (40 mg total) by mouth daily. 30  tablet 0   traZODone (DESYREL) 50 MG tablet Take 0.5-1 tablets (25-50 mg total) by mouth at bedtime as needed for sleep. 30 tablet 1   No current facility-administered medications on file prior to visit.    No Known Allergies  Assessment/Plan:  1. Hypertriglyceridemia - Patient's last triglyceride level 194 which is above goal of <150 however much improved since hospitalization. Emphasized importance of avoiding fatty and processed foods and importance of keeping tight glucose control.   Will update lipid panel and CMP today. Consider increasing fenofibrate if triglycerides remain elevated. Consider d/c Lovaza if burping becomes too burdensome. Continue atorvastatin.  Due to patient's hypoglycemia event, discussed rules for treating and monitoring including the Rule of 15s. Suggested he may need a healthy snack before bed to suppress morning glucose elevations. Metformin may also benefit depending on his labs. Reports he has not heard regarding ophthalmology referral. Gave patient office phone number.   Gave patient [employer] handout and discussed importance of increasing vegetables, lean proteins, healthy fats, and whole grains.  Continue atorvastatin 80mg  daily Continue fenofibrate 48mg  daily Continue Lovaza 2g BID Check lipid panel today Check LFTs today  Laural Golden, PharmD, BCACP, CDCES, CPP [address] [place], [place], [address] Phone: [phone], Fax: [phone]

## 2023-04-15 NOTE — Patient Instructions (Addendum)
It was nice meeting you today  We would like your LDL (bad cholesterol) to be less than 70 and your triglycerides to be less than 150  Continue Atorvastatin 80mg , fenofibrate 48mg  and Lovaza 2 grams twice a day  We will update your lab work today and then decide on any adjustments  Continue to follow your low carb diet  Depending on the results I may reach out to Dr Loreta Ave about the metformin  Laural Golden, PharmD, BCACP, CDCES, CPP [address] [place], [place], [address] Phone: [phone], Fax: [phone]

## 2023-04-16 ENCOUNTER — Other Ambulatory Visit (HOSPITAL_BASED_OUTPATIENT_CLINIC_OR_DEPARTMENT_OTHER): Payer: Self-pay

## 2023-04-16 LAB — HEPATIC FUNCTION PANEL
ALT: 45 IU/L — ABNORMAL HIGH (ref 0–44)
AST: 28 IU/L (ref 0–40)
Albumin: 4.5 g/dL (ref 4.1–5.1)
Alkaline Phosphatase: 109 IU/L (ref 44–121)
Bilirubin Total: 1.2 mg/dL (ref 0.0–1.2)
Bilirubin, Direct: 0.34 mg/dL (ref 0.00–0.40)
Total Protein: 6.6 g/dL (ref 6.0–8.5)

## 2023-04-16 MED ORDER — GVOKE HYPOPEN 2-PACK 0.5 MG/0.1ML ~~LOC~~ SOAJ
0.5000 mg | Freq: Once | SUBCUTANEOUS | 1 refills | Status: DC | PRN
Start: 2023-04-16 — End: 2023-04-17
  Filled 2023-04-16: qty 0.2, fill #0
  Filled 2023-04-16: qty 0.2, 1d supply, fill #0

## 2023-04-16 NOTE — Telephone Encounter (Signed)
Pharmacy Patient Advocate Encounter  Received notification from  [employer]  that Prior Authorization for Gvoke HypoPen 2-Pack 0.5MG /0.1ML auto-injectors  has been DENIED. Please advise how you'd like to proceed. Full denial letter will be uploaded to the media tab. See denial reason below.  PA #/Case ID/Reference #: [id number]  Must show trial and failure or inability to use all the preferred medications that are covered which include Glucagon 1MG  emergency kit for low blood sugar and Insta-Glucose 77.4% gel.

## 2023-04-19 ENCOUNTER — Other Ambulatory Visit: Payer: Self-pay

## 2023-04-19 ENCOUNTER — Other Ambulatory Visit (HOSPITAL_BASED_OUTPATIENT_CLINIC_OR_DEPARTMENT_OTHER): Payer: Self-pay

## 2023-04-19 ENCOUNTER — Encounter: Payer: Self-pay | Admitting: Pharmacist

## 2023-04-19 DIAGNOSIS — E781 Pure hyperglyceridemia: Secondary | ICD-10-CM

## 2023-04-19 DIAGNOSIS — E782 Mixed hyperlipidemia: Secondary | ICD-10-CM

## 2023-04-19 MED ORDER — GLUCAGON 1 MG/0.2ML ~~LOC~~ SOAJ
SUBCUTANEOUS | 2 refills | Status: AC
  Filled 2023-04-19: qty 0.2, 1d supply, fill #0
  Filled 2023-04-23: qty 0.2, 30d supply, fill #0

## 2023-04-19 NOTE — Telephone Encounter (Signed)
Are the direction correct?

## 2023-04-22 ENCOUNTER — Ambulatory Visit: Payer: 59 | Admitting: Internal Medicine

## 2023-04-22 ENCOUNTER — Ambulatory Visit (HOSPITAL_COMMUNITY)
Admission: RE | Admit: 2023-04-22 | Discharge: 2023-04-22 | Disposition: A | Discharge status: Home / Self Care | Payer: 59 | Source: Ambulatory Visit | Attending: Surgery | Admitting: Surgery

## 2023-04-22 DIAGNOSIS — K8020 Calculus of gallbladder without cholecystitis without obstruction: Secondary | ICD-10-CM | POA: Insufficient documentation

## 2023-04-22 DIAGNOSIS — R1013 Epigastric pain: Secondary | ICD-10-CM | POA: Diagnosis present

## 2023-04-23 ENCOUNTER — Other Ambulatory Visit (HOSPITAL_BASED_OUTPATIENT_CLINIC_OR_DEPARTMENT_OTHER): Payer: Self-pay

## 2023-04-23 ENCOUNTER — Other Ambulatory Visit: Payer: Self-pay

## 2023-04-24 ENCOUNTER — Other Ambulatory Visit: Payer: Self-pay

## 2023-04-24 DIAGNOSIS — E782 Mixed hyperlipidemia: Secondary | ICD-10-CM

## 2023-04-24 DIAGNOSIS — E781 Pure hyperglyceridemia: Secondary | ICD-10-CM

## 2023-04-29 ENCOUNTER — Ambulatory Visit (HOSPITAL_COMMUNITY)
Admission: RE | Admit: 2023-04-29 | Discharge: 2023-04-29 | Disposition: A | Discharge status: Home / Self Care | Payer: 59 | Source: Ambulatory Visit | Attending: Surgery | Admitting: Surgery

## 2023-04-29 ENCOUNTER — Other Ambulatory Visit: Payer: 59

## 2023-04-29 DIAGNOSIS — K859 Acute pancreatitis without necrosis or infection, unspecified: Secondary | ICD-10-CM | POA: Diagnosis not present

## 2023-04-29 DIAGNOSIS — R1013 Epigastric pain: Secondary | ICD-10-CM | POA: Insufficient documentation

## 2023-04-29 DIAGNOSIS — R935 Abnormal findings on diagnostic imaging of other abdominal regions, including retroperitoneum: Secondary | ICD-10-CM | POA: Diagnosis present

## 2023-04-29 LAB — POCT I-STAT CREATININE: Creatinine, Ser: 1 mg/dL (ref 0.61–1.24)

## 2023-04-29 MED ORDER — IOHEXOL 350 MG/ML SOLN
75.0000 mL | Freq: Once | INTRAVENOUS | Status: AC | PRN
  Administered 2023-04-29: 75 mL via INTRAVENOUS

## 2023-04-30 ENCOUNTER — Telehealth: Payer: Self-pay | Admitting: Pharmacist

## 2023-04-30 DIAGNOSIS — E782 Mixed hyperlipidemia: Secondary | ICD-10-CM

## 2023-04-30 DIAGNOSIS — E781 Pure hyperglyceridemia: Secondary | ICD-10-CM

## 2023-04-30 DIAGNOSIS — K8020 Calculus of gallbladder without cholecystitis without obstruction: Secondary | ICD-10-CM

## 2023-04-30 NOTE — Telephone Encounter (Signed)
Called patient to discuss lipid panel and LFTs. LMOM

## 2023-05-01 ENCOUNTER — Encounter
Admission: RE | Admit: 2023-05-01 | Discharge: 2023-05-01 | Disposition: A | Discharge status: Home / Self Care | Payer: 59 | Source: Ambulatory Visit | Attending: Surgery | Admitting: Surgery

## 2023-05-01 VITALS — Ht 64.0 in | Wt 205.0 lb

## 2023-05-01 DIAGNOSIS — E1169 Type 2 diabetes mellitus with other specified complication: Secondary | ICD-10-CM

## 2023-05-01 DIAGNOSIS — E782 Mixed hyperlipidemia: Secondary | ICD-10-CM

## 2023-05-01 HISTORY — DX: Other specified abnormal findings of blood chemistry: R79.89

## 2023-05-01 HISTORY — DX: Cutaneous abscess of left axilla: L02.412

## 2023-05-01 HISTORY — DX: Calculus of gallbladder without cholecystitis without obstruction: K80.20

## 2023-05-01 HISTORY — DX: Abnormal findings on diagnostic imaging of other abdominal regions, including retroperitoneum: R93.5

## 2023-05-01 HISTORY — DX: Anxiety disorder, unspecified: F41.9

## 2023-05-01 HISTORY — DX: Nicotine dependence, cigarettes, uncomplicated: F17.210

## 2023-05-01 HISTORY — DX: Dizziness and giddiness: R42

## 2023-05-01 HISTORY — DX: Other specified diseases of pancreas: K86.89

## 2023-05-01 HISTORY — DX: Pure hyperglyceridemia: E78.1

## 2023-05-01 HISTORY — DX: Carpal tunnel syndrome, bilateral upper limbs: G56.03

## 2023-05-01 NOTE — Patient Instructions (Addendum)
Your procedure is scheduled on: Friday, August 16  Report to the Registration Desk on the 1st floor of the [facility] To find out your arrival time, please call [phone] between 1PM - 3PM on:  Thursday, August 15  If your arrival time is 6:00 am, do not arrive before that time as the [facility] entrance doors do not open until 6:00 am.  REMEMBER: Instructions that are not followed completely may result in serious medical risk, up to and including death; or upon the discretion of your surgeon and anesthesiologist your surgery may need to be rescheduled.  Do not eat food after midnight the night before surgery.  No gum chewing or hard candies.   One week prior to surgery: Stop Anti-inflammatories (NSAIDS) such as Advil, Aleve, Ibuprofen, Motrin, Naproxen, Naprosyn and Aspirin based products such as Excedrin, Goody's Powder, BC Powder.  Stop ANY OVER THE COUNTER supplements until after surgery Multiple Vitamins-Minerals (MULTIVITAMIN WITH MINERALS)  cetirizine (ZYRTEC)  hold the day of surgery , last dose Thursday , August 15  You may however, continue to take Tylenol if needed for pain up until the day of surgery.  Continue taking all prescribed medications with the exception of the following: omega-3 acid ethyl esters (LOVAZA)  **Follow guidelines for insulin and diabetes medications** Take half of your Insulin Glargine (BASAGLAR KWIKPEN) (18 units) the night prior to surgery and NO Insulin the morning of surgery     TAKE ONLY THESE MEDICATIONS THE MORNING OF SURGERY WITH A SIP OF WATER:   buPROPion (WELLBUTRIN XL)  fenofibrate (TRICOR)   Antacid (take one the night before and one on the morning of surgery - helps to prevent nausea after surgery.)  Use inhalers on the day of surgery and bring to the hospital.  No Alcohol for 24 hours before or after surgery.  No Smoking including e-cigarettes for 24 hours before surgery.  No chewable tobacco products for at least 6  hours before surgery.  No nicotine patches on the day of surgery.  Do not use any "recreational" drugs for at least a week (preferably 2 weeks) before your surgery.  Please be advised that the combination of cocaine and anesthesia may have negative outcomes, up to and including death. If you test positive for cocaine, your surgery will be cancelled.  On the morning of surgery brush your teeth with toothpaste and water, you may rinse your mouth with mouthwash if you wish. Do not swallow any toothpaste or mouthwash.  Use CHG Soap or as directed on instruction sheet.  Do not wear jewelry, make-up, hairpins, clips or nail polish.  Do not wear lotions, powders, or perfumes.   Do not shave body hair from the neck down 48 hours before surgery.  Do not bring valuables to the hospital. [facility] is not responsible for any missing/lost belongings or valuables.   Notify your doctor if there is any change in your medical condition (cold, fever, infection).  Wear comfortable clothing (specific to your surgery type) to the hospital.  After surgery, you can help prevent lung complications by doing breathing exercises.  Take deep breaths and cough every 1-2 hours. Your doctor may order a device called an Incentive Spirometer to help you take deep breaths. When coughing or sneezing, hold a pillow firmly against your incision with both hands. This is called "splinting." Doing this helps protect your incision. It also decreases belly discomfort.  If you are being discharged the day of surgery, you will not be allowed  to drive home. You will need a responsible individual to drive you home and stay with you for 24 hours after surgery.   If you are taking public transportation, you will need to have a responsible individual with you.  Please call the Pre-admissions Testing Dept. at [phone] if you have any questions about these instructions.  Surgery Visitation Policy:  Patients having surgery  or a procedure may have two visitors.  Children under the age of 33 must have an adult with them who is not the patient.             Preparing for Surgery with CHLORHEXIDINE GLUCONATE (CHG) Soap  Chlorhexidine Gluconate (CHG) Soap  o An antiseptic cleaner that kills germs and bonds with the skin to continue killing germs even after washing  o Used for showering the night before surgery and morning of surgery  Before surgery, you can play an important role by reducing the number of germs on your skin.  CHG (Chlorhexidine gluconate) soap is an antiseptic cleanser which kills germs and bonds with the skin to continue killing germs even after washing.  Please do not use if you have an allergy to CHG or antibacterial soaps. If your skin becomes reddened/irritated stop using the CHG.  1. Shower the NIGHT BEFORE SURGERY and the MORNING OF SURGERY with CHG soap.  2. If you choose to wash your hair, wash your hair first as usual with your normal shampoo.  3. After shampooing, rinse your hair and body thoroughly to remove the shampoo.  4. Use CHG as you would any other liquid soap. You can apply CHG directly to the skin and wash gently with a scrungie or a clean washcloth.  5. Apply the CHG soap to your body only from the neck down. Do not use on open wounds or open sores. Avoid contact with your eyes, ears, mouth, and genitals (private parts). Wash face and genitals (private parts) with your normal soap.  6. Wash thoroughly, paying special attention to the area where your surgery will be performed.  7. Thoroughly rinse your body with warm water.  8. Do not shower/wash with your normal soap after using and rinsing off the CHG soap.  9. Pat yourself dry with a clean towel.  10. Wear clean pajamas to bed the night before surgery.  12. Place clean sheets on your bed the night of your first shower and do not sleep with pets.  13. Shower again with the CHG soap on the day of surgery  prior to arriving at the hospital.  14. Do not apply any deodorants/lotions/powders.  15. Please wear clean clothes to the hospital.

## 2023-05-02 NOTE — Addendum Note (Signed)
Addended by: Cheree Ditto on: 05/02/2023 11:07 AM   Modules accepted: Orders

## 2023-05-02 NOTE — Telephone Encounter (Signed)
Called to discuss lipid panel and LFTs. LMOM

## 2023-05-02 NOTE — Telephone Encounter (Signed)
Patient called back. Discussed elevated LFTs. Unknown if it is due to atorvastatin/fenofibrate or gallstones. Will have patient hold for the time being until his procedure and then recheck LFTs at the end of the month. Patient voiced understanding.

## 2023-05-03 ENCOUNTER — Telehealth: Payer: Self-pay | Admitting: Surgery

## 2023-05-03 NOTE — Telephone Encounter (Signed)
Patient will follow up end of September and hopefully at that time will be able to reschedule surgery for October 2024.

## 2023-05-03 NOTE — Telephone Encounter (Signed)
Patient cancelling surgery for now 05/10/23 due to transportation issues.  He will come

## 2023-05-03 NOTE — Telephone Encounter (Signed)
Patient called earlier wanting to reschedule surgery for 8/16 due to transportation problems.   Called patient back to reschedule, no answer. Left message for him to call.

## 2023-05-06 ENCOUNTER — Other Ambulatory Visit: Payer: 59

## 2023-05-10 ENCOUNTER — Ambulatory Visit: Admission: RE | Admit: 2023-05-10 | Payer: 59 | Source: Home / Self Care | Admitting: Surgery

## 2023-05-10 ENCOUNTER — Other Ambulatory Visit: Payer: Self-pay

## 2023-05-10 ENCOUNTER — Encounter: Admission: RE | Payer: Self-pay | Source: Home / Self Care

## 2023-05-10 SURGERY — CHOLECYSTECTOMY, ROBOT-ASSISTED, LAPAROSCOPIC
Anesthesia: General

## 2023-05-14 ENCOUNTER — Encounter: Payer: Self-pay | Admitting: Internal Medicine

## 2023-05-14 DIAGNOSIS — E1310 Other specified diabetes mellitus with ketoacidosis without coma: Secondary | ICD-10-CM

## 2023-05-15 ENCOUNTER — Other Ambulatory Visit (HOSPITAL_BASED_OUTPATIENT_CLINIC_OR_DEPARTMENT_OTHER): Payer: Self-pay

## 2023-05-15 MED ORDER — NOVOLOG FLEXPEN 100 UNIT/ML ~~LOC~~ SOPN
PEN_INJECTOR | SUBCUTANEOUS | 6 refills | Status: DC
  Filled 2023-05-15: qty 6, 28d supply, fill #0
  Filled 2023-06-08: qty 6, 28d supply, fill #1
  Filled 2023-07-06: qty 6, 28d supply, fill #2
  Filled 2023-07-27 – 2023-07-29 (×2): qty 6, 28d supply, fill #3

## 2023-05-20 ENCOUNTER — Ambulatory Visit: Payer: 59 | Admitting: Internal Medicine

## 2023-05-21 ENCOUNTER — Other Ambulatory Visit (HOSPITAL_BASED_OUTPATIENT_CLINIC_OR_DEPARTMENT_OTHER): Payer: Self-pay

## 2023-06-10 ENCOUNTER — Other Ambulatory Visit (HOSPITAL_BASED_OUTPATIENT_CLINIC_OR_DEPARTMENT_OTHER): Payer: Self-pay

## 2023-06-10 ENCOUNTER — Other Ambulatory Visit: Payer: Self-pay

## 2023-06-17 ENCOUNTER — Other Ambulatory Visit: Payer: Self-pay

## 2023-06-17 DIAGNOSIS — E782 Mixed hyperlipidemia: Secondary | ICD-10-CM

## 2023-06-17 DIAGNOSIS — E781 Pure hyperglyceridemia: Secondary | ICD-10-CM

## 2023-06-17 DIAGNOSIS — K8020 Calculus of gallbladder without cholecystitis without obstruction: Secondary | ICD-10-CM

## 2023-06-17 LAB — HEPATIC FUNCTION PANEL
ALT: 47 IU/L — ABNORMAL HIGH (ref 0–44)
AST: 36 IU/L (ref 0–40)
Albumin: 4.4 g/dL (ref 4.1–5.1)
Alkaline Phosphatase: 114 IU/L (ref 44–121)
Bilirubin Total: 0.8 mg/dL (ref 0.0–1.2)
Bilirubin, Direct: 0.21 mg/dL (ref 0.00–0.40)
Total Protein: 6.6 g/dL (ref 6.0–8.5)

## 2023-06-19 NOTE — H&P (View-Only) (Signed)
Patient ID: George Howe, male   DOB: [birth year], 37 y.o.   MRN: [id number]  Chief Complaint: History of pancreatitis  History of Present Illness George Howe is a 37 y.o. male with history of prior episodes of pancreatitis, has been advised to have his gallbladder removed.  1 episode he had triglycerides greater than 5000, denies significant alcohol use.  His last CT scan showed some degree of complicated pancreatitis, this resolved without any type of intervention.  He was hospitalized for this.  He currently denies any right upper quadrant pain, fatty food intolerance.  Though he reports eating rather cleanly.  He had an ultrasound of his gallbladder which does not confirm the absence of cholelithiasis.  He currently has work that he hopes to avoid surgery until after this is completed on August 10th.   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   Pancreatic necrosis    Pancreatitis    PONV (postoperative nausea and vomiting)       Past Surgical History:  Procedure Laterality Date   CARPAL TUNNEL RELEASE Right 10/18/2021   Procedure: Right CARPAL TUNNEL RELEASE;  Surgeon: Marlyne Beards, MD;  Location: [facility];  Service: Orthopedics;  Laterality: Right;   CARPAL TUNNEL RELEASE Left 11/01/2021   Procedure: Left CARPAL TUNNEL RELEASE;  Surgeon: Marlyne Beards, MD;  Location: [facility];   Service: Orthopedics;  Laterality: Left;    No Known Allergies  Current Outpatient Medications  Medication Sig Dispense Refill   buPROPion (WELLBUTRIN XL) 150 MG 24 hr tablet Take 1 tablet (150 mg total) by mouth daily. 90 tablet 1   cetirizine (ZYRTEC) 10 MG tablet Take 1 tablet (10 mg total) by mouth daily as needed for allergies. 90 tablet 0   Continuous Glucose Sensor (FREESTYLE LIBRE 3 SENSOR) MISC Place 1 sensor on the skin every 14 days. Use to check glucose continuously 2 each 3   Glucagon 1 MG/0.2ML SOAJ 1 mg (1 mL) injected subcutaneously or intramuscularly into the upper arm, thigh, or buttocks, or intravenously for hypoglycemia 0.2 mL 2   insulin aspart (NOVOLOG FLEXPEN) 100 UNIT/ML FlexPen Inject 4 units with each meal. Additionally, add 3 units for each 40 points above 140. 12 mL 6   Insulin Glargine (BASAGLAR KWIKPEN) 100 UNIT/ML Inject 36 Units into the skin daily. 15 mL 3   Insulin Pen Needle (PEN NEEDLES) 32G X 4 MM MISC Use 1 pen needle to inject insulin 4 times per day. 200 each 11   Multiple Vitamins-Minerals (MULTIVITAMIN WITH MINERALS) tablet Take 1 tablet by mouth daily.     No current facility-administered medications for this visit.    Family History Family History  Problem Relation Age of Onset   Diabetes Father    Diabetes Paternal Grandmother  Social History Social History   Tobacco Use   Smoking status: Every Day    Current packs/day: 0.50    Average packs/day: 0.5 packs/day for 37.0 years (18.5 ttl pk-yrs)    Types: Cigarettes    Passive exposure: Past   Smokeless tobacco: Never  Vaping Use   Vaping status: Never Used  Substance Use Topics   Alcohol use: Not Currently    Comment: occasional   Drug use: Yes    Types: Marijuana        Review of Systems  Constitutional: Negative.   HENT: Negative.    Eyes: Negative.   Respiratory: Negative.    Cardiovascular: Negative.   Gastrointestinal: Negative.   Genitourinary: Negative.    Skin: Negative.   Neurological: Negative.   Psychiatric/Behavioral: Negative.       Physical Exam Blood pressure 136/88, pulse 77, temperature 98.2 F (36.8 C), height 5\' 3"  (1.6 m), weight 197 lb (89.4 kg), SpO2 97%. Last Weight  Most recent update: 06/20/2023 10:27 AM    Weight  89.4 kg (197 lb)              CONSTITUTIONAL: Well developed, and nourished, appropriately responsive and aware without distress.   EYES: Sclera non-icteric.   EARS, NOSE, MOUTH AND THROAT:  The oropharynx is clear. Oral mucosa is pink and moist.    Hearing is intact to voice.  NECK: Trachea is midline, and there is no jugular venous distension.  LYMPH NODES:  Lymph nodes in the neck are not appreciated. RESPIRATORY:  Lungs are clear, and breath sounds are equal bilaterally.  Normal respiratory effort without pathologic use of accessory muscles. CARDIOVASCULAR: Heart is regular in rate and rhythm.   Well perfused.  GI: The abdomen is  soft, nontender, and nondistended. There were no palpable masses.  I did not appreciate hepatosplenomegaly. MUSCULOSKELETAL:  Symmetrical muscle tone appreciated in all four extremities.    SKIN: Skin turgor is normal. No pathologic skin lesions appreciated.  NEUROLOGIC:  Motor and sensation appear grossly normal.  Cranial nerves are grossly without defect. PSYCH:  Alert and oriented to person, place and time. Affect is appropriate for situation.  Data Reviewed I have personally reviewed what is currently available of the patient's imaging, recent labs and medical records.   Labs:     Latest Ref Rng & Units 02/20/2023   11:20 PM 02/15/2023    4:29 AM 02/14/2023    4:19 AM  CBC  WBC 4.0 - 10.5 K/uL 8.6  8.4  9.4   Hemoglobin 13.0 - 17.0 g/dL 16.1  09.6  04.5   Hematocrit 39.0 - 52.0 % 40.5  36.9  33.6   Platelets 150 - 400 K/uL 465  424  351       Latest Ref Rng & Units 06/17/2023   10:58 AM 04/29/2023   10:38 AM 04/24/2023    9:04 AM  CMP  Creatinine 0.61 - 1.24  mg/dL  4.09    Total Protein 6.0 - 8.5 g/dL 6.6   6.4   Total Bilirubin 0.0 - 1.2 mg/dL 0.8   1.0   Alkaline Phos 44 - 121 IU/L 114   127   AST 0 - 40 IU/L 36   112   ALT 0 - 44 IU/L 47   116       Imaging: Radiological images reviewed:  CLINICAL DATA:  Abdominal pain, acute pancreatitis   EXAM: CT ABDOMEN AND PELVIS WITH CONTRAST   TECHNIQUE: Multidetector CT imaging of the  abdomen and pelvis was performed using the standard protocol following bolus administration of intravenous contrast.   RADIATION DOSE REDUCTION: This exam was performed according to the departmental dose-optimization program which includes automated exposure control, adjustment of the mA and/or kV according to patient size and/or use of iterative reconstruction technique.   CONTRAST:  OMNIPAQUE IOHEXOL 300 MG/ML  SOLN   COMPARISON:  MRI abdomen dated 02/07/2023. CT abdomen/pelvis dated 01/31/2023.   FINDINGS: Lower chest: Trace left pleural effusion with minimal left basilar atelectasis.   Hepatobiliary: Liver is within normal limits, noting mild steatosis focal fatty sparing along the gallbladder fossa.   Tiny layering gallstones (series 2/image 38), without associated inflammatory changes. No intrahepatic or extrahepatic duct dilatation.   Pancreas: Severe acute pancreatitis with suspected progressive parenchymal necrosis along the distal pancreatic tail (series 2/image 36) and anterior pancreatic head/uncinate process (series 2/image 49). Associated progressive acute peripancreatic fluid/inflammatory changes without wall/rim to suggest walled-off necrosis (series 2/image 40).   Spleen: Within normal limits, noting trace perisplenic fluid inferiorly (series 2/image 39).   Adrenals/Urinary Tract: Adrenal glands are within normal limits.   Kidneys are within normal limits.  No hydronephrosis.   Bladder is within normal limits.   Stomach/Bowel: Mild perigastric fluid and secondary  wall thickening due to peripancreatic inflammatory changes.   No evidence of bowel obstruction.   Normal appendix (series 2/image 7).   No colonic wall thickening or inflammatory changes.   Vascular/Lymphatic: No evidence of abdominal aortic aneurysm.   No suspicious abdominopelvic lymphadenopathy.   Reproductive: Prostate is unremarkable.   Other: In addition to the peripancreatic fluid, there is mild retroperitoneal/anterior pararenal fluid, left greater than right, and trace presacral fluid.   Musculoskeletal: Mild degenerative changes of the lower thoracic spine.   IMPRESSION: Severe acute pancreatitis with pancreatic necrosis and acute pancreatic fluid collections, as described above.   Cholelithiasis, without associated inflammatory changes.   Trace left pleural effusion with minimal left basilar atelectasis.     Electronically Signed   By: Charline Bills M.D.   On: 02/11/2023 08:11  CLINICAL DATA:  212411 Elevated liver enzymes [id number]   EXAM: ULTRASOUND ABDOMEN LIMITED RIGHT UPPER QUADRANT   COMPARISON:  CT 01/31/2023   FINDINGS: Gallbladder:   The gallbladder is mildly distended. There are 3 non-mobile foci noted along the gallbladder wall, largest measuring 4 mm, could be small polyps or stones. Trace intraluminal sludge. Negative sonographic Murphy sign. No wall thickening.   Common bile duct:   Diameter: 2.7 mm, normal.  No intrahepatic ductal dilation   Liver:   Diffusely increased liver echogenicity. Portal vein is patent on color Doppler imaging with normal direction of blood flow towards the liver.   Other: None.   IMPRESSION: Diffuse hepatic steatosis.   Three non-mobile echogenic foci, could be small polyps or stones, largest measuring 4 mm.   No evidence of acute cholecystitis or biliary obstruction.     Electronically Signed   By: Caprice Renshaw M.D.   On: 02/06/2023 13:11  Within last 24 hrs: No results  found.  Assessment    History of acute pancreatitis, suspicion of contribution by gallbladder for recurrence. 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   Abnormal CT of the abdomen 02/09/2023   Abdominal pain,  epigastric 02/09/2023   Dizziness 10/10/2022   Carpal tunnel syndrome, left upper limb    Carpal tunnel syndrome, right upper limb    Bilateral carpal tunnel syndrome 08/07/2021   GAD (generalized anxiety disorder) 05/01/2021   MDD (major depressive disorder), recurrent severe, without psychosis (HCC) 04/30/2021   Hyperlipidemia 03/08/2021   Morbid obesity (HCC) 03/08/2021   Diabetes mellitus (HCC) 01/19/2021   Hypertriglyceridemia     Plan     We discussed robotic cholecystectomy.  This was discussed thoroughly.  Optimal plan is for robotic cholecystectomy utilizing ICG imaging. Risks and benefits have been discussed with the patient which include but are not limited to anesthesia, bleeding, infection, biliary ductal injury, resulting in leak or stenosis, other associated unanticipated injuries affiliated with laparoscopic surgery.   Reviewed that removing the gallbladder will only address the symptoms related to the gallbladder itself.  I believe there is the desire to proceed, accepting the risks with understanding.  Questions elicited and answered to satisfaction.    No guarantees ever expressed or implied.   Face-to-face time spent with the patient and accompanying care providers(if present) was 40 minutes, with more than 50% of the time spent counseling, educating, and coordinating care of the patient.    These notes generated with voice recognition software. I apologize for typographical  errors.  Campbell Lerner M.D., FACS 06/20/2023, 11:58 AM

## 2023-06-19 NOTE — Progress Notes (Unsigned)
Patient ID: George Howe, male   DOB: November 21, 1985, 37 y.o.   MRN: [id number]  Chief Complaint: History of pancreatitis  History of Present Illness George Howe is a 37 y.o. male with history of prior episodes of pancreatitis, has been advised to have his gallbladder removed.  1 episode he had triglycerides greater than 5000, denies significant alcohol use.  His last CT scan showed some degree of complicated pancreatitis, this resolved without any type of intervention.  He was hospitalized for this.  He currently denies any right upper quadrant pain, fatty food intolerance.  Though he reports eating rather cleanly.  He had an ultrasound of his gallbladder which does not confirm the absence of cholelithiasis.  He currently has work that he hopes to avoid surgery until after this is completed on August 10th.   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   Pancreatic necrosis    Pancreatitis    PONV (postoperative nausea and vomiting)       Past Surgical History:  Procedure Laterality Date   CARPAL TUNNEL RELEASE Right 10/18/2021   Procedure: Right CARPAL TUNNEL RELEASE;  Surgeon: Marlyne Beards, MD;  [facility];  Service: Orthopedics;  Laterality: Right;   CARPAL TUNNEL RELEASE Left 11/01/2021   Procedure: Left CARPAL TUNNEL RELEASE;  Surgeon: Marlyne Beards, MD;  Location: [facility];   Service: Orthopedics;  Laterality: Left;    No Known Allergies  Current Outpatient Medications  Medication Sig Dispense Refill   buPROPion (WELLBUTRIN XL) 150 MG 24 hr tablet Take 1 tablet (150 mg total) by mouth daily. 90 tablet 1   cetirizine (ZYRTEC) 10 MG tablet Take 1 tablet (10 mg total) by mouth daily as needed for allergies. 90 tablet 0   Continuous Glucose Sensor (FREESTYLE LIBRE 3 SENSOR) MISC Place 1 sensor on the skin every 14 days. Use to check glucose continuously 2 each 3   Glucagon 1 MG/0.2ML SOAJ 1 mg (1 mL) injected subcutaneously or intramuscularly into the upper arm, thigh, or buttocks, or intravenously for hypoglycemia 0.2 mL 2   insulin aspart (NOVOLOG FLEXPEN) 100 UNIT/ML FlexPen Inject 4 units with each meal. Additionally, add 3 units for each 40 points above 140. 12 mL 6   Insulin Glargine (BASAGLAR KWIKPEN) 100 UNIT/ML Inject 36 Units into the skin daily. 15 mL 3   Insulin Pen Needle (PEN NEEDLES) 32G X 4 MM MISC Use 1 pen needle to inject insulin 4 times per day. 200 each 11   Multiple Vitamins-Minerals (MULTIVITAMIN WITH MINERALS) tablet Take 1 tablet by mouth daily.     No current facility-administered medications for this visit.    Family History Family History  Problem Relation Age of Onset   Diabetes Father    Diabetes Paternal Grandmother  Social History Social History   Tobacco Use   Smoking status: Every Day    Current packs/day: 0.50    Average packs/day: 0.5 packs/day for 37.0 years (18.5 ttl pk-yrs)    Types: Cigarettes    Passive exposure: Past   Smokeless tobacco: Never  Vaping Use   Vaping status: Never Used  Substance Use Topics   Alcohol use: Not Currently    Comment: occasional   Drug use: Yes    Types: Marijuana        Review of Systems  Constitutional: Negative.   HENT: Negative.    Eyes: Negative.   Respiratory: Negative.    Cardiovascular: Negative.   Gastrointestinal: Negative.   Genitourinary: Negative.    Skin: Negative.   Neurological: Negative.   Psychiatric/Behavioral: Negative.       Physical Exam There were no vitals taken for this visit.    CONSTITUTIONAL: Well developed, and nourished, appropriately responsive and aware without distress.   EYES: Sclera non-icteric.   EARS, NOSE, MOUTH AND THROAT:  The oropharynx is clear. Oral mucosa is pink and moist.    Hearing is intact to voice.  NECK: Trachea is midline, and there is no jugular venous distension.  LYMPH NODES:  Lymph nodes in the neck are not appreciated. RESPIRATORY:  Lungs are clear, and breath sounds are equal bilaterally.  Normal respiratory effort without pathologic use of accessory muscles. CARDIOVASCULAR: Heart is regular in rate and rhythm.   Well perfused.  GI: The abdomen is  soft, nontender, and nondistended. There were no palpable masses.  I did not appreciate hepatosplenomegaly. MUSCULOSKELETAL:  Symmetrical muscle tone appreciated in all four extremities.    SKIN: Skin turgor is normal. No pathologic skin lesions appreciated.  NEUROLOGIC:  Motor and sensation appear grossly normal.  Cranial nerves are grossly without defect. PSYCH:  Alert and oriented to person, place and time. Affect is appropriate for situation.  Data Reviewed I have personally reviewed what is currently available of the patient's imaging, recent labs and medical records.   Labs:     Latest Ref Rng & Units 02/20/2023   11:20 PM 02/15/2023    4:29 AM 02/14/2023    4:19 AM  CBC  WBC 4.0 - 10.5 K/uL 8.6  8.4  9.4   Hemoglobin 13.0 - 17.0 g/dL 84.1  32.4  40.1   Hematocrit 39.0 - 52.0 % 40.5  36.9  33.6   Platelets 150 - 400 K/uL 465  424  351       Latest Ref Rng & Units 06/17/2023   10:58 AM 04/29/2023   10:38 AM 04/24/2023    9:04 AM  CMP  Creatinine 0.61 - 1.24 mg/dL  0.27    Total Protein 6.0 - 8.5 g/dL 6.6   6.4   Total Bilirubin 0.0 - 1.2 mg/dL 0.8   1.0   Alkaline Phos 44 - 121 IU/L 114   127   AST 0 - 40 IU/L 36   112   ALT  0 - 44 IU/L 47   116       Imaging: Radiological images reviewed:  CLINICAL DATA:  Abdominal pain, acute pancreatitis   EXAM: CT ABDOMEN AND PELVIS WITH CONTRAST   TECHNIQUE: Multidetector CT imaging of the abdomen and pelvis was performed using the standard protocol following bolus administration of intravenous contrast.   RADIATION DOSE REDUCTION: This exam was performed according to the departmental dose-optimization program which includes automated exposure control, adjustment of the mA and/or kV according  to patient size and/or use of iterative reconstruction technique.   CONTRAST:  OMNIPAQUE IOHEXOL 300 MG/ML  SOLN   COMPARISON:  MRI abdomen dated 02/07/2023. CT abdomen/pelvis dated 01/31/2023.   FINDINGS: Lower chest: Trace left pleural effusion with minimal left basilar atelectasis.   Hepatobiliary: Liver is within normal limits, noting mild steatosis focal fatty sparing along the gallbladder fossa.   Tiny layering gallstones (series 2/image 38), without associated inflammatory changes. No intrahepatic or extrahepatic duct dilatation.   Pancreas: Severe acute pancreatitis with suspected progressive parenchymal necrosis along the distal pancreatic tail (series 2/image 36) and anterior pancreatic head/uncinate process (series 2/image 49). Associated progressive acute peripancreatic fluid/inflammatory changes without wall/rim to suggest walled-off necrosis (series 2/image 40).   Spleen: Within normal limits, noting trace perisplenic fluid inferiorly (series 2/image 39).   Adrenals/Urinary Tract: Adrenal glands are within normal limits.   Kidneys are within normal limits.  No hydronephrosis.   Bladder is within normal limits.   Stomach/Bowel: Mild perigastric fluid and secondary wall thickening due to peripancreatic inflammatory changes.   No evidence of bowel obstruction.   Normal appendix (series 2/image 7).   No colonic wall thickening or  inflammatory changes.   Vascular/Lymphatic: No evidence of abdominal aortic aneurysm.   No suspicious abdominopelvic lymphadenopathy.   Reproductive: Prostate is unremarkable.   Other: In addition to the peripancreatic fluid, there is mild retroperitoneal/anterior pararenal fluid, left greater than right, and trace presacral fluid.   Musculoskeletal: Mild degenerative changes of the lower thoracic spine.   IMPRESSION: Severe acute pancreatitis with pancreatic necrosis and acute pancreatic fluid collections, as described above.   Cholelithiasis, without associated inflammatory changes.   Trace left pleural effusion with minimal left basilar atelectasis.     Electronically Signed   By: Charline Bills M.D.   On: 02/11/2023 08:11  CLINICAL DATA:  212411 Elevated liver enzymes [id number]   EXAM: ULTRASOUND ABDOMEN LIMITED RIGHT UPPER QUADRANT   COMPARISON:  CT 01/31/2023   FINDINGS: Gallbladder:   The gallbladder is mildly distended. There are 3 non-mobile foci noted along the gallbladder wall, largest measuring 4 mm, could be small polyps or stones. Trace intraluminal sludge. Negative sonographic Murphy sign. No wall thickening.   Common bile duct:   Diameter: 2.7 mm, normal.  No intrahepatic ductal dilation   Liver:   Diffusely increased liver echogenicity. Portal vein is patent on color Doppler imaging with normal direction of blood flow towards the liver.   Other: None.   IMPRESSION: Diffuse hepatic steatosis.   Three non-mobile echogenic foci, could be small polyps or stones, largest measuring 4 mm.   No evidence of acute cholecystitis or biliary obstruction.     Electronically Signed   By: Caprice Renshaw M.D.   On: 02/06/2023 13:11  Within last 24 hrs: No results found.  Assessment    History of acute pancreatitis, suspicion of contribution by gallbladder for recurrence. 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 We discussed robotic cholecystectomy.  This was discussed thoroughly.  Optimal plan is for robotic cholecystectomy utilizing ICG imaging. Risks and benefits have been discussed with the patient which include but are not limited to anesthesia, bleeding, infection, biliary ductal injury, resulting in leak or stenosis, other associated unanticipated injuries affiliated with laparoscopic surgery.   Reviewed that removing the gallbladder will only address the symptoms related to the gallbladder itself.  I believe there is the desire to proceed, accepting the risks with understanding.  Questions elicited and answered to satisfaction.    No guarantees ever expressed or implied.   Face-to-face time spent with the patient and accompanying care providers(if present) was 40 minutes, with more than 50% of the time spent counseling, educating, and coordinating care of the patient.    These notes generated with voice recognition software. I apologize for typographical errors.  Campbell Lerner M.D., FACS 06/19/2023, 1:22 PM

## 2023-06-20 ENCOUNTER — Ambulatory Visit: Payer: Self-pay | Admitting: Surgery

## 2023-06-20 ENCOUNTER — Ambulatory Visit: Payer: 59 | Admitting: Surgery

## 2023-06-20 ENCOUNTER — Encounter: Payer: Self-pay | Admitting: Surgery

## 2023-06-20 ENCOUNTER — Telehealth: Payer: Self-pay | Admitting: Surgery

## 2023-06-20 VITALS — BP 136/88 | HR 77 | Temp 98.2°F | Ht 63.0 in | Wt 197.0 lb

## 2023-06-20 DIAGNOSIS — K8010 Calculus of gallbladder with chronic cholecystitis without obstruction: Secondary | ICD-10-CM | POA: Insufficient documentation

## 2023-06-20 NOTE — Telephone Encounter (Signed)
Left message for patient to call, please inform him of the following regarding scheduled surgery with Dr. Claudine Mouton.   Pre-Admission date/time, and Surgery date at [facility]  Surgery Date: 07/05/23 Preadmission Testing Date: 06/27/23 (phone 1p-4p)  Also patient will  need to call at [phone], between 1-3:00pm the day before surgery, to find out what time to arrive for surgery.

## 2023-06-20 NOTE — Patient Instructions (Signed)
You have requested to have your gallbladder removed. This will be done at [facility] with Dr. Claudine Mouton.  You will most likely be out of work 1-2 weeks for this surgery.  If you have FMLA or disability paperwork that needs filled out you may drop this off at our office or this can be faxed to [phone].  You will return after your post-op appointment with a lifting restriction for approximately 4 more weeks.  You will be able to eat anything you would like to following surgery. But, start by eating a bland diet and advance this as tolerated. The Gallbladder diet is below, please go as closely by this diet as possible prior to surgery to avoid any further attacks.  Please see the (blue)pre-care form that you have been given today. Our surgery scheduler will call you to verify surgery date and to go over information.   If you have any questions, please call our office.  Laparoscopic Cholecystectomy Laparoscopic cholecystectomy is surgery to remove the gallbladder. The gallbladder is located in the upper right part of the abdomen, behind the liver. It is a storage sac for bile, which is produced in the liver. Bile aids in the digestion and absorption of fats. Cholecystectomy is often done for inflammation of the gallbladder (cholecystitis). This condition is usually caused by a buildup of gallstones (cholelithiasis) in the gallbladder. Gallstones can block the flow of bile, and that can result in inflammation and pain. In severe cases, emergency surgery may be required. If emergency surgery is not required, you will have time to prepare for the procedure. Laparoscopic surgery is an alternative to open surgery. Laparoscopic surgery has a shorter recovery time. Your common bile duct may also need to be examined during the procedure. If stones are found in the common bile duct, they may be removed. LET [facility] CARE PROVIDER KNOW ABOUT: Any allergies you have. All medicines you are taking,  including vitamins, herbs, eye drops, creams, and over-the-counter medicines. Previous problems you or members of your family have had with the use of anesthetics. Any blood disorders you have. Previous surgeries you have had.  Any medical conditions you have. RISKS AND COMPLICATIONS Generally, this is a safe procedure. However, problems may occur, including: Infection. Bleeding. Allergic reactions to medicines. Damage to other structures or organs. A stone remaining in the common bile duct. A bile leak from the cyst duct that is clipped when your gallbladder is removed. The need to convert to open surgery, which requires a larger incision in the abdomen. This may be necessary if your surgeon thinks that it is not safe to continue with a laparoscopic procedure. BEFORE THE PROCEDURE Ask your health care provider about: Changing or stopping your regular medicines. This is especially important if you are taking diabetes medicines or blood thinners. Taking medicines such as aspirin and ibuprofen. These medicines can thin your blood. Do not take these medicines before your procedure if your health care provider instructs you not to. Follow instructions from your health care provider about eating or drinking restrictions. Let your health care provider know if you develop a cold or an infection before surgery. Plan to have someone take you home after the procedure. Ask your health care provider how your surgical site will be marked or identified. You may be given antibiotic medicine to help prevent infection. PROCEDURE To reduce your risk of infection: Your health care team will wash or sanitize their hands. Your skin will be washed with soap. An IV  tube may be inserted into one of your veins. You will be given a medicine to make you fall asleep (general anesthetic). A breathing tube will be placed in your mouth. The surgeon will make several small cuts (incisions) in your abdomen. A thin,  lighted tube (laparoscope) that has a tiny camera on the end will be inserted through one of the small incisions. The camera on the laparoscope will send a picture to a TV screen (monitor) in the operating room. This will give the surgeon a good view inside your abdomen. A gas will be pumped into your abdomen. This will expand your abdomen to give the surgeon more room to perform the surgery. Other tools that are needed for the procedure will be inserted through the other incisions. The gallbladder will be removed through one of the incisions. After your gallbladder has been removed, the incisions will be closed with stitches (sutures), staples, or skin glue. Your incisions may be covered with a bandage (dressing). The procedure may vary among health care providers and hospitals. AFTER THE PROCEDURE Your blood pressure, heart rate, breathing rate, and blood oxygen level will be monitored often until the medicines you were given have worn off. You will be given medicines as needed to control your pain.   This information is not intended to replace advice given to you by your health care provider. Make sure you discuss any questions you have with your health care provider.   Document Released: 09/10/2005 Document Revised: 06/01/2015 Document Reviewed: 04/22/2013 Elsevier Interactive Patient Education 2016 [employer] Inc[employer]   Low-Fat Diet for Gallbladder Conditions A low-fat diet can be helpful if you have pancreatitis or a gallbladder condition. With these conditions, your pancreas and gallbladder have trouble digesting fats. A healthy eating plan with less fat will help rest your pancreas and gallbladder and reduce your symptoms. WHAT DO I NEED TO KNOW ABOUT THIS DIET? Eat a low-fat diet. Reduce your fat intake to less than 20-30% of your total daily calories. This is less than 50-60 g of fat per day. Remember that you need some fat in your diet. Ask your dietician what your daily goal should  be. Choose nonfat and low-fat healthy foods. Look for the words "nonfat," "low fat," or "fat free." As a guide, look on the label and choose foods with less than 3 g of fat per serving. Eat only one serving. Avoid alcohol. Do not smoke. If you need help quitting, talk with your health care provider. Eat small frequent meals instead of three large heavy meals. WHAT FOODS CAN I EAT? Grains Include healthy grains and starches such as potatoes, wheat bread, fiber-rich cereal, and brown rice. Choose whole grain options whenever possible. In adults, whole grains should account for 45-65% of your daily calories.  Fruits and Vegetables Eat plenty of fruits and vegetables. Fresh fruits and vegetables add fiber to your diet. Meats and Other Protein Sources Eat lean meat such as chicken and pork. Trim any fat off of meat before cooking it. Eggs, fish, and beans are other sources of protein. In adults, these foods should account for 10-35% of your daily calories. Dairy Choose low-fat milk and dairy options. Dairy includes fat and protein, as well as calcium.  Fats and Oils Limit high-fat foods such as fried foods, sweets, baked goods, sugary drinks.  Other Creamy sauces and condiments, such as mayonnaise, can add extra fat. Think about whether or not you need to use them, or use smaller amounts or low fat options.  WHAT FOODS ARE NOT RECOMMENDED? High fat foods, such as: [employer]. Ice cream. [place] toast. Sweet rolls. Pizza. Cheese bread. Foods covered with batter, butter, creamy sauces, or cheese. Fried foods. Sugary drinks and desserts. Foods that cause gas or bloating   This information is not intended to replace advice given to you by your health care provider. Make sure you discuss any questions you have with your health care provider.   Document Released: 09/15/2013 Document Reviewed: 09/15/2013 Elsevier Interactive Patient Education [employer]

## 2023-06-24 NOTE — Telephone Encounter (Signed)
Left another message for patient to call.

## 2023-06-24 NOTE — Telephone Encounter (Signed)
Patient calls back, he is informed of all dates regarding surgery.   

## 2023-06-26 ENCOUNTER — Encounter
Admission: RE | Admit: 2023-06-26 | Discharge: 2023-06-26 | Disposition: A | Discharge status: Home / Self Care | Payer: 59 | Source: Ambulatory Visit | Attending: Surgery | Admitting: Surgery

## 2023-06-26 VITALS — Ht 63.0 in | Wt 197.0 lb

## 2023-06-26 DIAGNOSIS — E119 Type 2 diabetes mellitus without complications: Secondary | ICD-10-CM

## 2023-06-26 DIAGNOSIS — Z01812 Encounter for preprocedural laboratory examination: Secondary | ICD-10-CM

## 2023-06-26 NOTE — Patient Instructions (Addendum)
Your procedure is scheduled on: Friday, October 11 Report to the Registration Desk on the 1st floor of the [facility] To find out your arrival time, please call [phone] between 1PM - 3PM on: Thursday, October 10 If your arrival time is 6:00 am, do not arrive before that time as the [facility] entrance doors do not open until 6:00 am.  REMEMBER: Instructions that are not followed completely may result in serious medical risk, up to and including death; or upon the discretion of your surgeon and anesthesiologist your surgery may need to be rescheduled.  Do not eat or drink after midnight the night before surgery.  No gum chewing or hard candies.  One week prior to surgery: starting October 4  Stop Anti-inflammatories (NSAIDS) such as Advil, Aleve, Ibuprofen, Motrin, Naproxen, Naprosyn and Aspirin based products such as Excedrin, Goody's Powder, BC Powder.  Stop ANY OVER THE COUNTER supplements until after surgery. Stop multiple vitamin You may however, continue to take Tylenol if needed for pain up until the day of surgery.  Only take half of your insulin glargine (Basaglar) the night before surgery. Only take 18 units on Thursday, October 10 evening.  Do not take any insulin on the day of surgery.  TAKE ONLY THESE MEDICATIONS THE MORNING OF SURGERY WITH A SIP OF WATER:  Bupropion  No Alcohol for 24 hours before or after surgery.  No Smoking including e-cigarettes for 24 hours before surgery.  No chewable tobacco products for at least 6 hours before surgery.  No nicotine patches on the day of surgery.  Do not use any "recreational" drugs for at least a week (preferably 2 weeks) before your surgery.  Please be advised that the combination of cocaine and anesthesia may have negative outcomes, up to and including death. If you test positive for cocaine, your surgery will be cancelled.  On the morning of surgery brush your teeth with toothpaste and water, you may rinse your  mouth with mouthwash if you wish. Do not swallow any toothpaste or mouthwash.  Use CHG Soap as directed on instruction sheet.  Do not wear jewelry, make-up, hairpins, clips or nail polish.  For welded (permanent) jewelry: bracelets, anklets, waist bands, etc.  Please have this removed prior to surgery.  If it is not removed, there is a chance that hospital personnel will need to cut it off on the day of surgery.  Do not wear lotions, powders, or perfumes.   Do not shave body hair from the neck down 48 hours before surgery.  Contact lenses, hearing aids and dentures may not be worn into surgery.  Do not bring valuables to the hospital. [facility] is not responsible for any missing/lost belongings or valuables.   Notify your doctor if there is any change in your medical condition (cold, fever, infection).  Wear comfortable clothing (specific to your surgery type) to the hospital.  After surgery, you can help prevent lung complications by doing breathing exercises.  Take deep breaths and cough every 1-2 hours. Your doctor may order a device called an Incentive Spirometer to help you take deep breaths. When coughing or sneezing, hold a pillow firmly against your incision with both hands. This is called "splinting." Doing this helps protect your incision. It also decreases belly discomfort.  If you are being discharged the day of surgery, you will not be allowed to drive home. You will need a responsible individual to drive you home and stay with you for 24 hours after surgery.  If you are taking public transportation, you will need to have a responsible individual with you.  Please call the Pre-admissions Testing Dept. at [phone] if you have any questions about these instructions.  Surgery Visitation Policy:  Patients having surgery or a procedure may have two visitors.  Children under the age of 5 must have an adult with them who is not the patient.       Preparing  for Surgery with CHLORHEXIDINE GLUCONATE (CHG) Soap  Chlorhexidine Gluconate (CHG) Soap  o An antiseptic cleaner that kills germs and bonds with the skin to continue killing germs even after washing  o Used for showering the night before surgery and morning of surgery  Before surgery, you can play an important role by reducing the number of germs on your skin.  CHG (Chlorhexidine gluconate) soap is an antiseptic cleanser which kills germs and bonds with the skin to continue killing germs even after washing.  Please do not use if you have an allergy to CHG or antibacterial soaps. If your skin becomes reddened/irritated stop using the CHG.  1. Shower the NIGHT BEFORE SURGERY and the MORNING OF SURGERY with CHG soap.  2. If you choose to wash your hair, wash your hair first as usual with your normal shampoo.  3. After shampooing, rinse your hair and body thoroughly to remove the shampoo.  4. Use CHG as you would any other liquid soap. You can apply CHG directly to the skin and wash gently with a scrungie or a clean washcloth.  5. Apply the CHG soap to your body only from the neck down. Do not use on open wounds or open sores. Avoid contact with your eyes, ears, mouth, and genitals (private parts). Wash face and genitals (private parts) with your normal soap.  6. Wash thoroughly, paying special attention to the area where your surgery will be performed.  7. Thoroughly rinse your body with warm water.  8. Do not shower/wash with your normal soap after using and rinsing off the CHG soap.  9. Pat yourself dry with a clean towel.  10. Wear clean pajamas to bed the night before surgery.  12. Place clean sheets on your bed the night of your first shower and do not sleep with pets.  13. Shower again with the CHG soap on the day of surgery prior to arriving at the hospital.  14. Do not apply any deodorants/lotions/powders.  15. Please wear clean clothes to the hospital.

## 2023-06-27 ENCOUNTER — Inpatient Hospital Stay: Admission: RE | Admit: 2023-06-27 | Payer: 59 | Source: Ambulatory Visit

## 2023-06-28 ENCOUNTER — Encounter
Admission: RE | Admit: 2023-06-28 | Discharge: 2023-06-28 | Disposition: A | Discharge status: Home / Self Care | Payer: 59 | Source: Ambulatory Visit | Attending: Surgery | Admitting: Surgery

## 2023-06-28 DIAGNOSIS — Z794 Long term (current) use of insulin: Secondary | ICD-10-CM | POA: Diagnosis not present

## 2023-06-28 DIAGNOSIS — E119 Type 2 diabetes mellitus without complications: Secondary | ICD-10-CM | POA: Insufficient documentation

## 2023-06-28 DIAGNOSIS — Z01812 Encounter for preprocedural laboratory examination: Secondary | ICD-10-CM | POA: Diagnosis not present

## 2023-06-28 DIAGNOSIS — Z01810 Encounter for preprocedural cardiovascular examination: Secondary | ICD-10-CM | POA: Insufficient documentation

## 2023-06-28 DIAGNOSIS — Z01818 Encounter for other preprocedural examination: Secondary | ICD-10-CM | POA: Diagnosis present

## 2023-06-28 LAB — BASIC METABOLIC PANEL
Anion gap: 9 (ref 5–15)
BUN: 17 mg/dL (ref 6–20)
CO2: 24 mmol/L (ref 22–32)
Calcium: 9.1 mg/dL (ref 8.9–10.3)
Chloride: 103 mmol/L (ref 98–111)
Creatinine, Ser: 0.98 mg/dL (ref 0.61–1.24)
GFR, Estimated: >60 mL/min (ref >60)
Glucose, Bld: 233 mg/dL — ABNORMAL HIGH (ref 70–99)
Potassium: 4 mmol/L (ref 3.5–5.1)
Sodium: 136 mmol/L (ref 135–145)

## 2023-06-28 LAB — CBC
HCT: 45.8 % (ref 39.0–52.0)
Hemoglobin: 15.4 g/dL (ref 13.0–17.0)
MCH: 27.3 pg (ref 26.0–34.0)
MCHC: 33.6 g/dL (ref 30.0–36.0)
MCV: 81.1 fL (ref 80.0–100.0)
Platelets: 206 10*3/uL (ref 150–400)
RBC: 5.65 MIL/uL (ref 4.22–5.81)
RDW: 13.6 % (ref 11.5–15.5)
WBC: 6.4 10*3/uL (ref 4.0–10.5)
nRBC: 0 % (ref 0.0–0.2)

## 2023-07-01 ENCOUNTER — Ambulatory Visit: Payer: 59 | Admitting: Internal Medicine

## 2023-07-04 MED ORDER — INDOCYANINE GREEN 25 MG IV SOLR
1.2500 mg | Freq: Once | INTRAVENOUS | Status: AC
  Administered 2023-07-05: 1.25 mg via INTRAVENOUS

## 2023-07-04 MED ORDER — CHLORHEXIDINE GLUCONATE 0.12 % MT SOLN
15.0000 mL | Freq: Once | OROMUCOSAL | Status: AC
  Administered 2023-07-05: 15 mL via OROMUCOSAL

## 2023-07-04 MED ORDER — CHLORHEXIDINE GLUCONATE CLOTH 2 % EX PADS: 6.0000 | MEDICATED_PAD | Freq: Once | CUTANEOUS | Status: DC

## 2023-07-04 MED ORDER — GABAPENTIN 300 MG PO CAPS
300.0000 mg | ORAL_CAPSULE | ORAL | Status: AC
  Administered 2023-07-05: 300 mg via ORAL

## 2023-07-04 MED ORDER — CELECOXIB 200 MG PO CAPS
200.0000 mg | ORAL_CAPSULE | ORAL | Status: AC
  Administered 2023-07-05: 200 mg via ORAL

## 2023-07-04 MED ORDER — CEFAZOLIN SODIUM-DEXTROSE 2-4 GM/100ML-% IV SOLN
2.0000 g | INTRAVENOUS | Status: AC
  Administered 2023-07-05: 2 g via INTRAVENOUS

## 2023-07-04 MED ORDER — FAMOTIDINE 20 MG PO TABS
20.0000 mg | ORAL_TABLET | Freq: Once | ORAL | Status: AC
  Administered 2023-07-05: 20 mg via ORAL

## 2023-07-04 MED ORDER — ACETAMINOPHEN 500 MG PO TABS
1000.0000 mg | ORAL_TABLET | ORAL | Status: AC
  Administered 2023-07-05: 1000 mg via ORAL

## 2023-07-04 MED ORDER — CHLORHEXIDINE GLUCONATE CLOTH 2 % EX PADS
6.0000 | MEDICATED_PAD | Freq: Once | CUTANEOUS | Status: AC
  Administered 2023-07-05: 6 via TOPICAL

## 2023-07-04 MED ORDER — ORAL CARE MOUTH RINSE: 15.0000 mL | Freq: Once | OROMUCOSAL | Status: AC

## 2023-07-04 MED ORDER — SODIUM CHLORIDE 0.9 % IV SOLN: INTRAVENOUS | Status: DC

## 2023-07-04 MED ORDER — BUPIVACAINE LIPOSOME 1.3 % IJ SUSP: 20.0000 mL | Freq: Once | INTRAMUSCULAR | Status: DC

## 2023-07-05 ENCOUNTER — Ambulatory Visit: Payer: 59 | Admitting: Urgent Care

## 2023-07-05 ENCOUNTER — Ambulatory Visit: Payer: 59

## 2023-07-05 ENCOUNTER — Encounter
Admission: RE | Disposition: A | Discharge status: Home / Self Care | Payer: Self-pay | Source: Home / Self Care | Attending: Surgery

## 2023-07-05 ENCOUNTER — Other Ambulatory Visit (HOSPITAL_BASED_OUTPATIENT_CLINIC_OR_DEPARTMENT_OTHER): Payer: Self-pay

## 2023-07-05 ENCOUNTER — Encounter: Payer: Self-pay | Admitting: Surgery

## 2023-07-05 ENCOUNTER — Other Ambulatory Visit: Payer: Self-pay

## 2023-07-05 ENCOUNTER — Ambulatory Visit
Admission: RE | Admit: 2023-07-05 | Discharge: 2023-07-05 | Disposition: A | Discharge status: Home / Self Care | Payer: 59 | Attending: Surgery | Admitting: Surgery

## 2023-07-05 DIAGNOSIS — Z8719 Personal history of other diseases of the digestive system: Secondary | ICD-10-CM | POA: Insufficient documentation

## 2023-07-05 DIAGNOSIS — E119 Type 2 diabetes mellitus without complications: Secondary | ICD-10-CM | POA: Diagnosis not present

## 2023-07-05 DIAGNOSIS — F17210 Nicotine dependence, cigarettes, uncomplicated: Secondary | ICD-10-CM | POA: Insufficient documentation

## 2023-07-05 DIAGNOSIS — K8010 Calculus of gallbladder with chronic cholecystitis without obstruction: Secondary | ICD-10-CM | POA: Diagnosis present

## 2023-07-05 DIAGNOSIS — Z01812 Encounter for preprocedural laboratory examination: Secondary | ICD-10-CM

## 2023-07-05 LAB — GLUCOSE, CAPILLARY
Glucose-Capillary: 188 mg/dL — ABNORMAL HIGH (ref 70–99)
Glucose-Capillary: 256 mg/dL — ABNORMAL HIGH (ref 70–99)
Glucose-Capillary: 266 mg/dL — ABNORMAL HIGH (ref 70–99)
Glucose-Capillary: 247 mg/dL — ABNORMAL HIGH (ref 70–99)
Glucose-Capillary: 283 mg/dL — ABNORMAL HIGH (ref 70–99)

## 2023-07-05 SURGERY — CHOLECYSTECTOMY, ROBOT-ASSISTED, LAPAROSCOPIC
Anesthesia: General

## 2023-07-05 MED ORDER — ONDANSETRON HCL 4 MG/2ML IJ SOLN
INTRAMUSCULAR | Status: AC
  Filled 2023-07-05: qty 2

## 2023-07-05 MED ORDER — PROPOFOL 1000 MG/100ML IV EMUL
INTRAVENOUS | Status: AC
  Filled 2023-07-05: qty 100

## 2023-07-05 MED ORDER — FENTANYL CITRATE (PF) 100 MCG/2ML IJ SOLN
INTRAMUSCULAR | Status: AC
  Filled 2023-07-05: qty 2

## 2023-07-05 MED ORDER — LIDOCAINE HCL (CARDIAC) PF 100 MG/5ML IV SOSY
PREFILLED_SYRINGE | INTRAVENOUS | Status: DC | PRN
  Administered 2023-07-05: 100 mg via INTRAVENOUS

## 2023-07-05 MED ORDER — ESMOLOL HCL 100 MG/10ML IV SOLN
INTRAVENOUS | Status: DC | PRN
Start: 2023-07-05 — End: 2023-07-05
  Administered 2023-07-05: 50 mg via INTRAVENOUS

## 2023-07-05 MED ORDER — CELECOXIB 200 MG PO CAPS
ORAL_CAPSULE | ORAL | Status: AC
  Filled 2023-07-05: qty 1

## 2023-07-05 MED ORDER — FENTANYL CITRATE (PF) 100 MCG/2ML IJ SOLN
25.0000 ug | INTRAMUSCULAR | Status: DC | PRN
  Administered 2023-07-05 (×4): 25 ug via INTRAVENOUS

## 2023-07-05 MED ORDER — OXYCODONE HCL 5 MG/5ML PO SOLN: 5.0000 mg | Freq: Once | ORAL | Status: AC | PRN

## 2023-07-05 MED ORDER — SUGAMMADEX SODIUM 200 MG/2ML IV SOLN
INTRAVENOUS | Status: DC | PRN
  Administered 2023-07-05: 200 mg via INTRAVENOUS

## 2023-07-05 MED ORDER — BUPIVACAINE-EPINEPHRINE (PF) 0.25% -1:200000 IJ SOLN
INTRAMUSCULAR | Status: AC
  Filled 2023-07-05: qty 30

## 2023-07-05 MED ORDER — MIDAZOLAM HCL 2 MG/2ML IJ SOLN
INTRAMUSCULAR | Status: AC
  Filled 2023-07-05: qty 2

## 2023-07-05 MED ORDER — CHLORHEXIDINE GLUCONATE 0.12 % MT SOLN
OROMUCOSAL | Status: AC
  Filled 2023-07-05: qty 15

## 2023-07-05 MED ORDER — LACTATED RINGERS IV SOLN: INTRAVENOUS | Status: DC | PRN

## 2023-07-05 MED ORDER — FENTANYL CITRATE (PF) 100 MCG/2ML IJ SOLN
INTRAMUSCULAR | Status: DC | PRN
  Administered 2023-07-05 (×3): 50 ug via INTRAVENOUS
  Administered 2023-07-05: 100 ug via INTRAVENOUS
  Administered 2023-07-05: 50 ug via INTRAVENOUS

## 2023-07-05 MED ORDER — FAMOTIDINE 20 MG PO TABS
ORAL_TABLET | ORAL | Status: AC
  Filled 2023-07-05: qty 1

## 2023-07-05 MED ORDER — HYDROCODONE-ACETAMINOPHEN 5-325 MG PO TABS
1.0000 | ORAL_TABLET | Freq: Four times a day (QID) | ORAL | 0 refills | Status: DC | PRN
  Filled 2023-07-05: qty 15, 4d supply, fill #0

## 2023-07-05 MED ORDER — SODIUM CHLORIDE 0.9 % IR SOLN
Status: DC | PRN
Start: 2023-07-05 — End: 2023-07-05
  Administered 2023-07-05: 1

## 2023-07-05 MED ORDER — FENTANYL CITRATE (PF) 250 MCG/5ML IJ SOLN
INTRAMUSCULAR | Status: AC
  Filled 2023-07-05: qty 5

## 2023-07-05 MED ORDER — BUPIVACAINE-EPINEPHRINE (PF) 0.25% -1:200000 IJ SOLN
INTRAMUSCULAR | Status: DC | PRN
  Administered 2023-07-05: 30 mL

## 2023-07-05 MED ORDER — KETOROLAC TROMETHAMINE 30 MG/ML IJ SOLN
INTRAMUSCULAR | Status: AC
  Filled 2023-07-05: qty 1

## 2023-07-05 MED ORDER — MIDAZOLAM HCL 2 MG/2ML IJ SOLN
INTRAMUSCULAR | Status: DC | PRN
  Administered 2023-07-05: 2 mg via INTRAVENOUS

## 2023-07-05 MED ORDER — DEXMEDETOMIDINE HCL IN NACL 80 MCG/20ML IV SOLN
INTRAVENOUS | Status: DC | PRN
Start: 2023-07-05 — End: 2023-07-05
  Administered 2023-07-05: 12 ug via INTRAVENOUS

## 2023-07-05 MED ORDER — ROCURONIUM BROMIDE 100 MG/10ML IV SOLN
INTRAVENOUS | Status: DC | PRN
  Administered 2023-07-05: 50 mg via INTRAVENOUS
  Administered 2023-07-05: 20 mg via INTRAVENOUS

## 2023-07-05 MED ORDER — LIDOCAINE HCL (PF) 2 % IJ SOLN
INTRAMUSCULAR | Status: AC
  Filled 2023-07-05: qty 5

## 2023-07-05 MED ORDER — GABAPENTIN 300 MG PO CAPS
ORAL_CAPSULE | ORAL | Status: AC
  Filled 2023-07-05: qty 1

## 2023-07-05 MED ORDER — IBUPROFEN 800 MG PO TABS
800.0000 mg | ORAL_TABLET | Freq: Three times a day (TID) | ORAL | 0 refills | Status: DC | PRN
  Filled 2023-07-05: qty 30, 10d supply, fill #0

## 2023-07-05 MED ORDER — OXYCODONE HCL 5 MG PO TABS
ORAL_TABLET | ORAL | Status: AC
  Filled 2023-07-05: qty 1

## 2023-07-05 MED ORDER — INSULIN ASPART 100 UNIT/ML IJ SOLN
INTRAMUSCULAR | Status: AC
  Filled 2023-07-05: qty 1

## 2023-07-05 MED ORDER — CEFAZOLIN SODIUM-DEXTROSE 2-4 GM/100ML-% IV SOLN
INTRAVENOUS | Status: AC
  Filled 2023-07-05: qty 100

## 2023-07-05 MED ORDER — INSULIN ASPART 100 UNIT/ML IJ SOLN
5.0000 [IU] | Freq: Once | INTRAMUSCULAR | Status: AC
  Administered 2023-07-05: 5 [IU] via SUBCUTANEOUS

## 2023-07-05 MED ORDER — ACETAMINOPHEN 500 MG PO TABS
ORAL_TABLET | ORAL | Status: AC
  Filled 2023-07-05: qty 2

## 2023-07-05 MED ORDER — INDOCYANINE GREEN 25 MG IV SOLR
INTRAVENOUS | Status: AC
  Filled 2023-07-05: qty 10

## 2023-07-05 MED ORDER — DEXAMETHASONE SODIUM PHOSPHATE 10 MG/ML IJ SOLN
INTRAMUSCULAR | Status: DC | PRN
  Administered 2023-07-05: 10 mg via INTRAVENOUS

## 2023-07-05 MED ORDER — PROPOFOL 10 MG/ML IV BOLUS
INTRAVENOUS | Status: DC | PRN
  Administered 2023-07-05: 50 ug/kg/min via INTRAVENOUS
  Administered 2023-07-05: 200 mg via INTRAVENOUS

## 2023-07-05 MED ORDER — ONDANSETRON HCL 4 MG/2ML IJ SOLN
INTRAMUSCULAR | Status: DC | PRN
  Administered 2023-07-05: 4 mg via INTRAVENOUS

## 2023-07-05 MED ORDER — ROCURONIUM BROMIDE 10 MG/ML (PF) SYRINGE
PREFILLED_SYRINGE | INTRAVENOUS | Status: AC
  Filled 2023-07-05: qty 10

## 2023-07-05 MED ORDER — OXYCODONE HCL 5 MG PO TABS
5.0000 mg | ORAL_TABLET | Freq: Once | ORAL | Status: AC | PRN
  Administered 2023-07-05: 5 mg via ORAL

## 2023-07-05 MED ORDER — DEXAMETHASONE SODIUM PHOSPHATE 10 MG/ML IJ SOLN
INTRAMUSCULAR | Status: AC
  Filled 2023-07-05: qty 1

## 2023-07-05 SURGICAL SUPPLY — 48 items
ADH SKN CLS APL DERMABOND .7 (GAUZE/BANDAGES/DRESSINGS) ×1
BAG PRESSURE INF REUSE 3000 (BAG) IMPLANT
CLIP LIGATING HEM O LOK PURPLE (MISCELLANEOUS) ×1 IMPLANT
COVER TIP SHEARS 8 DVNC (MISCELLANEOUS) ×1 IMPLANT
DERMABOND ADVANCED .7 DNX12 (GAUZE/BANDAGES/DRESSINGS) ×1 IMPLANT
DRAPE ARM DVNC X/XI (DISPOSABLE) ×4 IMPLANT
DRAPE COLUMN DVNC XI (DISPOSABLE) ×1 IMPLANT
ELECT CAUTERY BLADE 6.4 (BLADE) ×1 IMPLANT
FORCEPS BPLR R/ABLATION 8 DVNC (INSTRUMENTS) ×1 IMPLANT
FORCEPS PROGRASP DVNC XI (FORCEP) ×1 IMPLANT
GLOVE ORTHO TXT STRL SZ7.5 (GLOVE) ×2 IMPLANT
GOWN STRL REUS W/ TWL LRG LVL3 (GOWN DISPOSABLE) ×2 IMPLANT
GOWN STRL REUS W/ TWL XL LVL3 (GOWN DISPOSABLE) ×2 IMPLANT
GOWN STRL REUS W/TWL LRG LVL3 (GOWN DISPOSABLE) ×1
GOWN STRL REUS W/TWL XL LVL3 (GOWN DISPOSABLE) ×2
GRASPER SUT TROCAR 14GX15 (MISCELLANEOUS) ×1 IMPLANT
IRRIGATION STRYKERFLOW (MISCELLANEOUS) IMPLANT
IRRIGATOR STRYKERFLOW (MISCELLANEOUS)
IRRIGATOR SUCT 8 DISP DVNC XI (IRRIGATION / IRRIGATOR) IMPLANT
IV NS 1000ML (IV SOLUTION) ×1
IV NS 1000ML BAXH (IV SOLUTION) IMPLANT
IV NS IRRIG 3000ML ARTHROMATIC (IV SOLUTION) IMPLANT
KIT PINK PAD W/HEAD ARE REST (MISCELLANEOUS) ×1
KIT PINK PAD W/HEAD ARM REST (MISCELLANEOUS) ×1 IMPLANT
KIT TURNOVER KIT A (KITS) ×1 IMPLANT
LABEL OR SOLS (LABEL) ×1 IMPLANT
MANIFOLD NEPTUNE II (INSTRUMENTS) ×1 IMPLANT
NDL HYPO 22X1.5 SAFETY MO (MISCELLANEOUS) ×1 IMPLANT
NDL INSUFFLATION 14GA 120MM (NEEDLE) ×1 IMPLANT
NEEDLE HYPO 22X1.5 SAFETY MO (MISCELLANEOUS) ×1 IMPLANT
NEEDLE INSUFFLATION 14GA 120MM (NEEDLE) ×1 IMPLANT
NS IRRIG 500ML POUR BTL (IV SOLUTION) ×1 IMPLANT
PACK LAP CHOLECYSTECTOMY (MISCELLANEOUS) ×1 IMPLANT
SCISSORS MNPLR CVD DVNC XI (INSTRUMENTS) ×1 IMPLANT
SEAL UNIV 5-12 XI (MISCELLANEOUS) ×4 IMPLANT
SET TUBE SMOKE EVAC HIGH FLOW (TUBING) ×1 IMPLANT
SOL ELECTROSURG ANTI STICK (MISCELLANEOUS) ×1
SOLUTION ELECTROSURG ANTI STCK (MISCELLANEOUS) ×1 IMPLANT
SPIKE FLUID TRANSFER (MISCELLANEOUS) ×1 IMPLANT
SUT MNCRL 4-0 (SUTURE) ×1
SUT MNCRL 4-0 27XMFL (SUTURE) ×1
SUT VICRYL 0 UR6 27IN ABS (SUTURE) ×1 IMPLANT
SUTURE MNCRL 4-0 27XMF (SUTURE) ×1 IMPLANT
SYS BAG RETRIEVAL 10MM (BASKET) ×1
SYSTEM BAG RETRIEVAL 10MM (BASKET) ×1 IMPLANT
TRAP FLUID SMOKE EVACUATOR (MISCELLANEOUS) ×1 IMPLANT
TROCAR Z-THREAD FIOS 11X100 BL (TROCAR) ×1 IMPLANT
WATER STERILE IRR 500ML POUR (IV SOLUTION) ×1 IMPLANT

## 2023-07-05 NOTE — Anesthesia Procedure Notes (Signed)
Procedure Name: Intubation Date/Time: 07/05/2023 9:36 AM  Performed by: Lysbeth Penner, CRNAPre-anesthesia Checklist: Patient identified, Emergency Drugs available, Suction available and Patient being monitored Patient Re-evaluated:Patient Re-evaluated prior to induction Oxygen Delivery Method: Circle system utilized Preoxygenation: Pre-oxygenation with 100% oxygen Induction Type: IV induction Ventilation: Mask ventilation without difficulty Laryngoscope Size: McGraph and 3 Grade View: Grade I Tube type: Oral Tube size: 6.5 mm Number of attempts: 1 Airway Equipment and Method: Stylet and Oral airway Placement Confirmation: ETT inserted through vocal cords under direct vision, positive ETCO2 and breath sounds checked- equal and bilateral Secured at: 21 cm Tube secured with: Tape Dental Injury: Teeth and Oropharynx as per pre-operative assessment

## 2023-07-05 NOTE — Discharge Instructions (Signed)

## 2023-07-05 NOTE — Op Note (Signed)
Robotic cholecystectomy with Indocyamine Green Ductal Imaging.   Pre-operative Diagnosis: Chronic calculus cholecystitis  Post-operative Diagnosis:  Same.  Procedure: Robotic assisted laparoscopic cholecystectomy with Indocyamine Green Ductal Imaging.   Surgeon: Campbell Lerner, M.D., FACS  Anesthesia: General. with endotracheal tube  Findings: black flaky microlithiasis  Estimated Blood Loss: 10 mL         Drains: None         Specimens: Gallbladder           Complications: none  Procedure Details  The patient was seen again in the Holding Room.  1.25 mg dose of ICG was administered intravenously.   The benefits, complications, treatment options, risks and expected outcomes were again reviewed with the patient. The likelihood of improving the patient's symptoms with return to their baseline status is good.  The patient and/or family concurred with the proposed plan, giving informed consent, again alternatives reviewed.  The patient was taken to Operating Room, identified, and the procedure verified as robotic assisted laparoscopic cholecystectomy.  Prior to the induction of general anesthesia, antibiotic prophylaxis was administered. VTE prophylaxis was in place. General endotracheal anesthesia was then administered and tolerated well. The patient was positioned in the supine position.  After the induction, the abdomen was prepped with Chloraprep and draped in the sterile fashion.  A Time Out was held and the above information confirmed.   Local infiltration with a mixture of Exparel and 0.25% Marcaine with epinephrine is utilized for all skin incisions.  Made a 12 mm incision on the right periumbilical site, I advanced an optical 11mm port under direct visualization into the peritoneal cavity.  Once the peritoneum was penetrated, insufflation was initiated.  The trocar was then advanced into the abdominal cavity under direct visualization. Pneumoperitoneum was then continued  utilizing CO2 at 15 mmHg or less and tolerated well without any adverse changes in the patient's vital signs.  Two 8.5-mm ports were placed in the left lower quadrant and laterally, and one to the right lower quadrant, all under direct vision. Local infiltration with a mixture of Exparel and 0.25% Marcaine with epinephrine is utilized for all port sites with deep infiltration under visualization.   The patient was positioned  in reverse Trendelenburg, tilted the patient's left side down.  Da Vinci XI robot was then positioned on to the patient's left side, and docked.  The gallbladder was identified, the fundus grasped via the arm 4 Prograsp and retracted cephalad. Adhesions were lysed with scissors and cautery.  The infundibulum was identified grasped and retracted laterally, exposing the peritoneum overlying the triangle of Calot. This was then opened and dissected using cautery & scissors. An extended critical view of the cystic duct and cystic artery was obtained, aided minimally by the ICG and FireFly due to some drainage at the gallbladder neck.    The cystic duct, however was clearly identified and dissected to isolation.   Artery well isolated and clipped, and the cystic duct was triple clipped and divided with scissors, as close to the gallbladder neck as feasible, thus leaving two on the remaining stump.  The specimen side of the artery is sealed with bipolar and divided with monopolar scissors.   The gallbladder was taken from the gallbladder fossa in a retrograde fashion with the electrocautery. The gallbladder was removed and placed in an Endocatch bag.  The liver bed is inspected. Hemostasis was confirmed.  The robot was undocked and moved away from the operative field. 1 liter NSS irrigation was utilized and  was aspirated clear.  The gallbladder and Endocatch sac were then removed through the paraumbilical port site.   Inspection of the right upper quadrant was performed. No bleeding, bile  duct injury or leak, or bowel injury was noted. The infra-umbilical port site fascia was closed with interrumpted 0 Vicryl sutures using PMI/cone under direct visualization. Pneumoperitoneum was released and ports removed.  4-0 subcuticular Monocryl was used to close the skin. Dermabond was  applied.  The patient was then extubated and brought to the recovery room in stable condition. Sponge, lap, and needle counts were correct at closure and at the conclusion of the case.               Campbell Lerner, M.D., [facility] 07/05/2023 11:18 AM

## 2023-07-05 NOTE — Anesthesia Postprocedure Evaluation (Signed)
Anesthesia Post Note  Patient: Shamarion Coots  Procedure(s) Performed: XI ROBOTIC ASSISTED LAPAROSCOPIC CHOLECYSTECTOMY INDOCYANINE GREEN FLUORESCENCE IMAGING (ICG)  Patient location during evaluation: PACU Anesthesia Type: General Level of consciousness: awake and alert Pain management: pain level controlled Vital Signs Assessment: post-procedure vital signs reviewed and stable Respiratory status: spontaneous breathing, nonlabored ventilation, respiratory function stable and patient connected to nasal cannula oxygen Cardiovascular status: blood pressure returned to baseline and stable Postop Assessment: no apparent nausea or vomiting Anesthetic complications: no   There were no known notable events for this encounter.   Last Vitals:  Vitals:   07/05/23 1149 07/05/23 1152  BP:    Pulse: 88 83  Resp: 14 12  Temp:    SpO2: 97% 98%    Last Pain:  Vitals:   07/05/23 1149  TempSrc:   PainSc: 7                  Cleda Mccreedy Kaylen Nghiem

## 2023-07-05 NOTE — Anesthesia Preprocedure Evaluation (Signed)
Anesthesia Evaluation  Patient identified by MRN, date of birth, ID band Patient awake    Reviewed: Allergy & Precautions, NPO status , Patient's Chart, lab work & pertinent test results  History of Anesthesia Complications (+) PONV and history of anesthetic complications  Airway Mallampati: III  TM Distance: <3 FB Neck ROM: full    Dental  (+) Chipped   Pulmonary neg shortness of breath, Current Smoker and Patient abstained from smoking.   Pulmonary exam normal        Cardiovascular Exercise Tolerance: Good (-) angina (-) Past MI and (-) DOE negative cardio ROS Normal cardiovascular exam     Neuro/Psych  PSYCHIATRIC DISORDERS       Neuromuscular disease    GI/Hepatic negative GI ROS, Neg liver ROS,,,  Endo/Other  diabetes, Type 2    Renal/GU      Musculoskeletal   Abdominal   Peds  Hematology negative hematology ROS (+)   Anesthesia Other Findings Past Medical History: No date: Abnormal CT of the abdomen No date: Abscess of left axilla No date: Acute pancreatitis No date: Anxiety No date: Bilateral carpal tunnel syndrome No date: Calculus of gallbladder without cholecystitis without  obstruction No date: Chicken pox No date: Cholelithiasis No date: Cigarette nicotine dependence without complication No date: Depression No date: Diabetes mellitus without complication (HCC)     Comment:  Type 2 No date: Dizziness No date: DM (diabetes mellitus) (HCC) No date: Elevated liver function tests No date: Gall stones No date: Hay fever No date: Hyperlipidemia No date: Hypertriglyceridemia No date: Morbid obesity (HCC) 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 No date: Pancreatic necrosis No date: Pancreatitis No date: PONV (postoperative nausea and vomiting)  Past Surgical History: 10/18/2021: CARPAL TUNNEL RELEASE; Right     Comment:  Procedure: Right CARPAL TUNNEL RELEASE;  Surgeon:               Marlyne Beards, MD;  Location: [facility];  Service: Orthopedics;  Laterality: Right; 11/01/2021: CARPAL TUNNEL RELEASE; Left     Comment:  Procedure: Left CARPAL TUNNEL RELEASE;  Surgeon:               Marlyne Beards, MD;  Location: [facility];  Service:               Orthopedics;  Laterality: Left; 1995: REFRACTIVE SURGERY  BMI    Body Mass Index: 34.89 kg/m      Reproductive/Obstetrics negative OB ROS                             Anesthesia Physical Anesthesia Plan  ASA: 3  Anesthesia Plan: General ETT   Post-op Pain Management:    Induction: Intravenous  PONV Risk Score and Plan: Ondansetron, Dexamethasone, Midazolam and Treatment may vary due to age or medical condition  Airway Management Planned: Oral ETT  Additional Equipment:   Intra-op Plan:   Post-operative Plan: Extubation in OR  Informed Consent: I have reviewed the patients History and Physical, chart, labs and discussed the procedure including the risks, benefits and alternatives for the proposed anesthesia with the patient or authorized representative who has indicated his/her understanding and acceptance.     Dental Advisory Given  Plan Discussed with: Anesthesiologist, CRNA and Surgeon  Anesthesia Plan Comments: (Patient consented for risks of anesthesia including but not limited to:  - adverse reactions to medications -  damage to eyes, teeth, lips or other oral mucosa - nerve damage due to positioning  - sore throat or hoarseness - Damage to heart, brain, nerves, lungs, other parts of body or loss of life  Patient voiced understanding and assent.)       Anesthesia Quick Evaluation

## 2023-07-05 NOTE — Transfer of Care (Signed)
Immediate Anesthesia Transfer of Care Note  Patient: George Howe  Procedure(s) Performed: XI ROBOTIC ASSISTED LAPAROSCOPIC CHOLECYSTECTOMY INDOCYANINE GREEN FLUORESCENCE IMAGING (ICG)  Patient Location: PACU  Anesthesia Type:General  Level of Consciousness: drowsy  Airway & Oxygen Therapy: Patient Spontanous Breathing  Post-op Assessment: Report given to RN and Post -op Vital signs reviewed and stable  Post vital signs: Reviewed and stable  Last Vitals:  Vitals Value Taken Time  BP 159/103 07/05/23 1116  Temp    Pulse 91 07/05/23 1119  Resp 16 07/05/23 1119  SpO2 96 % 07/05/23 1119  Vitals shown include unfiled device data.  Last Pain:  Vitals:   07/05/23 0823  TempSrc: Oral         Complications: There were no known notable events for this encounter.

## 2023-07-05 NOTE — Interval H&P Note (Signed)
History and Physical Interval Note:  07/05/2023 9:01 AM  George Howe  has presented today for surgery, with the diagnosis of gallstones.  The various methods of treatment have been discussed with the patient and family. After consideration of risks, benefits and other options for treatment, the patient has consented to  Procedure(s): XI ROBOTIC ASSISTED LAPAROSCOPIC CHOLECYSTECTOMY (N/A) INDOCYANINE GREEN FLUORESCENCE IMAGING (ICG) (N/A) as a surgical intervention.  The patient's history has been reviewed, patient examined, no change in status, stable for surgery.  I have reviewed the patient's chart and labs.  Questions were answered to the patient's satisfaction.     Campbell Lerner

## 2023-07-06 ENCOUNTER — Other Ambulatory Visit: Payer: Self-pay | Admitting: Internal Medicine

## 2023-07-06 DIAGNOSIS — E1169 Type 2 diabetes mellitus with other specified complication: Secondary | ICD-10-CM

## 2023-07-08 ENCOUNTER — Other Ambulatory Visit (HOSPITAL_BASED_OUTPATIENT_CLINIC_OR_DEPARTMENT_OTHER): Payer: Self-pay

## 2023-07-08 ENCOUNTER — Other Ambulatory Visit: Payer: Self-pay

## 2023-07-08 LAB — SURGICAL PATHOLOGY

## 2023-07-08 MED ORDER — FREESTYLE LIBRE 3 SENSOR MISC
3 refills | Status: DC
  Filled 2023-07-08 (×2): qty 2, 28d supply, fill #0
  Filled 2023-07-27: qty 2, 28d supply, fill #1
  Filled 2023-08-14 – 2023-09-10 (×2): qty 2, 28d supply, fill #2
  Filled 2023-10-26 – 2023-12-02 (×2): qty 2, 28d supply, fill #3

## 2023-07-09 ENCOUNTER — Other Ambulatory Visit (HOSPITAL_BASED_OUTPATIENT_CLINIC_OR_DEPARTMENT_OTHER): Payer: Self-pay

## 2023-07-16 ENCOUNTER — Ambulatory Visit (INDEPENDENT_AMBULATORY_CARE_PROVIDER_SITE_OTHER): Payer: 59 | Admitting: Surgery

## 2023-07-16 ENCOUNTER — Encounter: Payer: Self-pay | Admitting: Surgery

## 2023-07-16 VITALS — BP 114/79 | HR 66 | Temp 98.0°F | Ht 63.0 in | Wt 200.0 lb

## 2023-07-16 DIAGNOSIS — Z09 Encounter for follow-up examination after completed treatment for conditions other than malignant neoplasm: Secondary | ICD-10-CM

## 2023-07-16 DIAGNOSIS — K8010 Calculus of gallbladder with chronic cholecystitis without obstruction: Secondary | ICD-10-CM

## 2023-07-16 DIAGNOSIS — Z9049 Acquired absence of other specified parts of digestive tract: Secondary | ICD-10-CM

## 2023-07-16 NOTE — Patient Instructions (Signed)

## 2023-07-16 NOTE — Progress Notes (Signed)
[facility] POST-OP OFFICE VISIT  07/16/2023  HPI: George Howe is a 37 y.o. male 11 days s/p robotic cholecystectomy  Vital signs: BP 114/79   Pulse 66   Temp 98 F (36.7 C)   Ht 5\' 3"  (1.6 m)   Wt 200 lb (90.7 kg)   SpO2 100%   BMI 35.43 kg/m    Physical Exam: Constitutional: He appears well. Abdomen: Incisions, well-approximated, Dermabond long gone.  No evidence of ecchymosis or erythema.  Abdomen soft nontender.  Assessment/Plan: This is a 37 y.o. 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  These limitations should be in effect for about 4 weeks.  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   Campbell Lerner M.D., FACS 07/16/2023, 1:00 PM

## 2023-07-17 ENCOUNTER — Encounter: Payer: 59 | Admitting: Physician Assistant

## 2023-07-22 ENCOUNTER — Other Ambulatory Visit (HOSPITAL_BASED_OUTPATIENT_CLINIC_OR_DEPARTMENT_OTHER): Payer: Self-pay

## 2023-07-27 ENCOUNTER — Other Ambulatory Visit (HOSPITAL_BASED_OUTPATIENT_CLINIC_OR_DEPARTMENT_OTHER): Payer: Self-pay

## 2023-07-29 ENCOUNTER — Other Ambulatory Visit (HOSPITAL_BASED_OUTPATIENT_CLINIC_OR_DEPARTMENT_OTHER): Payer: Self-pay

## 2023-07-29 ENCOUNTER — Other Ambulatory Visit: Payer: Self-pay

## 2023-07-31 ENCOUNTER — Other Ambulatory Visit: Payer: Self-pay

## 2023-07-31 ENCOUNTER — Encounter: Payer: Self-pay | Admitting: Internal Medicine

## 2023-07-31 ENCOUNTER — Ambulatory Visit: Payer: 59 | Admitting: Internal Medicine

## 2023-07-31 ENCOUNTER — Other Ambulatory Visit (HOSPITAL_BASED_OUTPATIENT_CLINIC_OR_DEPARTMENT_OTHER): Payer: Self-pay

## 2023-07-31 VITALS — BP 116/74 | HR 94 | Ht 63.0 in | Wt 201.0 lb

## 2023-07-31 DIAGNOSIS — E785 Hyperlipidemia, unspecified: Secondary | ICD-10-CM

## 2023-07-31 DIAGNOSIS — E1165 Type 2 diabetes mellitus with hyperglycemia: Secondary | ICD-10-CM

## 2023-07-31 DIAGNOSIS — Z794 Long term (current) use of insulin: Secondary | ICD-10-CM

## 2023-07-31 LAB — POCT GLYCOSYLATED HEMOGLOBIN (HGB A1C): Hemoglobin A1C: 9.3 % — AB (ref 4.0–5.6)

## 2023-07-31 MED ORDER — NOVOLOG FLEXPEN 100 UNIT/ML ~~LOC~~ SOPN
PEN_INJECTOR | SUBCUTANEOUS | 3 refills | Status: DC
  Filled 2023-07-31: qty 45, fill #0
  Filled 2023-10-26: qty 45, 90d supply, fill #0

## 2023-07-31 MED ORDER — PEN NEEDLES 32G X 4 MM MISC
1.0000 | Freq: Four times a day (QID) | 3 refills | Status: DC
Start: 2023-07-31 — End: 2024-04-13
  Filled 2023-07-31: qty 400, 25d supply, fill #0
  Filled 2023-07-31: qty 400, 90d supply, fill #0
  Filled 2023-07-31: qty 100, 30d supply, fill #0
  Filled 2023-10-26: qty 100, 25d supply, fill #0
  Filled 2023-12-02: qty 100, 30d supply, fill #0
  Filled 2024-03-10: qty 100, 30d supply, fill #1

## 2023-07-31 MED ORDER — METFORMIN HCL ER 500 MG PO TB24
500.0000 mg | ORAL_TABLET | Freq: Every day | ORAL | 3 refills | Status: DC
  Filled 2023-07-31: qty 90, 90d supply, fill #0
  Filled 2023-10-26 – 2023-12-02 (×2): qty 90, 90d supply, fill #1

## 2023-07-31 MED ORDER — TRESIBA FLEXTOUCH 100 UNIT/ML ~~LOC~~ SOPN
40.0000 [IU] | PEN_INJECTOR | Freq: Every day | SUBCUTANEOUS | 3 refills | Status: DC
Start: 2023-07-31 — End: 2024-04-03
  Filled 2023-07-31: qty 15, 37d supply, fill #0
  Filled 2023-10-26 – 2023-12-02 (×2): qty 45, 90d supply, fill #0

## 2023-07-31 NOTE — Progress Notes (Signed)
Name: George Howe  MRN/ DOB: [id number], 1986/06/04   Age/ Sex: 37 y.o., male    PCP: [name], [name], MD   Reason for Endocrinology Evaluation: Type 2 Diabetes Mellitus     Date of Initial Endocrinology Visit: 07/31/2023     PATIENT IDENTIFIER: George Howe is a 37 y.o. male with a past medical history of DM, pancreatitis. The patient presented for initial endocrinology clinic visit on 07/31/2023 for consultative assistance with his diabetes management.    HPI: George Howe was    Diagnosed with DM 2021 Prior Medications tried/Intolerance: just insulin  Currently checking blood sugars multiple x / day Hypoglycemia episodes : 0                Hemoglobin A1c has ranged from 6.5% in 2022, peaking at 12.1% in 2022. Patient with history of DKA in 2022  In terms of diet, the patient  eats 3 meals a day, snacks in between. 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  He is s/p cholecystectomy 07/16/2023   Takes Novolog an hour after the meal  Denies nausea or vomiting  Denies constipation or diarrhea  Denies fever    Was on lipid lowering agents but these were discontinued due to elevated LFT's , Follows with heartcare     HOME DIABETES REGIMEN: Basaglar 36 units daily  NovoLog 4 units TIDQAC    Statin: no ACE-I/ARB: no  CONTINUOUS GLUCOSE MONITORING RECORD INTERPRETATION    Dates of Recording: 10/24-11/02/2023  Sensor description:freestyle libre   Results statistics:   CGM use % of time 97  Average and SD 291/25.3  Time in range    6    %  % Time Above 180 22  % Time above 250 72  % Time Below target 0   Glycemic patterns summary: Hyperglycemia noted throughout the day and night  Hyperglycemic episodes all day and night  Hypoglycemic episodes occurred N/A  Overnight periods: High     DIABETIC COMPLICATIONS: Microvascular complications:  Denies: CKD , neuropathy  Last eye exam:  Completed   Macrovascular complications:   Denies: CAD, PVD, CVA   PAST HISTORY: Past Medical History:  Past Medical History:  Diagnosis Date   Abnormal CT of the abdomen    Abscess of left axilla    Acute pancreatitis    Anxiety    Bilateral carpal tunnel syndrome    Calculus of gallbladder without cholecystitis without obstruction    Chicken pox    Cholelithiasis    Cigarette nicotine dependence without complication    Depression    Diabetes mellitus without complication (HCC)    Type 2   Dizziness    DM (diabetes mellitus) (HCC)    Elevated liver function tests    Gall stones    Hay fever    Hyperlipidemia    Hypertriglyceridemia    Morbid obesity (HCC)    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   Pancreatic necrosis    Pancreatitis    PONV (postoperative nausea and vomiting)    Past Surgical History:  Past Surgical History:  Procedure Laterality Date   CARPAL TUNNEL RELEASE Right 10/18/2021   Procedure: Right CARPAL TUNNEL RELEASE;  Surgeon: Marlyne Beards, MD;  Location: [facility];  Service: Orthopedics;  Laterality: Right;   CARPAL TUNNEL RELEASE Left 11/01/2021   Procedure: Left CARPAL TUNNEL RELEASE;  Surgeon: Marlyne Beards, MD;  Location: [facility] OR;  Service: Orthopedics;  Laterality: Left;   REFRACTIVE SURGERY  1995    Social History:  reports that he has been smoking cigarettes. He has a 18.5 pack-year smoking history. He has been exposed to tobacco smoke. He has never used smokeless tobacco. He reports that he does not currently use alcohol. He reports current drug use. Drug: Marijuana. Family History:  Family History  Problem Relation Age of Onset   Diabetes Father    Diabetes Paternal Grandmother      HOME MEDICATIONS: Allergies as of 07/31/2023   No Known Allergies      Medication List        Accurate as of July 31, 2023  2:50 PM. If you have any questions, ask your nurse or doctor.          STOP taking these  medications    Basaglar KwikPen 100 UNIT/ML Stopped by: Johnney Ou Lorrane Mccay   ibuprofen 800 MG tablet Commonly known as: ADVIL Stopped by: Johnney Ou Elley Harp       TAKE these medications    buPROPion 150 MG 24 hr tablet Commonly known as: Wellbutrin XL Take 1 tablet (150 mg total) by mouth daily.   cetirizine 10 MG tablet Commonly known as: ZYRTEC Take 1 tablet (10 mg total) by mouth daily as needed for allergies.   FreeStyle Libre 3 Sensor Misc Place 1 sensor on the skin every 14 days. Use to check glucose continuously   Gvoke HypoPen 1-Pack 1 MG/0.2ML Soaj Generic drug: Glucagon 1 mg (1 mL) injected subcutaneously or intramuscularly into the upper arm, thigh, or buttocks, or intravenously for hypoglycemia   metFORMIN 500 MG 24 hr tablet Commonly known as: GLUCOPHAGE-XR Take 1 tablet (500 mg total) by mouth daily with breakfast. Started by: Johnney Ou Jomayra Novitsky   multivitamin with minerals tablet Take 1 tablet by mouth daily.   NovoLOG FlexPen 100 UNIT/ML FlexPen Generic drug: insulin aspart Inject Max 45 units daily subcutaneously. What changed: additional instructions Changed by: Johnney Ou Darryel Diodato   Pen Needles 32G X 4 MM Misc 1 Device by Other route in the morning, at noon, in the evening, and at bedtime. Use 1 pen needle to inject insulin 4 times per day. What changed:  how much to take how to take this when to take this Changed by: Scarlette Shorts   Evaristo Bury FlexTouch 100 UNIT/ML FlexTouch Pen Generic drug: insulin degludec Inject 40 Units into the skin daily. Started by: Johnney Ou Lorayne Getchell         ALLERGIES: No Known Allergies   REVIEW OF SYSTEMS: A comprehensive ROS was conducted with the patient and is negative except as per HPI    OBJECTIVE:   VITAL SIGNS: BP 116/74 (BP Location: Right Arm, Patient Position: Sitting, Cuff Size: Large)   Pulse 94   Ht 5\' 3"  (1.6 m)   Wt 201 lb (91.2 kg)   SpO2 97%   BMI 35.61 kg/m     PHYSICAL EXAM:  General: Pt appears well and is in NAD  Neck: General: Supple without adenopathy or carotid bruits. Thyroid: Thyroid size normal.  No goiter or nodules appreciated.   Lungs: Clear with good BS bilat   Heart: RRR   Abdomen:  soft, nontender  Extremities:  Lower extremities - No pretibial edema.   Neuro: MS is good with appropriate affect, pt is alert and Ox3    DM foot exam: 07/31/2023  The skin of the feet is intact without sores or ulcerations. The pedal pulses are 2+ on right and 2+ on left. The sensation is  intact to a screening 5.07, 10 gram monofilament bilaterally   DATA REVIEWED:  Lab Results  Component Value Date   HGBA1C 9.3 (A) 07/31/2023   HGBA1C 8.0 (A) 04/02/2023   HGBA1C 10.8 (H) 02/01/2023    Latest Reference Range & Units 06/28/23 08:26  Sodium 135 - 145 mmol/L 136  Potassium 3.5 - 5.1 mmol/L 4.0  Chloride 98 - 111 mmol/L 103  CO2 22 - 32 mmol/L 24  Glucose 70 - 99 mg/dL 098 (H)  BUN 6 - 20 mg/dL 17  Creatinine 1.19 - 1.47 mg/dL 8.29  Calcium 8.9 - 56.2 mg/dL 9.1  Anion gap 5 - 15  9  GFR, Estimated >60 mL/min >60  (H): Data is abnormally high   ASSESSMENT / PLAN / RECOMMENDATIONS:   1) Type 2 Diabetes Mellitus, Poorly controlled, Without complications - Most recent A1c of 9.3 %. Goal A1c < 7.0 %.       I have discussed with the patient the pathophysiology of diabetes. We went over the natural progression of the disease. We stressed the importance of lifestyle changes. I explained the complications associated with diabetes including retinopathy, nephropathy, neuropathy as well as increased risk of cardiovascular disease. We went over the benefit seen with glycemic control.   I explained to the patient that diabetic patients are at higher than normal risk for amputations.  He will lose insurance the end of this year , has plenty of insulin, he would not be able to get freestyle libre, patient advised to use glucose meter for  fingerstick I have recommended metformin, caution against GI side effects Not a candidate for SGLT2 inhibitors due to history of DKA 2022 Mariella Saa has been causing burning sensation, will try [place]  MEDICATIONS: Switch Basaglar to [place] 40 units daily Increase NovoLog 6 units with each meal CF: Novolog (BG -130/30) TIDQAC  EDUCATION / INSTRUCTIONS: BG monitoring instructions: Patient is instructed to check his blood sugars 3 times a day. Call [facility] if: BG persistently < 70  I reviewed the Rule of 15 for the treatment of hypoglycemia in detail with the patient. Literature supplied.   2) Diabetic complications:  Eye: Does not have known diabetic retinopathy.  Neuro/ Feet: Does not  have known diabetic peripheral neuropathy. Renal: Patient does not have known baseline CKD. He is not on an ACEI/ARB at present.  3) Hypertriglyceridemia:   -Patient has a history of elevated triglycerides 01/2023 > 5000 Mg/DL -Patient follows with cardiology -He was on lipid-lowering agents but developed elevated LFTs prior to cholecystectomy      Signed electronically by: Lyndle Herrlich, MD  [facility]  [facility] [address] [place] [place], [place] [address] Phone: [phone] FAX: [phone]   CC: [name], [name], MD [address] [place] [address] Phone: [phone]  Fax: [phone]    Return to Endocrinology clinic as below: Future Appointments  Date Time Provider Department Center  11/11/2023  8:30 AM Geonna Lockyer, Konrad Dolores, MD [id number] None

## 2023-07-31 NOTE — Patient Instructions (Addendum)
Start Metformin 1 tablet daily with Breakfast for 1 week, then increase to 1 tablet with Breakfast and 1 tablet with Supper for  1 week Increase Basaglar/ Tresiba  40 units once daily  Novolog 6 units with each meal  Novolog correctional insulin: ADD extra units on insulin to your meal-time Novolog  dose if your blood sugars are higher than 160. Use the scale below to help guide you:   Blood sugar before meal Number of units to inject  Less than 160 0 unit  161 -  190 1 units  191 -  220 2 units  221 -  250 3 units  251 -  280 4 units  281 -  310 5 units  311 -  340 6 units  341 -  370 7 units  371 -  400 8 units  401 - 430 9 units      HOW TO TREAT LOW BLOOD SUGARS (Blood sugar LESS THAN 70 MG/DL) Please follow the RULE OF 15 for the treatment of hypoglycemia treatment (when your (blood sugars are less than 70 mg/dL)   STEP 1: Take 15 grams of carbohydrates when your blood sugar is low, which includes:  3-4 GLUCOSE TABS  OR 3-4 OZ OF JUICE OR REGULAR SODA OR ONE TUBE OF GLUCOSE GEL    STEP 2: RECHECK blood sugar in 15 MINUTES STEP 3: If your blood sugar is still low at the 15 minute recheck --> then, go back to STEP 1 and treat AGAIN with another 15 grams of carbohydrates.

## 2023-08-01 ENCOUNTER — Other Ambulatory Visit: Payer: Self-pay

## 2023-08-01 ENCOUNTER — Other Ambulatory Visit (HOSPITAL_BASED_OUTPATIENT_CLINIC_OR_DEPARTMENT_OTHER): Payer: Self-pay

## 2023-08-15 ENCOUNTER — Other Ambulatory Visit (HOSPITAL_BASED_OUTPATIENT_CLINIC_OR_DEPARTMENT_OTHER): Payer: Self-pay

## 2023-08-26 ENCOUNTER — Other Ambulatory Visit (HOSPITAL_BASED_OUTPATIENT_CLINIC_OR_DEPARTMENT_OTHER): Payer: Self-pay

## 2023-08-27 ENCOUNTER — Other Ambulatory Visit (HOSPITAL_BASED_OUTPATIENT_CLINIC_OR_DEPARTMENT_OTHER): Payer: Self-pay

## 2023-09-10 ENCOUNTER — Other Ambulatory Visit (HOSPITAL_BASED_OUTPATIENT_CLINIC_OR_DEPARTMENT_OTHER): Payer: Self-pay

## 2023-10-26 ENCOUNTER — Other Ambulatory Visit (HOSPITAL_BASED_OUTPATIENT_CLINIC_OR_DEPARTMENT_OTHER): Payer: Self-pay

## 2023-10-26 ENCOUNTER — Other Ambulatory Visit: Payer: Self-pay | Admitting: Internal Medicine

## 2023-10-26 DIAGNOSIS — F17210 Nicotine dependence, cigarettes, uncomplicated: Secondary | ICD-10-CM

## 2023-10-28 ENCOUNTER — Other Ambulatory Visit (HOSPITAL_BASED_OUTPATIENT_CLINIC_OR_DEPARTMENT_OTHER): Payer: Self-pay

## 2023-10-28 MED ORDER — BUPROPION HCL ER (XL) 150 MG PO TB24
150.0000 mg | ORAL_TABLET | Freq: Every day | ORAL | 1 refills | Status: DC
  Filled 2023-10-28 – 2023-12-02 (×2): qty 90, 90d supply, fill #0
  Filled 2024-03-10: qty 90, 90d supply, fill #1

## 2023-10-29 ENCOUNTER — Other Ambulatory Visit (HOSPITAL_BASED_OUTPATIENT_CLINIC_OR_DEPARTMENT_OTHER): Payer: Self-pay

## 2023-10-30 ENCOUNTER — Other Ambulatory Visit (HOSPITAL_BASED_OUTPATIENT_CLINIC_OR_DEPARTMENT_OTHER): Payer: Self-pay

## 2023-11-02 ENCOUNTER — Other Ambulatory Visit (HOSPITAL_BASED_OUTPATIENT_CLINIC_OR_DEPARTMENT_OTHER): Payer: Self-pay

## 2023-11-04 ENCOUNTER — Other Ambulatory Visit (HOSPITAL_BASED_OUTPATIENT_CLINIC_OR_DEPARTMENT_OTHER): Payer: Self-pay

## 2023-11-04 ENCOUNTER — Other Ambulatory Visit: Payer: Self-pay

## 2023-11-04 ENCOUNTER — Other Ambulatory Visit (HOSPITAL_COMMUNITY): Payer: Self-pay

## 2023-11-06 ENCOUNTER — Other Ambulatory Visit (HOSPITAL_BASED_OUTPATIENT_CLINIC_OR_DEPARTMENT_OTHER): Payer: Self-pay

## 2023-11-06 ENCOUNTER — Other Ambulatory Visit: Payer: Self-pay

## 2023-11-07 ENCOUNTER — Other Ambulatory Visit (HOSPITAL_BASED_OUTPATIENT_CLINIC_OR_DEPARTMENT_OTHER): Payer: Self-pay

## 2023-11-08 ENCOUNTER — Other Ambulatory Visit: Payer: Self-pay

## 2023-11-11 ENCOUNTER — Other Ambulatory Visit (HOSPITAL_COMMUNITY): Payer: Self-pay

## 2023-11-11 ENCOUNTER — Ambulatory Visit: Payer: 59 | Admitting: Internal Medicine

## 2023-11-12 ENCOUNTER — Telehealth: Payer: Self-pay

## 2023-11-12 NOTE — Telephone Encounter (Signed)
 Pharmacy Patient Advocate Encounter   Received notification from Fax that prior authorization for FreeStyle Libre 3 Sensor is required/requested.   Insurance verification completed.   The patient is insured through [employer] .   Per test claim: PA required; PA submitted to above mentioned insurance via CoverMyMeds Key/confirmation #/EOC [id number] Status is pending

## 2023-11-15 ENCOUNTER — Other Ambulatory Visit (HOSPITAL_BASED_OUTPATIENT_CLINIC_OR_DEPARTMENT_OTHER): Payer: Self-pay

## 2023-11-18 ENCOUNTER — Other Ambulatory Visit (HOSPITAL_COMMUNITY): Payer: Self-pay

## 2023-11-18 NOTE — Telephone Encounter (Signed)
 Pharmacy Patient Advocate Encounter  Received notification from [employer] that Prior Authorization for  FreeStyle Libre 3 Sensor  has been APPROVED from 11/12/23 to 11/17/24. Ran test claim, Copay is $30. This test claim was processed through [facility]- copay amounts may vary at other pharmacies due to pharmacy/plan contracts, or as the patient moves through the different stages of their insurance plan.   PA #/Case ID/Reference #: [id number]

## 2023-11-29 ENCOUNTER — Telehealth: Payer: Self-pay

## 2023-11-29 ENCOUNTER — Other Ambulatory Visit (HOSPITAL_COMMUNITY): Payer: Self-pay

## 2023-11-29 NOTE — Telephone Encounter (Signed)
 Pharmacy Patient Advocate Encounter   Received notification from CoverMyMeds that prior authorization for Novolog is required/requested.   Insurance verification completed.   The patient is insured through [employer] .   Per test claim:  Humalog is preferred by the insurance.  If suggested medication is appropriate, Please send in a new RX and discontinue this one. If not, please advise as to why it's not appropriate so that we may request a Prior Authorization. Please note, some preferred medications may still require a PA.  If the suggested medications have not been trialed and there are no contraindications to their use, the PA will not be submitted, as it will not be approved.   Copay for humalog is $25 for a 30 day supply

## 2023-12-02 ENCOUNTER — Other Ambulatory Visit (HOSPITAL_BASED_OUTPATIENT_CLINIC_OR_DEPARTMENT_OTHER): Payer: Self-pay

## 2023-12-02 MED ORDER — INSULIN LISPRO (1 UNIT DIAL) 100 UNIT/ML (KWIKPEN)
PEN_INJECTOR | SUBCUTANEOUS | 3 refills | Status: DC
  Filled 2023-12-02: qty 45, 90d supply, fill #0
  Filled 2024-03-10: qty 45, 90d supply, fill #1

## 2023-12-02 NOTE — Addendum Note (Signed)
 Addended by: Scarlette Shorts on: 12/02/2023 08:38 AM   Modules accepted: Orders

## 2023-12-03 ENCOUNTER — Other Ambulatory Visit (HOSPITAL_BASED_OUTPATIENT_CLINIC_OR_DEPARTMENT_OTHER): Payer: Self-pay

## 2023-12-29 ENCOUNTER — Other Ambulatory Visit: Payer: Self-pay | Admitting: Internal Medicine

## 2023-12-29 DIAGNOSIS — E1169 Type 2 diabetes mellitus with other specified complication: Secondary | ICD-10-CM

## 2023-12-30 ENCOUNTER — Other Ambulatory Visit (HOSPITAL_COMMUNITY): Payer: Self-pay

## 2023-12-30 ENCOUNTER — Other Ambulatory Visit (HOSPITAL_BASED_OUTPATIENT_CLINIC_OR_DEPARTMENT_OTHER): Payer: Self-pay

## 2023-12-30 MED ORDER — FREESTYLE LIBRE 3 SENSOR MISC
3 refills | Status: DC
  Filled 2023-12-30 – 2024-01-15 (×3): qty 2, 28d supply, fill #0
  Filled 2024-02-11: qty 2, 28d supply, fill #1
  Filled 2024-03-10: qty 2, 28d supply, fill #2

## 2024-01-09 ENCOUNTER — Other Ambulatory Visit (HOSPITAL_BASED_OUTPATIENT_CLINIC_OR_DEPARTMENT_OTHER): Payer: Self-pay

## 2024-01-14 ENCOUNTER — Other Ambulatory Visit (HOSPITAL_COMMUNITY): Payer: Self-pay

## 2024-01-15 ENCOUNTER — Other Ambulatory Visit (HOSPITAL_BASED_OUTPATIENT_CLINIC_OR_DEPARTMENT_OTHER): Payer: Self-pay

## 2024-02-24 ENCOUNTER — Emergency Department (HOSPITAL_COMMUNITY)

## 2024-02-24 ENCOUNTER — Other Ambulatory Visit (HOSPITAL_BASED_OUTPATIENT_CLINIC_OR_DEPARTMENT_OTHER): Payer: Self-pay

## 2024-02-24 ENCOUNTER — Other Ambulatory Visit: Payer: Self-pay

## 2024-02-24 ENCOUNTER — Encounter (HOSPITAL_COMMUNITY): Payer: Self-pay

## 2024-02-24 ENCOUNTER — Emergency Department (HOSPITAL_COMMUNITY)
Admission: EM | Admit: 2024-02-24 | Discharge: 2024-02-24 | Disposition: A | Discharge status: Home / Self Care | Attending: Emergency Medicine | Admitting: Emergency Medicine

## 2024-02-24 DIAGNOSIS — Z794 Long term (current) use of insulin: Secondary | ICD-10-CM | POA: Insufficient documentation

## 2024-02-24 DIAGNOSIS — Z7984 Long term (current) use of oral hypoglycemic drugs: Secondary | ICD-10-CM | POA: Insufficient documentation

## 2024-02-24 DIAGNOSIS — R1013 Epigastric pain: Secondary | ICD-10-CM

## 2024-02-24 DIAGNOSIS — E119 Type 2 diabetes mellitus without complications: Secondary | ICD-10-CM | POA: Diagnosis not present

## 2024-02-24 DIAGNOSIS — K863 Pseudocyst of pancreas: Secondary | ICD-10-CM | POA: Insufficient documentation

## 2024-02-24 DIAGNOSIS — R1012 Left upper quadrant pain: Secondary | ICD-10-CM | POA: Diagnosis present

## 2024-02-24 LAB — URINALYSIS, ROUTINE W REFLEX MICROSCOPIC
Bacteria, UA: NONE SEEN
Bilirubin Urine: NEGATIVE
Glucose, UA: >=500 mg/dL — AB
Hgb urine dipstick: NEGATIVE
Ketones, ur: 20 mg/dL — AB
Leukocytes,Ua: NEGATIVE
Nitrite: NEGATIVE
Protein, ur: NEGATIVE mg/dL
Specific Gravity, Urine: >1.046 — ABNORMAL HIGH (ref 1.005–1.030)
pH: 5 (ref 5.0–8.0)

## 2024-02-24 LAB — COMPREHENSIVE METABOLIC PANEL WITH GFR
ALT: 68 U/L — ABNORMAL HIGH (ref 0–44)
AST: 42 U/L — ABNORMAL HIGH (ref 15–41)
Albumin: 4 g/dL (ref 3.5–5.0)
Alkaline Phosphatase: 87 U/L (ref 38–126)
Anion gap: 7 (ref 5–15)
BUN: 16 mg/dL (ref 6–20)
CO2: 21 mmol/L — ABNORMAL LOW (ref 22–32)
Calcium: 8.4 mg/dL — ABNORMAL LOW (ref 8.9–10.3)
Chloride: 100 mmol/L (ref 98–111)
Creatinine, Ser: 0.85 mg/dL (ref 0.61–1.24)
GFR, Estimated: >60 mL/min (ref >60)
Glucose, Bld: 312 mg/dL — ABNORMAL HIGH (ref 70–99)
Potassium: 4.5 mmol/L (ref 3.5–5.1)
Sodium: 128 mmol/L — ABNORMAL LOW (ref 135–145)
Total Bilirubin: 2.3 mg/dL — ABNORMAL HIGH (ref 0.0–1.2)
Total Protein: 6.5 g/dL (ref 6.5–8.1)

## 2024-02-24 LAB — CBC WITH DIFFERENTIAL/PLATELET
Abs Immature Granulocytes: 0.04 10*3/uL (ref 0.00–0.07)
Basophils Absolute: 0 10*3/uL (ref 0.0–0.1)
Basophils Relative: 0 %
Eosinophils Absolute: 0.1 10*3/uL (ref 0.0–0.5)
Eosinophils Relative: 1 %
HCT: 46 % (ref 39.0–52.0)
Hemoglobin: 16 g/dL (ref 13.0–17.0)
Immature Granulocytes: 0 %
Lymphocytes Relative: 11 %
Lymphs Abs: 1.1 10*3/uL (ref 0.7–4.0)
MCH: 28.8 pg (ref 26.0–34.0)
MCHC: 34.8 g/dL (ref 30.0–36.0)
MCV: 82.7 fL (ref 80.0–100.0)
Monocytes Absolute: 0.5 10*3/uL (ref 0.1–1.0)
Monocytes Relative: 4 %
Neutro Abs: 9 10*3/uL — ABNORMAL HIGH (ref 1.7–7.7)
Neutrophils Relative %: 84 %
Platelets: 182 10*3/uL (ref 150–400)
RBC: 5.56 MIL/uL (ref 4.22–5.81)
RDW: 12.7 % (ref 11.5–15.5)
WBC: 10.7 10*3/uL — ABNORMAL HIGH (ref 4.0–10.5)
nRBC: 0 % (ref 0.0–0.2)

## 2024-02-24 LAB — LIPASE, BLOOD: Lipase: 56 U/L — ABNORMAL HIGH (ref 11–51)

## 2024-02-24 MED ORDER — ONDANSETRON HCL 4 MG/2ML IJ SOLN
4.0000 mg | Freq: Once | INTRAMUSCULAR | Status: AC
  Administered 2024-02-24: 4 mg via INTRAVENOUS
  Filled 2024-02-24: qty 2

## 2024-02-24 MED ORDER — LACTATED RINGERS IV BOLUS
1000.0000 mL | Freq: Once | INTRAVENOUS | Status: AC
  Administered 2024-02-24: 1000 mL via INTRAVENOUS

## 2024-02-24 MED ORDER — HYDROMORPHONE HCL 1 MG/ML IJ SOLN
0.5000 mg | Freq: Once | INTRAMUSCULAR | Status: AC
  Administered 2024-02-24: 0.5 mg via INTRAVENOUS
  Filled 2024-02-24: qty 1

## 2024-02-24 MED ORDER — ONDANSETRON 4 MG PO TBDP
4.0000 mg | ORAL_TABLET | Freq: Four times a day (QID) | ORAL | 0 refills | Status: DC | PRN
  Filled 2024-02-24: qty 15, 4d supply, fill #0

## 2024-02-24 MED ORDER — OXYCODONE HCL 5 MG PO TABS
5.0000 mg | ORAL_TABLET | Freq: Four times a day (QID) | ORAL | 0 refills | Status: DC | PRN
  Filled 2024-02-24: qty 6, 2d supply, fill #0

## 2024-02-24 MED ORDER — NICOTINE 21 MG/24HR TD PT24
21.0000 mg | MEDICATED_PATCH | Freq: Once | TRANSDERMAL | Status: DC
  Administered 2024-02-24: 21 mg via TRANSDERMAL
  Filled 2024-02-24: qty 1

## 2024-02-24 MED ORDER — HYDROMORPHONE HCL 1 MG/ML IJ SOLN
1.0000 mg | Freq: Once | INTRAMUSCULAR | Status: AC
  Administered 2024-02-24: 1 mg via INTRAVENOUS
  Filled 2024-02-24: qty 1

## 2024-02-24 MED ORDER — IOHEXOL 300 MG/ML  SOLN
100.0000 mL | Freq: Once | INTRAMUSCULAR | Status: AC | PRN
  Administered 2024-02-24: 100 mL via INTRAVENOUS

## 2024-02-24 NOTE — Discharge Instructions (Addendum)
 Was a pleasure taking care of you today.  You are seen for abdominal pain.  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  We are discharging you with pain medicine and nausea medicine.  If you have new or worsening symptoms please come back to the ER right away.  As discussed you will need repeat imaging of your pancreas in 3 to 6 months for the pancreatic pseudocyst to be sent.

## 2024-02-24 NOTE — ED Provider Notes (Signed)
 [place] EMERGENCY DEPARTMENT AT [facility] Provider Note   CSN: [id number] Arrival date & time: 02/24/24  0358     History  Chief Complaint  Patient presents with   Emesis    George Howe is a 38 y.o. male.  He has history of high triglycerides, diabetes, pancreatitis.  He presents to the ER for evaluation of upper abdominal pain in the epigastrium and left upper quadrant that started about an hour prior to arrival, having back pain with this and vomiting.  Feels like prior episode of pancreatitis.  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   Emesis      Home Medications Prior to Admission medications   Medication Sig Start Date End Date Taking? Authorizing Provider  ondansetron  (ZOFRAN -ODT) 4 MG disintegrating tablet Take 1 tablet (4 mg total) by mouth every 6 (six) hours as needed for nausea or vomiting. 02/24/24  Yes Armany Mano A, PA-C  oxyCODONE  (ROXICODONE ) 5 MG immediate release tablet Take 1 tablet (5 mg total) by mouth every 6 (six) hours as needed for severe pain (pain score 7-10). 02/24/24  Yes Maryjean Corpening A, PA-C  buPROPion  (WELLBUTRIN  XL) 150 MG 24 hr tablet Take 1 tablet (150 mg total) by mouth daily. 10/28/23   Zilphia Hilt, Charyl Coppersmith, MD  cetirizine  (ZYRTEC ) 10 MG tablet Take 1 tablet (10 mg total) by mouth daily as needed for allergies. 12/19/21   Zilphia Hilt, Charyl Coppersmith, MD  Continuous Glucose Sensor (FREESTYLE LIBRE 3 SENSOR) MISC Place 1 sensor on the skin every 14 days. Use to check glucose continuously 12/30/23   Zilphia Hilt, Charyl Coppersmith, MD  Glucagon  1 MG/0.2ML SOAJ 1 mg (1 mL) injected subcutaneously or intramuscularly into the upper arm, thigh, or buttocks, or intravenously for hypoglycemia 04/19/23   Zilphia Hilt, Charyl Coppersmith, MD  insulin  degludec (TRESIBA  FLEXTOUCH) 100 UNIT/ML FlexTouch Pen Inject 40 Units into the skin daily. 07/31/23   Shamleffer, Ibtehal Jaralla, MD  insulin  lispro  (HUMALOG  KWIKPEN) 100 UNIT/ML KwikPen Max daily 45 units daily 12/02/23   Shamleffer, Ibtehal Jaralla, MD  Insulin  Pen Needle (PEN NEEDLES) 32G X 4 MM MISC Use in the morning, at noon, in the evening, and at bedtime 07/31/23   Shamleffer, Ibtehal Jaralla, MD  metFORMIN  (GLUCOPHAGE -XR) 500 MG 24 hr tablet Take 1 tablet (500 mg total) by mouth daily with breakfast. 07/31/23   Shamleffer, Ibtehal Jaralla, MD  Multiple Vitamins-Minerals (MULTIVITAMIN WITH MINERALS) tablet Take 1 tablet by mouth daily.    [provider]      Allergies    Patient has no known allergies.    Review of Systems   Review of Systems  Gastrointestinal:  Positive for vomiting.    Physical Exam Updated Vital Signs BP 127/81 (BP Location: Right Arm)   Pulse 77   Temp 98.8 F (37.1 C) (Oral)   Resp 16   Ht 5\' 4"  (1.626 m)   Wt 83.9 kg   SpO2 97%   BMI 31.76 kg/m  Physical Exam Vitals and nursing note reviewed.  Constitutional:      General: He is not in acute distress.    Appearance: He is well-developed.  HENT:     Head: Normocephalic and atraumatic.     Mouth/Throat:     Mouth: Mucous membranes are moist.  Eyes:     Conjunctiva/sclera: Conjunctivae normal.  Cardiovascular:     Rate and Rhythm: Normal rate and regular rhythm.  Heart sounds: No murmur heard. Pulmonary:     Effort: Pulmonary effort is normal. No respiratory distress.     Breath sounds: Normal breath sounds.  Abdominal:     Palpations: Abdomen is soft.     Tenderness: There is no abdominal tenderness.  Musculoskeletal:        General: No swelling.     Cervical back: Neck supple.  Skin:    General: Skin is warm and dry.     Capillary Refill: Capillary refill takes less than 2 seconds.  Neurological:     General: No focal deficit present.     Mental Status: He is alert and oriented to person, place, and time.  Psychiatric:        Mood and Affect: Mood normal.     ED Results / Procedures / Treatments   Labs (all labs  ordered are listed, but only abnormal results are displayed) Labs Reviewed  COMPREHENSIVE METABOLIC PANEL WITH GFR - Abnormal; Notable for the following components:      Result Value   Sodium 128 (*)    CO2 21 (*)    Glucose, Bld 312 (*)    Calcium  8.4 (*)    AST 42 (*)    ALT 68 (*)    Total Bilirubin 2.3 (*)    All other components within normal limits  LIPASE, BLOOD - Abnormal; Notable for the following components:   Lipase 56 (*)    All other components within normal limits  CBC WITH DIFFERENTIAL/PLATELET - Abnormal; Notable for the following components:   WBC 10.7 (*)    Neutro Abs 9.0 (*)    All other components within normal limits  CBC WITH DIFFERENTIAL/PLATELET  URINALYSIS, ROUTINE W REFLEX MICROSCOPIC    EKG None  Radiology CT ABDOMEN PELVIS W CONTRAST Result Date: 02/24/2024 CLINICAL DATA:  Abdominal pain, acute. EXAM: CT ABDOMEN AND PELVIS WITH CONTRAST TECHNIQUE: Multidetector CT imaging of the abdomen and pelvis was performed using the standard protocol following bolus administration of intravenous contrast. RADIATION DOSE REDUCTION: This exam was performed according to the departmental dose-optimization program which includes automated exposure control, adjustment of the mA and/or kV according to patient size and/or use of iterative reconstruction technique. CONTRAST:  OMNIPAQUE  IOHEXOL  300 MG/ML  SOLN COMPARISON:  04/29/2023 FINDINGS: Lower chest: No acute abnormality. Hepatobiliary: Focal area of low attenuation involving segments 4 a and 4 B compatible with focal fatty deposition. No suspicious liver lesions identified. Status post cholecystectomy. No bile duct dilatation. Pancreas: Cystic mass arising off the tail of pancreas extending into the lesser sac and abutting the posterior wall of the stomach measures 6.8 x 5.9 cm, image 21/2. On the previous exam there were signs of acute pancreatitis with small poorly defined cystic lesion arising from the tail of  pancreas measuring 1.4 x 1.5 cm. Spleen: Normal in size without focal abnormality. Adrenals/Urinary Tract: Normal adrenal glands. No nephrolithiasis, hydronephrosis or mass. Urinary bladder is unremarkable. Stomach/Bowel: Stomach is nondistended. No pathologic dilatation of the large or small bowel loops. The appendix is visualized and appears normal. Vascular/Lymphatic: Normal appearance of the abdominal aorta. Upper abdominal vascularity is patent. No abdominopelvic adenopathy. Reproductive: Prostate is unremarkable. Other: No significant free fluid or fluid collections. No signs of pneumoperitoneum. Musculoskeletal: Chronic bilateral L5 pars defects. IMPRESSION: 1. No acute findings within the abdomen or pelvis. 2. Cystic mass arising off the tail of pancreas extending into the lesser sac and abutting the posterior wall of the stomach measures 6.8 x 5.9  cm. On the previous exam there were signs of acute pancreatitis with small poorly defined cystic lesion arising from the tail of pancreas measuring 1.4 x 1.5 cm. Findings are favored to represent a pseudocyst. Recommend short-term interval follow-up with pancreas protocol CT or MRI in 3 months. 3. Chronic bilateral L5 pars defects. Electronically Signed   By: Kimberley Penman M.D.   On: 02/24/2024 05:58    Procedures Procedures    Medications Ordered in ED Medications  nicotine  (NICODERM CQ  - dosed in mg/24 hours) patch 21 mg (21 mg Transdermal Patch Applied 02/24/24 0544)  ondansetron  (ZOFRAN ) injection 4 mg (4 mg Intravenous Given 02/24/24 0434)  HYDROmorphone  (DILAUDID ) injection 0.5 mg (0.5 mg Intravenous Given 02/24/24 0429)  iohexol  (OMNIPAQUE ) 300 MG/ML solution 100 mL (100 mLs Intravenous Contrast Given 02/24/24 0518)  lactated ringers  bolus 1,000 mL (1,000 mLs Intravenous New Bag/Given 02/24/24 0532)  HYDROmorphone  (DILAUDID ) injection 1 mg (1 mg Intravenous Given 02/24/24 0542)    ED Course/ Medical Decision Making/ A&P                                  Medical Decision Making Differential diagnosis:gastritis, gastroenteritis, appendicitis, cholecystitis, diverticulitis, DKA, nephrolithiasis, gastroparesis, other  Course: Patient presented with severe upper abdominal pain and back pain that feels like prior pancreatitis with nausea vomiting.  Pain much better after Dilaudid , fluids and Zofran .  Lipase is only slightly over limit of normal, CT shows pancreatic pseudocyst with recommended short interval follow-up, no acute pancreatitis.  Patient feels well and wants to go home, will give small IV medication in case she needs it, advised on close follow-up, instructed on need for close follow-up for pancreatic imaging.  Was offered admission but given pain is well-controlled he would prefer to go home.    Amount and/or Complexity of Data Reviewed External Data Reviewed: labs, radiology and notes. Labs: ordered.    Details: Mild hyponatremia, corrected sodium 131, CO2 21, AST and ALT slightly elevated, bilirubin slightly elevated 2.3.  Lipase is 56.  Significant leukocytosis or anemia Radiology: ordered.  Risk OTC drugs. Prescription drug management.           Final Clinical Impression(s) / ED Diagnoses Final diagnoses:  Epigastric pain  Pancreatic pseudocyst    Rx / DC Orders ED Discharge Orders          Ordered    oxyCODONE  (ROXICODONE ) 5 MG immediate release tablet  Every 6 hours PRN        02/24/24 0713    ondansetron  (ZOFRAN -ODT) 4 MG disintegrating tablet  Every 6 hours PRN        02/24/24 0714              Aimee Houseman, PA-C 02/24/24 0717    Alissa April, MD 02/24/24 [phone]

## 2024-02-24 NOTE — ED Triage Notes (Signed)
 Pt states he thinks he has pancreatitis again. Woke up with vomiting about an hour ago with LUQ & back pain. Denies any other s/s at time of triage.

## 2024-02-24 NOTE — ED Provider Notes (Signed)
 38 year old male with hx of pancreatitis. Here with vomiting, pain. CT recommends repeat in 3 months.  Labs reviewed, corrected Na is 131 Pending UA at sign out. Patient would like to be dc home.  Physical Exam  BP 127/81 (BP Location: Right Arm)   Pulse 77   Temp 98.8 F (37.1 C) (Oral)   Resp 16   Ht 5\' 4"  (1.626 m)   Wt 83.9 kg   SpO2 97%   BMI 31.76 kg/m   Physical Exam  Procedures  Procedures  ED Course / MDM    Medical Decision Making Amount and/or Complexity of Data Reviewed Labs: ordered. Radiology: ordered.  Risk OTC drugs. Prescription drug management.   Patient tolerating p.o.'s.  Urinalysis with glucose and ketones, no evidence of infection.  Patient discharged per plan from signout provided this morning.       Darlis Eisenmenger, PA-C 02/24/24 1610    Alissa April, MD 02/24/24 2302

## 2024-02-24 NOTE — ED Notes (Signed)
Pt was given water to drink °

## 2024-03-10 ENCOUNTER — Encounter: Payer: Self-pay | Admitting: Internal Medicine

## 2024-03-10 ENCOUNTER — Other Ambulatory Visit: Payer: Self-pay

## 2024-03-10 ENCOUNTER — Other Ambulatory Visit (HOSPITAL_BASED_OUTPATIENT_CLINIC_OR_DEPARTMENT_OTHER): Payer: Self-pay

## 2024-03-10 DIAGNOSIS — E1169 Type 2 diabetes mellitus with other specified complication: Secondary | ICD-10-CM

## 2024-03-10 MED ORDER — FREESTYLE LIBRE 3 SENSOR MISC
3 refills | Status: DC
Start: 2024-03-10 — End: 2024-04-09
  Filled 2024-03-10: qty 2, 28d supply, fill #0
  Filled 2024-03-27: qty 2, 28d supply, fill #1

## 2024-03-28 ENCOUNTER — Other Ambulatory Visit (HOSPITAL_BASED_OUTPATIENT_CLINIC_OR_DEPARTMENT_OTHER): Payer: Self-pay

## 2024-03-29 ENCOUNTER — Other Ambulatory Visit: Payer: Self-pay

## 2024-03-29 ENCOUNTER — Inpatient Hospital Stay (HOSPITAL_COMMUNITY)
Admission: EM | Admit: 2024-03-29 | Discharge: 2024-04-03 | DRG: 439 | Disposition: A | Discharge status: Home / Self Care | Attending: Internal Medicine | Admitting: Internal Medicine

## 2024-03-29 ENCOUNTER — Emergency Department (HOSPITAL_COMMUNITY)

## 2024-03-29 DIAGNOSIS — K760 Fatty (change of) liver, not elsewhere classified: Secondary | ICD-10-CM | POA: Diagnosis present

## 2024-03-29 DIAGNOSIS — E785 Hyperlipidemia, unspecified: Secondary | ICD-10-CM | POA: Diagnosis present

## 2024-03-29 DIAGNOSIS — R1012 Left upper quadrant pain: Secondary | ICD-10-CM

## 2024-03-29 DIAGNOSIS — F17210 Nicotine dependence, cigarettes, uncomplicated: Secondary | ICD-10-CM | POA: Diagnosis present

## 2024-03-29 DIAGNOSIS — R7401 Elevation of levels of liver transaminase levels: Secondary | ICD-10-CM | POA: Insufficient documentation

## 2024-03-29 DIAGNOSIS — Z833 Family history of diabetes mellitus: Secondary | ICD-10-CM

## 2024-03-29 DIAGNOSIS — Z7984 Long term (current) use of oral hypoglycemic drugs: Secondary | ICD-10-CM

## 2024-03-29 DIAGNOSIS — Z9049 Acquired absence of other specified parts of digestive tract: Secondary | ICD-10-CM

## 2024-03-29 DIAGNOSIS — K859 Acute pancreatitis without necrosis or infection, unspecified: Secondary | ICD-10-CM | POA: Diagnosis not present

## 2024-03-29 DIAGNOSIS — K862 Cyst of pancreas: Secondary | ICD-10-CM

## 2024-03-29 DIAGNOSIS — Z79899 Other long term (current) drug therapy: Secondary | ICD-10-CM

## 2024-03-29 DIAGNOSIS — E1165 Type 2 diabetes mellitus with hyperglycemia: Secondary | ICD-10-CM | POA: Diagnosis present

## 2024-03-29 DIAGNOSIS — F32A Depression, unspecified: Secondary | ICD-10-CM | POA: Diagnosis present

## 2024-03-29 DIAGNOSIS — E16A1 Hypoglycemia level 1: Secondary | ICD-10-CM | POA: Diagnosis not present

## 2024-03-29 DIAGNOSIS — E871 Hypo-osmolality and hyponatremia: Secondary | ICD-10-CM | POA: Insufficient documentation

## 2024-03-29 DIAGNOSIS — Z794 Long term (current) use of insulin: Secondary | ICD-10-CM

## 2024-03-29 DIAGNOSIS — Z6830 Body mass index (BMI) 30.0-30.9, adult: Secondary | ICD-10-CM

## 2024-03-29 DIAGNOSIS — E11649 Type 2 diabetes mellitus with hypoglycemia without coma: Secondary | ICD-10-CM | POA: Diagnosis not present

## 2024-03-29 DIAGNOSIS — E876 Hypokalemia: Secondary | ICD-10-CM | POA: Diagnosis not present

## 2024-03-29 DIAGNOSIS — K863 Pseudocyst of pancreas: Secondary | ICD-10-CM | POA: Diagnosis present

## 2024-03-29 DIAGNOSIS — E781 Pure hyperglyceridemia: Secondary | ICD-10-CM | POA: Diagnosis present

## 2024-03-29 DIAGNOSIS — E119 Type 2 diabetes mellitus without complications: Secondary | ICD-10-CM

## 2024-03-29 DIAGNOSIS — F411 Generalized anxiety disorder: Secondary | ICD-10-CM | POA: Diagnosis present

## 2024-03-29 LAB — CBC WITH DIFFERENTIAL/PLATELET
Abs Immature Granulocytes: 0.09 K/uL — ABNORMAL HIGH (ref 0.00–0.07)
Basophils Absolute: 0.1 K/uL (ref 0.0–0.1)
Basophils Relative: 0 %
Eosinophils Absolute: 0 K/uL (ref 0.0–0.5)
Eosinophils Relative: 0 %
HCT: 51.6 % (ref 39.0–52.0)
Hemoglobin: 18.3 g/dL — ABNORMAL HIGH (ref 13.0–17.0)
Immature Granulocytes: 1 %
Lymphocytes Relative: 9 %
Lymphs Abs: 1.5 K/uL (ref 0.7–4.0)
MCH: 29.2 pg (ref 26.0–34.0)
MCHC: 35.5 g/dL (ref 30.0–36.0)
MCV: 82.3 fL (ref 80.0–100.0)
Monocytes Absolute: 0.7 K/uL (ref 0.1–1.0)
Monocytes Relative: 4 %
Neutro Abs: 14.7 K/uL — ABNORMAL HIGH (ref 1.7–7.7)
Neutrophils Relative %: 86 %
Platelets: 297 K/uL (ref 150–400)
RBC: 6.27 MIL/uL — ABNORMAL HIGH (ref 4.22–5.81)
RDW: 12.5 % (ref 11.5–15.5)
WBC: 17 K/uL — ABNORMAL HIGH (ref 4.0–10.5)
nRBC: 0 % (ref 0.0–0.2)

## 2024-03-29 LAB — URINALYSIS, ROUTINE W REFLEX MICROSCOPIC
Bacteria, UA: NONE SEEN
Bilirubin Urine: NEGATIVE
Glucose, UA: >=500 mg/dL — AB
Hgb urine dipstick: NEGATIVE
Ketones, ur: 20 mg/dL — AB
Leukocytes,Ua: NEGATIVE
Nitrite: NEGATIVE
Protein, ur: 30 mg/dL — AB
Specific Gravity, Urine: 1.037 — ABNORMAL HIGH (ref 1.005–1.030)
pH: 5 (ref 5.0–8.0)

## 2024-03-29 LAB — I-STAT CHEM 8, ED
BUN: 19 mg/dL (ref 6–20)
Calcium, Ion: 1.07 mmol/L — ABNORMAL LOW (ref 1.15–1.40)
Chloride: 102 mmol/L (ref 98–111)
Creatinine, Ser: 0.9 mg/dL (ref 0.61–1.24)
Glucose, Bld: 335 mg/dL — ABNORMAL HIGH (ref 70–99)
HCT: 49 % (ref 39.0–52.0)
Hemoglobin: 16.7 g/dL (ref 13.0–17.0)
Potassium: 4.8 mmol/L (ref 3.5–5.1)
Sodium: 133 mmol/L — ABNORMAL LOW (ref 135–145)
TCO2: 22 mmol/L (ref 22–32)

## 2024-03-29 LAB — BLOOD GAS, VENOUS
Acid-Base Excess: 0.4 mmol/L (ref 0.0–2.0)
Bicarbonate: 27.1 mmol/L (ref 20.0–28.0)
O2 Saturation: 24 %
Patient temperature: 37
pCO2, Ven: 49 mmHg (ref 44–60)
pH, Ven: 7.35 (ref 7.25–7.43)
pO2, Ven: <31 mmHg — CL (ref 32–45)

## 2024-03-29 LAB — CBG MONITORING, ED: Glucose-Capillary: 321 mg/dL — ABNORMAL HIGH (ref 70–99)

## 2024-03-29 MED ORDER — MORPHINE SULFATE (PF) 4 MG/ML IV SOLN: 4.0000 mg | Freq: Once | INTRAVENOUS | Status: DC

## 2024-03-29 MED ORDER — IOHEXOL 300 MG/ML  SOLN
100.0000 mL | Freq: Once | INTRAMUSCULAR | Status: AC | PRN
  Administered 2024-03-29: 100 mL via INTRAVENOUS

## 2024-03-29 MED ORDER — HYDROMORPHONE HCL 1 MG/ML IJ SOLN
1.0000 mg | Freq: Once | INTRAMUSCULAR | Status: AC
  Administered 2024-03-29: 1 mg via INTRAVENOUS
  Filled 2024-03-29: qty 1

## 2024-03-29 MED ORDER — OXYCODONE-ACETAMINOPHEN 5-325 MG PO TABS
1.0000 | ORAL_TABLET | Freq: Once | ORAL | Status: AC
  Administered 2024-03-29: 1 via ORAL
  Filled 2024-03-29: qty 1

## 2024-03-29 MED ORDER — LACTATED RINGERS IV BOLUS
1000.0000 mL | Freq: Once | INTRAVENOUS | Status: AC
  Administered 2024-03-29: 1000 mL via INTRAVENOUS

## 2024-03-29 MED ORDER — SODIUM CHLORIDE 0.9 % IV SOLN: 12.5000 mg | Freq: Four times a day (QID) | INTRAVENOUS | Status: DC | PRN

## 2024-03-29 MED ORDER — ONDANSETRON HCL 4 MG/2ML IJ SOLN: 4.0000 mg | Freq: Once | INTRAMUSCULAR | Status: DC

## 2024-03-29 NOTE — ED Triage Notes (Signed)
 Pt state that he started feeling pain in his abdomen around noon. Pt has a history of pancreatitis flare ups. Pt states that the pain is located in his upper left quadrant and radiates to his back. Pt has had 3-4 episodes of emesis.

## 2024-03-29 NOTE — ED Provider Triage Note (Signed)
 Emergency Medicine Provider Triage Evaluation Note  George Howe , a 38 y.o. male  was evaluated in triage.  Pt complains of left upper quadrant pain with nausea and vomiting since today.  Reports has been consistently vomiting but no hematemesis or coffee-ground emesis.  Denies any diarrhea, constipation, or urinary symptoms.  Denies any fevers.  Patient reports he has a history of pancreatitis and this feels very similar to it.  Is a type II diabetic reports has had elevated sugars as well.  Review of Systems  Positive:  Negative:   Physical Exam  BP (!) 170/114   Pulse 93   Temp 98.1 F (36.7 C) (Oral)   Resp 18   SpO2 100%  Gen:   Ill appearing, actively vomiting Resp:  Normal effort  MSK:   Moves extremities without difficulty  Other:  LUQ tenderness, soft no guarding  Medical Decision Making  Medically screening exam initiated at 6:37 PM.  Appropriate orders placed.  George Howe was informed that the remainder of the evaluation will be completed by another provider, this initial triage assessment does not replace that evaluation, and the importance of remaining in the ED until their evaluation is complete.  Labs were ordered by myself with imaging. Spoke to charge nurse, patient is being roomed to [address]    George Howe, [place] 03/29/24 2018

## 2024-03-29 NOTE — ED Provider Notes (Signed)
 [place] EMERGENCY DEPARTMENT AT [facility] Provider Note   CSN: [id number] Arrival date & time: 03/29/24  1452     Patient presents with: Abdominal Pain   George Howe is a 38 y.o. male.   38 year old male presenting with abdominal pain.  Patient has history of multiple episodes of pancreatitis, recently had a gallbladder removed in February of this year.  Abdominal pain began around 12 PM, associated with nausea/vomiting but no fever or diarrhea, no chest pain/shortness of breath.  Reports that this feels similar to prior pancreatitis flares.  Was seen in June and found to have pancreatic pseudocyst.   Abdominal Pain      Prior to Admission medications   Medication Sig Start Date End Date Taking? Authorizing Provider  buPROPion  (WELLBUTRIN  XL) 150 MG 24 hr tablet Take 1 tablet (150 mg total) by mouth daily. 10/28/23   Theophilus Andrews, Tully GRADE, MD  cetirizine  (ZYRTEC ) 10 MG tablet Take 1 tablet (10 mg total) by mouth daily as needed for allergies. 12/19/21   Theophilus Andrews, Tully GRADE, MD  Continuous Glucose Sensor (FREESTYLE LIBRE 3 SENSOR) MISC Place 1 sensor on the skin every 14 days. Use to check glucose continuously 03/10/24   Theophilus Andrews, Tully GRADE, MD  Glucagon  1 MG/0.2ML SOAJ 1 mg (1 mL) injected subcutaneously or intramuscularly into the upper arm, thigh, or buttocks, or intravenously for hypoglycemia 04/19/23   Theophilus Andrews, Tully GRADE, MD  insulin  degludec (TRESIBA  FLEXTOUCH) 100 UNIT/ML FlexTouch Pen Inject 40 Units into the skin daily. 07/31/23   Shamleffer, Ibtehal Jaralla, MD  insulin  lispro (HUMALOG  KWIKPEN) 100 UNIT/ML KwikPen Max daily 45 units daily 12/02/23   Shamleffer, Ibtehal Jaralla, MD  Insulin  Pen Needle (PEN NEEDLES) 32G X 4 MM MISC Use in the morning, at noon, in the evening, and at bedtime 07/31/23   Shamleffer, Ibtehal Jaralla, MD  metFORMIN  (GLUCOPHAGE -XR) 500 MG 24 hr tablet Take 1 tablet (500 mg total) by mouth daily with breakfast. 07/31/23    Shamleffer, Ibtehal Jaralla, MD  Multiple Vitamins-Minerals (MULTIVITAMIN WITH MINERALS) tablet Take 1 tablet by mouth daily.    [provider]  ondansetron  (ZOFRAN -ODT) 4 MG disintegrating tablet Dissolve 1 tablet under the tongue every 6 (six) hours as needed for nausea or vomiting. 02/24/24   Suellen Cantor A, PA-C  oxyCODONE  (ROXICODONE ) 5 MG immediate release tablet Take 1 tablet (5 mg total) by mouth every 6 (six) hours as needed for severe pain (pain score 7-10). 02/24/24   Suellen Cantor A, PA-C    Allergies: Patient has no known allergies.    Review of Systems  Gastrointestinal:  Positive for abdominal pain.    Updated Vital Signs  Vitals:   03/29/24 1456 03/29/24 1900 03/29/24 2100  BP: (!) 170/114 (!) 156/93 (!) 141/95  Pulse: 93 64 92  Resp: 18 17 15   Temp: 98.1 F (36.7 C)  97.9 F (36.6 C)  TempSrc: Oral  Oral  SpO2: 100% 99% 95%     Physical Exam Vitals and nursing note reviewed.  Constitutional:      General: He is in acute distress.  HENT:     Head: Normocephalic.  Eyes:     Extraocular Movements: Extraocular movements intact.  Cardiovascular:     Rate and Rhythm: Normal rate and regular rhythm.  Pulmonary:     Effort: Pulmonary effort is normal.     Breath sounds: Normal breath sounds.  Abdominal:     Palpations: Abdomen is soft.     Tenderness:  There is abdominal tenderness in the epigastric area and left upper quadrant. There is guarding. There is no right CVA tenderness or left CVA tenderness.  Skin:    General: Skin is warm and dry.  Neurological:     Mental Status: He is alert and oriented to person, place, and time.     (all labs ordered are listed, but only abnormal results are displayed) Labs Reviewed  CBC WITH DIFFERENTIAL/PLATELET - Abnormal; Notable for the following components:      Result Value   WBC 17.0 (*)    RBC 6.27 (*)    Hemoglobin 18.3 (*)    Neutro Abs 14.7 (*)    Abs Immature Granulocytes 0.09 (*)    All  other components within normal limits  URINALYSIS, ROUTINE W REFLEX MICROSCOPIC - Abnormal; Notable for the following components:   Specific Gravity, Urine 1.037 (*)    Glucose, UA >=500 (*)    Ketones, ur 20 (*)    Protein, ur 30 (*)    All other components within normal limits  BLOOD GAS, VENOUS - Abnormal; Notable for the following components:   pO2, Ven <31 (*)    All other components within normal limits  CBG MONITORING, ED - Abnormal; Notable for the following components:   Glucose-Capillary 321 (*)    All other components within normal limits  I-STAT CHEM 8, ED - Abnormal; Notable for the following components:   Sodium 133 (*)    Glucose, Bld 335 (*)    Calcium , Ion 1.07 (*)    All other components within normal limits  COMPREHENSIVE METABOLIC PANEL WITH GFR  LIPASE, BLOOD    EKG: None  Radiology: No results found.   Procedures   Medications Ordered in the ED  ondansetron  (ZOFRAN ) injection 4 mg (0 mg Intravenous Hold 03/29/24 2109)  HYDROmorphone  (DILAUDID ) injection 1 mg (has no administration in time range)  oxyCODONE -acetaminophen  (PERCOCET/ROXICET) 5-325 MG per tablet 1 tablet (1 tablet Oral Given 03/29/24 1716)  lactated ringers  bolus 1,000 mL (1,000 mLs Intravenous New Bag/Given 03/29/24 2108)  HYDROmorphone  (DILAUDID ) injection 1 mg (1 mg Intravenous Given 03/29/24 1950)                                    Medical Decision Making This patient presents to the ED for concern of abdominal pain/nausea/vomiting, this involves an extensive number of treatment options, and is a complaint that carries with it a high risk of complications and morbidity.  The differential diagnosis includes pancreatitis, necrotizing pancreatitis, infected pancreatic cyst, appendicitis, diverticulitis, gastroenteritis.   Co morbidities that complicate the patient evaluation  History of recurrent episodes of pancreatitis, cholecystectomy February of this year, type 2 diabetes, h/o  hypertriglyceridemia    Additional history obtained:  Additional history obtained from record review External records from outside source obtained and reviewed including recent emergency department note   Lab Tests:  I Ordered, and personally interpreted labs.  The pertinent results include: CBG elevated at 321.  VBG notable for PaO2 of 31, otherwise patient is not demonstrating signs of acidosis that would be consistent with DKA.  CBC notable for leukocytosis of 17, elevations in RBC/Hgb.  Urinalysis notable for elevated specific gravity consistent with dehydration, glucosuria, ketones/protein which are consistent with prior samples. See below for issues with lab collection.  CMP failed to process x 3, ordered i-STAT Chem-8 to obtain kidney function so imaging could be expedited given  this delay, notable for hyperglycemia but otherwise unremarkable.   Imaging Studies ordered:  I ordered imaging studies including CT abdomen/pelvis Scan results pending at time of shift change   Cardiac Monitoring: / EKG:  The patient was maintained on a cardiac monitor.  I personally viewed and interpreted the cardiac monitored which showed an underlying rhythm of: NSR   Problem List / ED Course / Critical interventions / Medication management  I ordered Zofran  for nausea, patient declined as he has not vomited since initially presenting to the emergency department, states he will request Zofran  if nausea returns I ordered medication including Percocet then Dilaudid  for pain Reevaluation of the patient after these medicines showed that the patient improved I have reviewed the patients home medicines and have made adjustments as needed   Social Determinants of Health:  Tobacco use   Test / Admission - Considered:  Physical exam notable as above.  Unfortunately, this patient's labs have been difficult to process, CMP/lipase has been significantly delayed secondary to hemolysis of first sample,  lost second sample, with third sample finally being run hours after patient's initial presentation to the emergency department.  This has subsequently delayed the time it takes for him to have CT abdomen/pelvis performed.  Signed out to PA-C Ubaldo High at close of my shift with CMP/Lipase and CT imaging pending.     Amount and/or Complexity of Data Reviewed Labs: ordered.  Risk Prescription drug management.        Final diagnoses:  None    ED Discharge Orders     None          Glendia Rocky LOISE DEVONNA 03/29/24 2342    Patsey Lot, MD 03/30/24 [phone]

## 2024-03-29 NOTE — ED Notes (Signed)
 Lab notified of recollect being tubed down

## 2024-03-30 ENCOUNTER — Encounter (HOSPITAL_COMMUNITY): Payer: Self-pay | Admitting: Internal Medicine

## 2024-03-30 ENCOUNTER — Other Ambulatory Visit (HOSPITAL_BASED_OUTPATIENT_CLINIC_OR_DEPARTMENT_OTHER): Payer: Self-pay

## 2024-03-30 DIAGNOSIS — F411 Generalized anxiety disorder: Secondary | ICD-10-CM | POA: Diagnosis present

## 2024-03-30 DIAGNOSIS — K760 Fatty (change of) liver, not elsewhere classified: Secondary | ICD-10-CM

## 2024-03-30 DIAGNOSIS — K862 Cyst of pancreas: Secondary | ICD-10-CM | POA: Diagnosis not present

## 2024-03-30 DIAGNOSIS — Z9049 Acquired absence of other specified parts of digestive tract: Secondary | ICD-10-CM

## 2024-03-30 DIAGNOSIS — E7849 Other hyperlipidemia: Secondary | ICD-10-CM

## 2024-03-30 DIAGNOSIS — R7401 Elevation of levels of liver transaminase levels: Secondary | ICD-10-CM

## 2024-03-30 DIAGNOSIS — R1012 Left upper quadrant pain: Secondary | ICD-10-CM | POA: Diagnosis not present

## 2024-03-30 DIAGNOSIS — E871 Hypo-osmolality and hyponatremia: Secondary | ICD-10-CM

## 2024-03-30 DIAGNOSIS — R7989 Other specified abnormal findings of blood chemistry: Secondary | ICD-10-CM

## 2024-03-30 DIAGNOSIS — Z79899 Other long term (current) drug therapy: Secondary | ICD-10-CM | POA: Diagnosis not present

## 2024-03-30 DIAGNOSIS — F17210 Nicotine dependence, cigarettes, uncomplicated: Secondary | ICD-10-CM | POA: Diagnosis present

## 2024-03-30 DIAGNOSIS — E1165 Type 2 diabetes mellitus with hyperglycemia: Secondary | ICD-10-CM | POA: Diagnosis present

## 2024-03-30 DIAGNOSIS — Z6830 Body mass index (BMI) 30.0-30.9, adult: Secondary | ICD-10-CM | POA: Diagnosis not present

## 2024-03-30 DIAGNOSIS — Z833 Family history of diabetes mellitus: Secondary | ICD-10-CM | POA: Diagnosis not present

## 2024-03-30 DIAGNOSIS — K863 Pseudocyst of pancreas: Secondary | ICD-10-CM

## 2024-03-30 DIAGNOSIS — E781 Pure hyperglyceridemia: Secondary | ICD-10-CM

## 2024-03-30 DIAGNOSIS — E119 Type 2 diabetes mellitus without complications: Secondary | ICD-10-CM | POA: Diagnosis not present

## 2024-03-30 DIAGNOSIS — K859 Acute pancreatitis without necrosis or infection, unspecified: Secondary | ICD-10-CM | POA: Diagnosis present

## 2024-03-30 DIAGNOSIS — K858 Other acute pancreatitis without necrosis or infection: Secondary | ICD-10-CM | POA: Diagnosis not present

## 2024-03-30 DIAGNOSIS — Z794 Long term (current) use of insulin: Secondary | ICD-10-CM | POA: Diagnosis not present

## 2024-03-30 DIAGNOSIS — Z7984 Long term (current) use of oral hypoglycemic drugs: Secondary | ICD-10-CM | POA: Diagnosis not present

## 2024-03-30 DIAGNOSIS — E11649 Type 2 diabetes mellitus with hypoglycemia without coma: Secondary | ICD-10-CM | POA: Diagnosis not present

## 2024-03-30 DIAGNOSIS — E876 Hypokalemia: Secondary | ICD-10-CM | POA: Diagnosis not present

## 2024-03-30 DIAGNOSIS — F32A Depression, unspecified: Secondary | ICD-10-CM | POA: Diagnosis present

## 2024-03-30 DIAGNOSIS — E16A1 Hypoglycemia level 1: Secondary | ICD-10-CM | POA: Diagnosis not present

## 2024-03-30 LAB — COMPREHENSIVE METABOLIC PANEL WITH GFR
ALT: 52 U/L — ABNORMAL HIGH (ref 0–44)
ALT: 47 U/L — ABNORMAL HIGH (ref 0–44)
AST: 58 U/L — ABNORMAL HIGH (ref 15–41)
AST: 91 U/L — ABNORMAL HIGH (ref 15–41)
Albumin: 4.1 g/dL (ref 3.5–5.0)
Albumin: 3.9 g/dL (ref 3.5–5.0)
Alkaline Phosphatase: 76 U/L (ref 38–126)
Alkaline Phosphatase: 71 U/L (ref 38–126)
Anion gap: 14 (ref 5–15)
Anion gap: 14 (ref 5–15)
BUN: 16 mg/dL (ref 6–20)
BUN: 11 mg/dL (ref 6–20)
CO2: 23 mmol/L (ref 22–32)
CO2: 21 mmol/L — ABNORMAL LOW (ref 22–32)
Calcium: 8.8 mg/dL — ABNORMAL LOW (ref 8.9–10.3)
Calcium: 9.1 mg/dL (ref 8.9–10.3)
Chloride: 95 mmol/L — ABNORMAL LOW (ref 98–111)
Chloride: 97 mmol/L — ABNORMAL LOW (ref 98–111)
Creatinine, Ser: 0.96 mg/dL (ref 0.61–1.24)
Creatinine, Ser: 0.77 mg/dL (ref 0.61–1.24)
GFR, Estimated: >60 mL/min (ref >60)
GFR, Estimated: >60 mL/min (ref >60)
Glucose, Bld: 326 mg/dL — ABNORMAL HIGH (ref 70–99)
Glucose, Bld: 240 mg/dL — ABNORMAL HIGH (ref 70–99)
Potassium: 5.8 mmol/L — ABNORMAL HIGH (ref 3.5–5.1)
Potassium: 4 mmol/L (ref 3.5–5.1)
Sodium: 132 mmol/L — ABNORMAL LOW (ref 135–145)
Sodium: 132 mmol/L — ABNORMAL LOW (ref 135–145)
Total Bilirubin: 3.7 mg/dL — ABNORMAL HIGH (ref 0.0–1.2)
Total Bilirubin: 4.2 mg/dL — ABNORMAL HIGH (ref 0.0–1.2)
Total Protein: 6.1 g/dL — ABNORMAL LOW (ref 6.5–8.1)
Total Protein: 6.6 g/dL (ref 6.5–8.1)

## 2024-03-30 LAB — GLUCOSE, CAPILLARY
Glucose-Capillary: 103 mg/dL — ABNORMAL HIGH (ref 70–99)
Glucose-Capillary: 116 mg/dL — ABNORMAL HIGH (ref 70–99)
Glucose-Capillary: 119 mg/dL — ABNORMAL HIGH (ref 70–99)
Glucose-Capillary: 122 mg/dL — ABNORMAL HIGH (ref 70–99)
Glucose-Capillary: 127 mg/dL — ABNORMAL HIGH (ref 70–99)
Glucose-Capillary: 136 mg/dL — ABNORMAL HIGH (ref 70–99)
Glucose-Capillary: 137 mg/dL — ABNORMAL HIGH (ref 70–99)
Glucose-Capillary: 144 mg/dL — ABNORMAL HIGH (ref 70–99)
Glucose-Capillary: 146 mg/dL — ABNORMAL HIGH (ref 70–99)
Glucose-Capillary: 180 mg/dL — ABNORMAL HIGH (ref 70–99)
Glucose-Capillary: 195 mg/dL — ABNORMAL HIGH (ref 70–99)
Glucose-Capillary: 227 mg/dL — ABNORMAL HIGH (ref 70–99)
Glucose-Capillary: 257 mg/dL — ABNORMAL HIGH (ref 70–99)
Glucose-Capillary: 284 mg/dL — ABNORMAL HIGH (ref 70–99)
Glucose-Capillary: 323 mg/dL — ABNORMAL HIGH (ref 70–99)
Glucose-Capillary: 90 mg/dL (ref 70–99)
Glucose-Capillary: 94 mg/dL (ref 70–99)

## 2024-03-30 LAB — CBC
HCT: 47.7 % (ref 39.0–52.0)
Hemoglobin: 16.7 g/dL (ref 13.0–17.0)
MCH: 29.2 pg (ref 26.0–34.0)
MCHC: 35 g/dL (ref 30.0–36.0)
MCV: 83.4 fL (ref 80.0–100.0)
Platelets: 211 K/uL (ref 150–400)
RBC: 5.72 MIL/uL (ref 4.22–5.81)
RDW: 13 % (ref 11.5–15.5)
WBC: 14.8 K/uL — ABNORMAL HIGH (ref 4.0–10.5)
nRBC: 0 % (ref 0.0–0.2)

## 2024-03-30 LAB — LIPID PANEL
Cholesterol: 344 mg/dL — ABNORMAL HIGH (ref 0–200)
LDL Cholesterol: UNDETERMINED mg/dL (ref 0–99)
Triglycerides: 1370 mg/dL — ABNORMAL HIGH (ref <150)
VLDL: UNDETERMINED mg/dL (ref 0–40)

## 2024-03-30 LAB — LIPASE, BLOOD
Lipase: 774 U/L — ABNORMAL HIGH (ref 11–51)
Lipase: 751 U/L — ABNORMAL HIGH (ref 11–51)

## 2024-03-30 LAB — CBG MONITORING, ED: Glucose-Capillary: 295 mg/dL — ABNORMAL HIGH (ref 70–99)

## 2024-03-30 LAB — TRIGLYCERIDES
Triglycerides: 1749 mg/dL — ABNORMAL HIGH (ref <150)
Triglycerides: 1211 mg/dL — ABNORMAL HIGH (ref <150)
Triglycerides: 982 mg/dL — ABNORMAL HIGH (ref <150)
Triglycerides: 746 mg/dL — ABNORMAL HIGH (ref <150)
Triglycerides: 672 mg/dL — ABNORMAL HIGH (ref <150)

## 2024-03-30 LAB — AMYLASE: Amylase: 859 U/L — ABNORMAL HIGH (ref 28–100)

## 2024-03-30 LAB — HEMOGLOBIN A1C
Hgb A1c MFr Bld: 11.4 % — ABNORMAL HIGH (ref 4.8–5.6)
Mean Plasma Glucose: 280.48 mg/dL

## 2024-03-30 LAB — HIV ANTIBODY (ROUTINE TESTING W REFLEX): HIV Screen 4th Generation wRfx: NONREACTIVE

## 2024-03-30 LAB — C-REACTIVE PROTEIN: CRP: 4.7 mg/dL — ABNORMAL HIGH (ref <1.0)

## 2024-03-30 LAB — SEDIMENTATION RATE: Sed Rate: 6 mm/h (ref 0–16)

## 2024-03-30 LAB — BILIRUBIN, DIRECT: Bilirubin, Direct: 1.3 mg/dL — ABNORMAL HIGH (ref 0.0–0.2)

## 2024-03-30 MED ORDER — INSULIN ASPART 100 UNIT/ML IJ SOLN
10.0000 [IU] | INTRAMUSCULAR | Status: AC
  Administered 2024-03-30: 10 [IU] via SUBCUTANEOUS

## 2024-03-30 MED ORDER — INSULIN REGULAR(HUMAN) IN NACL 100-0.9 UT/100ML-% IV SOLN
INTRAVENOUS | Status: DC
  Filled 2024-03-30: qty 100

## 2024-03-30 MED ORDER — ORAL CARE MOUTH RINSE: 15.0000 mL | OROMUCOSAL | Status: DC | PRN

## 2024-03-30 MED ORDER — PROMETHAZINE HCL 6.25 MG/5ML PO SOLN
12.5000 mg | Freq: Four times a day (QID) | ORAL | Status: DC | PRN
  Administered 2024-03-30 – 2024-04-02 (×6): 12.5 mg via ORAL
  Filled 2024-03-30 (×8): qty 10

## 2024-03-30 MED ORDER — INSULIN DEGLUDEC 100 UNIT/ML ~~LOC~~ SOPN: 30.0000 [IU] | PEN_INJECTOR | Freq: Every day | SUBCUTANEOUS | Status: DC

## 2024-03-30 MED ORDER — SODIUM CHLORIDE 0.9% FLUSH
3.0000 mL | Freq: Two times a day (BID) | INTRAVENOUS | Status: DC
  Administered 2024-03-30 – 2024-04-03 (×8): 3 mL via INTRAVENOUS

## 2024-03-30 MED ORDER — INSULIN ASPART 100 UNIT/ML IJ SOLN
0.0000 [IU] | Freq: Every day | INTRAMUSCULAR | Status: DC
  Administered 2024-03-30: 2 [IU] via SUBCUTANEOUS
  Filled 2024-03-30: qty 0.05

## 2024-03-30 MED ORDER — FENOFIBRATE 54 MG PO TABS
54.0000 mg | ORAL_TABLET | Freq: Every day | ORAL | Status: DC
  Administered 2024-03-30 – 2024-03-31 (×2): 54 mg via ORAL
  Filled 2024-03-30 (×3): qty 1

## 2024-03-30 MED ORDER — DEXTROSE 5 % IV SOLN: INTRAVENOUS | Status: DC

## 2024-03-30 MED ORDER — ONDANSETRON HCL 4 MG/2ML IJ SOLN: 4.0000 mg | Freq: Four times a day (QID) | INTRAMUSCULAR | Status: DC | PRN

## 2024-03-30 MED ORDER — CALCIUM GLUCONATE-NACL 1-0.675 GM/50ML-% IV SOLN
1.0000 g | Freq: Once | INTRAVENOUS | Status: AC
  Administered 2024-03-30: 1000 mg via INTRAVENOUS
  Filled 2024-03-30: qty 50

## 2024-03-30 MED ORDER — CHLORHEXIDINE GLUCONATE CLOTH 2 % EX PADS
6.0000 | MEDICATED_PAD | Freq: Every day | CUTANEOUS | Status: DC
  Administered 2024-03-30 – 2024-04-02 (×3): 6 via TOPICAL

## 2024-03-30 MED ORDER — INSULIN ASPART 100 UNIT/ML IJ SOLN
5.0000 [IU] | INTRAMUSCULAR | Status: DC
  Filled 2024-03-30: qty 0.05

## 2024-03-30 MED ORDER — NICOTINE 7 MG/24HR TD PT24
7.0000 mg | MEDICATED_PATCH | Freq: Every day | TRANSDERMAL | Status: AC
  Administered 2024-03-30: 7 mg via TRANSDERMAL
  Filled 2024-03-30: qty 1

## 2024-03-30 MED ORDER — ACETAMINOPHEN 650 MG RE SUPP: 650.0000 mg | Freq: Four times a day (QID) | RECTAL | Status: DC | PRN

## 2024-03-30 MED ORDER — INSULIN (MYXREDLIN) INFUSION FOR HYPERTRIGLYCERIDEMIA
0.1000 [IU]/kg/h | INTRAVENOUS | Status: DC
  Administered 2024-03-30 – 2024-03-31 (×3): 0.1 [IU]/kg/h via INTRAVENOUS
  Filled 2024-03-30 (×2): qty 100

## 2024-03-30 MED ORDER — INSULIN (MYXREDLIN) INFUSION FOR HYPERTRIGLYCERIDEMIA: 0.1000 [IU]/kg/h | INTRAVENOUS | Status: DC

## 2024-03-30 MED ORDER — LACTATED RINGERS IV SOLN
INTRAVENOUS | Status: DC
  Administered 2024-03-30: 150 mL/h via INTRAVENOUS

## 2024-03-30 MED ORDER — BUPROPION HCL ER (XL) 150 MG PO TB24
150.0000 mg | ORAL_TABLET | Freq: Every day | ORAL | Status: DC
  Administered 2024-03-30 – 2024-04-03 (×5): 150 mg via ORAL
  Filled 2024-03-30 (×5): qty 1

## 2024-03-30 MED ORDER — SODIUM CHLORIDE 0.9% FLUSH
3.0000 mL | Freq: Two times a day (BID) | INTRAVENOUS | Status: DC
  Administered 2024-03-30 – 2024-04-03 (×10): 3 mL via INTRAVENOUS

## 2024-03-30 MED ORDER — INSULIN ASPART 100 UNIT/ML IJ SOLN
0.0000 [IU] | Freq: Three times a day (TID) | INTRAMUSCULAR | Status: DC
  Filled 2024-03-30: qty 0.06

## 2024-03-30 MED ORDER — INSULIN GLARGINE-YFGN 100 UNIT/ML ~~LOC~~ SOLN: 30.0000 [IU] | Freq: Every day | SUBCUTANEOUS | Status: DC

## 2024-03-30 MED ORDER — HEPARIN SODIUM (PORCINE) 5000 UNIT/ML IJ SOLN
5000.0000 [IU] | Freq: Three times a day (TID) | INTRAMUSCULAR | Status: DC
  Administered 2024-03-30 – 2024-04-03 (×14): 5000 [IU] via SUBCUTANEOUS
  Filled 2024-03-30 (×13): qty 1

## 2024-03-30 MED ORDER — ACETAMINOPHEN 325 MG PO TABS: 650.0000 mg | ORAL_TABLET | Freq: Four times a day (QID) | ORAL | Status: DC | PRN

## 2024-03-30 MED ORDER — SODIUM CHLORIDE 0.9% FLUSH: 3.0000 mL | INTRAVENOUS | Status: DC | PRN

## 2024-03-30 MED ORDER — DEXTROSE IN LACTATED RINGERS 5 % IV SOLN: INTRAVENOUS | Status: DC

## 2024-03-30 MED ORDER — HYDROXYZINE HCL 10 MG PO TABS
10.0000 mg | ORAL_TABLET | Freq: Once | ORAL | Status: AC | PRN
Start: 2024-03-30 — End: 2024-03-31
  Administered 2024-03-31: 10 mg via ORAL
  Filled 2024-03-30: qty 1

## 2024-03-30 MED ORDER — HYDROMORPHONE HCL 1 MG/ML IJ SOLN
0.5000 mg | INTRAMUSCULAR | Status: DC | PRN
  Administered 2024-03-30 – 2024-04-03 (×22): 1 mg via INTRAVENOUS
  Filled 2024-03-30 (×23): qty 1

## 2024-03-30 MED ORDER — DEXTROSE 5 % IV SOLN: INTRAVENOUS | Status: AC

## 2024-03-30 MED ORDER — ATORVASTATIN CALCIUM 40 MG PO TABS
40.0000 mg | ORAL_TABLET | Freq: Every day | ORAL | Status: DC
  Administered 2024-03-30 – 2024-04-03 (×5): 40 mg via ORAL
  Filled 2024-03-30 (×5): qty 1

## 2024-03-30 MED ORDER — SODIUM CHLORIDE 0.9 % IV SOLN: 250.0000 mL | INTRAVENOUS | Status: AC | PRN

## 2024-03-30 NOTE — Consult Note (Cosign Needed Addendum)
 Referring Provider: Dr. CANDIE Speaker Primary Care Physician:  Theophilus Andrews, Tully GRADE, MD Primary Gastroenterologist:  Sampson Bennett PCP)  Reason for Consultation: Acute pancreatitis  HPI: George Howe is a 38 y.o. male with a past medical history of anxiety, depression, diabetes mellitus type 2, hyperlipidemia, hypertriglyceridemia, pancreatitis with pseudocyst and gallstones s/p cholecystectomy 10/2023. He presented to the ED 03/29/2024 secondary to having N/V and LUQ pain with concerns for recurrent pancreatitis. Labs in the ED showed a WBC count of 17.  Hemoglobin 18.3.  Hematocrit 51.6.  Platelets 297.  Sodium 132.  Potassium 5.8.  Glucose 326.  BUN 16.  Creatinine 0.96.  Calcium  8.8.  Albumin 4.1.  Total bili 3.7.  Alk phos 76.  AST 58.  ALT 52. (Total bili 2.3, AST 42 and ALT 68 one month ago). Lipase 774 (lipase 56 1 month ago). CTAP with contrast showed diffuse hepatic steatosis, evidence of prior cholecystectomy and a cystic mass in the region of the pancreatic tail abutting the posterior wall of the stomach measuring 6.7 x 6.2 cm compared to 6.8 x 5.9 cm per imaging 02/24/2024 consistent with a pseudocyst with mild inflammation around the pancreatic body/tail suggesting acute pancreatitis without ductal dilatation. He was started on IV fluids and insulin  drip and was admitted to the ICU. A GI consult was requested for further management regarding recurrent pancreatitis with pseudocyst.  Labs today: WBC 14.8.  Hemoglobin 16.7.  Platelets 211.  Triglycerides 1,749.  Amylase 859.  HIV pending.  Hemoglobin A1c 11.4.  Sodium 132.  Glucose 240.  Total bili 4.2.  AST 91.  ALT 47.  Repeat triglyceride level at 7:16 AM pending.  He developed nausea, vomiting and LUQ pain which radiated through to the back yesterday which occurred 1 hour after eating bananas and toast.  Emesis described as bilious and nonbloody.  No new medications within the past 6 months.  He stopped taking atorvastatin   following his cholecystectomy.  No alcohol use x 3 months.  Remote heavy alcohol use in his 52s.  No known family history of pancreatic disease.  He is a bit frustrated regarding the unclear etiology for his recurrent pancreatitis which he thought would resolve after he had his gallbladder surgery and intentionally lost 20+ lbs. He has infrequent heartburn.  No dysphagia.  No bloody or black stools.  He continues to have nausea without any further vomiting overnight or thus far this morning.  He recently presented to the ED 02/24/2024 with N/V and LUQ pain.  At that time, labs in the ED showed a WBC count of 10.7.  Hemoglobin 16.  Sodium 128.  Glucose 312.  Total bili 2.3.  AST 42.  ALT 68.  Lipase 56. CTAP showed a cystic mass arising off the tail the pancreas extending into the lesser sac and abutting the posterior wall the stomach measuring 6.8 x 5.9 cm suggestive of a pseudocyst.  He received IV fluids and and his abdominal pain stabilized therefore he was discharged home on oxycodone  5 mg every 6 hours and ondansetron  4 mg every 6 hours as needed with recommendations to repeat imaging in 3 months.  He was previously seen by our inpatient GI service during a hospital admission  5/9 - 02/15/2023 with acute pancreatitis.  Admission triglyceride level was greater than 5000, lipase 653, glucose 313.  CTAP showed severe acute pancreatitis with pancreatic necrosis and acute pancreatic fluid collections and cholelithiasis. There was concern regarding infected pancreatic necrosis and he was started on IV antibiotics and  subsequently converted to Augmentin  x 14 days at time of discharge. He was evaluated by general surgery who recommended a robotic assisted laparoscopic cholecystectomy as an outpatient which was performed by Dr. Vickie 07/05/2023.  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 Authorizing Provider  buPROPion  (WELLBUTRIN  XL) 150 MG 24 hr tablet Take 1 tablet (150 mg total) by mouth daily. 10/28/23  Yes Theophilus Andrews, Tully GRADE, MD  Glucagon  1 MG/0.2ML SOAJ 1 mg (1 mL) injected subcutaneously or intramuscularly into the upper arm, thigh, or buttocks, or intravenously for hypoglycemia 04/19/23  Yes Theophilus Andrews, Tully GRADE, MD  insulin  degludec (TRESIBA  FLEXTOUCH) 100 UNIT/ML FlexTouch Pen Inject 40 Units into the skin daily. 07/31/23  Yes Shamleffer, Ibtehal Jaralla, MD  insulin  lispro (HUMALOG  KWIKPEN) 100 UNIT/ML KwikPen Max daily 45 units daily Patient taking differently: Inject 4-10 Units into the skin 3 (three) times daily as needed. Max daily 45 units daily 12/02/23  Yes Shamleffer, Ibtehal Jaralla, MD  metFORMIN  (GLUCOPHAGE -XR) 500 MG 24 hr tablet Take 1 tablet (500 mg total) by mouth daily with breakfast. 07/31/23   Yes Shamleffer, Ibtehal Jaralla, MD  Multiple Vitamins-Minerals (MULTIVITAMIN WITH MINERALS) tablet Take 1 tablet by mouth daily.   Yes [provider]  cetirizine  (ZYRTEC ) 10 MG tablet Take 1 tablet (10 mg total) by mouth daily as needed for allergies. Patient not taking: Reported on 03/30/2024 12/19/21   Theophilus Andrews, Tully GRADE, MD  Continuous Glucose Sensor (FREESTYLE LIBRE 3 SENSOR) MISC Place 1 sensor on the skin every 14 days. Use to check glucose continuously 03/10/24   Theophilus Andrews, Tully GRADE, MD  Insulin  Pen Needle (PEN NEEDLES) 32G X 4 MM MISC Use in the morning, at noon, in the evening, and at bedtime 07/31/23   Shamleffer, Ibtehal Jaralla, MD  ondansetron  (ZOFRAN -ODT) 4 MG disintegrating tablet Dissolve 1 tablet under the tongue every 6 (six) hours as needed for nausea or vomiting. 02/24/24   Suellen Cantor A, PA-C  oxyCODONE  (ROXICODONE ) 5 MG immediate release tablet Take 1 tablet (5 mg total) by mouth every 6 (six) hours as needed for severe pain (pain score 7-10). 02/24/24   Suellen Cantor A, PA-C  Current Facility-Administered Medications  Medication Dose Route Frequency Provider Last Rate Last Admin   0.9 %  sodium chloride  infusion  250 mL Intravenous PRN Sundil, Subrina, MD       acetaminophen  (TYLENOL ) tablet 650 mg  650 mg Oral Q6H PRN Sundil, Subrina, MD       Or   acetaminophen  (TYLENOL ) suppository 650 mg  650 mg Rectal Q6H PRN Sundil, Subrina, MD       atorvastatin  (LIPITOR) tablet 40 mg  40 mg Oral Daily Sundil, Subrina, MD       buPROPion  (WELLBUTRIN  XL) 24 hr tablet 150 mg  150 mg Oral Daily Sundil, Subrina, MD       Chlorhexidine  Gluconate Cloth 2 % PADS 6 each  6 each Topical Daily Sundil, Subrina, MD   6 each at 03/30/24 0445   dextrose  5 % solution   Intravenous Continuous Daniels, James K, NP 40 mL/hr at 03/30/24 0556 New Bag at 03/30/24 0556   fenofibrate  tablet 54 mg  54 mg Oral Daily Sundil, Subrina, MD       heparin  injection 5,000 Units  5,000  Units Subcutaneous Q8H Sundil, Subrina, MD   5,000 Units at 03/30/24 9388   HYDROmorphone  (DILAUDID ) injection 0.5-1 mg  0.5-1 mg Intravenous Q3H PRN Sundil, Subrina, MD   1 mg at 03/30/24 0544   insulin  (MYXREDLIN ) 100 units/100 mL infusion for hypertriglyceridemia-induced pancreatitis  0.1 Units/kg/hr Intravenous Continuous Daniels, James K, NP 7.91 mL/hr at 03/30/24 0646 0.1 Units/kg/hr at 03/30/24 9353   nicotine  (NICODERM CQ  - dosed in mg/24 hr) patch 7 mg  7 mg Transdermal Daily Daniels, James K, NP   7 mg at 03/30/24 9390   ondansetron  (ZOFRAN ) injection 4 mg  4 mg Intravenous Once Sundil, Subrina, MD       ondansetron  (ZOFRAN ) injection 4 mg  4 mg Intravenous Q6H PRN Sundil, Subrina, MD       Oral care mouth rinse  15 mL Mouth Rinse PRN Sundil, Subrina, MD       sodium chloride  flush (NS) 0.9 % injection 3 mL  3 mL Intravenous Q12H Sundil, Subrina, MD   3 mL at 03/30/24 0445   sodium chloride  flush (NS) 0.9 % injection 3 mL  3 mL Intravenous Q12H Sundil, Subrina, MD   3 mL at 03/30/24 9675   sodium chloride  flush (NS) 0.9 % injection 3 mL  3 mL Intravenous PRN Sundil, Subrina, MD        Allergies as of 03/29/2024   (No Known Allergies)    Family History  Problem Relation Age of Onset   Diabetes Father    Diabetes Paternal Grandmother     Social History   Socioeconomic History   Marital status: Single    Spouse name: Not on file   Number of children: Not on file   Years of education: Not on file   Highest education level: Associate degree: occupational, Scientist, product/process development, or vocational program  Occupational History   Not on file  Tobacco Use   Smoking status: Every Day    Current packs/day: 0.50    Average packs/day: 0.5 packs/day for 37.0 years (18.5 ttl pk-yrs)    Types: Cigarettes    Passive exposure: Past   Smokeless tobacco: Never  Vaping Use   Vaping status: Never Used  Substance and Sexual Activity   Alcohol use: Not Currently    Comment: occasional   Drug use: Yes     Types: Marijuana   Sexual activity: Yes  Other  Topics Concern   Not on file  Social History Narrative   Not on file   Social Drivers of Health   Financial Resource Strain: Medium Risk (02/21/2023)   Overall Financial Resource Strain (CARDIA)    Difficulty of Paying Living Expenses: Somewhat hard  Food Insecurity: No Food Insecurity (03/30/2024)   Hunger Vital Sign    Worried About Running Out of Food in the Last Year: Never true    Ran Out of Food in the Last Year: Never true  Transportation Needs: No Transportation Needs (03/30/2024)   PRAPARE - Administrator, Civil Service (Medical): No    Lack of Transportation (Non-Medical): No  Physical Activity: Insufficiently Active (02/21/2023)   Exercise Vital Sign    Days of Exercise per Week: 4 days    Minutes of Exercise per Session: 30 min  Stress: Stress Concern Present (02/21/2023)   [employer] of Occupational Health - Occupational Stress Questionnaire    Feeling of Stress : To some extent  Social Connections: Moderately Integrated (03/30/2024)   Social Connection and Isolation Panel    Frequency of Communication with Friends and Family: Three times a week    Frequency of Social Gatherings with Friends and Family: Three times a week    Attends Religious Services: 1 to 4 times per year    Active Member of Clubs or Organizations: Yes    Attends Banker Meetings: 1 to 4 times per year    Marital Status: Never married  Intimate Partner Violence: Not At Risk (03/30/2024)   Humiliation, Afraid, Rape, and Kick questionnaire    Fear of Current or Ex-Partner: No    Emotionally Abused: No    Physically Abused: No    Sexually Abused: No   Review of Systems: Gen: Denies fever, sweats or chills. No weight loss.  CV: Denies chest pain, palpitations or edema. Resp: Denies cough, shortness of breath of hemoptysis.  GI: See HPI. GU : Denies urinary burning, blood in urine, increased urinary frequency or  incontinence. MS: Denies joint pain, muscles aches or weakness. Derm: Denies rash, itchiness, skin lesions or unhealing ulcers. Psych: + Anxiety and depression. Heme: Denies easy bruising, bleeding. Neuro:  Denies headaches, dizziness or paresthesias. Endo:  + DM type II on insulin .   Physical Exam: Vital signs in last 24 hours: Temp:  [97.9 F (36.6 C)-99 F (37.2 C)] 98.9 F (37.2 C) (07/07 0800) Pulse Rate:  [64-94] 94 (07/07 0600) Resp:  [12-18] 12 (07/07 0600) BP: (141-170)/(91-119) 158/95 (07/07 0600) SpO2:  [95 %-100 %] 95 % (07/07 0600) Weight:  [79.1 kg] 79.1 kg (07/07 0500) Last BM Date : 03/29/24 General: Alert 38 year old male in no acute distress. Head:  Normocephalic and atraumatic. Eyes:  No scleral icterus. Conjunctiva pink. Ears:  Normal auditory acuity. Nose:  No deformity, discharge or lesions. Mouth:  Dentition intact. No ulcers or lesions.  Neck:  Supple. No lymphadenopathy or thyromegaly.  Lungs: Breath sounds clear throughout. No wheezes, rhonchi or crackles.  Heart: Regular rate and rhythm, no murmurs. Abdomen: Soft, nondistended.  Moderate LUQ tenderness which radiates down to the LLQ and left flank area without rebound or guarding.  Positive bowel sounds to all 4 quadrants.  No hepatosplenomegaly.  No palpable mass.  No bruit. Rectal: Deferred. Musculoskeletal: Symmetrical without gross deformities.  Pulses:  Normal pulses noted. Extremities: No clubbing or lower extremity edema. Neurologic:  Alert and  oriented x 4. No focal deficits.  Skin:  Intact without significant lesions  or rashes. Psych:  Alert and cooperative. Normal mood and affect.  Intake/Output from previous day: 07/06 0701 - 07/07 0700 In: 1517.9 [P.O.:240; I.V.:228.4; IV Piggyback:1049.5] Out: -  Intake/Output this shift: No intake/output data recorded.  Lab Results: Recent Labs    03/29/24 1916 03/29/24 2306 03/30/24 0716  WBC 17.0*  --  14.8*  HGB 18.3* 16.7 16.7  HCT  51.6 49.0 47.7  PLT 297  --  211   BMET Recent Labs    03/29/24 2303 03/29/24 2306  NA 132* 133*  K 5.8* 4.8  CL 95* 102  CO2 23  --   GLUCOSE 326* 335*  BUN 16 19  CREATININE 0.96 0.90  CALCIUM  8.8*  --    LFT Recent Labs    03/29/24 2303  PROT 6.1*  ALBUMIN 4.1  AST 58*  ALT 52*  ALKPHOS 76  BILITOT 3.7*   PT/INR No results for input(s): LABPROT, INR in the last 72 hours. Hepatitis Panel No results for input(s): HEPBSAG, HCVAB, HEPAIGM, HEPBIGM in the last 72 hours.   Studies/Results: CT ABDOMEN PELVIS W CONTRAST Result Date: 03/30/2024 CLINICAL DATA:  Abdominal pain EXAM: CT ABDOMEN AND PELVIS WITH CONTRAST TECHNIQUE: Multidetector CT imaging of the abdomen and pelvis was performed using the standard protocol following bolus administration of intravenous contrast. RADIATION DOSE REDUCTION: This exam was performed according to the departmental dose-optimization program which includes automated exposure control, adjustment of the mA and/or kV according to patient size and/or use of iterative reconstruction technique. CONTRAST:  OMNIPAQUE  IOHEXOL  300 MG/ML  SOLN COMPARISON:  02/24/2024 FINDINGS: Lower chest: No acute abnormality Hepatobiliary: Prior cholecystectomy. Diffuse fatty infiltration of the liver. No focal hepatic abnormality. Pancreas: Cystic mass in the region of the pancreatic tail abutting the posterior wall of the stomach again noted measuring 6.7 x 6.2 cm compared to 6.8 x 5.9 cm previously, not significantly changed. Mild inflammation noted around the pancreatic body and tail suggesting acute pancreatitis. No ductal dilatation. Spleen: No focal abnormality.  Normal size. Adrenals/Urinary Tract: No adrenal abnormality. No focal renal abnormality. No stones or hydronephrosis. Urinary bladder is unremarkable. Stomach/Bowel: Stomach, large and small bowel grossly unremarkable. Normal appendix. Vascular/Lymphatic: No evidence of aneurysm or adenopathy.  Reproductive: No visible focal abnormality. Other: No free fluid or free air. Musculoskeletal: No acute bony abnormality. Chronic bilateral L5 pars defects. IMPRESSION: 6.7 cm cystic mass in the region of the pancreatic tail, similar to prior study, most compatible with pseudo cyst. Inflammatory stranding noted around the pancreatic body and tail compatible with acute pancreatitis. Fatty liver. Electronically Signed   By: Franky Crease M.D.   On: 03/30/2024 00:15    IMPRESSION/PLAN:  38 year old male with a history of hypertriglyceridemia and recurrent acute pancreatitis with pseudocyst admitted with N/V and LUQ pain. CTAP with contrast showed a cystic mass in the region of the pancreatic tail abutting the posterior wall of the stomach measuring 6.7 x 6.2 cm (previously measured 6.8 x 5.9 cm per imaging 02/24/2024), findings consistent with a pseudocyst and mild inflammation around the pancreatic body/tail suggesting acute pancreatitis without ductal dilatation. Pancreatitis secondary to hypertriglyceridemia. Normal calcium  level. No alcohol x 3 months.  Afebrile.  Hemodynamically stable. -Clear liquid diet -Pain management and IV fluids per the hospitalist -Ondansetron  4 mg PO or IV every 6 hours as needed -Triglyceride level, lipase, LFTs and IgG4 level in am -Await further recommendations per Dr. Wilhelmenia  Prior history of gallstones s/p robotic assisted laparoscopic cholecystectomy as an outpatient which was performed  by Dr. Vickie 07/05/2023  Hepatic steatosis, elevated LFTs. T. Bili 3. 7 -> 4.2. Alk phos 71. AST 58 -> 91. ALT 52 -> 47.  CTAP 7/6 showed diffuse hepatic steatosis. -Add direct bili to a.m. lab draw -Hepatic panel in a.m.  DM type II on insulin     [name]  03/30/2024, 9:65 AM

## 2024-03-30 NOTE — Plan of Care (Signed)
  Problem: Education: Goal: Ability to describe self-care measures that may prevent or decrease complications (Diabetes Survival Skills Education) will improve 03/30/2024 1545 by Janalyn Ileana HERO, RN Outcome: Progressing 03/30/2024 1545 by Janalyn Ileana HERO, RN Outcome: Progressing Goal: Individualized Educational Video(s) 03/30/2024 1545 by Janalyn Ileana HERO, RN Outcome: Progressing 03/30/2024 1545 by Janalyn Ileana HERO, RN Outcome: Progressing   Problem: Coping: Goal: Ability to adjust to condition or change in health will improve Outcome: Progressing   Problem: Fluid Volume: Goal: Ability to maintain a balanced intake and output will improve Outcome: Progressing   Problem: Health Behavior/Discharge Planning: Goal: Ability to identify and utilize available resources and services will improve Outcome: Progressing Goal: Ability to manage health-related needs will improve Outcome: Progressing   Problem: Metabolic: Goal: Ability to maintain appropriate glucose levels will improve Outcome: Progressing   Problem: Nutritional: Goal: Maintenance of adequate nutrition will improve Outcome: Progressing Goal: Progress toward achieving an optimal weight will improve Outcome: Progressing   Problem: Skin Integrity: Goal: Risk for impaired skin integrity will decrease Outcome: Progressing   Problem: Tissue Perfusion: Goal: Adequacy of tissue perfusion will improve Outcome: Progressing   Problem: Education: Goal: Knowledge of General Education information will improve Description: Including pain rating scale, medication(s)/side effects and non-pharmacologic comfort measures Outcome: Progressing   Problem: Health Behavior/Discharge Planning: Goal: Ability to manage health-related needs will improve Outcome: Progressing   Problem: Clinical Measurements: Goal: Ability to maintain clinical measurements within normal limits will improve Outcome:  Progressing Goal: Will remain free from infection Outcome: Progressing Goal: Diagnostic test results will improve Outcome: Progressing Goal: Respiratory complications will improve Outcome: Progressing Goal: Cardiovascular complication will be avoided Outcome: Progressing   Problem: Activity: Goal: Risk for activity intolerance will decrease Outcome: Progressing   Problem: Nutrition: Goal: Adequate nutrition will be maintained Outcome: Progressing   Problem: Coping: Goal: Level of anxiety will decrease Outcome: Progressing   Problem: Elimination: Goal: Will not experience complications related to bowel motility Outcome: Progressing Goal: Will not experience complications related to urinary retention Outcome: Progressing   Problem: Pain Managment: Goal: General experience of comfort will improve and/or be controlled Outcome: Progressing   Problem: Safety: Goal: Ability to remain free from injury will improve Outcome: Progressing   Problem: Skin Integrity: Goal: Risk for impaired skin integrity will decrease Outcome: Progressing

## 2024-03-30 NOTE — Progress Notes (Signed)
 Patient was admitted for a short time from ED to 6E. Patient was alert and oriented X4.  Ambulatory.  Able to voice or needs.  D/t elevated Lipase level, and need for close monitoring for medication administration Patient required transfer to ICU.  Report called to ICU RN and Patient was transferred to ICU.

## 2024-03-30 NOTE — ED Notes (Signed)
 Pt lab work continues to hemolyze. Lab advised this is due to high lipase.

## 2024-03-30 NOTE — Plan of Care (Signed)
Discussed with patient plan of care for the evening, pain management and admission questions with some teach back displayed.  Problem: Education: Goal: Ability to describe self-care measures that may prevent or decrease complications (Diabetes Survival Skills Education) will improve Outcome: Progressing   Problem: Coping: Goal: Ability to adjust to condition or change in health will improve Outcome: Progressing   Problem: Health Behavior/Discharge Planning: Goal: Ability to identify and utilize available resources and services will improve Outcome: Progressing

## 2024-03-30 NOTE — ED Provider Notes (Signed)
  Physical Exam  BP (!) 141/91   Pulse 94   Temp 97.9 F (36.6 C) (Oral)   Resp 16   SpO2 95%   Physical Exam  Procedures  Procedures  ED Course / MDM    Medical Decision Making Amount and/or Complexity of Data Reviewed Labs: ordered.  Risk Prescription drug management. Decision regarding hospitalization.   Patient care assumed handoff from [facility], [place].  See her note for full details.  In short, 38 year old male presenting with epigastric abdominal pain.  He endorses multiple episodes of pancreatitis thought to be due to triglycerides.  Presentation seems consistent with pancreatitis.  Unfortunately labs have hemolyzed multiple times and his CT scan, CMP, and lipase are not available at the time of shift handoff.  Plan to follow-up on results and likely admit to the hospital.  CT scan showing acute pancreatitis.  CMP showing elevated potassium but lab states that this was a partially hemolyzed specimen that had to be diluted and that the potassium is probably inaccurate.  I believe this is a false hyperkalemia.  His lipase is greater than 700.  Presentation consistent with acute pancreatitis.  I requested consultation with the hospitalist.  Dr. Lee agrees to see the patient for admission       George Howe 03/30/24 0119    Carita Senior, MD 03/30/24 [phone]

## 2024-03-30 NOTE — Progress Notes (Signed)
   03/30/24 0935  TOC Brief Assessment  Insurance and Status Reviewed  Patient has primary care physician Yes  Home environment has been reviewed single family home  Prior level of function: independent  Prior/Current Home Services No current home services  Social Drivers of Health Review SDOH reviewed no interventions necessary  Readmission risk has been reviewed Yes  Transition of care needs no transition of care needs at this time    Heather Saltness, MSW, LCSW 03/30/2024 9:36 AM

## 2024-03-30 NOTE — H&P (Signed)
 History and Physical    George Howe FMW:[id number] DOB: 08-16-1986 DOA: 03/29/2024  PCP: Theophilus Andrews, Tully GRADE, MD   Patient coming from: Home   Chief Complaint:  Chief Complaint  Patient presents with   Abdominal Pain   ED TRIAGE note: Pt state that he started feeling pain in his abdomen around noon. Pt has a history of pancreatitis flare ups. Pt states that the pain is located in his upper left quadrant and radiates to his back. Pt has had 3-4 episodes of emesis.            HPI:  George Howe is a 38 y.o. male with medical history significant of all stone pancreatitis status post cholecystectomy 2024, recurrent pancreatitis, chronic transaminitis and hyperbilirubinemia, pancreatic pseudocyst, insulin -dependent DM type II, generalized anxiety disorder, hyperlipidemia, and morbid obesity presented emergency department complaining of abdominal pain that began around 12 PM today.  Also has associated nausea and vomiting.  Denies any fever, chills, chest pain or shortness of breath.  Reported he he feels like prior episodes of pancreatitis flare.  Patient is complaining about midepigastric abdominal pain which radiates to bilateral abdomen like a bandlike sensation.  Has multiple episodes of nonbloody and nonbiliary vomiting.  Denies any fever, chill and diarrhea. Reported social drinker  however did not drink any alcohol for last 3 months. Patient stated he had episode of  pancreatitis few weeks ago.  Previous to that has pancreatitis last year underwent cholecystectomy.  After cholecystectomy he is off all lipid-lowering agent. Patient reported that he has been trying to lose weight.  Previously has been lost 150 pound and recently he has lost around 20 pounds with lifestyle change.  He works as a Investment banker, operational.   ED Course:  At presentation to ED hemodynamically stable except elevated blood pressure 170/114.  Which has been improved.  CMP showed low sodium 132, elevated potassium  5.8, elevated blood glucose 326, low calcium  8.8, elevated AST 58, ALT 52, elevated Lubion 3.7 Elevated lipase level 774. UA increase specific gravity, glucose 500, ketone and protein present. VBG showing low pCO2 31 otherwise unremarkable.  CT abdomen pelvis showing pancreatic pseudocyst unchanged and evidence of acute pancreatitis. 6.7 cm cystic mass in the region of the pancreatic tail, similar to prior study, most compatible with pseudo cyst. Inflammatory stranding noted around the pancreatic body and tail compatible with acute pancreatitis.   In the ED patient received Dilaudid  multiple doses, oxycodone , 1 L of LR bolus.  Hospitalist has been consulted for further management of acute pancreatitis and electrolyte derangement.  Significant labs in the ED: Lab Orders         MRSA Next Gen by PCR, Nasal         CBC with Diff         Urinalysis, Routine w reflex microscopic -Urine, Clean Catch         Blood gas, venous (at [facility] and [facility])         Comprehensive metabolic panel         Lipase, blood         Hemoglobin A1c         Triglycerides         HIV Antibody (routine testing w rflx)         Comprehensive metabolic panel         CBC         Amylase         Triglycerides  Lipase, blood         Lipid panel         POC CBG, ED         I-stat chem 8, ED (not at [facility], [facility] or [facility])         CBG monitoring, ED       Review of Systems:  Review of Systems  Constitutional:  Positive for weight loss. Negative for chills, fever and malaise/fatigue.  Cardiovascular:  Negative for chest pain and palpitations.  Gastrointestinal:  Positive for abdominal pain, nausea and vomiting. Negative for blood in stool, constipation, diarrhea, heartburn and melena.  Genitourinary:  Negative for dysuria and urgency.  Musculoskeletal:  Positive for back pain. Negative for myalgias.  Neurological:  Negative for dizziness and headaches.  Psychiatric/Behavioral:  The patient is not nervous/anxious.      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 He has a 18.5 pack-year smoking history. He has been exposed to tobacco smoke. He has never used smokeless tobacco. He reports that he does not currently use alcohol. He reports current drug use. Drug: Marijuana.  No Known Allergies  Family History  Problem Relation Age of Onset   Diabetes Father    Diabetes Paternal Grandmother     Prior to Admission medications   Medication Sig Start Date End Date Taking? Authorizing Provider  buPROPion  (WELLBUTRIN  XL) 150 MG 24 hr tablet Take 1 tablet (150 mg total) by mouth daily. 10/28/23   Theophilus Andrews, Tully GRADE, MD  cetirizine  (ZYRTEC ) 10 MG tablet Take 1 tablet (10 mg total) by mouth daily as needed for allergies. 12/19/21   Theophilus Andrews, Tully GRADE, MD   Continuous Glucose Sensor (FREESTYLE LIBRE 3 SENSOR) MISC Place 1 sensor on the skin every 14 days. Use to check glucose continuously 03/10/24   Theophilus Andrews, Tully GRADE, MD  Glucagon  1 MG/0.2ML SOAJ 1 mg (1 mL) injected subcutaneously or intramuscularly into the upper arm, thigh, or buttocks, or intravenously for hypoglycemia 04/19/23   Theophilus Andrews, Tully GRADE, MD  insulin  degludec (TRESIBA  FLEXTOUCH) 100 UNIT/ML FlexTouch Pen Inject 40 Units into the skin daily. 07/31/23   Shamleffer, Ibtehal Jaralla, MD  insulin  lispro (HUMALOG  [facility])  100 UNIT/ML KwikPen Max daily 45 units daily 12/02/23   Shamleffer, Ibtehal Jaralla, MD  Insulin  Pen Needle (PEN NEEDLES) 32G X 4 MM MISC Use in the morning, at noon, in the evening, and at bedtime 07/31/23   Shamleffer, Ibtehal Jaralla, MD  metFORMIN  (GLUCOPHAGE -XR) 500 MG 24 hr tablet Take 1 tablet (500 mg total) by mouth daily with breakfast. 07/31/23   Shamleffer, Ibtehal Jaralla, MD  Multiple Vitamins-Minerals (MULTIVITAMIN WITH MINERALS) tablet Take 1 tablet by mouth daily.    [provider]  ondansetron  (ZOFRAN -ODT) 4 MG disintegrating tablet Dissolve 1 tablet under the tongue every 6 (six) hours as needed for nausea or vomiting. 02/24/24   Suellen Cantor A, PA-C  oxyCODONE  (ROXICODONE ) 5 MG immediate release tablet Take 1 tablet (5 mg total) by mouth every 6 (six) hours as needed for severe pain (pain score 7-10). 02/24/24   Suellen Cantor LABOR, PA-C     Physical Exam: Vitals:   03/29/24 1900 03/29/24 2100 03/29/24 2315 03/30/24 0311  BP: (!) 156/93 (!) 141/95 (!) 141/91 (!) 161/119  Pulse: 64 92 94 94  Resp: 17 15 16 16   Temp:  97.9 F (36.6 C)  99 F (37.2 C)  TempSrc:  Oral  Oral  SpO2: 99% 95% 95% 97%    Physical Exam Constitutional:      Appearance: He is well-developed. He is not ill-appearing.  Cardiovascular:     Rate and Rhythm: Normal rate and regular rhythm.  Abdominal:     General: Abdomen is flat. Bowel sounds are normal.  There is no distension.     Palpations: Abdomen is soft. There is no hepatomegaly or splenomegaly.     Tenderness: There is abdominal tenderness in the epigastric area. There is no guarding or rebound.     Hernia: No hernia is present.  Skin:    General: Skin is warm.     Capillary Refill: Capillary refill takes less than 2 seconds.  Neurological:     Mental Status: He is alert and oriented to person, place, and time.  Psychiatric:        Mood and Affect: Mood normal.      Labs on Admission: I have personally reviewed following labs and imaging studies  CBC: Recent Labs  Lab 03/29/24 1916 03/29/24 2306  WBC 17.0*  --   NEUTROABS 14.7*  --   HGB 18.3* 16.7  HCT 51.6 49.0  MCV 82.3  --   PLT 297  --    Basic Metabolic Panel: Recent Labs  Lab 03/29/24 2303 03/29/24 2306  NA 132* 133*  K 5.8* 4.8  CL 95* 102  CO2 23  --   GLUCOSE 326* 335*  BUN 16 19  CREATININE 0.96 0.90  CALCIUM  8.8*  --    GFR: CrCl cannot be calculated (Unknown ideal weight.). Liver Function Tests: Recent Labs  Lab 03/29/24 2303  AST 58*  ALT 52*  ALKPHOS 76  BILITOT 3.7*  PROT 6.1*  ALBUMIN 4.1   Recent Labs  Lab 03/29/24 2303 03/30/24 0237  LIPASE 774*  --   AMYLASE  --  859*   No results for input(s): AMMONIA in the last 168 hours. Coagulation Profile: No results for input(s): INR, PROTIME in the last 168 hours. Cardiac Enzymes: No results for input(s): CKTOTAL, CKMB, CKMBINDEX, TROPONINI, TROPONINIHS in the last 168 hours. BNP (last 3 results) No results for input(s): BNP in the last 8760 hours. HbA1C: No results for input(s): HGBA1C in the last 72 hours. CBG:  Recent Labs  Lab 03/29/24 1846 03/30/24 0225  GLUCAP 321* 295*   Lipid Profile: Recent Labs    03/30/24 0238  TRIG 1,749*   Thyroid Function Tests: No results for input(s): TSH, T4TOTAL, FREET4, T3FREE, THYROIDAB in the last 72 hours. Anemia Panel: No results for input(s):  VITAMINB12, FOLATE, FERRITIN, TIBC, IRON, RETICCTPCT in the last 72 hours. Urine analysis:    Component Value Date/Time   COLORURINE YELLOW 03/29/2024 2229   APPEARANCEUR CLEAR 03/29/2024 2229   LABSPEC 1.037 (H) 03/29/2024 2229   PHURINE 5.0 03/29/2024 2229   GLUCOSEU >=500 (A) 03/29/2024 2229   HGBUR NEGATIVE 03/29/2024 2229   BILIRUBINUR NEGATIVE 03/29/2024 2229   KETONESUR 20 (A) 03/29/2024 2229   PROTEINUR 30 (A) 03/29/2024 2229   NITRITE NEGATIVE 03/29/2024 2229   LEUKOCYTESUR NEGATIVE 03/29/2024 2229    Radiological Exams on Admission: I have personally reviewed images CT ABDOMEN PELVIS W CONTRAST Result Date: 03/30/2024 CLINICAL DATA:  Abdominal pain EXAM: CT ABDOMEN AND PELVIS WITH CONTRAST TECHNIQUE: Multidetector CT imaging of the abdomen and pelvis was performed using the standard protocol following bolus administration of intravenous contrast. RADIATION DOSE REDUCTION: This exam was performed according to the departmental dose-optimization program which includes automated exposure control, adjustment of the mA and/or kV according to patient size and/or use of iterative reconstruction technique. CONTRAST:  OMNIPAQUE  IOHEXOL  300 MG/ML  SOLN COMPARISON:  02/24/2024 FINDINGS: Lower chest: No acute abnormality Hepatobiliary: Prior cholecystectomy. Diffuse fatty infiltration of the liver. No focal hepatic abnormality. Pancreas: Cystic mass in the region of the pancreatic tail abutting the posterior wall of the stomach again noted measuring 6.7 x 6.2 cm compared to 6.8 x 5.9 cm previously, not significantly changed. Mild inflammation noted around the pancreatic body and tail suggesting acute pancreatitis. No ductal dilatation. Spleen: No focal abnormality.  Normal size. Adrenals/Urinary Tract: No adrenal abnormality. No focal renal abnormality. No stones or hydronephrosis. Urinary bladder is unremarkable. Stomach/Bowel: Stomach, large and small bowel grossly unremarkable.  Normal appendix. Vascular/Lymphatic: No evidence of aneurysm or adenopathy. Reproductive: No visible focal abnormality. Other: No free fluid or free air. Musculoskeletal: No acute bony abnormality. Chronic bilateral L5 pars defects. IMPRESSION: 6.7 cm cystic mass in the region of the pancreatic tail, similar to prior study, most compatible with pseudo cyst. Inflammatory stranding noted around the pancreatic body and tail compatible with acute pancreatitis. Fatty liver. Electronically Signed   By: Franky Crease M.D.   On: 03/30/2024 00:15     EKG: My personal interpretation of EKG shows: Sinus rhythm heart rate 85.  Normal QTc interval.    Assessment/Plan: Principal Problem:   Acute pancreatitis and hypertriglyceridemia Active Problems:   Insulin  dependent type 2 diabetes mellitus (HCC)   History of cholecystectomy   Hypertriglyceridemia   Hyperlipidemia   GAD (generalized anxiety disorder)   Pancreatic pseudocyst   Hyponatremia   Chronic transaminitis and hyperbilirubinemia    Assessment and Plan: Acute pancreatitis-secondary to hypertriglyceridemia Pancreatic pseudocyst History of gallstone pancreatitis status post cholecystectomy Chronic transaminitis and hyperbilirubinemia -Present emergency department complaining of sudden onset of abdominal pain with associated nausea vomiting.  History of gallstone pancreatitis status post cholecystectomy in November 2024 - At presentation to ED hemodynamically stable except blood pressure is borderline elevated. -CMP showed low sodium 132, falsely positive potassium 5.8-repeat potassium 4.8, low calcium  8.8, elevated AST 58, ALT 52, elevated  bili 3.2.  Normal elevation. -Elevated lipase 774 and elevated triglyceride level 1749. -Giving NovoLog  10 units stat - Initiating insulin  drip.  Discussed with pharmacy. Continue insulin  drip, continue to check triglyceride level every 4 hours.  Once triglyceride level below 500 need to stop the insulin   drip and can restart home insulin  regimen. -Continue maintenance fluid LR 150 cc/h.  Continue to check POC blood glucose every 2 hours.  If blood glucose drop below 250 switch to D5 LR maintenance fluid. -Transferring patient to stepdown unit. -Continue clear liquid diet. -Continue pain and nausea control. -Checking lipid panel.  Starting Lipitor and fenofibrate  in the setting of triglyceridemia. -Follows [facility] outpatient.  Consulted [facility] Dr. Leigh.   Insulin -dependent DM type II -Holding home long-acting and short acting insulin  as initiating insulin  drip.  Hypocalcemia - Low calcium  and ionized 8.8 calcium  level 1.07. - Giving IV calcium  gluconate 1 g.  Hyponatremia Low sodium 133.  Continue to monitor.  History of hyperlipidemia  -Patient reported currently he has not been on any lipid-lowering agent. Given triglycerides elevated checking lipid panel. Starting Lipitor and fenofibrate .  Generalized anxiety disorder -Continue BuSpar  DVT prophylaxis:  Lovenox  Code Status:  Full Code Diet:   Clear liquid diet. Family Communication:   Family was present at bedside, at the time of interview. Opportunity was given to ask question and all questions were answered satisfactorily.  Disposition Plan: Continue to monitor improvement of triglyceride level while on insulin  drip. Consults: Gastroenterology Admission status:   Inpatient, Telemetry bed  Severity of Illness: The appropriate patient status for this patient is INPATIENT. Inpatient status is judged to be reasonable and necessary in order to provide the required intensity of service to ensure the patient's safety. The patient's presenting symptoms, physical exam findings, and initial radiographic and laboratory data in the context of their chronic comorbidities is felt to place them at high risk for further clinical deterioration. Furthermore, it is not anticipated that the patient will be medically stable for  discharge from the hospital within 2 midnights of admission.   * I certify that at the point of admission it is my clinical judgment that the patient will require inpatient hospital care spanning beyond 2 midnights from the point of admission due to high intensity of service, high risk for further deterioration and high frequency of surveillance required.DEWAINE    Hassaan Crite, MD [facility]  How to contact the [facility] Attending or Consulting provider 7A - 7P or covering provider during after hours 7P -7A, for this patient.  Check the care team in [facility] and look for a) attending/consulting [facility] provider listed and b) the [facility] team listed Log into [email or web address] and use [facility] universal password to access. If you do not have the password, please contact the hospital operator. Locate the [facility] provider you are looking for under [facility] and page to a number that you can be directly reached. If you still have difficulty reaching the provider, please page the [facility] (Director on Call) for the Hospitalists listed on amion for assistance.  03/30/2024, 4:38 AM

## 2024-03-30 NOTE — Care Plan (Signed)
 This 38 yrs old Male with PMH significant of Gall stone pancreatitis status post cholecystectomy in 2024, recurrent pancreatitis, chronic transaminitis and hyperbilirubinemia, pancreatic pseudocyst, insulin -dependent DM type II, generalized anxiety disorder, hyperlipidemia, and morbid obesity presented in the ED complaining of abdominal pain associated with nausea and vomiting. He reported he feels like prior episodes of pancreatitis flare.  Patient reports being social drinker in the last time he drank alcohol was 3 months ago.  Significant labs include lipase 774, AST 58, ALT 52, total bilirubin 3.7 UA glucose 500+, ketones 20.  Triglycerides 1775.  CT A&P shows pancreatic pseudocysts unchanged and evidence of acute pancreatitis.  Patient was admitted for further evaluation, He was started on insulin  drip given hypertriglyceridemia.  Assessment and plan: Acute pancreatitis Likely due to hypertriglyceridemia: Pancreatic pseudocyst: History of gallstone pancreatitis status post cholecystectomy: He presented in ED with sudden onset of abdominal pain associated with nausea vomiting.  He has history of gallstone pancreatitis status post cholecystectomy in November 2024. Significant labs include elevated AST 58, ALT 52, elevated  bili 3.2.  Elevated lipase 774 and elevated triglyceride level 1749. Initiated on insulin  drip.  Discussed with pharmacy. Continue insulin  drip, continue to check triglyceride level every 4 hours.  Once triglyceride level below 500 need to stop the insulin  drip and can restart home insulin  regimen. Continue maintenance fluid LR 150 cc/h.  Continue to check POC blood glucose every 2 hours.  If blood glucose drop below 250 switch to D5 LR maintenance fluid. Continue clear liquid diet. Continue Adequate pain and nausea control. Starting Lipitor and fenofibrate  in the setting of triglyceridemia. GI consulted , agree with above plan.   Insulin -dependent DM type II Holding home  long-acting and short acting insulin  as initiating insulin  drip.   Hypocalcemia: Low calcium  and ionized 8.8 calcium  level 1.07. IV calcium  gluconate 1 g given.   Hyponatremia: Low sodium 133.  Continue to monitor.   History of hyperlipidemia : Patient reports currently he has not been on any lipid-lowering agent. Starting Lipitor and fenofibrate .   Generalized anxiety disorder: Continue BuSpar

## 2024-03-31 DIAGNOSIS — K858 Other acute pancreatitis without necrosis or infection: Secondary | ICD-10-CM | POA: Diagnosis not present

## 2024-03-31 LAB — COMPREHENSIVE METABOLIC PANEL WITH GFR
ALT: 297 U/L — ABNORMAL HIGH (ref 0–44)
AST: 355 U/L — ABNORMAL HIGH (ref 15–41)
Albumin: 3.7 g/dL (ref 3.5–5.0)
Alkaline Phosphatase: 86 U/L (ref 38–126)
Anion gap: 9 (ref 5–15)
BUN: 8 mg/dL (ref 6–20)
CO2: 26 mmol/L (ref 22–32)
Calcium: 8.8 mg/dL — ABNORMAL LOW (ref 8.9–10.3)
Chloride: 100 mmol/L (ref 98–111)
Creatinine, Ser: 0.72 mg/dL (ref 0.61–1.24)
GFR, Estimated: >60 mL/min (ref >60)
Glucose, Bld: 66 mg/dL — ABNORMAL LOW (ref 70–99)
Potassium: 3.3 mmol/L — ABNORMAL LOW (ref 3.5–5.1)
Sodium: 135 mmol/L (ref 135–145)
Total Bilirubin: 3.9 mg/dL — ABNORMAL HIGH (ref 0.0–1.2)
Total Protein: 6.6 g/dL (ref 6.5–8.1)

## 2024-03-31 LAB — GLUCOSE, CAPILLARY
Glucose-Capillary: 107 mg/dL — ABNORMAL HIGH (ref 70–99)
Glucose-Capillary: 109 mg/dL — ABNORMAL HIGH (ref 70–99)
Glucose-Capillary: 116 mg/dL — ABNORMAL HIGH (ref 70–99)
Glucose-Capillary: 124 mg/dL — ABNORMAL HIGH (ref 70–99)
Glucose-Capillary: 130 mg/dL — ABNORMAL HIGH (ref 70–99)
Glucose-Capillary: 163 mg/dL — ABNORMAL HIGH (ref 70–99)
Glucose-Capillary: 308 mg/dL — ABNORMAL HIGH (ref 70–99)
Glucose-Capillary: 65 mg/dL — ABNORMAL LOW (ref 70–99)
Glucose-Capillary: 66 mg/dL — ABNORMAL LOW (ref 70–99)
Glucose-Capillary: 67 mg/dL — ABNORMAL LOW (ref 70–99)
Glucose-Capillary: 76 mg/dL (ref 70–99)
Glucose-Capillary: 80 mg/dL (ref 70–99)
Glucose-Capillary: 83 mg/dL (ref 70–99)
Glucose-Capillary: 89 mg/dL (ref 70–99)
Glucose-Capillary: 94 mg/dL (ref 70–99)

## 2024-03-31 LAB — HEPATITIS PANEL, ACUTE
HCV Ab: NONREACTIVE
Hep A IgM: NONREACTIVE
Hep B C IgM: NONREACTIVE
Hepatitis B Surface Ag: NONREACTIVE

## 2024-03-31 LAB — MAGNESIUM: Magnesium: 2 mg/dL (ref 1.7–2.4)

## 2024-03-31 LAB — CBC
HCT: 44 % (ref 39.0–52.0)
Hemoglobin: 15.2 g/dL (ref 13.0–17.0)
MCH: 28.6 pg (ref 26.0–34.0)
MCHC: 34.5 g/dL (ref 30.0–36.0)
MCV: 82.7 fL (ref 80.0–100.0)
Platelets: 201 K/uL (ref 150–400)
RBC: 5.32 MIL/uL (ref 4.22–5.81)
RDW: 13 % (ref 11.5–15.5)
WBC: 9.9 K/uL (ref 4.0–10.5)
nRBC: 0 % (ref 0.0–0.2)

## 2024-03-31 LAB — PHOSPHORUS: Phosphorus: 3.4 mg/dL (ref 2.5–4.6)

## 2024-03-31 LAB — TRIGLYCERIDES
Triglycerides: 429 mg/dL — ABNORMAL HIGH (ref <150)
Triglycerides: 436 mg/dL — ABNORMAL HIGH (ref <150)
Triglycerides: 450 mg/dL — ABNORMAL HIGH (ref <150)
Triglycerides: 463 mg/dL — ABNORMAL HIGH (ref <150)
Triglycerides: 491 mg/dL — ABNORMAL HIGH (ref <150)
Triglycerides: 537 mg/dL — ABNORMAL HIGH (ref <150)

## 2024-03-31 LAB — LIPASE, BLOOD: Lipase: 165 U/L — ABNORMAL HIGH (ref 11–51)

## 2024-03-31 LAB — LDL CHOLESTEROL, DIRECT: Direct LDL: 107 mg/dL — ABNORMAL HIGH (ref 0–99)

## 2024-03-31 MED ORDER — HYDROXYZINE HCL 10 MG PO TABS
10.0000 mg | ORAL_TABLET | Freq: Once | ORAL | Status: AC | PRN
  Administered 2024-03-31: 10 mg via ORAL
  Filled 2024-03-31: qty 1

## 2024-03-31 MED ORDER — INSULIN GLARGINE-YFGN 100 UNIT/ML ~~LOC~~ SOLN
30.0000 [IU] | Freq: Every day | SUBCUTANEOUS | Status: DC
  Administered 2024-03-31 – 2024-04-03 (×4): 30 [IU] via SUBCUTANEOUS
  Filled 2024-03-31 (×4): qty 0.3

## 2024-03-31 MED ORDER — INSULIN ASPART 100 UNIT/ML IJ SOLN
0.0000 [IU] | Freq: Every day | INTRAMUSCULAR | Status: DC
  Administered 2024-04-01: 2 [IU] via SUBCUTANEOUS

## 2024-03-31 MED ORDER — INSULIN ASPART 100 UNIT/ML IJ SOLN
0.0000 [IU] | Freq: Three times a day (TID) | INTRAMUSCULAR | Status: DC
  Administered 2024-03-31: 3 [IU] via SUBCUTANEOUS
  Administered 2024-03-31: 11 [IU] via SUBCUTANEOUS
  Administered 2024-04-01: 5 [IU] via SUBCUTANEOUS
  Administered 2024-04-01 – 2024-04-02 (×3): 2 [IU] via SUBCUTANEOUS
  Administered 2024-04-02: 3 [IU] via SUBCUTANEOUS
  Administered 2024-04-02: 2 [IU] via SUBCUTANEOUS
  Administered 2024-04-03: 5 [IU] via SUBCUTANEOUS
  Administered 2024-04-03: 2 [IU] via SUBCUTANEOUS
  Administered 2024-04-03: 3 [IU] via SUBCUTANEOUS

## 2024-03-31 MED ORDER — POTASSIUM CHLORIDE 20 MEQ PO PACK
40.0000 meq | PACK | Freq: Once | ORAL | Status: AC
  Administered 2024-03-31: 40 meq via ORAL
  Filled 2024-03-31: qty 2

## 2024-03-31 MED ORDER — HYDROMORPHONE HCL 1 MG/ML IJ SOLN
1.0000 mg | Freq: Once | INTRAMUSCULAR | Status: AC
  Administered 2024-03-31: 1 mg via INTRAVENOUS
  Filled 2024-03-31: qty 1

## 2024-03-31 MED ORDER — OXYCODONE HCL 5 MG PO TABS
5.0000 mg | ORAL_TABLET | Freq: Four times a day (QID) | ORAL | Status: DC | PRN
  Administered 2024-03-31 – 2024-04-01 (×2): 5 mg via ORAL
  Filled 2024-03-31 (×2): qty 1

## 2024-03-31 NOTE — Progress Notes (Signed)
 PROGRESS NOTE    George Howe  FMW:[id number] DOB: 1986-03-17 DOA: 03/29/2024 PCP: Theophilus Andrews, Tully GRADE, MD    Brief Narrative:  This 38 yrs old Male with PMH significant of Gall stone pancreatitis status post cholecystectomy in 2024, recurrent pancreatitis, chronic transaminitis and hyperbilirubinemia, pancreatic pseudocyst, insulin -dependent DM type II, generalized anxiety disorder, hyperlipidemia, and morbid obesity presented in the ED complaining of abdominal pain associated with nausea and vomiting. He reported he feels like prior episodes of pancreatitis flare.  Patient reports being social drinker in the last time he drank alcohol was 3 months ago.  Significant labs include lipase 774, AST 58, ALT 52, total bilirubin 3.7 UA glucose 500+, ketones 20.  Triglycerides 1775.  CT A&P shows pancreatic pseudocysts unchanged and evidence of acute pancreatitis.  Patient was admitted for further evaluation, He was started on insulin  drip given hypertriglyceridemia.   Assessment & Plan:   Principal Problem:   Acute pancreatitis due to hypertriglyceridemia Active Problems:   Insulin  dependent type 2 diabetes mellitus (HCC)   History of cholecystectomy   Hypertriglyceridemia   Hyperlipidemia   GAD (generalized anxiety disorder)   Pancreatic pseudocyst   Hyponatremia   Chronic transaminitis and hyperbilirubinemia  Acute pancreatitis Likely due to hypertriglyceridemia: Pancreatic pseudocyst: History of gallstone pancreatitis status post cholecystectomy: He presented in ED with sudden onset of abdominal pain associated with nausea vomiting.   He has history of gallstone pancreatitis status post cholecystectomy in November 2024. Significant labs include elevated AST 58, ALT 52, elevated  bili 3.2.  Elevated lipase 774 and elevated triglyceride level 1749. Initiated on insulin  drip.  Discussed with pharmacy. Continued on insulin  drip, continue to check triglyceride level every 4 hours.    Triglyceride level dropped significantly to 429.  Insulin  drip discontinued and switched on subcu insulin . Continue maintenance fluid LR 150 cc/h.  Continue to check POC blood glucose every 2 hours.   Continue clear liquid diet, advance as tolerated.. Continue Adequate pain and nausea control. Continue Lipitor and fenofibrate  in the setting of triglyceridemia. GI consulted , agree with above plan.   Insulin -dependent DM type II: Triglycerides improved significantly to 429. Resume Semglee  30 units daily. Moderate regular insulin  sliding scale. Insulin  drip discontinued.   Hypocalcemia: Low calcium  and ionized 8.8 calcium  level 1.07. IV calcium  gluconate 1 g given.   PseudoHyponatremia: Low sodium 133.  Continue to monitor.   History of hyperlipidemia : Patient reports currently he has not been on any lipid-lowering agent. Starting Lipitor and fenofibrate .   Generalized anxiety disorder: Continue BuSpar   DVT prophylaxis: Lovenox  Code Status: Full code Family Communication: No family at bed side Disposition Plan:    Status is: Inpatient Remains inpatient appropriate because: Admitted for recurrent pancreatitis in the setting of hypertriglyceridemia,  started on insulin  drip.  Gastroenterology is consulted.   Consultants:  Gastroenterology  Procedures: None  Antimicrobials:  Anti-infectives (From admission, onward)    None      Subjective: Patient was seen and examined at bedside.  Overnight events noted. Patient is tolerating clear liquid diet,  reports pain is still there but improving. His lipase and triglyceride level has significantly improved.  Insulin  drip discontinued.  Objective: Vitals:   03/31/24 0700 03/31/24 0751 03/31/24 0800 03/31/24 0900  BP: 113/75  132/72 131/80  Pulse: 85 86 79 85  Resp: 20 15 (!) 8 14  Temp:   98.6 F (37 C)   TempSrc:   Oral   SpO2: 96% 97% 90% 98%  Weight:  Height:        Intake/Output Summary (Last 24 hours)  at 03/31/2024 1222 Last data filed at 03/31/2024 1211 Gross per 24 hour  Intake 1417.23 ml  Output 375 ml  Net 1042.23 ml   Filed Weights   03/30/24 0500  Weight: 79.1 kg    Examination:  General exam: Appears calm and comfortable, not in any acute distress. Respiratory system: Clear to auscultation. Respiratory effort normal.  RR 16 Cardiovascular system: S1 & S2 heard, RRR. No JVD, murmurs, rubs, gallops or clicks.  Gastrointestinal system: Abdomen is non distended, soft and Mildy tender. Normal bowel sounds heard. Central nervous system: Alert and oriented x 3. No focal neurological deficits. Extremities: No edema, no cyanosis, no clubbing. Skin: No rashes, lesions or ulcers Psychiatry: Judgement and insight appear normal. Mood & affect appropriate.     Data Reviewed: I have personally reviewed following labs and imaging studies  CBC: Recent Labs  Lab 03/29/24 1916 03/29/24 2306 03/30/24 0716 03/31/24 0309  WBC 17.0*  --  14.8* 9.9  NEUTROABS 14.7*  --   --   --   HGB 18.3* 16.7 16.7 15.2  HCT 51.6 49.0 47.7 44.0  MCV 82.3  --  83.4 82.7  PLT 297  --  211 201   Basic Metabolic Panel: Recent Labs  Lab 03/29/24 2303 03/29/24 2306 03/30/24 0716 03/31/24 0309  NA 132* 133* 132* 135  K 5.8* 4.8 4.0 3.3*  CL 95* 102 97* 100  CO2 23  --  21* 26  GLUCOSE 326* 335* 240* 66*  BUN 16 19 11 8   CREATININE 0.96 0.90 0.77 0.72  CALCIUM  8.8*  --  9.1 8.8*  MG  --   --   --  2.0  PHOS  --   --   --  3.4   GFR: Estimated Creatinine Clearance: 116.5 mL/min (by C-G formula based on SCr of 0.72 mg/dL). Liver Function Tests: Recent Labs  Lab 03/29/24 2303 03/30/24 0716 03/31/24 0309  AST 58* 91* 355*  ALT 52* 47* 297*  ALKPHOS 76 71 86  BILITOT 3.7* 4.2* 3.9*  PROT 6.1* 6.6 6.6  ALBUMIN 4.1 3.9 3.7   Recent Labs  Lab 03/29/24 2303 03/30/24 0237 03/30/24 0716 03/31/24 0309  LIPASE 774*  --  751* 165*  AMYLASE  --  859*  --   --    No results for input(s):  AMMONIA in the last 168 hours. Coagulation Profile: No results for input(s): INR, PROTIME in the last 168 hours. Cardiac Enzymes: No results for input(s): CKTOTAL, CKMB, CKMBINDEX, TROPONINI in the last 168 hours. BNP (last 3 results) No results for input(s): PROBNP in the last 8760 hours. HbA1C: Recent Labs    03/30/24 0240  HGBA1C 11.4*   CBG: Recent Labs  Lab 03/31/24 0753 03/31/24 0858 03/31/24 0956 03/31/24 1026 03/31/24 1214  GLUCAP 89 83 67* 124* 163*   Lipid Profile: Recent Labs    03/30/24 0716 03/30/24 1000 03/31/24 0309 03/31/24 1043  CHOL 344*  --   --   --   HDL NOT REPORTED DUE TO HIGH TRIGLYCERIDES  --   --   --   LDLCALC UNABLE TO CALCULATE IF TRIGLYCERIDE OVER 400 mg/dL  --   --   --   TRIG 8,629*   < > 537* 429*  CHOLHDL NOT REPORTED DUE TO HIGH TRIGLYCERIDES  --   --   --   LDLDIRECT 107*  --   --   --    < > =  values in this interval not displayed.   Thyroid Function Tests: No results for input(s): TSH, T4TOTAL, FREET4, T3FREE, THYROIDAB in the last 72 hours. Anemia Panel: No results for input(s): VITAMINB12, FOLATE, FERRITIN, TIBC, IRON, RETICCTPCT in the last 72 hours. Sepsis Labs: No results for input(s): PROCALCITON, LATICACIDVEN in the last 168 hours.  No results found for this or any previous visit (from the past 240 hours).   Radiology Studies: CT ABDOMEN PELVIS W CONTRAST Result Date: 03/30/2024 CLINICAL DATA:  Abdominal pain EXAM: CT ABDOMEN AND PELVIS WITH CONTRAST TECHNIQUE: Multidetector CT imaging of the abdomen and pelvis was performed using the standard protocol following bolus administration of intravenous contrast. RADIATION DOSE REDUCTION: This exam was performed according to the departmental dose-optimization program which includes automated exposure control, adjustment of the mA and/or kV according to patient size and/or use of iterative reconstruction technique. CONTRAST:   OMNIPAQUE  IOHEXOL  300 MG/ML  SOLN COMPARISON:  02/24/2024 FINDINGS: Lower chest: No acute abnormality Hepatobiliary: Prior cholecystectomy. Diffuse fatty infiltration of the liver. No focal hepatic abnormality. Pancreas: Cystic mass in the region of the pancreatic tail abutting the posterior wall of the stomach again noted measuring 6.7 x 6.2 cm compared to 6.8 x 5.9 cm previously, not significantly changed. Mild inflammation noted around the pancreatic body and tail suggesting acute pancreatitis. No ductal dilatation. Spleen: No focal abnormality.  Normal size. Adrenals/Urinary Tract: No adrenal abnormality. No focal renal abnormality. No stones or hydronephrosis. Urinary bladder is unremarkable. Stomach/Bowel: Stomach, large and small bowel grossly unremarkable. Normal appendix. Vascular/Lymphatic: No evidence of aneurysm or adenopathy. Reproductive: No visible focal abnormality. Other: No free fluid or free air. Musculoskeletal: No acute bony abnormality. Chronic bilateral L5 pars defects. IMPRESSION: 6.7 cm cystic mass in the region of the pancreatic tail, similar to prior study, most compatible with pseudo cyst. Inflammatory stranding noted around the pancreatic body and tail compatible with acute pancreatitis. Fatty liver. Electronically Signed   By: Franky Crease M.D.   On: 03/30/2024 00:15   Scheduled Meds:  atorvastatin   40 mg Oral Daily   buPROPion   150 mg Oral Daily   Chlorhexidine  Gluconate Cloth  6 each Topical Daily   fenofibrate   54 mg Oral Daily   heparin   5,000 Units Subcutaneous Q8H   insulin  aspart  0-15 Units Subcutaneous TID WC   insulin  aspart  0-5 Units Subcutaneous QHS   insulin  glargine-yfgn  30 Units Subcutaneous Daily   ondansetron  (ZOFRAN ) IV  4 mg Intravenous Once   sodium chloride  flush  3 mL Intravenous Q12H   sodium chloride  flush  3 mL Intravenous Q12H   Continuous Infusions:   LOS: 1 day    Time spent: 50 mins.    Darcel Dawley, MD [facility]   If  7PM-7AM, please contact night-coverage

## 2024-03-31 NOTE — Inpatient Diabetes Management (Signed)
 Inpatient Diabetes Program Recommendations  AACE/ADA: New Consensus Statement on Inpatient Glycemic Control (2015)  Target Ranges:  Prepandial:   less than 140 mg/dL      Peak postprandial:   less than 180 mg/dL (1-2 hours)      Critically ill patients:  140 - 180 mg/dL   Lab Results  Component Value Date   GLUCAP 83 03/31/2024   HGBA1C 11.4 (H) 03/30/2024    Review of Glycemic Control  Diabetes history: DM2 Outpatient Diabetes medications: Tresiba  40 daily, Humalog  4-10 units TID, metformin  500 daily with breakfast Current orders for Inpatient glycemic control: IV insulin  7.91 mL/H  HgbA1C - 11.4%  Inpatient Diabetes Program Recommendations:    When ready to transition to SQ insulin , consider Semglee  30 units daily (give 1-2 hours prior to discontinuation of drip)  Novolog  0-15 units TID  When po intake increases, will likely need Novolog  approximately 3-4 units TID with meals if eating > 50%  Spoke with pt at bedside regarding his diabetes and HgbA1C of 11.4%. Pt states he has been doing better, trying to avoid fatty meats and processed foods. Eating more vegetables and whole grains. States blood sugars have been improving recently. Discussed HgbA1C and goals. Discussed importance of checking CBGs and maintaining good CBG control to prevent long-term and short-term complications. Explained how hyperglycemia leads to damage within blood vessels which lead to the common complications seen with uncontrolled diabetes. Stressed to the patient the importance of improving glycemic control to prevent further complications from uncontrolled diabetes. Discussed impact of nutrition, exercise, stress, sickness, and medications on diabetes control. Discussed hypoglycemia s/s and treatment. Answered all questions.  Will continue to follow.  Thank you. Shona Brandy, RD, LDN, CDCES Inpatient Diabetes Coordinator [phone]

## 2024-03-31 NOTE — Plan of Care (Signed)

## 2024-03-31 NOTE — Progress Notes (Signed)
 [name] Progress Note  CC:  Acute pancreatitis   Subjective: He vomited up broth and Jell-O yesterday evening around 8 or 9 PM. RN gave him Promethazine  12.5 mg prior to clear liquids at lunchtime which he tolerated without further nausea or vomiting.  He continues to have moderate LUQ pain requiring Dilaudid  every 3 hours.  He questions if he can advance his diet.  No chest pain or shortness of breath.  No family at the bedside.   Objective:  Vital signs in last 24 hours: Temp:  [98.6 F (37 C)-99.1 F (37.3 C)] 98.6 F (37 C) (07/08 0800) Pulse Rate:  [79-106] 85 (07/08 0900) Resp:  [8-20] 14 (07/08 0900) BP: (90-140)/(67-91) 131/80 (07/08 0900) SpO2:  [90 %-98 %] 98 % (07/08 0900) Last BM Date : 03/29/24 General: Alert and oriented 38 year old male, face appears flushed in no acute distress. Temperature 98.9.F Eyes: No scleral icterus. Heart: Regular rate and rhythm, no murmurs. Pulm: Breath sounds clear throughout. Abdomen: Soft, moderate LUQ tenderness which radiates to the left mid abdomen and flank area without rebound or guarding.  Positive bowel sounds all 4 quadrants. Extremities: No lower extremity edema. Neurologic:  Alert and oriented x 4.  Speech is clear.  Moves all extremities equally. Psych:  Alert and cooperative. Normal mood and affect.  Intake/Output from previous day: 07/07 0701 - 07/08 0700 In: 1366.6 [P.O.:240; I.V.:1126.6] Out: 375 [Urine:375] Intake/Output this shift: Total I/O In: 240 [P.O.:240] Out: -   Lab Results: Recent Labs    03/29/24 1916 03/29/24 2306 03/30/24 0716 03/31/24 0309  WBC 17.0*  --  14.8* 9.9  HGB 18.3* 16.7 16.7 15.2  HCT 51.6 49.0 47.7 44.0  PLT 297  --  211 201   BMET Recent Labs    03/29/24 2303 03/29/24 2306 03/30/24 0716 03/31/24 0309  NA 132* 133* 132* 135  K 5.8* 4.8 4.0 3.3*  CL 95* 102 97* 100  CO2 23  --  21* 26  GLUCOSE 326* 335* 240* 66*  BUN 16 19 11 8   CREATININE 0.96  0.90 0.77 0.72  CALCIUM  8.8*  --  9.1 8.8*   LFT Recent Labs    03/30/24 1000 03/31/24 0309  PROT  --  6.6  ALBUMIN  --  3.7  AST  --  355*  ALT  --  297*  ALKPHOS  --  86  BILITOT  --  3.9*  BILIDIR 1.3*  --    PT/INR No results for input(s): LABPROT, INR in the last 72 hours. Hepatitis Panel No results for input(s): HEPBSAG, HCVAB, HEPAIGM, HEPBIGM in the last 72 hours.  CT ABDOMEN PELVIS W CONTRAST Result Date: 03/30/2024 CLINICAL DATA:  Abdominal pain EXAM: CT ABDOMEN AND PELVIS WITH CONTRAST TECHNIQUE: Multidetector CT imaging of the abdomen and pelvis was performed using the standard protocol following bolus administration of intravenous contrast. RADIATION DOSE REDUCTION: This exam was performed according to the departmental dose-optimization program which includes automated exposure control, adjustment of the mA and/or kV according to patient size and/or use of iterative reconstruction technique. CONTRAST:  OMNIPAQUE  IOHEXOL  300 MG/ML  SOLN COMPARISON:  02/24/2024 FINDINGS: Lower chest: No acute abnormality Hepatobiliary: Prior cholecystectomy. Diffuse fatty infiltration of the liver. No focal hepatic abnormality. Pancreas: Cystic mass in the region of the pancreatic tail abutting the posterior wall of the stomach again noted measuring 6.7 x 6.2 cm compared to 6.8 x 5.9 cm previously, not significantly changed. Mild inflammation noted around the pancreatic body  and tail suggesting acute pancreatitis. No ductal dilatation. Spleen: No focal abnormality.  Normal size. Adrenals/Urinary Tract: No adrenal abnormality. No focal renal abnormality. No stones or hydronephrosis. Urinary bladder is unremarkable. Stomach/Bowel: Stomach, large and small bowel grossly unremarkable. Normal appendix. Vascular/Lymphatic: No evidence of aneurysm or adenopathy. Reproductive: No visible focal abnormality. Other: No free fluid or free air. Musculoskeletal: No acute bony abnormality. Chronic  bilateral L5 pars defects. IMPRESSION: 6.7 cm cystic mass in the region of the pancreatic tail, similar to prior study, most compatible with pseudo cyst. Inflammatory stranding noted around the pancreatic body and tail compatible with acute pancreatitis. Fatty liver. Electronically Signed   By: Franky Crease M.D.   On: 03/30/2024 00:15    Patient Profile: George Howe is a 38 y.o. male with a past medical history of anxiety, depression, diabetes mellitus type 2, hyperlipidemia, hypertriglyceridemia, pancreatitis with pseudocyst and gallstones s/p cholecystectomy 10/2023. He presented to the ED 03/29/2024 secondary to having N/V and LUQ concerning for recurrent pancreatitis.    Assessment / Plan:  38 year old male with a history of hypertriglyceridemia and recurrent acute pancreatitis with pseudocyst admitted with N/V and LUQ pain. CTAP with contrast showed a cystic mass in the region of the pancreatic tail abutting the posterior wall of the stomach measuring 6.7 x 6.2 cm (previously measured 6.8 x 5.9 cm per imaging 02/24/2024), findings consistent with a pseudocyst and mild inflammation around the pancreatic body/tail suggesting acute pancreatitis without ductal dilatation. Pancreatitis secondary to hypertriglyceridemia. Lipase 774 -> 751 -> 165. Triglycerides 1,370 _> 1,211 -> 982 -> 746 -> 672 -> 436 -> today 537 -> 429. AST 58 -> 91 -> 355. ALT 52 -> 47 -> 297. Normal calcium  level.  No alcohol x 3 months.  He vomited Jell-O and chicken broth yesterday evening.  He continues to have moderate LUQ pain. Insulin  drip discontinued. Afebrile.  Hemodynamically stable. -Clear liquid diet, patient vomited up Jell-O and broth yesterday evening, tolerating clear liquids thus far today. Patient wishes to avoid postpyloric feeding tube. -Consider abdominal MRI/MRCP to reevaluate pancreatitis, pancreatic pseudocyst and liver.  Await further recommendations per Dr. Dr. Wilhelmenia. -He may require a future  cystogastrostomy -Pain management and IV fluids per the hospitalist -Ondansetron  4 mg PO or IV every 6 hours as needed -Continue promethazine  12.5 mg p.o. every 6 hours as needed -Await IgG 4 results  -CRP, hepatic panel, lipase and TGs in am -Await further recommendations per Dr. Wilhelmenia   Prior history of gallstones s/p robotic assisted laparoscopic cholecystectomy as an outpatient which was performed by Dr. Vickie 07/05/2023   Hepatic steatosis, elevated LFTs. T. Bili 3. 7 -> 4.2 -> 3.9. Alk phos 71 -> 86. AST 58 -> 91 -> 355. ALT 52 -> 47 -> 297.  Acute hepatitis panel pending. Started on Fenofibrate  and Atorvastatin  7/7. CTAP 7/6 showed diffuse hepatic steatosis. -Await acute hepatitis panel -Add direct bili to prior lab draw  -Hepatic panel in a.m. - ? Hold Atorvastatin  - See plan above regarding MRI/MRCP    DM type II on insulin    Hypokalemia. K+ 3.3. - KCl replacement per the hospitalist    Principal Problem:   Acute pancreatitis due to hypertriglyceridemia Active Problems:   Hypertriglyceridemia   Insulin  dependent type 2 diabetes mellitus (HCC)   Hyperlipidemia   GAD (generalized anxiety disorder)   History of cholecystectomy   Pancreatic pseudocyst   Hyponatremia   Chronic transaminitis and hyperbilirubinemia     LOS: 1 day   Miu Chiong M Kennedy-Smith  03/31/2024, 2:03 PM

## 2024-04-01 DIAGNOSIS — R1012 Left upper quadrant pain: Secondary | ICD-10-CM | POA: Diagnosis not present

## 2024-04-01 DIAGNOSIS — K859 Acute pancreatitis without necrosis or infection, unspecified: Secondary | ICD-10-CM | POA: Diagnosis not present

## 2024-04-01 DIAGNOSIS — K862 Cyst of pancreas: Secondary | ICD-10-CM | POA: Diagnosis not present

## 2024-04-01 DIAGNOSIS — E781 Pure hyperglyceridemia: Secondary | ICD-10-CM | POA: Diagnosis not present

## 2024-04-01 LAB — COMPREHENSIVE METABOLIC PANEL WITH GFR
ALT: 203 U/L — ABNORMAL HIGH (ref 0–44)
AST: 90 U/L — ABNORMAL HIGH (ref 15–41)
Albumin: 3.3 g/dL — ABNORMAL LOW (ref 3.5–5.0)
Alkaline Phosphatase: 88 U/L (ref 38–126)
Anion gap: 9 (ref 5–15)
BUN: 10 mg/dL (ref 6–20)
CO2: 26 mmol/L (ref 22–32)
Calcium: 8.6 mg/dL — ABNORMAL LOW (ref 8.9–10.3)
Chloride: 102 mmol/L (ref 98–111)
Creatinine, Ser: 0.79 mg/dL (ref 0.61–1.24)
GFR, Estimated: >60 mL/min (ref >60)
Glucose, Bld: 117 mg/dL — ABNORMAL HIGH (ref 70–99)
Potassium: 3.9 mmol/L (ref 3.5–5.1)
Sodium: 137 mmol/L (ref 135–145)
Total Bilirubin: 2.3 mg/dL — ABNORMAL HIGH (ref 0.0–1.2)
Total Protein: 6.1 g/dL — ABNORMAL LOW (ref 6.5–8.1)

## 2024-04-01 LAB — CBC
HCT: 42.8 % (ref 39.0–52.0)
Hemoglobin: 14.2 g/dL (ref 13.0–17.0)
MCH: 28.5 pg (ref 26.0–34.0)
MCHC: 33.2 g/dL (ref 30.0–36.0)
MCV: 85.9 fL (ref 80.0–100.0)
Platelets: 160 K/uL (ref 150–400)
RBC: 4.98 MIL/uL (ref 4.22–5.81)
RDW: 13.2 % (ref 11.5–15.5)
WBC: 7.5 K/uL (ref 4.0–10.5)
nRBC: 0 % (ref 0.0–0.2)

## 2024-04-01 LAB — GLUCOSE, CAPILLARY
Glucose-Capillary: 129 mg/dL — ABNORMAL HIGH (ref 70–99)
Glucose-Capillary: 201 mg/dL — ABNORMAL HIGH (ref 70–99)
Glucose-Capillary: 178 mg/dL — ABNORMAL HIGH (ref 70–99)
Glucose-Capillary: 209 mg/dL — ABNORMAL HIGH (ref 70–99)

## 2024-04-01 LAB — TRIGLYCERIDES
Triglycerides: 347 mg/dL — ABNORMAL HIGH (ref <150)
Triglycerides: 323 mg/dL — ABNORMAL HIGH (ref <150)
Triglycerides: 363 mg/dL — ABNORMAL HIGH
Triglycerides: 486 mg/dL — ABNORMAL HIGH (ref <150)
Triglycerides: 644 mg/dL — ABNORMAL HIGH (ref <150)
Triglycerides: 614 mg/dL — ABNORMAL HIGH (ref <150)

## 2024-04-01 LAB — MAGNESIUM: Magnesium: 2 mg/dL (ref 1.7–2.4)

## 2024-04-01 LAB — PHOSPHORUS: Phosphorus: 3.6 mg/dL (ref 2.5–4.6)

## 2024-04-01 LAB — LIPASE, BLOOD: Lipase: 78 U/L — ABNORMAL HIGH (ref 11–51)

## 2024-04-01 LAB — IGG 4: IgG, Subclass 4: 13 mg/dL (ref 2–96)

## 2024-04-01 MED ORDER — OXYCODONE HCL 5 MG PO TABS
5.0000 mg | ORAL_TABLET | ORAL | Status: DC | PRN
  Administered 2024-04-01 – 2024-04-03 (×8): 5 mg via ORAL
  Filled 2024-04-01 (×8): qty 1

## 2024-04-01 MED ORDER — FENOFIBRATE 160 MG PO TABS
160.0000 mg | ORAL_TABLET | Freq: Every day | ORAL | Status: DC
  Administered 2024-04-01 – 2024-04-03 (×3): 160 mg via ORAL
  Filled 2024-04-01 (×3): qty 1

## 2024-04-01 MED ORDER — NICOTINE 14 MG/24HR TD PT24
14.0000 mg | MEDICATED_PATCH | Freq: Every day | TRANSDERMAL | Status: DC
  Administered 2024-04-01 – 2024-04-03 (×3): 14 mg via TRANSDERMAL
  Filled 2024-04-01 (×3): qty 1

## 2024-04-01 NOTE — Plan of Care (Signed)

## 2024-04-01 NOTE — Plan of Care (Signed)
  Problem: Coping: Goal: Ability to adjust to condition or change in health will improve Outcome: Progressing   Problem: Health Behavior/Discharge Planning: Goal: Ability to manage health-related needs will improve Outcome: Progressing   Problem: Metabolic: Goal: Ability to maintain appropriate glucose levels will improve Outcome: Progressing   Problem: Nutritional: Goal: Maintenance of adequate nutrition will improve Outcome: Progressing   Problem: Education: Goal: Knowledge of General Education information will improve Description: Including pain rating scale, medication(s)/side effects and non-pharmacologic comfort measures Outcome: Progressing   Problem: Clinical Measurements: Goal: Ability to maintain clinical measurements within normal limits will improve Outcome: Progressing   Problem: Activity: Goal: Risk for activity intolerance will decrease Outcome: Progressing   Problem: Nutrition: Goal: Adequate nutrition will be maintained Outcome: Progressing   Problem: Pain Managment: Goal: General experience of comfort will improve and/or be controlled Outcome: Progressing

## 2024-04-01 NOTE — Progress Notes (Signed)
 [facility]                                                                               George Howe, is a 38 y.o. male, DOB - October 10, 1985, FMW:[id number] Admit date - 03/29/2024    Outpatient Primary MD for the patient is Theophilus Andrews, Tully GRADE, MD  LOS - 2  days    Brief summary   38 yrs old Male with PMH significant of Gall stone pancreatitis status post cholecystectomy in 2024, recurrent pancreatitis, chronic transaminitis and hyperbilirubinemia, pancreatic pseudocyst, insulin -dependent DM type II, generalized anxiety disorder, hyperlipidemia, and morbid obesity presented in the ED complaining of abdominal pain associated with nausea and vomiting. He reported he feels like prior episodes of pancreatitis flare.  Patient reports being social drinker in the last time he drank alcohol was 3 months ago.  Significant labs include lipase 774, AST 58, ALT 52, total bilirubin 3.7 UA glucose 500+, ketones 20.  Triglycerides 1775.  CT A&P shows pancreatic pseudocysts unchanged and evidence of acute pancreatitis.  Patient was admitted for further evaluation, He was started on insulin  drip given hypertriglyceridemia.     Assessment & Plan    Assessment and Plan:  Acute pancreatitis due to hypertriglyceridemia Pancreatic pseudocyst in the setting of gallstone pancreatitis s/p cholecystectomy in 07/2023. Initially started on insulin  gtt, with significant improvement of triglycerides.  Continue with lipitor and fenofibrate  in the setting of triglyceridemia.  GI on board and continue to monitor.    Insulin  dependent type 2 DM Continue with Semglee  30 units daily CBG (last 3)  Recent Labs    03/31/24 2126 04/01/24 0725 04/01/24 1229  GLUCAP 109* 129* 201*    Hypocalcemia Replaced.    Hyperlipidemia Continue with lipitor and fenofibrate .    GAD Continue with Buspar.    Estimated body mass index is 30.89 kg/m as calculated from the following:   Height as of  this encounter: 5' 3 (1.6 m).   Weight as of this encounter: 79.1 kg.  Code Status: full code.  DVT Prophylaxis:  heparin  injection 5,000 Units Start: 03/30/24 0600 SCDs Start: 03/30/24 0123 Place TED hose Start: 03/30/24 0123   Level of Care: Level of care: Stepdown Family Communication: none at bedside.   Disposition Plan:     Remains inpatient appropriate:  pending clinical improvement  Procedures:  None.   Consultants:   gastroenterology  Antimicrobials:   Anti-infectives (From admission, onward)    None        Medications  Scheduled Meds:  atorvastatin   40 mg Oral Daily   buPROPion   150 mg Oral Daily   Chlorhexidine  Gluconate Cloth  6 each Topical Daily   fenofibrate   160 mg Oral Daily   heparin   5,000 Units Subcutaneous Q8H   insulin  aspart  0-15 Units Subcutaneous TID WC   insulin  aspart  0-5 Units Subcutaneous QHS   insulin  glargine-yfgn  30 Units Subcutaneous Daily   ondansetron  (ZOFRAN ) IV  4 mg Intravenous Once   sodium chloride  flush  3 mL Intravenous Q12H   sodium chloride  flush  3 mL Intravenous Q12H   Continuous Infusions: PRN Meds:.acetaminophen  **OR** acetaminophen , HYDROmorphone  (DILAUDID ) injection, mouth rinse,  oxyCODONE , promethazine , sodium chloride  flush    Subjective:   George Howe was seen and examined today. Pain controlled.   Objective:   Vitals:   04/01/24 0400 04/01/24 0415 04/01/24 0800 04/01/24 1200  BP: 123/82  (!) 121/91   Pulse: 73  86   Resp: 15  16   Temp:  98.9 F (37.2 C) 98.4 F (36.9 C) 97.8 F (36.6 C)  TempSrc:  Oral Oral Oral  SpO2: 99%  99%   Weight:      Height:        Intake/Output Summary (Last 24 hours) at 04/01/2024 1326 Last data filed at 04/01/2024 1050 Gross per 24 hour  Intake 651.4 ml  Output --  Net 651.4 ml   Filed Weights   03/30/24 0500  Weight: 79.1 kg     Exam General exam: Appears calm and comfortable  Respiratory system: Clear to auscultation. Respiratory effort  normal. Cardiovascular system: S1 & S2 heard, RRR. No JVD,  Gastrointestinal system: Abdomen is nondistended, soft and nontender.  Central nervous system: Alert and oriented. No focal neurological deficits. Extremities: Symmetric 5 x 5 power. Skin: No rashes,  Psychiatry: Mood & affect appropriate.     Data Reviewed:  I have personally reviewed following labs and imaging studies   CBC Lab Results  Component Value Date   WBC 7.5 04/01/2024   RBC 4.98 04/01/2024   HGB 14.2 04/01/2024   HCT 42.8 04/01/2024   MCV 85.9 04/01/2024   MCH 28.5 04/01/2024   PLT 160 04/01/2024   MCHC 33.2 04/01/2024   RDW 13.2 04/01/2024   LYMPHSABS 1.5 03/29/2024   MONOABS 0.7 03/29/2024   EOSABS 0.0 03/29/2024   BASOSABS 0.1 03/29/2024     Last metabolic panel Lab Results  Component Value Date   NA 137 04/01/2024   K 3.9 04/01/2024   CL 102 04/01/2024   CO2 26 04/01/2024   BUN 10 04/01/2024   CREATININE 0.79 04/01/2024   GLUCOSE 117 (H) 04/01/2024   GFRNONAA >60 04/01/2024   CALCIUM  8.6 (L) 04/01/2024   PHOS 3.6 04/01/2024   PROT 6.1 (L) 04/01/2024   ALBUMIN 3.3 (L) 04/01/2024   BILITOT 2.3 (H) 04/01/2024   ALKPHOS 88 04/01/2024   AST 90 (H) 04/01/2024   ALT 203 (H) 04/01/2024   ANIONGAP 9 04/01/2024    CBG (last 3)  Recent Labs    03/31/24 2126 04/01/24 0725 04/01/24 1229  GLUCAP 109* 129* 201*      Coagulation Profile: No results for input(s): INR, PROTIME in the last 168 hours.   Radiology Studies: No results found.     Elgie Butter M.D. [facility] 04/01/2024, 1:26 PM  Available via Epic secure chat 7am-7pm After 7 pm, please refer to night coverage provider listed on amion.

## 2024-04-01 NOTE — Progress Notes (Signed)
 [facility] Progress Note  CC:  Acute pancreatitis   Subjective: He is having less LUQ pain today, rates pain 5 to 6, yesterday rated 7 to 8. Received Phenergan  prior to full liquid diet without further N/V. He wishes to further advance diet today. He passed 2 brown loose to watery stools yesterday. No bloody or black stools.   Objective:  Vital signs in last 24 hours: Temp:  [98.5 F (36.9 C)-99.1 F (37.3 C)] 98.9 F (37.2 C) (07/09 0415) Pulse Rate:  [72-104] 73 (07/09 0400) Resp:  [10-18] 15 (07/09 0400) BP: (104-141)/(60-82) 123/82 (07/09 0400) SpO2:  [94 %-99 %] 99 % (07/09 0400) Last BM Date : 03/31/24 General: 38 year old male in no acute distress. Heart: Regular rate and rhythm, no murmurs. Pulm: Breath sounds clear throughout. Abdomen: Soft, nondistended.  LUQ tenderness, no guarding.  Spasms quadrants. Extremities: No edema. Neurologic:  Alert and  oriented x 4. Grossly normal neurologically. Psych:  Alert and cooperative. Normal mood and affect.  Intake/Output from previous day: 07/08 0701 - 07/09 0700 In: 888.4 [P.O.:840; I.V.:48.4] Out: -  Intake/Output this shift: No intake/output data recorded.  Lab Results: Recent Labs    03/30/24 0716 03/31/24 0309 04/01/24 0412  WBC 14.8* 9.9 7.5  HGB 16.7 15.2 14.2  HCT 47.7 44.0 42.8  PLT 211 201 160   BMET Recent Labs    03/30/24 0716 03/31/24 0309 04/01/24 0412  NA 132* 135 137  K 4.0 3.3* 3.9  CL 97* 100 102  CO2 21* 26 26  GLUCOSE 240* 66* 117*  BUN 11 8 10   CREATININE 0.77 0.72 0.79  CALCIUM  9.1 8.8* 8.6*   LFT Recent Labs    03/30/24 1000 03/31/24 0309 04/01/24 0412  PROT  --    < > 6.1*  ALBUMIN  --    < > 3.3*  AST  --    < > 90*  ALT  --    < > 203*  ALKPHOS  --    < > 88  BILITOT  --    < > 2.3*  BILIDIR 1.3*  --   --    < > = values in this interval not displayed.   PT/INR No results for input(s): LABPROT, INR in the last 72 hours. Hepatitis  Panel Recent Labs    03/31/24 1043  HEPBSAG NON REACTIVE  HCVAB NON REACTIVE  HEPAIGM NON REACTIVE  HEPBIGM NON REACTIVE    No results found.  Patient Profile: Thamas Appleyard is a 38 y.o. male with a past medical history of anxiety, depression, diabetes mellitus type 2, hyperlipidemia, hypertriglyceridemia, pancreatitis with pseudocyst and gallstones s/p cholecystectomy 10/2023. He presented to the ED 03/29/2024 secondary to having N/V and LUQ concerning for recurrent pancreatitis.   Assessment / Plan:  38 year old male with a history of hypertriglyceridemia and recurrent acute pancreatitis with pseudocyst admitted with N/V and LUQ pain. CTAP with contrast showed a cystic mass in the region of the pancreatic tail abutting the posterior wall of the stomach measuring 6.7 x 6.2 cm (previously measured 6.8 x 5.9 cm per imaging 02/24/2024), findings consistent with a pseudocyst and mild inflammation around the pancreatic body/tail suggesting acute pancreatitis without ductal dilatation. Pancreatitis secondary to hypertriglyceridemia. Lipase 774 -> 751 -> 165 -> 78. Triglycerides 1,370 -> 1,211 -> 982 -> 746 -> 672 -> 436 -> 537 -> 429 -> 323. LFTs downtrending. Normal calcium  level.  No alcohol x 3 months.  Insulin  drip discontinued.  Tolerated full liquid  diet.  Phenergan  controlling nausea, no vomiting.  LUQ pain has decreased.  Afebrile.  Hemodynamically stable. -Advance to soft diet  -Pain management and IV fluids per the hospitalist -Ondansetron  4 mg PO or IV every 6 hours as needed -Continue promethazine  12.5 mg p.o. every 6 hours as needed -Await IgG 4 results  -Hepatic panel and TGs in am -Eventual repeat pancreatic imaging, await further recommendations per Dr. Wilhelmenia   Prior history of gallstones s/p robotic assisted laparoscopic cholecystectomy as an outpatient which was performed by Dr. Vickie 07/05/2023   Hepatic steatosis, elevated LFTs. T. Bili 3. 7 -> 4.2 -> 3.9 -> 2.3. Alk  phos 71 -> 86 -> 88. AST 58 -> 91 -> 355 -> 90. ALT 52 -> 47 -> 297 -> 203.  Indirect bili > direct. Acute hepatitis panel negative. Started on Fenofibrate  and Atorvastatin  7/7. CTAP 7/6 showed diffuse hepatic steatosis. -Hepatic panel in am   DM type II on insulin       Principal Problem:   Acute pancreatitis due to hypertriglyceridemia Active Problems:   Hypertriglyceridemia   Insulin  dependent type 2 diabetes mellitus (HCC)   Hyperlipidemia   GAD (generalized anxiety disorder)   History of cholecystectomy   Pancreatic pseudocyst   Hyponatremia   Chronic transaminitis and hyperbilirubinemia   Pancreatic cyst   Abdominal pain, left upper quadrant     LOS: 2 days   Elida HERO Kennedy-Smith  04/01/2024, 9:09 AM

## 2024-04-02 ENCOUNTER — Telehealth: Payer: Self-pay | Admitting: Nurse Practitioner

## 2024-04-02 DIAGNOSIS — R1012 Left upper quadrant pain: Secondary | ICD-10-CM | POA: Diagnosis not present

## 2024-04-02 DIAGNOSIS — K862 Cyst of pancreas: Secondary | ICD-10-CM | POA: Diagnosis not present

## 2024-04-02 DIAGNOSIS — K859 Acute pancreatitis without necrosis or infection, unspecified: Secondary | ICD-10-CM | POA: Diagnosis not present

## 2024-04-02 DIAGNOSIS — E781 Pure hyperglyceridemia: Secondary | ICD-10-CM | POA: Diagnosis not present

## 2024-04-02 LAB — HEPATIC FUNCTION PANEL
ALT: 140 U/L — ABNORMAL HIGH (ref 0–44)
AST: 35 U/L (ref 15–41)
Albumin: 3.4 g/dL — ABNORMAL LOW (ref 3.5–5.0)
Alkaline Phosphatase: 83 U/L (ref 38–126)
Bilirubin, Direct: 0.3 mg/dL — ABNORMAL HIGH (ref 0.0–0.2)
Indirect Bilirubin: 1.6 mg/dL — ABNORMAL HIGH (ref 0.3–0.9)
Total Bilirubin: 1.9 mg/dL — ABNORMAL HIGH (ref 0.0–1.2)
Total Protein: 6.4 g/dL — ABNORMAL LOW (ref 6.5–8.1)

## 2024-04-02 LAB — GLUCOSE, CAPILLARY
Glucose-Capillary: 172 mg/dL — ABNORMAL HIGH (ref 70–99)
Glucose-Capillary: 202 mg/dL — ABNORMAL HIGH (ref 70–99)
Glucose-Capillary: 144 mg/dL — ABNORMAL HIGH (ref 70–99)
Glucose-Capillary: 179 mg/dL — ABNORMAL HIGH (ref 70–99)

## 2024-04-02 LAB — TRIGLYCERIDES
Triglycerides: 463 mg/dL — ABNORMAL HIGH (ref <150)
Triglycerides: 429 mg/dL — ABNORMAL HIGH (ref <150)
Triglycerides: 342 mg/dL — ABNORMAL HIGH (ref <150)
Triglycerides: 474 mg/dL — ABNORMAL HIGH (ref <150)
Triglycerides: 461 mg/dL — ABNORMAL HIGH (ref <150)
Triglycerides: 333 mg/dL — ABNORMAL HIGH (ref <150)

## 2024-04-02 LAB — LIPASE, BLOOD: Lipase: 53 U/L — ABNORMAL HIGH (ref 11–51)

## 2024-04-02 NOTE — Progress Notes (Signed)
 [facility]                                                                               Doctor Sheahan, is a 38 y.o. male, DOB - [birth year], George Howe Admit date - 03/29/2024    Outpatient Primary MD for the patient is Theophilus Andrews, Tully GRADE, MD  LOS - 3  days    Brief summary   38 yrs old Male with PMH significant of Gall stone pancreatitis status post cholecystectomy in 2024, recurrent pancreatitis, chronic transaminitis and hyperbilirubinemia, pancreatic pseudocyst, insulin -dependent DM type II, generalized anxiety disorder, hyperlipidemia, and morbid obesity presented in the ED complaining of abdominal pain associated with nausea and vomiting. He reported he feels like prior episodes of pancreatitis flare.  Patient reports being social drinker in the last time he drank alcohol was 3 months ago.  Significant labs include lipase 774, AST 58, ALT 52, total bilirubin 3.7 UA glucose 500+, ketones 20.  Triglycerides 1775.  CT A&P shows pancreatic pseudocysts unchanged and evidence of acute pancreatitis.  Patient was admitted for further evaluation, He was started on insulin  drip given hypertriglyceridemia.     Assessment & Plan    Assessment and Plan:  Acute pancreatitis due to hypertriglyceridemia Pancreatic pseudocyst in the setting of gallstone pancreatitis s/p cholecystectomy in 07/2023. Initially started on insulin  gtt, with significant improvement of triglycerides.  Continue with lipitor 40 mg daily and fenofibrate   160 mg daily in the setting of triglyceridemia. Triglyceride level is 474. Lipase has improved to 53. AST today is 35 and ALT is 140. Bilirubin continues to improve.  GI on board and following.  Pain controlled.     Insulin  dependent type 2 DM Continue with Semglee  30 units daily CBG (last 3)  Recent Labs    04/01/24 2130 04/02/24 0727 04/02/24 1152  GLUCAP 209* 172* 202*  Add 2 units TIDAC. To the SSI.   Hypocalcemia Replaced.     Hyperlipidemia Continue with lipitor and fenofibrate .    GAD Continue with Buspar.    Estimated body mass index is 30.89 kg/m as calculated from the following:   Height as of this encounter: 5' 3 (1.6 m).   Weight as of this encounter: 79.1 kg.  Code Status: full code.  DVT Prophylaxis:  heparin  injection 5,000 Units Start: 03/30/24 0600 SCDs Start: 03/30/24 0123 Place TED hose Start: 03/30/24 0123   Level of Care: Level of care: Stepdown Family Communication: none at bedside.   Disposition Plan:     Remains inpatient appropriate:  pending clinical improvement  Procedures:  None.   Consultants:   gastroenterology  Antimicrobials:   Anti-infectives (From admission, onward)    None        Medications  Scheduled Meds:  atorvastatin   40 mg Oral Daily   buPROPion   150 mg Oral Daily   Chlorhexidine  Gluconate Cloth  6 each Topical Daily   fenofibrate   160 mg Oral Daily   heparin   5,000 Units Subcutaneous Q8H   insulin  aspart  0-15 Units Subcutaneous TID WC   insulin  aspart  0-5 Units Subcutaneous QHS   insulin  glargine-yfgn  30 Units Subcutaneous Daily   nicotine   14 mg  Transdermal Daily   ondansetron  (ZOFRAN ) IV  4 mg Intravenous Once   sodium chloride  flush  3 mL Intravenous Q12H   sodium chloride  flush  3 mL Intravenous Q12H   Continuous Infusions: PRN Meds:.acetaminophen  **OR** acetaminophen , HYDROmorphone  (DILAUDID ) injection, mouth rinse, oxyCODONE , promethazine , sodium chloride  flush    Subjective:   George Howe was seen and examined today. Abd pain has improved. Had a large bowel movement this morning.  No nausea or vomiting.   Objective:   Vitals:   04/02/24 1200 04/02/24 1300 04/02/24 1308 04/02/24 1309  BP:   114/78   Pulse: 82 (!) 127  87  Resp: (!) 28 (!) 21  19  Temp: 98.8 F (37.1 C)     TempSrc: Oral     SpO2: 99% 100%  99%  Weight:      Height:        Intake/Output Summary (Last 24 hours) at 04/02/2024 1346 Last data  filed at 04/02/2024 0911 Gross per 24 hour  Intake 10 ml  Output --  Net 10 ml   Filed Weights   03/30/24 0500  Weight: 79.1 kg     Exam General exam: Appears calm and comfortable  Respiratory system: Clear to auscultation. Respiratory effort normal. Cardiovascular system: S1 & S2 heard, RRR. No JVD,  Gastrointestinal system: Abdomen is nondistended, soft and nontender.  Central nervous system: Alert and oriented. No focal neurological deficits. Extremities: Symmetric 5 x 5 power. Skin: No rashes,  Psychiatry: Mood & affect appropriate.      Data Reviewed:  I have personally reviewed following labs and imaging studies   CBC Lab Results  Component Value Date   WBC 7.5 04/01/2024   RBC 4.98 04/01/2024   HGB 14.2 04/01/2024   HCT 42.8 04/01/2024   MCV 85.9 04/01/2024   MCH 28.5 04/01/2024   PLT 160 04/01/2024   MCHC 33.2 04/01/2024   RDW 13.2 04/01/2024   LYMPHSABS 1.5 03/29/2024   MONOABS 0.7 03/29/2024   EOSABS 0.0 03/29/2024   BASOSABS 0.1 03/29/2024     Last metabolic panel Lab Results  Component Value Date   NA 137 04/01/2024   K 3.9 04/01/2024   CL 102 04/01/2024   CO2 26 04/01/2024   BUN 10 04/01/2024   CREATININE 0.79 04/01/2024   GLUCOSE 117 (H) 04/01/2024   GFRNONAA >60 04/01/2024   CALCIUM  8.6 (L) 04/01/2024   PHOS 3.6 04/01/2024   PROT 6.4 (L) 04/02/2024   ALBUMIN 3.4 (L) 04/02/2024   BILITOT 1.9 (H) 04/02/2024   ALKPHOS 83 04/02/2024   AST 35 04/02/2024   ALT 140 (H) 04/02/2024   ANIONGAP 9 04/01/2024    CBG (last 3)  Recent Labs    04/01/24 2130 04/02/24 0727 04/02/24 1152  GLUCAP 209* 172* 202*      Coagulation Profile: No results for input(s): INR, PROTIME in the last 168 hours.   Radiology Studies: No results found.     Elgie Butter M.D. [facility] 04/02/2024, 1:46 PM  Available via Epic secure chat 7am-7pm After 7 pm, please refer to night coverage provider listed on amion.

## 2024-04-02 NOTE — Progress Notes (Signed)
 [facility] Progress Note  CC:  Acute pancreatitis   Subjective: He is tolerating a soft diet, ate an omelette earlier this morning for breakfast.  No nausea or vomiting.  His abdominal pain has significantly improved, currently rates the LUQ pain a 2 or 3 on a scale of 1-10.  He passed a soft brown stool earlier this morning.  No melena or rectal bleeding.  No chest pain or shortness of breath.   Objective:  Vital signs in last 24 hours: Temp:  [97.8 F (36.6 C)-99.1 F (37.3 C)] 98 F (36.7 C) (07/10 0800) Pulse Rate:  [74-111] 74 (07/10 0000) Resp:  [13-21] 13 (07/10 0000) BP: (116-134)/(62-76) 116/62 (07/10 0000) SpO2:  [92 %-100 %] 92 % (07/10 0000) Last BM Date : 03/31/24 General: 38 year old male in no acute distress. Heart: Rate and rhythm, no murmurs. Pulm: Breath sounds clear throughout. Abdomen: Soft, nondistended.  Nontender.  Positive bowel sounds all 4 quadrants.  No hepatosplenomegaly.  No palpable mass. Extremities: No edema. Neurologic:  Alert and oriented x 4. Grossly normal neurologically. Psych:  Alert and cooperative. Normal mood and affect.  Intake/Output from previous day: 07/09 0701 - 07/10 0700 In: 483 [P.O.:480; I.V.:3] Out: -  Intake/Output this shift: Total I/O In: 10 [I.V.:10] Out: -   Lab Results: Recent Labs    03/31/24 0309 04/01/24 0412  WBC 9.9 7.5  HGB 15.2 14.2  HCT 44.0 42.8  PLT 201 160   BMET Recent Labs    03/31/24 0309 04/01/24 0412  NA 135 137  K 3.3* 3.9  CL 100 102  CO2 26 26  GLUCOSE 66* 117*  BUN 8 10  CREATININE 0.72 0.79  CALCIUM  8.8* 8.6*   LFT Recent Labs    04/02/24 0412  PROT 6.4*  ALBUMIN 3.4*  AST 35  ALT 140*  ALKPHOS 83  BILITOT 1.9*  BILIDIR 0.3*  IBILI 1.6*   PT/INR No results for input(s): LABPROT, INR in the last 72 hours. Hepatitis Panel Recent Labs    03/31/24 1043  HEPBSAG NON REACTIVE  HCVAB NON REACTIVE  HEPAIGM NON REACTIVE  HEPBIGM NON REACTIVE     No results found.  Patient Profile: George Howe is a 38 y.o. male with a past medical history of anxiety, depression, diabetes mellitus type 2, hyperlipidemia, hypertriglyceridemia, pancreatitis with pseudocyst and gallstones s/p cholecystectomy 10/2023. He presented to the ED 03/29/2024 secondary to having N/V and LUQ concerning for recurrent pancreatitis.   Assessment / Plan:  38 year old male with a history of hypertriglyceridemia and recurrent acute pancreatitis with pseudocyst admitted with N/V and LUQ pain. CTAP with contrast showed a cystic mass in the region of the pancreatic tail abutting the posterior wall of the stomach measuring 6.7 x 6.2 cm (previously measured 6.8 x 5.9 cm per imaging 02/24/2024), findings consistent with a pseudocyst and mild inflammation around the pancreatic body/tail suggesting acute pancreatitis without ductal dilatation. Pancreatitis secondary to hypertriglyceridemia. Lipase 774 -> 751 -> 165 -> 78. Triglycerides 1,370 -> 1,211 -> 982 -> 746 -> 672 -> 436 -> 537 -> 429 -> 323 -> 363 -> 644 on 7/9 -> today 463 -> 429 -> 342. LFTs downtrending. Normal calcium  level.  No alcohol x 3 months.  Insulin  drip discontinued.  No further nausea or vomiting. Tolerating a soft diet. LUQ pain significantly decreased this morning. Afebrile. Hemodynamically stable. -Continue soft diet  -Pain management and IV fluids per the hospitalist -Ondansetron  4 mg PO or IV every 6 hours as  needed -Continue Promethazine  12.5 mg p.o. every 6 hours as needed -Hepatic panel and TGs in am -If he has progressive worsening of pain, may consider EUS with cyst aspiration.  -Eventual repeat pancreatic imaging -Await further recommendations per Dr. Wilhelmenia.    Prior history of gallstones s/p robotic assisted laparoscopic cholecystectomy as an outpatient which was performed by Dr. Vickie 07/05/2023   Hepatic steatosis, elevated LFTs. LFTs continue to drift downward. Acute hepatitis panel  negative. Started on Fenofibrate  and Atorvastatin  7/7. CTAP 7/6 showed diffuse hepatic steatosis. -Hepatic panel in am   DM type II on insulin         Principal Problem:   Acute pancreatitis due to hypertriglyceridemia Active Problems:   Hypertriglyceridemia   Insulin  dependent type 2 diabetes mellitus (HCC)   Hyperlipidemia   GAD (generalized anxiety disorder)   History of cholecystectomy   Pancreatic pseudocyst   Hyponatremia   Chronic transaminitis and hyperbilirubinemia   Pancreatic cyst   Abdominal pain, left upper quadrant   Left upper quadrant abdominal pain     LOS: 3 days   Elida HERO Kennedy-Smith  04/02/2024, 10:46 AM

## 2024-04-02 NOTE — Inpatient Diabetes Management (Signed)
 Inpatient Diabetes Program Recommendations  AACE/ADA: New Consensus Statement on Inpatient Glycemic Control (2015)  Target Ranges:  Prepandial:   less than 140 mg/dL      Peak postprandial:   less than 180 mg/dL (1-2 hours)      Critically ill patients:  140 - 180 mg/dL   Lab Results  Component Value Date   GLUCAP 202 (H) 04/02/2024   HGBA1C 11.4 (H) 03/30/2024    Review of Glycemic Control  Diabetes history: DM2 Outpatient Diabetes medications: Tresiba  40 daily, Humalog  4-10 units TID, metformin  500 daily with breakfast Current orders for Inpatient glycemic control: Semglee  30 daily, Novolog  0-15 TID with meals and 0-5 HS  HgbA1C - 11.4% 172, 202 today CBGs 7/9: 117-209 Triglycerides much improved - 474  Inpatient Diabetes Program Recommendations:    Consider adding Novolog  2 units TID with meals if eating > 50%  Add carb-mod medium to soft diet  Will continue to follow.   Thank you. Shona Brandy, RD, LDN, CDCES Inpatient Diabetes Coordinator [phone]

## 2024-04-02 NOTE — Telephone Encounter (Signed)
 George Howe, patient is currently admitted to [facility], hopefully will be discharged home in the next few days.  Please contact the patient tomorrow and schedule him for a follow-up appointment with Dr. Abran or POD A APP in 3 to 4 weeks.   Linda/Dottie, Pls schedule patient for a follow up CT abdomen pancreas protocol with IV contrast in 2 months.  THX

## 2024-04-02 NOTE — Plan of Care (Signed)
  Problem: Coping: Goal: Ability to adjust to condition or change in health will improve Outcome: Progressing   Problem: Health Behavior/Discharge Planning: Goal: Ability to manage health-related needs will improve Outcome: Progressing   Problem: Metabolic: Goal: Ability to maintain appropriate glucose levels will improve Outcome: Progressing   Problem: Nutritional: Goal: Maintenance of adequate nutrition will improve Outcome: Progressing   Problem: Education: Goal: Knowledge of General Education information will improve Description: Including pain rating scale, medication(s)/side effects and non-pharmacologic comfort measures Outcome: Progressing   Problem: Clinical Measurements: Goal: Ability to maintain clinical measurements within normal limits will improve Outcome: Progressing Goal: Will remain free from infection Outcome: Progressing Goal: Diagnostic test results will improve Outcome: Progressing   Problem: Activity: Goal: Risk for activity intolerance will decrease Outcome: Progressing   Problem: Pain Managment: Goal: General experience of comfort will improve and/or be controlled Outcome: Progressing

## 2024-04-02 NOTE — Plan of Care (Signed)

## 2024-04-03 ENCOUNTER — Other Ambulatory Visit (HOSPITAL_BASED_OUTPATIENT_CLINIC_OR_DEPARTMENT_OTHER): Payer: Self-pay

## 2024-04-03 DIAGNOSIS — E781 Pure hyperglyceridemia: Secondary | ICD-10-CM | POA: Diagnosis not present

## 2024-04-03 DIAGNOSIS — K858 Other acute pancreatitis without necrosis or infection: Secondary | ICD-10-CM | POA: Diagnosis not present

## 2024-04-03 DIAGNOSIS — E119 Type 2 diabetes mellitus without complications: Secondary | ICD-10-CM | POA: Diagnosis not present

## 2024-04-03 DIAGNOSIS — E871 Hypo-osmolality and hyponatremia: Secondary | ICD-10-CM | POA: Diagnosis not present

## 2024-04-03 LAB — COMPREHENSIVE METABOLIC PANEL WITH GFR
ALT: 115 U/L — ABNORMAL HIGH (ref 0–44)
AST: 38 U/L (ref 15–41)
Albumin: 3.5 g/dL (ref 3.5–5.0)
Alkaline Phosphatase: 88 U/L (ref 38–126)
Anion gap: 9 (ref 5–15)
BUN: 15 mg/dL (ref 6–20)
CO2: 23 mmol/L (ref 22–32)
Calcium: 8.8 mg/dL — ABNORMAL LOW (ref 8.9–10.3)
Chloride: 102 mmol/L (ref 98–111)
Creatinine, Ser: 0.93 mg/dL (ref 0.61–1.24)
GFR, Estimated: >60 mL/min (ref >60)
Glucose, Bld: 127 mg/dL — ABNORMAL HIGH (ref 70–99)
Potassium: 4.4 mmol/L (ref 3.5–5.1)
Sodium: 134 mmol/L — ABNORMAL LOW (ref 135–145)
Total Bilirubin: 2.2 mg/dL — ABNORMAL HIGH (ref 0.0–1.2)
Total Protein: 6.3 g/dL — ABNORMAL LOW (ref 6.5–8.1)

## 2024-04-03 LAB — GLUCOSE, CAPILLARY
Glucose-Capillary: 142 mg/dL — ABNORMAL HIGH (ref 70–99)
Glucose-Capillary: 204 mg/dL — ABNORMAL HIGH (ref 70–99)
Glucose-Capillary: 168 mg/dL — ABNORMAL HIGH (ref 70–99)

## 2024-04-03 LAB — TRIGLYCERIDES
Triglycerides: 278 mg/dL — ABNORMAL HIGH (ref <150)
Triglycerides: 292 mg/dL — ABNORMAL HIGH (ref <150)
Triglycerides: 295 mg/dL — ABNORMAL HIGH (ref <150)

## 2024-04-03 LAB — LIPASE, BLOOD: Lipase: 46 U/L (ref 11–51)

## 2024-04-03 MED ORDER — FENOFIBRATE 160 MG PO TABS
160.0000 mg | ORAL_TABLET | Freq: Every day | ORAL | 3 refills | Status: DC
  Filled 2024-04-03: qty 30, 30d supply, fill #0

## 2024-04-03 MED ORDER — TRESIBA FLEXTOUCH 100 UNIT/ML ~~LOC~~ SOPN
30.0000 [IU] | PEN_INJECTOR | Freq: Every day | SUBCUTANEOUS | Status: DC
Start: 2024-04-03 — End: 2024-04-09

## 2024-04-03 MED ORDER — ATORVASTATIN CALCIUM 40 MG PO TABS
40.0000 mg | ORAL_TABLET | Freq: Every day | ORAL | 3 refills | Status: DC
  Filled 2024-04-03: qty 30, 30d supply, fill #0

## 2024-04-03 MED ORDER — NICOTINE 14 MG/24HR TD PT24
14.0000 mg | MEDICATED_PATCH | Freq: Every day | TRANSDERMAL | 0 refills | Status: DC
  Filled 2024-04-03: qty 28, 28d supply, fill #0

## 2024-04-03 NOTE — Telephone Encounter (Signed)
 CT reminder placed in EPIC to be completed a round 06/04/24.

## 2024-04-03 NOTE — Discharge Summary (Signed)
 Physician Discharge Summary   Patient: George Howe MRN: [id number] DOB: [birth year]  Admit date:     03/29/2024  Discharge date: 04/03/24  Discharge Physician: Elgie Butter   PCP: Theophilus Andrews, Tully GRADE, MD   Recommendations at discharge:  Please follow up with GI as recommended.  Please follow up with PCP in one week and recheck liver panel and triglyceride levels.   Discharge Diagnoses: Principal Problem:   Acute pancreatitis due to hypertriglyceridemia Active Problems:   Insulin  dependent type 2 diabetes mellitus (HCC)   History of cholecystectomy   Hypertriglyceridemia   Hyperlipidemia   GAD (generalized anxiety disorder)   Pancreatic pseudocyst   Hyponatremia   Chronic transaminitis and hyperbilirubinemia   Pancreatic cyst   Abdominal pain, left upper quadrant   Left upper quadrant abdominal pain  Resolved Problems:   * No resolved hospital problems. *  Hospital Course: 38 yrs old Male with PMH significant of Gall stone pancreatitis status post cholecystectomy in 2024, recurrent pancreatitis, chronic transaminitis and hyperbilirubinemia, pancreatic pseudocyst, insulin -dependent DM type II, generalized anxiety disorder, hyperlipidemia, and morbid obesity presented in the ED complaining of abdominal pain associated with nausea and vomiting. He reported he feels like prior episodes of pancreatitis flare.  Patient reports being social drinker in the last time he drank alcohol was 3 months ago.  Significant labs include lipase 774, AST 58, ALT 52, total bilirubin 3.7 UA glucose 500+, ketones 20.  Triglycerides 1775.  CT A&P shows pancreatic pseudocysts unchanged and evidence of acute pancreatitis.  Patient was admitted for further evaluation, He was started on insulin  drip given hypertriglyceridemia.     Assessment and Plan:  Acute pancreatitis due to hypertriglyceridemia Pancreatic pseudocyst in the setting of gallstone pancreatitis s/p cholecystectomy in  07/2023. Initially started on insulin  gtt, with significant improvement of triglycerides.  Continue with lipitor 40 mg daily and fenofibrate   160 mg daily in the setting of triglyceridemia. Triglyceride levels has significantly improved.  Lipase has improved to 53. Liver enzymes were improving.  GI on board and following, recommended outpatient follow up in 3 to 4 weeks with a repeat CT abd and pelvis.  Patient requested referral to [facility].  GI Aware and the paperwork will be sent for referral.        Insulin  dependent type 2 DM Continue with Semglee  30 units .   Hypocalcemia Replaced.      Hyperlipidemia Continue with lipitor and fenofibrate .      GAD Continue with Buspar.      Estimated body mass index is 30.89 kg/m as calculated from the following:   Height as of this encounter: 5' 3 (1.6 m).   Weight as of this encounter: 79.1 kg.     Consultants: GI Procedures performed: none  Disposition: Home Diet recommendation: soft diet.   DISCHARGE MEDICATION: Allergies as of 04/03/2024   No Known Allergies      Medication List     TAKE these medications    atorvastatin  40 MG tablet Commonly known as: LIPITOR Take 1 tablet (40 mg total) by mouth daily. Start taking on: April 04, 2024   buPROPion  150 MG 24 hr tablet Commonly known as: Wellbutrin  XL Take 1 tablet (150 mg total) by mouth daily.   cetirizine  10 MG tablet Commonly known as: ZYRTEC  Take 1 tablet (10 mg total) by mouth daily as needed for allergies.   fenofibrate  160 MG tablet Take 1 tablet (160 mg total) by mouth daily. Start taking on: April 04, 2024  FreeStyle Libre 3 Sensor Misc Place 1 sensor on the skin every 14 days. Use to check glucose continuously   Gvoke HypoPen  1-Pack 1 MG/0.2ML Soaj Generic drug: Glucagon  1 mg (1 mL) injected subcutaneously or intramuscularly into the upper arm, thigh, or buttocks, or intravenously for hypoglycemia   insulin  lispro 100 UNIT/ML  KwikPen Commonly known as: HumaLOG  KwikPen Max daily 45 units daily What changed:  how much to take how to take this when to take this reasons to take this   metFORMIN  500 MG 24 hr tablet Commonly known as: GLUCOPHAGE -XR Take 1 tablet (500 mg total) by mouth daily with breakfast.   multivitamin with minerals tablet Take 1 tablet by mouth daily.   nicotine  14 mg/24hr patch Commonly known as: NICODERM CQ  - dosed in mg/24 hours Place 1 patch (14 mg total) onto the skin daily. Start taking on: April 04, 2024   ondansetron  4 MG disintegrating tablet Commonly known as: ZOFRAN -ODT Dissolve 1 tablet under the tongue every 6 (six) hours as needed for nausea or vomiting.   oxyCODONE  5 MG immediate release tablet Commonly known as: Roxicodone  Take 1 tablet (5 mg total) by mouth every 6 (six) hours as needed for severe pain (pain score 7-10).   TechLite Pen Needles 32G X 4 MM Misc Generic drug: Insulin  Pen Needle Use in the morning, at noon, in the evening, and at bedtime   Tresiba  FlexTouch 100 UNIT/ML FlexTouch Pen Generic drug: insulin  degludec Inject 30 Units into the skin daily. What changed: how much to take        Follow-up Information     Kothari, Perri PARAS, MD. Call in 1 week(s).   Specialty: Gastroenterology Why: FOR RECURRENT PANCREATITIS. Contact information: [address] MEDICINE [facility] [place] [address] [phone]         Theophilus Andrews, Tully GRADE, MD Follow up in 2 week(s).   Specialty: Internal Medicine Why: check liver function tests,  and triglyceride levels. Contact information: [address] [place] [address] [phone]                Discharge Exam: Filed Weights   03/30/24 0500 04/03/24 0100  Weight: 79.1 kg 85.3 kg   General exam: Appears calm and comfortable  Respiratory system: Clear to auscultation. Respiratory effort normal. Cardiovascular system: S1 & S2 heard, RRR. No JVD Gastrointestinal system:  Abdomen is nondistended, soft and nontender.  Central nervous system: Alert and oriented. No focal neurological deficits. Extremities: Symmetric 5 x 5 power. Skin: No rashes, lesions or ulcers Psychiatry: Judgement and insight appear normal. Mood & affect appropriate.    Condition at discharge: fair  The results of significant diagnostics from this hospitalization (including imaging, microbiology, ancillary and laboratory) are listed below for reference.   Imaging Studies: CT ABDOMEN PELVIS W CONTRAST Result Date: 03/30/2024 CLINICAL DATA:  Abdominal pain EXAM: CT ABDOMEN AND PELVIS WITH CONTRAST TECHNIQUE: Multidetector CT imaging of the abdomen and pelvis was performed using the standard protocol following bolus administration of intravenous contrast. RADIATION DOSE REDUCTION: This exam was performed according to the departmental dose-optimization program which includes automated exposure control, adjustment of the mA and/or kV according to patient size and/or use of iterative reconstruction technique. CONTRAST:  OMNIPAQUE  IOHEXOL  300 MG/ML  SOLN COMPARISON:  02/24/2024 FINDINGS: Lower chest: No acute abnormality Hepatobiliary: Prior cholecystectomy. Diffuse fatty infiltration of the liver. No focal hepatic abnormality. Pancreas: Cystic mass in the region of the pancreatic tail abutting the posterior wall of the stomach  again noted measuring 6.7 x 6.2 cm compared to 6.8 x 5.9 cm previously, not significantly changed. Mild inflammation noted around the pancreatic body and tail suggesting acute pancreatitis. No ductal dilatation. Spleen: No focal abnormality.  Normal size. Adrenals/Urinary Tract: No adrenal abnormality. No focal renal abnormality. No stones or hydronephrosis. Urinary bladder is unremarkable. Stomach/Bowel: Stomach, large and small bowel grossly unremarkable. Normal appendix. Vascular/Lymphatic: No evidence of aneurysm or adenopathy. Reproductive: No visible focal abnormality.  Other: No free fluid or free air. Musculoskeletal: No acute bony abnormality. Chronic bilateral L5 pars defects. IMPRESSION: 6.7 cm cystic mass in the region of the pancreatic tail, similar to prior study, most compatible with pseudo cyst. Inflammatory stranding noted around the pancreatic body and tail compatible with acute pancreatitis. Fatty liver. Electronically Signed   By: Franky Crease M.D.   On: 03/30/2024 00:15    Microbiology: Results for orders placed or performed during the hospital encounter of 01/31/23  MRSA Next Gen by PCR, Nasal     Status: None   Collection Time: 01/31/23  3:17 PM   Specimen: Nasal Mucosa; Nasal Swab  Result Value Ref Range Status   MRSA by PCR Next Gen NOT DETECTED NOT DETECTED Final    Comment: (NOTE) The GeneXpert MRSA Assay (FDA approved for NASAL specimens only), is one component of a comprehensive MRSA colonization surveillance program. It is not intended to diagnose MRSA infection nor to guide or monitor treatment for MRSA infections. Test performance is not FDA approved in patients less than 25 years old. Performed at [facility], [address], [place], [place] [address]   Culture, blood (Routine X 2) w Reflex to ID Panel     Status: None   Collection Time: 01/31/23  4:20 PM   Specimen: BLOOD RIGHT ARM  Result Value Ref Range Status   Specimen Description   Final    BLOOD RIGHT ARM Performed at [facility], [address] [place], [place] [address]    Special Requests   Final    BOTTLES DRAWN AEROBIC ONLY Blood Culture adequate volume Performed at [facility], [address], [place], [place] [address]    Culture   Final    NO GROWTH 5 DAYS Performed at [facility], [address] [place], [place] [address]    Report Status 02/05/2023 FINAL  Final  Culture, blood (Routine X 2) w Reflex to ID Panel     Status: None   Collection Time: 01/31/23  4:20 PM   Specimen: BLOOD RIGHT HAND   Result Value Ref Range Status   Specimen Description   Final    BLOOD RIGHT HAND Performed at [facility], [address] [place], [place] [address]    Special Requests   Final    BOTTLES DRAWN AEROBIC ONLY Blood Culture adequate volume Performed at [facility], [address], [place], [place] [address]    Culture   Final    NO GROWTH 5 DAYS Performed at [facility], [address], [place], [place] [address]    Report Status 02/05/2023 FINAL  Final  Culture, blood (Routine X 2) w Reflex to ID Panel     Status: None   Collection Time: 02/08/23  9:01 AM   Specimen: BLOOD  Result Value Ref Range Status   Specimen Description   Final    BLOOD BLOOD RIGHT ARM Performed at [facility], [address] [place],  [place] [address]    Special Requests   Final    BOTTLES DRAWN AEROBIC AND ANAEROBIC Blood Culture adequate volume Performed at [facility], [address], [place], [place] [address]    Culture   Final    NO GROWTH 5 DAYS Performed at [facility], [address] [place], [place] [address]    Report Status 02/13/2023 FINAL  Final  Culture, blood (Routine X 2) w Reflex to ID Panel     Status: None   Collection Time: 02/08/23  9:09 AM   Specimen: BLOOD  Result Value Ref Range Status   Specimen Description   Final    BLOOD BLOOD RIGHT ARM Performed at [facility], [address], [place], [place] [address]    Special Requests   Final    BOTTLES DRAWN AEROBIC AND ANAEROBIC Blood Culture adequate volume Performed at [facility], [address] [place], [place] [address]    Culture   Final    NO GROWTH 5 DAYS Performed at [facility], [address], [place], [place] [address]    Report Status 02/13/2023 FINAL  Final    Labs: CBC: Recent Labs  Lab 03/29/24 1916 03/29/24 2306 03/30/24 0716 03/31/24 0309 04/01/24 0412  WBC 17.0*  --   14.8* 9.9 7.5  NEUTROABS 14.7*  --   --   --   --   HGB 18.3* 16.7 16.7 15.2 14.2  HCT 51.6 49.0 47.7 44.0 42.8  MCV 82.3  --  83.4 82.7 85.9  PLT 297  --  211 201 160   Basic Metabolic Panel: Recent Labs  Lab 03/29/24 2303 03/29/24 2306 03/30/24 0716 03/31/24 0309 04/01/24 0412 04/03/24 0346  NA 132* 133* 132* 135 137 134*  K 5.8* 4.8 4.0 3.3* 3.9 4.4  CL 95* 102 97* 100 102 102  CO2 23  --  21* 26 26 23   GLUCOSE 326* 335* 240* 66* 117* 127*  BUN 16 19 11 8 10 15   CREATININE 0.96 0.90 0.77 0.72 0.79 0.93  CALCIUM  8.8*  --  9.1 8.8* 8.6* 8.8*  MG  --   --   --  2.0 2.0  --   PHOS  --   --   --  3.4 3.6  --    Liver Function Tests: Recent Labs  Lab 03/30/24 0716 03/31/24 0309 04/01/24 0412 04/02/24 0412 04/03/24 0346  AST 91* 355* 90* 35 38  ALT 47* 297* 203* 140* 115*  ALKPHOS 71 86 88 83 88  BILITOT 4.2* 3.9* 2.3* 1.9* 2.2*  PROT 6.6 6.6 6.1* 6.4* 6.3*  ALBUMIN 3.9 3.7 3.3* 3.4* 3.5   CBG: Recent Labs  Lab 04/02/24 1621 04/02/24 2201 04/03/24 0730 04/03/24 1118 04/03/24 1537  GLUCAP 144* 179* 142* 204* 168*    Discharge time spent: 45 min  Signed: Elgie Butter, MD [facility] 04/03/2024

## 2024-04-03 NOTE — Progress Notes (Signed)
 I met with George Howe to review advance directive packet and provide education.  He wants to assign his roommate and will complete this when he is able.  I provided instructions for how to get document notarized and scanned to his electronic medical record after discharge.

## 2024-04-03 NOTE — Plan of Care (Signed)

## 2024-04-03 NOTE — Plan of Care (Signed)
   Problem: Education: Goal: Ability to describe self-care measures that may prevent or decrease complications (Diabetes Survival Skills Education) will improve Outcome: Progressing   Problem: Coping: Goal: Ability to adjust to condition or change in health will improve Outcome: Progressing   Problem: Fluid Volume: Goal: Ability to maintain a balanced intake and output will improve Outcome: Progressing

## 2024-04-03 NOTE — Progress Notes (Signed)
 [facility] Progress Note  CC:  Acute pancreatitis   Subjective: He feels well today.  No nausea or vomiting.  No abdominal pain.  He continues to tolerate a solid diet. No BM today. He questions if he can go home today. Patient asked that I speak with his brother Carlin via conference call. His brother (advocate) requested a hepatobiliary referral as it remains unclear why the patient has recurrent significant pancreatitis with flares of hypertriglyceridemia.    Objective:  Vital signs in last 24 hours: Temp:  [98.1 F (36.7 C)-99.4 F (37.4 C)] 98.6 F (37 C) (07/11 1200) Pulse Rate:  [67-127] 79 (07/11 1000) Resp:  [11-21] 13 (07/11 1000) BP: (113-119)/(61-85) 119/85 (07/11 0800) SpO2:  [93 %-100 %] 94 % (07/11 1000) Weight:  [85.3 kg] 85.3 kg (07/11 0100) Last BM Date : 04/02/24 General: Alert 38 year old male in no acute distress. Heart: Regular rate and rhythm, no murmurs. Pulm: Breath sounds clear throughout. Abdomen: Soft, nondistended.  Nontender.  Positive bowel sounds to all 4 quadrants. Extremities:  No edema. Neurologic:  Alert and  oriented x 4. Grossly normal neurologically. Psych:  Alert and cooperative. Normal mood and affect.  Intake/Output from previous day: 07/10 0701 - 07/11 0700 In: 13 [I.V.:13] Out: -  Intake/Output this shift: No intake/output data recorded.  Lab Results: Recent Labs    04/01/24 0412  WBC 7.5  HGB 14.2  HCT 42.8  PLT 160   BMET Recent Labs    04/01/24 0412 04/03/24 0346  NA 137 134*  K 3.9 4.4  CL 102 102  CO2 26 23  GLUCOSE 117* 127*  BUN 10 15  CREATININE 0.79 0.93  CALCIUM  8.6* 8.8*   LFT Recent Labs    04/02/24 0412 04/03/24 0346  PROT 6.4* 6.3*  ALBUMIN 3.4* 3.5  AST 35 38  ALT 140* 115*  ALKPHOS 83 88  BILITOT 1.9* 2.2*  BILIDIR 0.3*  --   IBILI 1.6*  --    PT/INR No results for input(s): LABPROT, INR in the last 72 hours. Hepatitis Panel No results for input(s):  HEPBSAG, HCVAB, HEPAIGM, HEPBIGM in the last 72 hours.  No results found.  Patient Profile: George Howe is a 38 y.o. male with a past medical history of anxiety, depression, diabetes mellitus type 2, hyperlipidemia, hypertriglyceridemia, pancreatitis with pseudocyst and gallstones s/p cholecystectomy 10/2023. He presented to the ED 03/29/2024 secondary to having N/V and LUQ concerning for recurrent pancreatitis.   Assessment / Plan:  38 year old male with a history of hypertriglyceridemia and recurrent acute pancreatitis with pseudocyst admitted with N/V and LUQ pain. CTAP with contrast showed a cystic mass in the region of the pancreatic tail abutting the posterior wall of the stomach measuring 6.7 x 6.2 cm (previously measured 6.8 x 5.9 cm per imaging 02/24/2024), findings consistent with a pseudocyst and mild inflammation around the pancreatic body/tail suggesting acute pancreatitis without ductal dilatation. Pancreatitis secondary to hypertriglyceridemia. Triglyceride levels 333 -> 292 -> 278.  Lipase 46.  Total bili 2.2.  Alk phos 88.  AST 38.  ALT 115.  Normal calcium  level.  No alcohol x 3 months.  Insulin  drip discontinued.  No further nausea or vomiting. Tolerating a soft diet.  No further abdominal pain.  Afebrile. Hemodynamically stable. -Continue soft diet  -Pain management and IV fluids per the hospitalist -Ondansetron  4 mg PO or IV every 6 hours as needed -Continue Promethazine  12.5 mg p.o. every 6 hours as needed -Hepatic panel and TGs in  am -If he has progressive worsening of pain, may consider EUS with cyst aspiration.  -Our office will contact patient to schedule follow-up appointment with the patient's primary GI Dr. Abran or POD A APP in 3 to 4 weeks. Schedule follow-up CT abdomen pancreas protocol with IV contrast in 2 months -Refer to [facility] team as discussed with patient and brother which is reasonable. I discussed referring specifically to  pancreatologist George Howe at [facility]. Patient and brother to discuss further and will let us  know what they decide.  -Await further recommendations per George Howe.    Prior history of gallstones s/p robotic assisted laparoscopic cholecystectomy as an outpatient which was performed by George Howe 07/05/2023   Hepatic steatosis, elevated LFTs. LFTs continue to drift downward. Acute hepatitis panel negative. Started on Fenofibrate  and Atorvastatin  7/7. CTAP 7/6 showed diffuse hepatic steatosis. -Hepatic panel in am   DM type II on insulin       Principal Problem:   Acute pancreatitis due to hypertriglyceridemia Active Problems:   Hypertriglyceridemia   Insulin  dependent type 2 diabetes mellitus (HCC)   Hyperlipidemia   GAD (generalized anxiety disorder)   History of cholecystectomy   Pancreatic pseudocyst   Hyponatremia   Chronic transaminitis and hyperbilirubinemia   Pancreatic cyst   Abdominal pain, left upper quadrant   Left upper quadrant abdominal pain     LOS: 4 days   George Howe  04/03/2024, 1:23 PM

## 2024-04-03 NOTE — Plan of Care (Signed)
  Problem: Fluid Volume: Goal: Ability to maintain a balanced intake and output will improve Outcome: Progressing   Problem: Education: Goal: Ability to describe self-care measures that may prevent or decrease complications (Diabetes Survival Skills Education) will improve Outcome: Adequate for Discharge Goal: Individualized Educational Video(s) Outcome: Adequate for Discharge   Problem: Coping: Goal: Ability to adjust to condition or change in health will improve Outcome: Adequate for Discharge   Problem: Health Behavior/Discharge Planning: Goal: Ability to identify and utilize available resources and services will improve Outcome: Adequate for Discharge Goal: Ability to manage health-related needs will improve Outcome: Adequate for Discharge   Problem: Metabolic: Goal: Ability to maintain appropriate glucose levels will improve Outcome: Adequate for Discharge   Problem: Nutritional: Goal: Maintenance of adequate nutrition will improve Outcome: Adequate for Discharge Goal: Progress toward achieving an optimal weight will improve Outcome: Adequate for Discharge   Problem: Skin Integrity: Goal: Risk for impaired skin integrity will decrease Outcome: Adequate for Discharge   Problem: Tissue Perfusion: Goal: Adequacy of tissue perfusion will improve Outcome: Adequate for Discharge   Problem: Education: Goal: Knowledge of General Education information will improve Description: Including pain rating scale, medication(s)/side effects and non-pharmacologic comfort measures Outcome: Adequate for Discharge   Problem: Health Behavior/Discharge Planning: Goal: Ability to manage health-related needs will improve Outcome: Adequate for Discharge   Problem: Clinical Measurements: Goal: Ability to maintain clinical measurements within normal limits will improve Outcome: Adequate for Discharge Goal: Will remain free from infection Outcome: Adequate for Discharge Goal: Diagnostic  test results will improve Outcome: Adequate for Discharge Goal: Respiratory complications will improve Outcome: Adequate for Discharge Goal: Cardiovascular complication will be avoided Outcome: Adequate for Discharge   Problem: Activity: Goal: Risk for activity intolerance will decrease Outcome: Adequate for Discharge   Problem: Nutrition: Goal: Adequate nutrition will be maintained Outcome: Adequate for Discharge   Problem: Coping: Goal: Level of anxiety will decrease Outcome: Adequate for Discharge   Problem: Elimination: Goal: Will not experience complications related to bowel motility Outcome: Adequate for Discharge Goal: Will not experience complications related to urinary retention Outcome: Adequate for Discharge   Problem: Pain Managment: Goal: General experience of comfort will improve and/or be controlled Outcome: Adequate for Discharge   Problem: Safety: Goal: Ability to remain free from injury will improve Outcome: Adequate for Discharge   Problem: Skin Integrity: Goal: Risk for impaired skin integrity will decrease Outcome: Adequate for Discharge

## 2024-04-06 ENCOUNTER — Other Ambulatory Visit: Payer: Self-pay

## 2024-04-06 ENCOUNTER — Telehealth: Payer: Self-pay

## 2024-04-06 NOTE — Transitions of Care (Post Inpatient/ED Visit) (Signed)
 04/06/2024  Name: George Howe MRN: [id number] DOB: June 10, 1986  Today's TOC FU Call Status: Today's TOC FU Call Status:: Successful TOC FU Call Completed TOC FU Call Complete Date: 04/06/24 Patient's Name and Date of Birth confirmed.  Transition Care Management Follow-up Telephone Call Date of Discharge: 04/03/24 Discharge Facility: [facility]) Type of Discharge: Inpatient Admission Primary Inpatient Discharge Diagnosis:: pancreatitis How have you been since you were released from the hospital?: Better Any questions or concerns?: No  Items Reviewed: Did you receive and understand the discharge instructions provided?: Yes Medications obtained,verified, and reconciled?: Yes (Medications Reviewed) Any new allergies since your discharge?: No Dietary orders reviewed?: Yes Do you have support at home?: Yes People in Home [RPT]: sibling(s)  Medications Reviewed Today: Medications Reviewed Today     Reviewed by Emmitt Pan, LPN (Licensed Practical Nurse) on 04/06/24 at 1030  Med List Status: <None>   Medication Order Taking? Sig Documenting Provider Last Dose Status Informant  atorvastatin  (LIPITOR) 40 MG tablet [id number] Yes Take 1 tablet (40 mg total) by mouth daily. Akula, Vijaya, MD  Active   buPROPion  (WELLBUTRIN  XL) 150 MG 24 hr tablet [id number] Yes Take 1 tablet (150 mg total) by mouth daily. Theophilus Andrews, Tully GRADE, MD  Active Self  cetirizine  (ZYRTEC ) 10 MG tablet [id number]  Take 1 tablet (10 mg total) by mouth daily as needed for allergies.  Patient not taking: Reported on 04/06/2024   Theophilus Andrews, Tully GRADE, MD  Active Self           Med Note LEONARDO SUZEN CROME   Thu Jan 31, 2023  1:36 PM) prn  Continuous Glucose Sensor (FREESTYLE LIBRE 3 [place]) [place] [id number] Yes Place 1 sensor on the skin every 14 days. Use to check glucose continuously Theophilus Andrews, Tully GRADE, MD  Active Self  fenofibrate  160 MG tablet [id number] Yes Take 1 tablet (160 mg total)  by mouth daily. Akula, Vijaya, MD  Active   Glucagon  1 MG/0.2ML EMMANUEL [id number] Yes 1 mg (1 mL) injected subcutaneously or intramuscularly into the upper arm, thigh, or buttocks, or intravenously for hypoglycemia Theophilus Andrews, Tully GRADE, MD  Active Self  insulin  degludec (TRESIBA  FLEXTOUCH) 100 UNIT/ML FlexTouch Pen [id number] Yes Inject 30 Units into the skin daily. Akula, Vijaya, MD  Active   insulin  lispro (HUMALOG  KWIKPEN) 100 UNIT/ML KwikPen [id number] Yes Max daily 45 units daily  Patient taking differently: Inject 4-10 Units into the skin 3 (three) times daily as needed. Max daily 45 units daily   Shamleffer, Ibtehal Jaralla, MD  Active Self  Insulin  Pen Needle (PEN NEEDLES) 32G X 4 MM MISC [id number] Yes Use in the morning, at noon, in the evening, and at bedtime Shamleffer, Donell Cardinal, MD  Active Self  metFORMIN  (GLUCOPHAGE -XR) 500 MG 24 hr tablet [id number] Yes Take 1 tablet (500 mg total) by mouth daily with breakfast. Shamleffer, Ibtehal Jaralla, MD  Active Self  Multiple Vitamins-Minerals (MULTIVITAMIN WITH MINERALS) tablet [id number] Yes Take 1 tablet by mouth daily. [provider]  Active Self  nicotine  (NICODERM CQ  - DOSED IN MG/24 HOURS) 14 mg/24hr patch [id number] Yes Place 1 patch (14 mg total) onto the skin daily. Akula, Vijaya, MD  Active   ondansetron  (ZOFRAN -ODT) 4 MG disintegrating tablet [id number]  Dissolve 1 tablet under the tongue every 6 (six) hours as needed for nausea or vomiting. Suellen Cantor A, PA-C  Active Self  oxyCODONE  (ROXICODONE ) 5 MG immediate release tablet [id number]  Take 1 tablet (5 mg total) by mouth every  6 (six) hours as needed for severe pain (pain score 7-10). Suellen Sherran LABOR, PA-C  Active Self            Home Care and Equipment/Supplies: Were Home Health Services Ordered?: NA Any new equipment or medical supplies ordered?: NA  Functional Questionnaire: Do you need assistance with bathing/showering or dressing?: No Do you need  assistance with meal preparation?: No Do you need assistance with eating?: No Do you have difficulty maintaining continence: No Do you need assistance with getting out of bed/getting out of a chair/moving?: No Do you have difficulty managing or taking your medications?: No  Follow up appointments reviewed: PCP Follow-up appointment confirmed?: Yes Date of PCP follow-up appointment?: 04/09/24 Follow-up Provider: [facility] Follow-up appointment confirmed?: Yes Date of Specialist follow-up appointment?: 06/02/24 Follow-Up Specialty Provider:: GI Do you need transportation to your follow-up appointment?: No Do you understand care options if your condition(s) worsen?: Yes-patient verbalized understanding    SIGNATURE Julian Lemmings, LPN [facility] Nurse Health Advisor Direct Dial  [phone]

## 2024-04-06 NOTE — Telephone Encounter (Signed)
 Referral made to [facility] records faxed

## 2024-04-06 NOTE — Telephone Encounter (Signed)
-----   Message from [facility] sent at 04/04/2024  5:29 AM EDT ----- Regarding: Referral Harly Pipkins, This patient needs a referral to [facility] medical pancreatology, Dr. Jodean. Please place referral for recurrent pancreatitis (history gallstone and triglycerides). This can be placed under Dr. Nancyann name or my name as the referring provider, and you can send next week. Patient to be discharged this weekend and medicine service has sent their facesheet to [facility] as well. Thanks. GM

## 2024-04-09 ENCOUNTER — Other Ambulatory Visit (HOSPITAL_BASED_OUTPATIENT_CLINIC_OR_DEPARTMENT_OTHER): Payer: Self-pay

## 2024-04-09 ENCOUNTER — Encounter: Payer: Self-pay | Admitting: Internal Medicine

## 2024-04-09 ENCOUNTER — Ambulatory Visit: Admitting: Internal Medicine

## 2024-04-09 ENCOUNTER — Other Ambulatory Visit: Payer: Self-pay

## 2024-04-09 VITALS — BP 110/80 | HR 83 | Temp 98.8°F | Wt 192.0 lb

## 2024-04-09 DIAGNOSIS — E781 Pure hyperglyceridemia: Secondary | ICD-10-CM

## 2024-04-09 DIAGNOSIS — Z794 Long term (current) use of insulin: Secondary | ICD-10-CM | POA: Diagnosis not present

## 2024-04-09 DIAGNOSIS — E119 Type 2 diabetes mellitus without complications: Secondary | ICD-10-CM | POA: Diagnosis not present

## 2024-04-09 DIAGNOSIS — Z09 Encounter for follow-up examination after completed treatment for conditions other than malignant neoplasm: Secondary | ICD-10-CM | POA: Diagnosis not present

## 2024-04-09 LAB — COMPREHENSIVE METABOLIC PANEL WITH GFR
ALT: 42 U/L (ref 0–53)
AST: 24 U/L (ref 0–37)
Albumin: 4.2 g/dL (ref 3.5–5.2)
Alkaline Phosphatase: 65 U/L (ref 39–117)
BUN: 12 mg/dL (ref 6–23)
CO2: 26 meq/L (ref 19–32)
Calcium: 9.2 mg/dL (ref 8.4–10.5)
Chloride: 104 meq/L (ref 96–112)
Creatinine, Ser: 0.94 mg/dL (ref 0.40–1.50)
GFR: 103.09 mL/min (ref >60.00)
Glucose, Bld: 245 mg/dL — ABNORMAL HIGH (ref 70–99)
Potassium: 4.6 meq/L (ref 3.5–5.1)
Sodium: 137 meq/L (ref 135–145)
Total Bilirubin: 1.3 mg/dL — ABNORMAL HIGH (ref 0.2–1.2)
Total Protein: 6.5 g/dL (ref 6.0–8.3)

## 2024-04-09 LAB — LIPID PANEL
Cholesterol: 113 mg/dL (ref 0–200)
HDL: 36.2 mg/dL — ABNORMAL LOW (ref >39.00)
LDL Cholesterol: 54 mg/dL (ref 0–99)
NonHDL: 76.5
Total CHOL/HDL Ratio: 3
Triglycerides: 111 mg/dL (ref 0.0–149.0)
VLDL: 22.2 mg/dL (ref 0.0–40.0)

## 2024-04-09 MED ORDER — ATORVASTATIN CALCIUM 40 MG PO TABS
40.0000 mg | ORAL_TABLET | Freq: Every day | ORAL | 1 refills | Status: DC
  Filled 2024-04-09 – 2024-05-06 (×3): qty 90, 90d supply, fill #0

## 2024-04-09 MED ORDER — TRESIBA FLEXTOUCH 100 UNIT/ML ~~LOC~~ SOPN
40.0000 [IU] | PEN_INJECTOR | Freq: Every day | SUBCUTANEOUS | 1 refills | Status: DC
Start: 2024-04-09 — End: 2024-04-13
  Filled 2024-04-09: qty 9, 22d supply, fill #0

## 2024-04-09 MED ORDER — FREESTYLE LIBRE 3 PLUS SENSOR MISC
5 refills | Status: DC
  Filled 2024-04-09 – 2024-05-03 (×2): qty 2, 30d supply, fill #0
  Filled 2024-05-19 – 2024-05-26 (×2): qty 2, 30d supply, fill #1

## 2024-04-09 MED ORDER — FENOFIBRATE 160 MG PO TABS
160.0000 mg | ORAL_TABLET | Freq: Every day | ORAL | 1 refills | Status: DC
  Filled 2024-04-09 – 2024-05-03 (×2): qty 90, 90d supply, fill #0

## 2024-04-09 NOTE — Progress Notes (Signed)
 Established Patient Office Visit     CC/Reason for Visit: Hospital follow-up  HPI: George Howe is a 38 y.o. male who is coming in today for the above mentioned reasons. Past Medical History is significant for: Uncontrolled insulin -dependent diabetes with prior histories of pancreatitis.  He presented to the hospital due to abdominal pain.  He was found to have acute pancreatitis with a lipase of 774 with elevated LFTs.  He was also found to have very elevated triglycerides of 1775.  Since returning back home he is back to baseline.  He is taking atorvastatin  and fenofibrate .  He will be calling endocrinology to schedule his Endo follow-up.  He has an appointment with GI in September.   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 He has a 18.5 pack-year smoking history. He has been exposed to tobacco smoke. He has never used smokeless tobacco. He reports that he does not currently use alcohol. He reports current drug use. Drug: Marijuana.  Allergies: No Known Allergies  Family History:  Family History  Problem Relation Age of Onset   Diabetes Father    Diabetes Paternal Grandmother      Current Outpatient Medications:    buPROPion  (WELLBUTRIN  XL) 150 MG 24 hr tablet, Take 1 tablet (150 mg total) by mouth daily., Disp: 90 tablet, Rfl: 1   cetirizine  (ZYRTEC ) 10 MG tablet, Take 1 tablet (10 mg total) by mouth daily as needed for allergies., Disp: 90 tablet, Rfl: 0   Continuous Glucose Sensor (FREESTYLE LIBRE 3 PLUS SENSOR) MISC, Change sensor every 15 days., Disp: 2 each, Rfl: 5   Glucagon  1 MG/0.2ML SOAJ, 1 mg (1 mL) injected subcutaneously or intramuscularly into the upper arm, thigh, or buttocks, or intravenously for hypoglycemia, Disp: 0.2 mL, Rfl: 2   insulin  lispro (HUMALOG  KWIKPEN) 100 UNIT/ML KwikPen, Max daily 45 units daily, Disp: 45 mL, Rfl: 3   Insulin  Pen Needle (PEN NEEDLES) 32G X 4 MM MISC, Use in the morning, at noon, in the evening, and at bedtime, Disp: 400 each, Rfl: 3   metFORMIN  (GLUCOPHAGE -XR) 500 MG 24 hr tablet, Take 1 tablet (500 mg total) by mouth  daily with breakfast., Disp: 180 tablet, Rfl: 3   Multiple Vitamins-Minerals (MULTIVITAMIN WITH MINERALS) tablet, Take 1 tablet by mouth daily., Disp: , Rfl:    nicotine  (NICODERM CQ  - DOSED IN MG/24 HOURS) 14 mg/24hr patch, Place 1 patch (14 mg total) onto the skin daily., Disp: 28 patch, Rfl: 0   atorvastatin  (LIPITOR) 40 MG tablet, Take 1 tablet (40 mg total) by mouth daily., Disp: 90 tablet, Rfl: 1   fenofibrate  160 MG tablet, Take 1 tablet (160 mg total) by mouth daily., Disp: 90 tablet, Rfl: 1   insulin  degludec (TRESIBA  FLEXTOUCH) 100 UNIT/ML FlexTouch Pen, Inject 40 Units into the skin daily., Disp: 9 mL,  Rfl: 1  Review of Systems:  Negative unless indicated in HPI.   Physical Exam: Vitals:   04/09/24 0933  BP: 110/80  Pulse: 83  Temp: 98.8 F (37.1 C)  TempSrc: Oral  SpO2: 99%  Weight: 192 lb (87.1 kg)    Body mass index is 34.01 kg/m.   Physical Exam Vitals reviewed.  Constitutional:      Appearance: Normal appearance. He is obese.  HENT:     Head: Normocephalic and atraumatic.  Eyes:     Conjunctiva/sclera: Conjunctivae normal.  Cardiovascular:     Rate and Rhythm: Normal rate and regular rhythm.  Pulmonary:     Effort: Pulmonary effort is normal.     Breath sounds: Normal breath sounds.  Skin:    General: Skin is warm and dry.  Neurological:     General: No focal deficit present.     Mental Status: He is alert and oriented to person, place, and time.  Psychiatric:        Mood and Affect: Mood normal.        Behavior: Behavior normal.        Thought Content: Thought content normal.        Judgment: Judgment normal.      Impression and Plan:  Hospital discharge follow-up  Hypertriglyceridemia -     Atorvastatin  Calcium ; Take 1 tablet (40 mg total) by mouth daily.  Dispense: 90 tablet; Refill: 1 -     Fenofibrate ; Take 1 tablet (160 mg total) by mouth daily.  Dispense: 90 tablet; Refill: 1 -     Comprehensive metabolic panel with GFR; Future -     Lipid panel; Future  Insulin  dependent type 2 diabetes mellitus (HCC) -     Tresiba  FlexTouch; Inject 40 Units into the skin daily.  Dispense: 9 mL; Refill: 1 -     FreeStyle Libre 3 Plus Sensor; Change sensor every 15 days.  Dispense: 2 each; Refill: 5   - Hospital charts reviewed in detail. - Check labs today as requested by discharging physician. - He remains compliant with his atorvastatin  and fenofibrate . - Has follow-up scheduled with GI in September, have advised him to call endocrinology to schedule follow-up as well.  His A1c in the hospital was very uncontrolled at 11.4.  Time spent:30 minutes  reviewing chart, interviewing and examining patient and formulating plan of care.     Tully Theophilus Andrews, MD [facility]

## 2024-04-13 ENCOUNTER — Encounter: Payer: Self-pay | Admitting: Internal Medicine

## 2024-04-13 ENCOUNTER — Other Ambulatory Visit: Payer: Self-pay

## 2024-04-13 ENCOUNTER — Other Ambulatory Visit (HOSPITAL_BASED_OUTPATIENT_CLINIC_OR_DEPARTMENT_OTHER): Payer: Self-pay

## 2024-04-13 ENCOUNTER — Ambulatory Visit: Payer: Self-pay | Admitting: Internal Medicine

## 2024-04-13 ENCOUNTER — Ambulatory Visit: Admitting: Internal Medicine

## 2024-04-13 VITALS — BP 126/72 | HR 86 | Ht 63.0 in | Wt 192.6 lb

## 2024-04-13 DIAGNOSIS — Z794 Long term (current) use of insulin: Secondary | ICD-10-CM

## 2024-04-13 DIAGNOSIS — E119 Type 2 diabetes mellitus without complications: Secondary | ICD-10-CM | POA: Diagnosis not present

## 2024-04-13 DIAGNOSIS — Z7984 Long term (current) use of oral hypoglycemic drugs: Secondary | ICD-10-CM

## 2024-04-13 MED ORDER — PEN NEEDLES 32G X 4 MM MISC
1.0000 | Freq: Four times a day (QID) | 3 refills | Status: AC
  Filled 2024-04-13: qty 300, 90d supply, fill #0
  Filled 2024-05-03: qty 400, 100d supply, fill #0
  Filled 2024-08-10: qty 400, 100d supply, fill #1

## 2024-04-13 MED ORDER — METFORMIN HCL ER 500 MG PO TB24
1000.0000 mg | ORAL_TABLET | Freq: Every day | ORAL | 3 refills | Status: DC
  Filled 2024-04-13: qty 180, 90d supply, fill #0

## 2024-04-13 MED ORDER — TRESIBA FLEXTOUCH 100 UNIT/ML ~~LOC~~ SOPN
40.0000 [IU] | PEN_INJECTOR | Freq: Every day | SUBCUTANEOUS | 2 refills | Status: AC
  Filled 2024-04-13: qty 36, 90d supply, fill #0
  Filled 2024-07-11: qty 36, 90d supply, fill #1

## 2024-04-13 MED ORDER — INSULIN LISPRO (1 UNIT DIAL) 100 UNIT/ML (KWIKPEN)
60.0000 [IU] | PEN_INJECTOR | Freq: Every day | SUBCUTANEOUS | 2 refills | Status: AC
  Filled 2024-04-13: qty 60, fill #0
  Filled 2024-05-06: qty 60, 100d supply, fill #0
  Filled 2024-06-29: qty 54, 90d supply, fill #0

## 2024-04-13 MED ORDER — METFORMIN HCL ER 500 MG PO TB24
1000.0000 mg | ORAL_TABLET | Freq: Every day | ORAL | 3 refills | Status: AC
  Filled 2024-04-13: qty 180, 90d supply, fill #0
  Filled 2024-08-10: qty 180, 90d supply, fill #1

## 2024-04-13 NOTE — Progress Notes (Signed)
 Name: George Howe  MRN/ DOB: [id number], 1985/10/03   Age/ Sex: 38 y.o., male    PCP: Theophilus Andrews, Tully GRADE, MD   Reason for Endocrinology Evaluation: Type 2 Diabetes Mellitus     Date of Initial Endocrinology Visit: 07/31/2023    PATIENT IDENTIFIER: [name] is a 38 y.o. male with a past medical history of DM, pancreatitis. The patient presented for initial endocrinology clinic visit on 07/31/2023 for consultative assistance with his diabetes management.    HPI: [name] was    Diagnosed with DM 2021 Prior Medications tried/Intolerance: just insulin   Currently checking blood sugars multiple x / day Hypoglycemia episodes : 0                Hemoglobin A1c has ranged from 6.5% in 2022, peaking at 12.1% in 2022. Patient with history of DKA in 2022  He is s/p cholecystectomy 07/16/2023   Takes Novolog  an hour after the meal  On his initial visit to our clinic he had an A1c of 9.3%, he was on basal/prandial insulin , he was taking NovoLog  an hour after the meal.  I adjusted his insulin  regimen and started him on metformin     Was on lipid lowering agents but these were discontinued due to elevated LFT's , Follows with heartcare    Patient was hospitalized 03/2024 with acute pancreatitis due to hypertriglyceridemia (Tg 1,749 mg/dL ), this is recurrent in nature, with history of pancreatitis due to gallstones in 2024.  SUBJECTIVE:   During the last visit (07/31/2023): A1c 9.3%     Today (04/13/24) George Howe is here for follow-up on diabetes management.  He checks blood sugars multiple times daily. The patient has not hypoglycemic episodes.   He has been diagnosed with recurrent pancreatitis  No recent abdominal pain , nor nausea or vomiting  No constipation or diarrhea    HOME DIABETES REGIMEN: Metformin  500 mg X, BID  Tresiba  40 units daily - has been taking 30 units  Humalog  6 units TIDQAC  CF: Humalog  (BG -130/30) TODQAC   Statin:  Yes ACE-I/ARB: no  CONTINUOUS GLUCOSE MONITORING RECORD INTERPRETATION    Dates of Recording: 7/8-7/21/2025  Sensor description:freestyle libre 3  Results statistics:   CGM use % of time 42  Average and SD 268/23.9  Time in range 6  %  % Time Above 180 39  % Time above 250 55  % Time Below target 0   Glycemic patterns summary: Hyperglycemia noted throughout the day and night  Hyperglycemic episodes all day and night  Hypoglycemic episodes occurred N/A  Overnight periods: High     DIABETIC COMPLICATIONS: Microvascular complications:  Denies: CKD , neuropathy  Last eye exam: Completed   Macrovascular complications:   Denies: CAD, PVD, CVA   PAST HISTORY: Past Medical History:  Past Medical History:  Diagnosis Date   Abnormal CT of the abdomen    Abscess of left axilla    Acute pancreatitis    Anxiety    Bilateral carpal tunnel syndrome    Calculus of gallbladder without cholecystitis without obstruction    Chicken pox    Cholelithiasis    Cigarette nicotine  dependence without complication    Depression    Diabetes mellitus without complication (HCC)    Type 2   Dizziness    DM (diabetes mellitus) (HCC)    Elevated liver function tests    Gall stones    Hay fever    Hyperlipidemia    Hypertriglyceridemia    Morbid  obesity (HCC)    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   Pancreatic necrosis    Pancreatitis    PONV (postoperative nausea and vomiting)    Past Surgical History:  Past Surgical History:  Procedure Laterality Date   CARPAL TUNNEL RELEASE Right 10/18/2021   Procedure: Right CARPAL TUNNEL RELEASE;  Surgeon: Romona Harari, MD;  Location: [facility];  Service: Orthopedics;  Laterality: Right;   CARPAL TUNNEL RELEASE Left 11/01/2021   Procedure: Left CARPAL TUNNEL RELEASE;  Surgeon: Romona Harari, MD;  Location: [facility];  Service: Orthopedics;  Laterality: Left;   REFRACTIVE SURGERY  1995    Social History:  reports  that he has been smoking cigarettes. He has a 18.5 pack-year smoking history. He has been exposed to tobacco smoke. He has never used smokeless tobacco. He reports that he does not currently use alcohol. He reports current drug use. Drug: Marijuana. Family History:  Family History  Problem Relation Age of Onset   Diabetes Father    Diabetes Paternal Grandmother      HOME MEDICATIONS: Allergies as of 04/13/2024   No Known Allergies      Medication List        Accurate as of April 13, 2024  8:46 AM. If you have any questions, ask your nurse or doctor.          atorvastatin  40 MG tablet Commonly known as: LIPITOR Take 1 tablet (40 mg total) by mouth daily.   buPROPion  150 MG 24 hr tablet Commonly known as: Wellbutrin  XL Take 1 tablet (150 mg total) by mouth daily.   cetirizine  10 MG tablet Commonly known as: ZYRTEC  Take 1 tablet (10 mg total) by mouth daily as needed for allergies.   fenofibrate  160 MG tablet Take 1 tablet (160 mg total) by mouth daily.   FreeStyle Libre 3 Plus Sensor Misc Change sensor every 15 days.   Gvoke HypoPen  1-Pack 1 MG/0.2ML Soaj Generic drug: Glucagon  1 mg (1 mL) injected subcutaneously or intramuscularly into the upper arm, thigh, or buttocks, or intravenously for hypoglycemia   insulin  lispro 100 UNIT/ML KwikPen Commonly known as: HumaLOG  KwikPen Max daily 45 units daily   metFORMIN  500 MG 24 hr tablet Commonly known as: GLUCOPHAGE -XR Take 1 tablet (500 mg total) by mouth daily with breakfast.   multivitamin with minerals tablet Take 1 tablet by mouth daily.   nicotine  14 mg/24hr patch Commonly known as: NICODERM CQ  - dosed in mg/24 hours Place 1 patch (14 mg total) onto the skin daily.   TechLite Pen Needles 32G X 4 MM Misc Generic drug: Insulin  Pen Needle Use in the morning, at noon, in the evening, and at bedtime   Tresiba  FlexTouch 100 UNIT/ML FlexTouch Pen Generic drug: insulin  degludec Inject 40 Units into the skin  daily.         ALLERGIES: No Known Allergies   REVIEW OF SYSTEMS: A comprehensive ROS was conducted with the patient and is negative except as per HPI    OBJECTIVE:   VITAL SIGNS: BP 126/72 (BP Location: Left Arm, Patient Position: Sitting, Cuff Size: Normal)   Pulse 86   Ht 5' 3 (1.6 m)   Wt 192 lb 9.6 oz (87.4 kg)   SpO2 99%   BMI 34.12 kg/m    PHYSICAL EXAM:  General: Pt appears well and is in NAD  Neck: General: Supple without adenopathy or carotid bruits. Thyroid: Thyroid size normal.  No goiter or nodules appreciated.   Lungs: Clear with good BS bilat  Heart: RRR   Abdomen:  soft, nontender  Extremities:  Lower extremities - No pretibial edema.   Neuro: MS is good with appropriate affect, pt is alert and Ox3    DM foot exam: 07/31/2023  The skin of the feet is intact without sores or ulcerations. The pedal pulses are 2+ on right and 2+ on left. The sensation is intact to a screening 5.07, 10 gram monofilament bilaterally   DATA REVIEWED:  Lab Results  Component Value Date   HGBA1C 11.4 (H) 03/30/2024   HGBA1C 9.3 (A) 07/31/2023   HGBA1C 8.0 (A) 04/02/2023    Latest Reference Range & Units 04/09/24 10:07  Sodium 135 - 145 mEq/L 137  Potassium 3.5 - 5.1 mEq/L 4.6  Chloride 96 - 112 mEq/L 104  CO2 19 - 32 mEq/L 26  Glucose 70 - 99 mg/dL 754 (H)  BUN 6 - 23 mg/dL 12  Creatinine 9.59 - 8.49 mg/dL 9.05  Calcium  8.4 - 10.5 mg/dL 9.2  Alkaline Phosphatase 39 - 117 U/L 65  Albumin 3.5 - 5.2 g/dL 4.2  AST 0 - 37 U/L 24  ALT 0 - 53 U/L 42  Total Protein 6.0 - 8.3 g/dL 6.5  Total Bilirubin 0.2 - 1.2 mg/dL 1.3 (H)  GFR >39.99 mL/min 103.09    ASSESSMENT / PLAN / RECOMMENDATIONS:   1) Type 2 Diabetes Mellitus, Poorly controlled, Without complications - Most recent A1c of 11.4%. Goal A1c < 7.0 %.    -Patient has been noted worsening glycemic control - He assures me compliance with diet and medications -He has been on suboptimal basal insulin  dose  until recently, will not change the dose at this time - Not a candidate for SGLT2 inhibitors due to history of DKA 2022 - Not a candidate for GLP-1 agonist due to history of recurrent pancreatitis - Will increase prandial insulin  and change his sensitivity factor from 30 to 25  MEDICATIONS:  Metformin  500 mg XR, 2 tabs  daily Tresiba  40 units daily Humalog  10 units with each meal CF: Humalog  (BG -130/25) TIDQAC  EDUCATION / INSTRUCTIONS: BG monitoring instructions: Patient is instructed to check his blood sugars 3 times a day. Call [place] [facility] if: BG persistently < 70  I reviewed the Rule of 15 for the treatment of hypoglycemia in detail with the patient. Literature supplied.   2) Diabetic complications:  Eye: Does not have known diabetic retinopathy.  Neuro/ Feet: Does not  have known diabetic peripheral neuropathy. Renal: Patient does not have known baseline CKD. He is not on an ACEI/ARB at present.  3) Hypertriglyceridemia:   -Per cardiology  Follow-up in 3 months    Signed electronically by: Stefano Redgie Butts, MD  [facility]  [facility] [address] [place] [place], [place] [address] Phone: [phone] FAX: [phone]   CC: Theophilus Andrews, Tully GRADE, MD [address] [place] [address] Phone: [phone]  Fax: [phone]    Return to Endocrinology clinic as below: Future Appointments  Date Time Provider Department Center  04/13/2024  8:50 AM Harris Penton, Donell Redgie, MD [facility] None  05/27/2024  8:20 AM [place], PA-C [facility]  08/10/2024  9:30 AM Theophilus Andrews, Tully GRADE, MD [facility]

## 2024-04-13 NOTE — Patient Instructions (Addendum)
 Metformin  500 mg, 2 tablets daily  Tresiba   40 units once daily  Humalog  10 units with each meal  Humalog  correctional insulin : ADD extra units on insulin  to your meal-time Novolog   dose if your blood sugars are higher than 155. Use the scale below to help guide you:   Blood sugar before meal Number of units to inject  Less than 155 0 unit  156- 180 1 units  181 - 205 2 units  206 - 230 3 units  231 - 255 4 units  256 - 280 5 units  281 - 305 6 units  306 - 330 7 units  331 - 355 8 units  356 - 380 9 units      HOW TO TREAT LOW BLOOD SUGARS (Blood sugar LESS THAN 70 MG/DL) Please follow the RULE OF 15 for the treatment of hypoglycemia treatment (when your (blood sugars are less than 70 mg/dL)   STEP 1: Take 15 grams of carbohydrates when your blood sugar is low, which includes:  3-4 GLUCOSE TABS  OR 3-4 OZ OF JUICE OR REGULAR SODA OR ONE TUBE OF GLUCOSE GEL    STEP 2: RECHECK blood sugar in 15 MINUTES STEP 3: If your blood sugar is still low at the 15 minute recheck --> then, go back to STEP 1 and treat AGAIN with another 15 grams of carbohydrates.

## 2024-04-21 ENCOUNTER — Other Ambulatory Visit (HOSPITAL_COMMUNITY): Payer: Self-pay

## 2024-04-24 ENCOUNTER — Other Ambulatory Visit (HOSPITAL_BASED_OUTPATIENT_CLINIC_OR_DEPARTMENT_OTHER): Payer: Self-pay

## 2024-05-04 ENCOUNTER — Other Ambulatory Visit (HOSPITAL_BASED_OUTPATIENT_CLINIC_OR_DEPARTMENT_OTHER): Payer: Self-pay

## 2024-05-05 ENCOUNTER — Other Ambulatory Visit (HOSPITAL_BASED_OUTPATIENT_CLINIC_OR_DEPARTMENT_OTHER): Payer: Self-pay

## 2024-05-06 ENCOUNTER — Other Ambulatory Visit (HOSPITAL_BASED_OUTPATIENT_CLINIC_OR_DEPARTMENT_OTHER): Payer: Self-pay

## 2024-05-07 ENCOUNTER — Other Ambulatory Visit: Payer: Self-pay

## 2024-05-12 ENCOUNTER — Inpatient Hospital Stay (HOSPITAL_COMMUNITY)
Admission: EM | Admit: 2024-05-12 | Discharge: 2024-05-18 | DRG: 439 | Disposition: A | Discharge status: Home / Self Care | Attending: Internal Medicine | Admitting: Internal Medicine

## 2024-05-12 ENCOUNTER — Encounter (HOSPITAL_COMMUNITY): Payer: Self-pay

## 2024-05-12 ENCOUNTER — Other Ambulatory Visit: Payer: Self-pay

## 2024-05-12 DIAGNOSIS — K859 Acute pancreatitis without necrosis or infection, unspecified: Secondary | ICD-10-CM | POA: Diagnosis not present

## 2024-05-12 DIAGNOSIS — Z79899 Other long term (current) drug therapy: Secondary | ICD-10-CM | POA: Diagnosis not present

## 2024-05-12 DIAGNOSIS — Z794 Long term (current) use of insulin: Secondary | ICD-10-CM

## 2024-05-12 DIAGNOSIS — E119 Type 2 diabetes mellitus without complications: Secondary | ICD-10-CM | POA: Diagnosis present

## 2024-05-12 DIAGNOSIS — R1012 Left upper quadrant pain: Secondary | ICD-10-CM | POA: Diagnosis not present

## 2024-05-12 DIAGNOSIS — Z833 Family history of diabetes mellitus: Secondary | ICD-10-CM

## 2024-05-12 DIAGNOSIS — E46 Unspecified protein-calorie malnutrition: Secondary | ICD-10-CM | POA: Diagnosis not present

## 2024-05-12 DIAGNOSIS — Z5986 Financial insecurity: Secondary | ICD-10-CM

## 2024-05-12 DIAGNOSIS — F411 Generalized anxiety disorder: Secondary | ICD-10-CM | POA: Diagnosis present

## 2024-05-12 DIAGNOSIS — Z7984 Long term (current) use of oral hypoglycemic drugs: Secondary | ICD-10-CM

## 2024-05-12 DIAGNOSIS — Z6835 Body mass index (BMI) 35.0-35.9, adult: Secondary | ICD-10-CM

## 2024-05-12 DIAGNOSIS — K863 Pseudocyst of pancreas: Secondary | ICD-10-CM | POA: Diagnosis not present

## 2024-05-12 DIAGNOSIS — R1084 Generalized abdominal pain: Secondary | ICD-10-CM

## 2024-05-12 DIAGNOSIS — R030 Elevated blood-pressure reading, without diagnosis of hypertension: Secondary | ICD-10-CM | POA: Diagnosis present

## 2024-05-12 DIAGNOSIS — K760 Fatty (change of) liver, not elsewhere classified: Secondary | ICD-10-CM | POA: Diagnosis present

## 2024-05-12 DIAGNOSIS — R109 Unspecified abdominal pain: Secondary | ICD-10-CM | POA: Diagnosis present

## 2024-05-12 DIAGNOSIS — K858 Other acute pancreatitis without necrosis or infection: Principal | ICD-10-CM | POA: Diagnosis present

## 2024-05-12 DIAGNOSIS — E781 Pure hyperglyceridemia: Secondary | ICD-10-CM | POA: Diagnosis present

## 2024-05-12 DIAGNOSIS — Z9049 Acquired absence of other specified parts of digestive tract: Secondary | ICD-10-CM | POA: Diagnosis not present

## 2024-05-12 DIAGNOSIS — K8500 Idiopathic acute pancreatitis without necrosis or infection: Secondary | ICD-10-CM | POA: Diagnosis not present

## 2024-05-12 DIAGNOSIS — F17210 Nicotine dependence, cigarettes, uncomplicated: Secondary | ICD-10-CM | POA: Diagnosis present

## 2024-05-12 DIAGNOSIS — E440 Moderate protein-calorie malnutrition: Secondary | ICD-10-CM | POA: Diagnosis present

## 2024-05-12 LAB — CBC WITH DIFFERENTIAL/PLATELET
Abs Immature Granulocytes: 0.02 K/uL (ref 0.00–0.07)
Basophils Absolute: 0 K/uL (ref 0.0–0.1)
Basophils Relative: 0 %
Eosinophils Absolute: 0.1 K/uL (ref 0.0–0.5)
Eosinophils Relative: 1 %
HCT: 49.8 % (ref 39.0–52.0)
Hemoglobin: 17.6 g/dL — ABNORMAL HIGH (ref 13.0–17.0)
Immature Granulocytes: 0 %
Lymphocytes Relative: 19 %
Lymphs Abs: 2 K/uL (ref 0.7–4.0)
MCH: 28.4 pg (ref 26.0–34.0)
MCHC: 35.3 g/dL (ref 30.0–36.0)
MCV: 80.5 fL (ref 80.0–100.0)
Monocytes Absolute: 0.5 K/uL (ref 0.1–1.0)
Monocytes Relative: 5 %
Neutro Abs: 7.6 K/uL (ref 1.7–7.7)
Neutrophils Relative %: 75 %
Platelets: 287 K/uL (ref 150–400)
RBC: 6.19 MIL/uL — ABNORMAL HIGH (ref 4.22–5.81)
RDW: 12.3 % (ref 11.5–15.5)
WBC: 10.2 K/uL (ref 4.0–10.5)
nRBC: 0 % (ref 0.0–0.2)

## 2024-05-12 LAB — COMPREHENSIVE METABOLIC PANEL WITH GFR
ALT: 47 U/L — ABNORMAL HIGH (ref 0–44)
AST: 53 U/L — ABNORMAL HIGH (ref 15–41)
Albumin: 4.4 g/dL (ref 3.5–5.0)
Alkaline Phosphatase: 85 U/L (ref 38–126)
Anion gap: 9 (ref 5–15)
BUN: 22 mg/dL — ABNORMAL HIGH (ref 6–20)
CO2: 24 mmol/L (ref 22–32)
Calcium: 9.1 mg/dL (ref 8.9–10.3)
Chloride: 100 mmol/L (ref 98–111)
Creatinine, Ser: 0.98 mg/dL (ref 0.61–1.24)
GFR, Estimated: >60 mL/min (ref >60)
Glucose, Bld: 185 mg/dL — ABNORMAL HIGH (ref 70–99)
Potassium: 4.9 mmol/L (ref 3.5–5.1)
Sodium: 133 mmol/L — ABNORMAL LOW (ref 135–145)
Total Bilirubin: 2.2 mg/dL — ABNORMAL HIGH (ref 0.0–1.2)
Total Protein: 6.9 g/dL (ref 6.5–8.1)

## 2024-05-12 LAB — CBC
HCT: 45.1 % (ref 39.0–52.0)
Hemoglobin: 15.8 g/dL (ref 13.0–17.0)
MCH: 28.3 pg (ref 26.0–34.0)
MCHC: 35 g/dL (ref 30.0–36.0)
MCV: 80.7 fL (ref 80.0–100.0)
Platelets: 238 K/uL (ref 150–400)
RBC: 5.59 MIL/uL (ref 4.22–5.81)
RDW: 12.7 % (ref 11.5–15.5)
WBC: 11.2 K/uL — ABNORMAL HIGH (ref 4.0–10.5)
nRBC: 0 % (ref 0.0–0.2)

## 2024-05-12 LAB — CBG MONITORING, ED
Glucose-Capillary: 165 mg/dL — ABNORMAL HIGH (ref 70–99)
Glucose-Capillary: 192 mg/dL — ABNORMAL HIGH (ref 70–99)
Glucose-Capillary: 229 mg/dL — ABNORMAL HIGH (ref 70–99)

## 2024-05-12 LAB — CREATININE, SERUM
Creatinine, Ser: 0.89 mg/dL (ref 0.61–1.24)
GFR, Estimated: >60 mL/min (ref >60)

## 2024-05-12 LAB — LIPASE, BLOOD: Lipase: 123 U/L — ABNORMAL HIGH (ref 11–51)

## 2024-05-12 LAB — TRIGLYCERIDES: Triglycerides: 2117 mg/dL — ABNORMAL HIGH (ref <150)

## 2024-05-12 LAB — GLUCOSE, CAPILLARY
Glucose-Capillary: 190 mg/dL — ABNORMAL HIGH (ref 70–99)
Glucose-Capillary: 164 mg/dL — ABNORMAL HIGH (ref 70–99)

## 2024-05-12 MED ORDER — HYDROMORPHONE HCL 1 MG/ML IJ SOLN
0.5000 mg | INTRAMUSCULAR | Status: DC | PRN
  Administered 2024-05-12 – 2024-05-14 (×9): 1 mg via INTRAVENOUS
  Administered 2024-05-15: 0.5 mg via INTRAVENOUS
  Administered 2024-05-15 – 2024-05-16 (×5): 1 mg via INTRAVENOUS
  Administered 2024-05-16: 0.5 mg via INTRAVENOUS
  Administered 2024-05-16: 1 mg via INTRAVENOUS
  Administered 2024-05-17: 0.5 mg via INTRAVENOUS
  Administered 2024-05-18: 1 mg via INTRAVENOUS
  Filled 2024-05-12 (×18): qty 1

## 2024-05-12 MED ORDER — ONDANSETRON HCL 4 MG PO TABS
4.0000 mg | ORAL_TABLET | Freq: Four times a day (QID) | ORAL | Status: DC | PRN
  Administered 2024-05-14: 4 mg via ORAL
  Filled 2024-05-12: qty 1

## 2024-05-12 MED ORDER — ATORVASTATIN CALCIUM 20 MG PO TABS
40.0000 mg | ORAL_TABLET | Freq: Every day | ORAL | Status: DC
  Administered 2024-05-12 – 2024-05-18 (×7): 40 mg via ORAL
  Filled 2024-05-12: qty 2
  Filled 2024-05-12: qty 1
  Filled 2024-05-12 (×2): qty 2
  Filled 2024-05-12 (×2): qty 1
  Filled 2024-05-12: qty 2

## 2024-05-12 MED ORDER — KETOROLAC TROMETHAMINE 30 MG/ML IJ SOLN
30.0000 mg | Freq: Once | INTRAMUSCULAR | Status: AC
  Administered 2024-05-12: 30 mg via INTRAVENOUS
  Filled 2024-05-12: qty 1

## 2024-05-12 MED ORDER — ONDANSETRON HCL 4 MG/2ML IJ SOLN
4.0000 mg | Freq: Four times a day (QID) | INTRAMUSCULAR | Status: DC | PRN
Start: 2024-05-12 — End: 2024-05-18
  Administered 2024-05-12 – 2024-05-15 (×4): 4 mg via INTRAVENOUS
  Filled 2024-05-12 (×5): qty 2

## 2024-05-12 MED ORDER — DEXTROSE IN LACTATED RINGERS 5 % IV SOLN: INTRAVENOUS | Status: DC

## 2024-05-12 MED ORDER — TRAZODONE HCL 50 MG PO TABS
50.0000 mg | ORAL_TABLET | Freq: Every evening | ORAL | Status: DC | PRN
  Administered 2024-05-13 – 2024-05-17 (×6): 50 mg via ORAL
  Filled 2024-05-12 (×6): qty 1

## 2024-05-12 MED ORDER — DEXTROSE 5 % IV SOLN: INTRAVENOUS | Status: DC

## 2024-05-12 MED ORDER — BUPROPION HCL ER (XL) 150 MG PO TB24
150.0000 mg | ORAL_TABLET | Freq: Every day | ORAL | Status: DC
  Administered 2024-05-13 – 2024-05-18 (×6): 150 mg via ORAL
  Filled 2024-05-12 (×6): qty 1

## 2024-05-12 MED ORDER — ACETAMINOPHEN 325 MG PO TABS: 650.0000 mg | ORAL_TABLET | Freq: Four times a day (QID) | ORAL | Status: DC | PRN

## 2024-05-12 MED ORDER — OXYCODONE-ACETAMINOPHEN 5-325 MG PO TABS
1.0000 | ORAL_TABLET | Freq: Four times a day (QID) | ORAL | Status: DC | PRN
  Administered 2024-05-12 – 2024-05-17 (×17): 1 via ORAL
  Filled 2024-05-12 (×17): qty 1

## 2024-05-12 MED ORDER — ENOXAPARIN SODIUM 40 MG/0.4ML IJ SOSY
40.0000 mg | PREFILLED_SYRINGE | INTRAMUSCULAR | Status: DC
  Administered 2024-05-12 – 2024-05-13 (×2): 40 mg via SUBCUTANEOUS
  Filled 2024-05-12 (×2): qty 0.4

## 2024-05-12 MED ORDER — HYDROMORPHONE HCL 1 MG/ML IJ SOLN
1.0000 mg | Freq: Once | INTRAMUSCULAR | Status: AC
  Administered 2024-05-12: 1 mg via INTRAVENOUS
  Filled 2024-05-12: qty 1

## 2024-05-12 MED ORDER — SODIUM CHLORIDE 0.9 % IV BOLUS
1000.0000 mL | Freq: Once | INTRAVENOUS | Status: AC
  Administered 2024-05-12: 1000 mL via INTRAVENOUS

## 2024-05-12 MED ORDER — HYDRALAZINE HCL 20 MG/ML IJ SOLN
10.0000 mg | Freq: Four times a day (QID) | INTRAMUSCULAR | Status: DC | PRN
Start: 2024-05-12 — End: 2024-05-18

## 2024-05-12 MED ORDER — INSULIN (MYXREDLIN) INFUSION FOR HYPERTRIGLYCERIDEMIA
0.1000 [IU]/kg/h | INTRAVENOUS | Status: DC
  Administered 2024-05-12 – 2024-05-13 (×3): 0.1 [IU]/kg/h via INTRAVENOUS
  Filled 2024-05-12 (×3): qty 100

## 2024-05-12 MED ORDER — FENOFIBRATE 160 MG PO TABS
160.0000 mg | ORAL_TABLET | Freq: Every day | ORAL | Status: DC
  Administered 2024-05-12 – 2024-05-18 (×7): 160 mg via ORAL
  Filled 2024-05-12 (×8): qty 1

## 2024-05-12 MED ORDER — ONDANSETRON HCL 4 MG/2ML IJ SOLN
4.0000 mg | Freq: Once | INTRAMUSCULAR | Status: AC
  Administered 2024-05-12: 4 mg via INTRAVENOUS
  Filled 2024-05-12: qty 2

## 2024-05-12 MED ORDER — LORATADINE 10 MG PO TABS
10.0000 mg | ORAL_TABLET | Freq: Every day | ORAL | Status: DC
  Administered 2024-05-14 – 2024-05-18 (×5): 10 mg via ORAL
  Filled 2024-05-12 (×6): qty 1

## 2024-05-12 MED ORDER — NICOTINE 14 MG/24HR TD PT24
14.0000 mg | MEDICATED_PATCH | Freq: Every day | TRANSDERMAL | Status: DC
  Administered 2024-05-12 – 2024-05-18 (×7): 14 mg via TRANSDERMAL
  Filled 2024-05-12 (×7): qty 1

## 2024-05-12 MED ORDER — CHLORHEXIDINE GLUCONATE CLOTH 2 % EX PADS
6.0000 | MEDICATED_PAD | Freq: Every day | CUTANEOUS | Status: DC
  Administered 2024-05-13 – 2024-05-15 (×2): 6 via TOPICAL

## 2024-05-12 MED ORDER — IOHEXOL 9 MG/ML PO SOLN
1000.0000 mL | ORAL | Status: AC
  Administered 2024-05-12: 300 mL via ORAL

## 2024-05-12 MED ORDER — ACETAMINOPHEN 650 MG RE SUPP: 650.0000 mg | Freq: Four times a day (QID) | RECTAL | Status: DC | PRN

## 2024-05-12 NOTE — ED Notes (Signed)
 Spoke with Dr Cottie and pt can have something to drink at this time and he will be alternating PO pain meds with IV pain meds

## 2024-05-12 NOTE — H&P (Signed)
 History and Physical    Patient: George Howe FMW:[id number] DOB: 1986-04-03 DOA: 05/12/2024 DOS: the patient was seen and examined on 05/12/2024 PCP: Theophilus Andrews, Tully GRADE, MD  Patient coming from: Home  Chief Complaint:  Chief Complaint  Patient presents with   Abdominal Pain   HPI: George Howe is a 39 y.o. male with medical history significant of with past medical history significant for recurrent pancreatitis with frequent admissions, last admission was 7/25, chronic transaminitis and hyperbilirubinemia, pancreatic pseudocyst, insulin -dependent type 2 diabetes, anxiety and depression, hyper triglyceridemia who presents to the ED for symptoms of upper left-sided abdominal pain with associated nausea and vomiting.  Symptoms were similar to his previous episodes of pancreatitis.  Symptoms started this morning.  He reports compliance with taking his medications including Lipitor and fenofibrate .  He does not report any fever, chills, chest pain, shortness of breath, urinary symptoms.  His blood pressure is somewhat elevated in the ED.  Lab work shows normal WBC count, hemoglobin 17.6.  Lipase is slightly elevated at 123.  Sodium somewhat low at 133.  BUN 22 creatinine 0.98.  AST 53, ALT 47, total bilirubin 2.2.  Triglycerides elevated at 2117.  Review of Systems: As mentioned in the history of present illness. All other systems reviewed and are negative. Past Medical History:  Diagnosis Date   Abnormal CT of the abdomen    Abscess of left axilla    Acute pancreatitis    Anxiety    Bilateral carpal tunnel syndrome    Calculus of gallbladder without cholecystitis without obstruction    Chicken pox    Cholelithiasis    Cigarette nicotine  dependence without complication    Depression    Diabetes mellitus without complication (HCC)    Type 2   Dizziness    DM (diabetes mellitus) (HCC)    Elevated liver function tests    Gall stones    Hay fever    Hyperlipidemia     Hypertriglyceridemia    Morbid obesity (HCC)    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   Pancreatic necrosis    Pancreatitis    PONV (postoperative nausea and vomiting)    Past Surgical History:  Procedure Laterality Date   CARPAL TUNNEL RELEASE Right 10/18/2021   Procedure: Right CARPAL TUNNEL RELEASE;  Surgeon: Romona Harari, MD;  Location: [facility];  Service: Orthopedics;  Laterality: Right;   CARPAL TUNNEL RELEASE Left 11/01/2021   Procedure: Left CARPAL TUNNEL RELEASE;  Surgeon: Romona Harari, MD;  Location: [facility] OR;  Service: Orthopedics;  Laterality: Left;   REFRACTIVE SURGERY  1995   Social History:  reports that he has been smoking cigarettes. He has a 18.5 pack-year smoking history. He has been exposed to tobacco smoke. He has never used smokeless tobacco. He reports that he does not currently use alcohol. He reports current drug use. Drug: Marijuana.  No Known Allergies  Family History  Problem Relation Age of Onset   Diabetes Father    Diabetes Paternal Grandmother     Prior to Admission medications   Medication Sig Start Date End Date Taking? Authorizing Provider  atorvastatin  (LIPITOR) 40 MG tablet Take 1 tablet (40 mg total) by mouth daily. 04/09/24  Yes Theophilus Andrews, Tully GRADE, MD  buPROPion  (WELLBUTRIN  XL) 150 MG 24 hr tablet Take 1 tablet (150 mg total) by mouth daily. 10/28/23  Yes Theophilus Andrews, Tully GRADE, MD  cetirizine  (ZYRTEC ) 10 MG tablet Take 1 tablet (10 mg total) by mouth daily as needed for allergies. 12/19/21  Yes Theophilus Andrews, Tully GRADE, MD  fenofibrate  160 MG tablet Take 1 tablet (160 mg total) by mouth daily. 04/09/24  Yes Theophilus Andrews, Tully GRADE, MD  Glucagon  1 MG/0.2ML SOAJ 1 mg (1 mL) injected subcutaneously or intramuscularly into the upper arm, thigh, or buttocks, or intravenously for hypoglycemia 04/19/23  Yes Theophilus Andrews, Tully GRADE, MD  insulin  degludec (TRESIBA  FLEXTOUCH) 100 UNIT/ML FlexTouch Pen Inject 40 Units  into the skin daily. 04/13/24  Yes Shamleffer, Ibtehal Jaralla, MD  insulin  lispro (HUMALOG  KWIKPEN) 100 UNIT/ML KwikPen Inject up to 60 Units into the skin daily. 04/13/24  Yes Shamleffer, Ibtehal Jaralla, MD  metFORMIN  (GLUCOPHAGE -XR) 500 MG 24 hr tablet Take 2 tablets (1,000 mg total) by mouth daily with breakfast. 04/13/24  Yes Shamleffer, Ibtehal Jaralla, MD  Multiple Vitamins-Minerals (MULTIVITAMIN WITH MINERALS) tablet Take 1 tablet by mouth daily.   Yes [provider]  nicotine  (NICODERM CQ  - DOSED IN MG/24 HOURS) 14 mg/24hr patch Place 1 patch (14 mg total) onto the skin daily. 04/04/24  Yes Akula, Vijaya, MD  Continuous Glucose Sensor (FREESTYLE LIBRE 3 PLUS SENSOR) MISC Change sensor every 15 days. 04/09/24   Theophilus Andrews, Tully GRADE, MD  Insulin  Pen Needle (PEN NEEDLES) 32G X 4 MM MISC Use in the morning, at noon, in the evening, and at bedtime 04/13/24   Shamleffer, Donell Cardinal, MD    Physical Exam: Vitals:   05/12/24 1238 05/12/24 1552  BP: (!) 158/111 (!) 169/124  Pulse: 98 89  Resp: 18 18  Temp: 98.2 F (36.8 C) 99.1 F (37.3 C)  TempSrc: Oral Oral  SpO2: 99% 99%   Physical Exam Constitutional:      General: He is not in acute distress. HENT:     Head: Normocephalic and atraumatic.  Eyes:     Pupils: Pupils are equal, round, and reactive to light.  Cardiovascular:     Rate and Rhythm: Normal rate and regular rhythm.  Pulmonary:     Effort: Pulmonary effort is normal.     Breath sounds: Normal breath sounds.  Abdominal:     General: Abdomen is protuberant.     Palpations: Abdomen is soft.     Tenderness: There is abdominal tenderness in the epigastric area and left upper quadrant.  Skin:    General: Skin is warm.  Neurological:     General: No focal deficit present.     Mental Status: He is alert.  Psychiatric:        Mood and Affect: Mood normal.     Data Reviewed:  Personally reviewed patient's lab results including CBC, CMP, lipase,  triglyceride level.  Assessment and Plan: No notes have been filed under this hospital service. Service: Hospitalist  #1.  Recurrent pancreatitis, possibly related to hypertriglyceridemia. Patient will be admitted to progressive unit, will start on insulin  drip along with D5 LR to help treat hypertriglyceridemia.  Continue with pain control with IV as needed Dilaudid  and as needed oxycodone .  Treat symptoms of nausea and vomiting. Will trend triglycerides as well as repeat lipase level in the morning. CT abdomen and pelvis was not repeated, however if patient's symptoms do not improve, could consider obtaining a CT abdomen pelvis tomorrow. Consider GI consult tomorrow due to recurrent nature of pancreatitis.  #2.  Insulin -dependent type 2 diabetes.  Will hold patient's home insulin  regimen for now since he will be on insulin  drip.  Hold metformin .  #3.  Elevated blood pressure.  Will place on as needed IV hydralazine  and  monitor blood pressure.  #4.  Mild transaminitis/mild hyperbilirubinemia.  Appears to be a chronic issue for the patient.  Will trend.  #5.  Obesity.    Advance Care Planning:   Code Status: Full Code   Consults:   Family Communication: discussed with brother at bedside  Severity of Illness: The appropriate patient status for this patient is INPATIENT. Inpatient status is judged to be reasonable and necessary in order to provide the required intensity of service to ensure the patient's safety. The patient's presenting symptoms, physical exam findings, and initial radiographic and laboratory data in the context of their chronic comorbidities is felt to place them at high risk for further clinical deterioration. Furthermore, it is not anticipated that the patient will be medically stable for discharge from the hospital within 2 midnights of admission.   * I certify that at the point of admission it is my clinical judgment that the patient will require inpatient hospital  care spanning beyond 2 midnights from the point of admission due to high intensity of service, high risk for further deterioration and high frequency of surveillance required.*  Author: Keliyah Spillman A Alson Mcpheeters, MD 05/12/2024 7:13 PM  For on call review [email or web address].

## 2024-05-12 NOTE — ED Provider Notes (Signed)
 [place] [facility] DEPARTMENT AT [facility] Provider Note   CSN: [id number] Arrival date & time: 05/12/24  1229     Patient presents with: Abdominal Pain   George Howe is a 38 y.o. male w/ hx of recurrent pancreatitis, chronic transaminitis and hyperbilirubinemia, pancreatic pseudocyst, insulin -dependent DM type II, generalized anxiety disorder, hyperlipidemia, here with abdominal pain and nausea that began this morning.  The pain is mostly on the left side of his abdomen and his back which he says is reminiscent of his prior pancreatitis issues.  He says he has had 7 hospitalizations for pancreatitis in the past.  Most recently he was hospitalized about a month ago with my review of records, at that time with elevated triglycerides, treated with insulin  infusion inpatient.  He says has been referred to see a specialist at [facility] but has not been able to make this appointment yet.  He says his pain is significant at this time.  Patient reports he has not had any alcohol since April and was only recreational drinker prior to that. He reports a history of a cholecystectomy.  He has noted to have chronic elevated bilirubin levels and mild transaminitis from his last hospitalization course   HPI     Prior to Admission medications   Medication Sig Start Date End Date Taking? Authorizing Provider  atorvastatin  (LIPITOR) 40 MG tablet Take 1 tablet (40 mg total) by mouth daily. 04/09/24   Theophilus Andrews, Tully GRADE, MD  buPROPion  (WELLBUTRIN  XL) 150 MG 24 hr tablet Take 1 tablet (150 mg total) by mouth daily. 10/28/23   Theophilus Andrews, Tully GRADE, MD  cetirizine  (ZYRTEC ) 10 MG tablet Take 1 tablet (10 mg total) by mouth daily as needed for allergies. 12/19/21   Theophilus Andrews, Tully GRADE, MD  Continuous Glucose Sensor (FREESTYLE LIBRE 3 PLUS SENSOR) MISC Change sensor every 15 days. 04/09/24   Theophilus Andrews, Tully GRADE, MD  fenofibrate  160 MG tablet Take 1 tablet (160 mg total) by  mouth daily. 04/09/24   Theophilus Andrews, Tully GRADE, MD  Glucagon  1 MG/0.2ML SOAJ 1 mg (1 mL) injected subcutaneously or intramuscularly into the upper arm, thigh, or buttocks, or intravenously for hypoglycemia 04/19/23   Theophilus Andrews, Tully GRADE, MD  insulin  degludec (TRESIBA  FLEXTOUCH) 100 UNIT/ML FlexTouch Pen Inject 40 Units into the skin daily. 04/13/24   Shamleffer, Ibtehal Jaralla, MD  insulin  lispro (HUMALOG  KWIKPEN) 100 UNIT/ML KwikPen Inject up to 60 Units into the skin daily. 04/13/24   Shamleffer, Ibtehal Jaralla, MD  Insulin  Pen Needle (PEN NEEDLES) 32G X 4 MM MISC Use in the morning, at noon, in the evening, and at bedtime 04/13/24   Shamleffer, Donell Cardinal, MD  metFORMIN  (GLUCOPHAGE -XR) 500 MG 24 hr tablet Take 2 tablets (1,000 mg total) by mouth daily with breakfast. 04/13/24   Shamleffer, Ibtehal Jaralla, MD  Multiple Vitamins-Minerals (MULTIVITAMIN WITH MINERALS) tablet Take 1 tablet by mouth daily.    [provider]  nicotine  (NICODERM CQ  - DOSED IN MG/24 HOURS) 14 mg/24hr patch Place 1 patch (14 mg total) onto the skin daily. 04/04/24   Akula, Vijaya, MD    Allergies: Patient has no known allergies.    Review of Systems  Updated Vital Signs BP (!) 169/124 (BP Location: Right Arm)   Pulse 89   Temp 99.1 F (37.3 C) (Oral)   Resp 18   SpO2 99%   Physical Exam Constitutional:      General: He is not in acute distress. HENT:  Head: Normocephalic and atraumatic.  Eyes:     Conjunctiva/sclera: Conjunctivae normal.     Pupils: Pupils are equal, round, and reactive to light.  Cardiovascular:     Rate and Rhythm: Normal rate and regular rhythm.  Pulmonary:     Effort: Pulmonary effort is normal. No respiratory distress.  Abdominal:     General: There is no distension.     Tenderness: There is generalized abdominal tenderness.  Skin:    General: Skin is warm and dry.  Neurological:     General: No focal deficit present.     Mental Status: He is alert.  Mental status is at baseline.  Psychiatric:        Mood and Affect: Mood normal.        Behavior: Behavior normal.     (all labs ordered are listed, but only abnormal results are displayed) Labs Reviewed  CBC WITH DIFFERENTIAL/PLATELET - Abnormal; Notable for the following components:      Result Value   RBC 6.19 (*)    Hemoglobin 17.6 (*)    All other components within normal limits  COMPREHENSIVE METABOLIC PANEL WITH GFR - Abnormal; Notable for the following components:   Sodium 133 (*)    Glucose, Bld 185 (*)    BUN 22 (*)    AST 53 (*)    ALT 47 (*)    Total Bilirubin 2.2 (*)    All other components within normal limits  LIPASE, BLOOD - Abnormal; Notable for the following components:   Lipase 123 (*)    All other components within normal limits  TRIGLYCERIDES - Abnormal; Notable for the following components:   Triglycerides 2,117 (*)    All other components within normal limits  URINALYSIS, ROUTINE W REFLEX MICROSCOPIC    EKG: None  Radiology: No results found.   .Critical Care  Performed by: Cottie Donnice PARAS, MD Authorized by: Cottie Donnice PARAS, MD   Critical care provider statement:    Critical care time (minutes):  30   Critical care time was exclusive of:  Separately billable procedures and treating other patients   Critical care was time spent personally by me on the following activities:  Ordering and performing treatments and interventions, ordering and review of laboratory studies, ordering and review of radiographic studies, pulse oximetry, review of old charts, examination of patient and evaluation of patient's response to treatment    Medications Ordered in the ED  oxyCODONE -acetaminophen  (PERCOCET/ROXICET) 5-325 MG per tablet 1 tablet (has no administration in time range)  insulin  (MYXREDLIN ) 100 units/100 mL infusion for hypertriglyceridemia-induced pancreatitis (has no administration in time range)  dextrose  5 % solution (has no administration in  time range)  HYDROmorphone  (DILAUDID ) injection 1 mg (1 mg Intravenous Given 05/12/24 1629)  sodium chloride  0.9 % bolus 1,000 mL (1,000 mLs Intravenous New Bag/Given 05/12/24 1645)  ondansetron  (ZOFRAN ) injection 4 mg (4 mg Intravenous Given 05/12/24 1627)  ketorolac  (TORADOL ) 30 MG/ML injection 30 mg (30 mg Intravenous Given 05/12/24 1628)                                    Medical Decision Making Amount and/or Complexity of Data Reviewed Labs: ordered.  Risk Prescription drug management. Decision regarding hospitalization.   This patient presents to the ED with concern for abdominal pain, nausea. This involves an extensive number of treatment options, and is a complaint that carries with it a high  risk of complications and morbidity.  The differential diagnosis includes pancreatitis versus gastritis versus biliary disease versus other  Co-morbidities that complicate the patient evaluation: History of recurrent pancreatitis  External records from outside source obtained and reviewed including most recent hospital discharge summary in July; CT imaging on July 6 showed a 6.7 cystic mass in the region of the pancreatic tail, unchanged and compatible with pseudocyst, inflammatory stranding around the pancreatic body and tail, and fatty liver  I ordered and personally interpreted labs.  The pertinent results include: White blood cell count within normal limits.  Lipase mildly elevated at 123.  Very mild transaminitis with an recent limits.  Bilirubin level 2.2.  Elevated triglyceride levels  The patient was maintained on a cardiac monitor.  I personally viewed and interpreted the cardiac monitored which showed an underlying rhythm of: Sinus rhythm  I ordered medication including IV pain and nausea medication and IV fluids; IV insulin  infusion for hypertriglyceridemia  I have reviewed the patients home medicines and have made adjustments as needed  Test Considered: Doubt AAA, testicular  torsion, mesenteric ischemia   After the interventions noted above, I reevaluated the patient and found that they have: improved    Dispostion:  After consideration of the diagnostic results and the patient's response to treatment, I feel that the patent would benefit from medical admission.      Final diagnoses:  Acute pancreatitis, unspecified complication status, unspecified pancreatitis type  Hypertriglyceridemia    ED Discharge Orders     None          Cottie Donnice PARAS, MD 05/12/24 1754

## 2024-05-12 NOTE — ED Notes (Signed)
 REDRAW L[facility] GRE[facility] SENT TO LAB

## 2024-05-12 NOTE — ED Triage Notes (Signed)
 Pt reports with [facility] since this morning. Pt reports a hx of pancreatitis and would also like his triglycerides checked.

## 2024-05-13 ENCOUNTER — Other Ambulatory Visit: Payer: Self-pay | Admitting: Gastroenterology

## 2024-05-13 ENCOUNTER — Inpatient Hospital Stay (HOSPITAL_COMMUNITY)

## 2024-05-13 DIAGNOSIS — Z794 Long term (current) use of insulin: Secondary | ICD-10-CM

## 2024-05-13 DIAGNOSIS — K863 Pseudocyst of pancreas: Secondary | ICD-10-CM

## 2024-05-13 DIAGNOSIS — E119 Type 2 diabetes mellitus without complications: Secondary | ICD-10-CM

## 2024-05-13 DIAGNOSIS — E781 Pure hyperglyceridemia: Secondary | ICD-10-CM

## 2024-05-13 DIAGNOSIS — K859 Acute pancreatitis without necrosis or infection, unspecified: Secondary | ICD-10-CM

## 2024-05-13 DIAGNOSIS — K760 Fatty (change of) liver, not elsewhere classified: Secondary | ICD-10-CM

## 2024-05-13 DIAGNOSIS — R1084 Generalized abdominal pain: Secondary | ICD-10-CM

## 2024-05-13 LAB — GLUCOSE, CAPILLARY
Glucose-Capillary: 100 mg/dL — ABNORMAL HIGH (ref 70–99)
Glucose-Capillary: 102 mg/dL — ABNORMAL HIGH (ref 70–99)
Glucose-Capillary: 104 mg/dL — ABNORMAL HIGH (ref 70–99)
Glucose-Capillary: 107 mg/dL — ABNORMAL HIGH (ref 70–99)
Glucose-Capillary: 108 mg/dL — ABNORMAL HIGH (ref 70–99)
Glucose-Capillary: 111 mg/dL — ABNORMAL HIGH (ref 70–99)
Glucose-Capillary: 112 mg/dL — ABNORMAL HIGH (ref 70–99)
Glucose-Capillary: 120 mg/dL — ABNORMAL HIGH (ref 70–99)
Glucose-Capillary: 123 mg/dL — ABNORMAL HIGH (ref 70–99)
Glucose-Capillary: 124 mg/dL — ABNORMAL HIGH (ref 70–99)
Glucose-Capillary: 125 mg/dL — ABNORMAL HIGH (ref 70–99)
Glucose-Capillary: 127 mg/dL — ABNORMAL HIGH (ref 70–99)
Glucose-Capillary: 132 mg/dL — ABNORMAL HIGH (ref 70–99)
Glucose-Capillary: 143 mg/dL — ABNORMAL HIGH (ref 70–99)
Glucose-Capillary: 68 mg/dL — ABNORMAL LOW (ref 70–99)
Glucose-Capillary: 81 mg/dL (ref 70–99)
Glucose-Capillary: 84 mg/dL (ref 70–99)
Glucose-Capillary: 87 mg/dL (ref 70–99)
Glucose-Capillary: 92 mg/dL (ref 70–99)
Glucose-Capillary: 96 mg/dL (ref 70–99)

## 2024-05-13 LAB — URINALYSIS, ROUTINE W REFLEX MICROSCOPIC
Bacteria, UA: NONE SEEN
Bilirubin Urine: NEGATIVE
Glucose, UA: 150 mg/dL — AB
Hgb urine dipstick: NEGATIVE
Ketones, ur: NEGATIVE mg/dL
Leukocytes,Ua: NEGATIVE
Nitrite: NEGATIVE
Protein, ur: 30 mg/dL — AB
Specific Gravity, Urine: 1.03 (ref 1.005–1.030)
pH: 5 (ref 5.0–8.0)

## 2024-05-13 LAB — COMPREHENSIVE METABOLIC PANEL WITH GFR
ALT: 42 U/L (ref 0–44)
AST: 23 U/L (ref 15–41)
Albumin: 3.6 g/dL (ref 3.5–5.0)
Alkaline Phosphatase: 61 U/L (ref 38–126)
Anion gap: 7 (ref 5–15)
BUN: 18 mg/dL (ref 6–20)
CO2: 22 mmol/L (ref 22–32)
Calcium: 8.3 mg/dL — ABNORMAL LOW (ref 8.9–10.3)
Chloride: 106 mmol/L (ref 98–111)
Creatinine, Ser: 0.9 mg/dL (ref 0.61–1.24)
GFR, Estimated: >60 mL/min (ref >60)
Glucose, Bld: 105 mg/dL — ABNORMAL HIGH (ref 70–99)
Potassium: 3.2 mmol/L — ABNORMAL LOW (ref 3.5–5.1)
Sodium: 135 mmol/L (ref 135–145)
Total Bilirubin: 1.3 mg/dL — ABNORMAL HIGH (ref 0.0–1.2)
Total Protein: 6.1 g/dL — ABNORMAL LOW (ref 6.5–8.1)

## 2024-05-13 LAB — CBC
HCT: 40.9 % (ref 39.0–52.0)
Hemoglobin: 14.6 g/dL (ref 13.0–17.0)
MCH: 29.1 pg (ref 26.0–34.0)
MCHC: 35.7 g/dL (ref 30.0–36.0)
MCV: 81.6 fL (ref 80.0–100.0)
Platelets: 219 K/uL (ref 150–400)
RBC: 5.01 MIL/uL (ref 4.22–5.81)
RDW: 12.9 % (ref 11.5–15.5)
WBC: 9.5 K/uL (ref 4.0–10.5)
nRBC: 0 % (ref 0.0–0.2)

## 2024-05-13 LAB — MRSA NEXT GEN BY PCR, NASAL: MRSA by PCR Next Gen: NOT DETECTED

## 2024-05-13 LAB — TRIGLYCERIDES
Triglycerides: 433 mg/dL — ABNORMAL HIGH (ref <150)
Triglycerides: 235 mg/dL — ABNORMAL HIGH (ref <150)

## 2024-05-13 LAB — LIPASE, BLOOD: Lipase: 186 U/L — ABNORMAL HIGH (ref 11–51)

## 2024-05-13 MED ORDER — IOHEXOL 300 MG/ML  SOLN
100.0000 mL | Freq: Once | INTRAMUSCULAR | Status: AC | PRN
  Administered 2024-05-13: 100 mL via INTRAVENOUS

## 2024-05-13 MED ORDER — POTASSIUM CHLORIDE CRYS ER 20 MEQ PO TBCR
60.0000 meq | EXTENDED_RELEASE_TABLET | Freq: Two times a day (BID) | ORAL | Status: AC
Start: 2024-05-13 — End: 2024-05-13
  Administered 2024-05-13 (×2): 60 meq via ORAL
  Filled 2024-05-13 (×2): qty 3

## 2024-05-13 MED ORDER — PROCHLORPERAZINE EDISYLATE 10 MG/2ML IJ SOLN
5.0000 mg | Freq: Once | INTRAMUSCULAR | Status: AC
  Administered 2024-05-13: 5 mg via INTRAVENOUS
  Filled 2024-05-13: qty 2

## 2024-05-13 MED ORDER — DEXTROSE 10 % IV SOLN: INTRAVENOUS | Status: DC

## 2024-05-13 MED ORDER — PROCHLORPERAZINE EDISYLATE 10 MG/2ML IJ SOLN
5.0000 mg | Freq: Four times a day (QID) | INTRAMUSCULAR | Status: DC | PRN
  Administered 2024-05-13: 5 mg via INTRAVENOUS
  Filled 2024-05-13: qty 2

## 2024-05-13 MED ORDER — ORAL CARE MOUTH RINSE: 15.0000 mL | OROMUCOSAL | Status: DC | PRN

## 2024-05-13 MED ORDER — IOHEXOL 350 MG/ML SOLN: 100.0000 mL | Freq: Once | INTRAVENOUS | Status: DC | PRN

## 2024-05-13 MED ORDER — DEXTROSE IN LACTATED RINGERS 5 % IV SOLN: INTRAVENOUS | Status: DC

## 2024-05-13 NOTE — Consult Note (Signed)
 Referring Provider: Dr. Redia Primary Care Physician:  Theophilus Andrews, Tully GRADE, MD Primary Gastroenterologist:  Dr. Abran  Reason for Consultation:  Acute pancreatitis   HPI: George Howe is a 38 y.o. male with medical history significant of with past medical history significant for recurrent pancreatitis with frequent admissions, last admission was 7/25, chronic transaminitis and hyperbilirubinemia, pancreatic pseudocyst, insulin -dependent type 2 diabetes, anxiety and depression, hypertriglyceridemia who presents to the ED for symptoms of upper left-sided abdominal pain with associated nausea and vomiting.  Symptoms were similar to his previous episodes of pancreatitis.  Symptoms started the morning of 8/19 and prior to the onset he had been feeling completely normal without pain.  He reports compliance with taking his medications including Lipitor and fenofibrate .    Triglycerides were over 2000 yesterday when he presented to the emergency department.  Started on insulin  drip and D5 LR.  Down to 433 today.  Lipase 123, 186 today.  White blood cell count is 11.2 yesterday, normal today.  Total bili 2.2, alk phos 85, ALT 47, AST 53.  CT scan of the abdomen and pelvis with contrast:  IMPRESSION: 1. Similar to slight decrease in pancreatitis compared to 03/29/2024. 2. Unchanged 6.4 cm pseudocyst in the pancreatic tail. No evidence of pancreatic necrosis. 3. Hepatic steatosis.  Patient's brother is a Advice worker with IR here at the hospital.  He was at bedside during our visit.  Past Medical History:  Diagnosis Date   Abnormal CT of the abdomen    Abscess of left axilla    Acute pancreatitis    Anxiety    Bilateral carpal tunnel syndrome    Calculus of gallbladder without cholecystitis without obstruction    Chicken pox    Cholelithiasis    Cigarette nicotine  dependence without complication    Depression    Diabetes mellitus without complication (HCC)    Type 2    Dizziness    DM (diabetes mellitus) (HCC)    Elevated liver function tests    Gall stones    Hay fever    Hyperlipidemia    Hypertriglyceridemia    Morbid obesity (HCC)    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   Pancreatic necrosis    Pancreatitis    PONV (postoperative nausea and vomiting)     Past Surgical History:  Procedure Laterality Date   CARPAL TUNNEL RELEASE Right 10/18/2021   Procedure: Right CARPAL TUNNEL RELEASE;  Surgeon: Romona Harari, MD;  Location: [facility];  Service: Orthopedics;  Laterality: Right;   CARPAL TUNNEL RELEASE Left 11/01/2021   Procedure: Left CARPAL TUNNEL RELEASE;  Surgeon: Romona Harari, MD;  Location: [facility] OR;  Service: Orthopedics;  Laterality: Left;   REFRACTIVE SURGERY  1995    Prior to Admission medications   Medication Sig Start Date End Date Taking? Authorizing Provider  atorvastatin  (LIPITOR) 40 MG tablet Take 1 tablet (40 mg total) by mouth daily. 04/09/24  Yes Theophilus Andrews, Tully GRADE, MD  buPROPion  (WELLBUTRIN  XL) 150 MG 24 hr tablet Take 1 tablet (150 mg total) by mouth daily. 10/28/23  Yes Theophilus Andrews, Tully GRADE, MD  cetirizine  (ZYRTEC ) 10 MG tablet Take 1 tablet (10 mg total) by mouth daily as needed for allergies. 12/19/21  Yes Theophilus Andrews Tully GRADE, MD  fenofibrate  160 MG tablet Take 1 tablet (160 mg total) by mouth daily. 04/09/24  Yes Theophilus Andrews, Tully GRADE, MD  Glucagon  1 MG/0.2ML SOAJ 1 mg (1 mL) injected subcutaneously or intramuscularly into the upper arm, thigh, or  buttocks, or intravenously for hypoglycemia 04/19/23  Yes Theophilus Andrews, Tully GRADE, MD  insulin  degludec (TRESIBA  FLEXTOUCH) 100 UNIT/ML FlexTouch Pen Inject 40 Units into the skin daily. 04/13/24  Yes Shamleffer, Ibtehal Jaralla, MD  insulin  lispro (HUMALOG  KWIKPEN) 100 UNIT/ML KwikPen Inject up to 60 Units into the skin daily. 04/13/24  Yes Shamleffer, Ibtehal Jaralla, MD  metFORMIN  (GLUCOPHAGE -XR) 500 MG 24 hr tablet Take 2 tablets  (1,000 mg total) by mouth daily with breakfast. 04/13/24  Yes Shamleffer, Ibtehal Jaralla, MD  Multiple Vitamins-Minerals (MULTIVITAMIN WITH MINERALS) tablet Take 1 tablet by mouth daily.   Yes [provider]  nicotine  (NICODERM CQ  - DOSED IN MG/24 HOURS) 14 mg/24hr patch Place 1 patch (14 mg total) onto the skin daily. 04/04/24  Yes Akula, Vijaya, MD  Continuous Glucose Sensor (FREESTYLE LIBRE 3 PLUS SENSOR) MISC Change sensor every 15 days. 04/09/24   Theophilus Andrews, Tully GRADE, MD  Insulin  Pen Needle (PEN NEEDLES) 32G X 4 MM MISC Use in the morning, at noon, in the evening, and at bedtime 04/13/24   Shamleffer, Donell Cardinal, MD    Current Facility-Administered Medications  Medication Dose Route Frequency Provider Last Rate Last Admin   acetaminophen  (TYLENOL ) tablet 650 mg  650 mg Oral Q6H PRN Arshad, Mohsin A, MD       Or   acetaminophen  (TYLENOL ) suppository 650 mg  650 mg Rectal Q6H PRN Arshad, Mohsin A, MD       atorvastatin  (LIPITOR) tablet 40 mg  40 mg Oral Daily Arshad, Mohsin A, MD   40 mg at 05/12/24 2131   buPROPion  (WELLBUTRIN  XL) 24 hr tablet 150 mg  150 mg Oral Daily Arshad, Mohsin A, MD       Chlorhexidine  Gluconate Cloth 2 % PADS 6 each  6 each Topical Daily Arshad, Mohsin A, MD       dextrose  5 % in lactated ringers  infusion   Intravenous Continuous Cindy Garnette POUR, MD       enoxaparin  (LOVENOX ) injection 40 mg  40 mg Subcutaneous Q24H Arshad, Mohsin A, MD   40 mg at 05/12/24 2131   fenofibrate  tablet 160 mg  160 mg Oral Daily Arshad, Mohsin A, MD   160 mg at 05/12/24 2139   hydrALAZINE  (APRESOLINE ) injection 10 mg  10 mg Intravenous Q6H PRN Arshad, Mohsin A, MD       HYDROmorphone  (DILAUDID ) injection 0.5-1 mg  0.5-1 mg Intravenous Q4H PRN Arshad, Mohsin A, MD   1 mg at 05/13/24 0748   insulin  (MYXREDLIN ) 100 units/100 mL infusion for hypertriglyceridemia-induced pancreatitis  0.1 Units/kg/hr Intravenous Continuous Arshad, Mohsin A, MD 8.8 mL/hr at 05/13/24 0833 0.1  Units/kg/hr at 05/13/24 9166   loratadine  (CLARITIN ) tablet 10 mg  10 mg Oral Daily Arshad, Mohsin A, MD       nicotine  (NICODERM CQ  - dosed in mg/24 hours) patch 14 mg  14 mg Transdermal Daily Arshad, Mohsin A, MD   14 mg at 05/12/24 2131   ondansetron  (ZOFRAN ) tablet 4 mg  4 mg Oral Q6H PRN Arshad, Mohsin A, MD       Or   ondansetron  (ZOFRAN ) injection 4 mg  4 mg Intravenous Q6H PRN Arshad, Mohsin A, MD   4 mg at 05/12/24 2335   Oral care mouth rinse  15 mL Mouth Rinse PRN Cindy Garnette POUR, MD       oxyCODONE -acetaminophen  (PERCOCET/ROXICET) 5-325 MG per tablet 1 tablet  1 tablet Oral Q6H PRN Arshad, Mohsin A, MD   1 tablet  at 05/13/24 0620   traZODone  (DESYREL ) tablet 50 mg  50 mg Oral QHS PRN Arshad, Mohsin A, MD   50 mg at 05/13/24 0321    Allergies as of 05/12/2024   (No Known Allergies)    Family History  Problem Relation Age of Onset   Diabetes Father    Diabetes Paternal Grandmother     Social History   Socioeconomic History   Marital status: Single    Spouse name: Not on file   Number of children: Not on file   Years of education: Not on file   Highest education level: Associate degree: occupational, Scientist, product/process development, or vocational program  Occupational History   Not on file  Tobacco Use   Smoking status: Every Day    Current packs/day: 0.50    Average packs/day: 0.5 packs/day for 37.0 years (18.5 ttl pk-yrs)    Types: Cigarettes    Passive exposure: Past   Smokeless tobacco: Never  Vaping Use   Vaping status: Never Used  Substance and Sexual Activity   Alcohol use: Not Currently    Comment: occasional   Drug use: Yes    Types: Marijuana   Sexual activity: Yes  Other Topics Concern   Not on file  Social History Narrative   Not on file   Social Drivers of Health   Financial Resource Strain: Medium Risk (02/21/2023)   Overall Financial Resource Strain (CARDIA)    Difficulty of Paying Living Expenses: Somewhat hard  Food Insecurity: No Food Insecurity (05/12/2024)    Hunger Vital Sign    Worried About Running Out of Food in the Last Year: Never true    Ran Out of Food in the Last Year: Never true  Transportation Needs: No Transportation Needs (05/12/2024)   PRAPARE - Administrator, Civil Service (Medical): No    Lack of Transportation (Non-Medical): No  Physical Activity: Insufficiently Active (02/21/2023)   Exercise Vital Sign    Days of Exercise per Week: 4 days    Minutes of Exercise per Session: 30 min  Stress: Stress Concern Present (02/21/2023)   [employer] of Occupational Health - Occupational Stress Questionnaire    Feeling of Stress : To some extent  Social Connections: Moderately Integrated (03/30/2024)   Social Connection and Isolation Panel    Frequency of Communication with Friends and Family: Three times a week    Frequency of Social Gatherings with Friends and Family: Three times a week    Attends Religious Services: 1 to 4 times per year    Active Member of Clubs or Organizations: Yes    Attends Banker Meetings: 1 to 4 times per year    Marital Status: Never married  Intimate Partner Violence: Not At Risk (05/12/2024)   Humiliation, Afraid, Rape, and Kick questionnaire    Fear of Current or Ex-Partner: No    Emotionally Abused: No    Physically Abused: No    Sexually Abused: No    Review of Systems: ROS is O/W negative except as mentioned in HPI.  Physical Exam: Vital signs in last 24 hours: Temp:  [98.1 F (36.7 C)-99.1 F (37.3 C)] 98.1 F (36.7 C) (08/20 0730) Pulse Rate:  [68-98] 69 (08/20 0800) Resp:  [11-19] 11 (08/20 0800) BP: (98-169)/(45-124) 114/63 (08/20 0800) SpO2:  [92 %-99 %] 93 % (08/20 0800) Weight:  [88 kg-90.7 kg] 90.7 kg (08/19 2117) Last BM Date : 05/12/24 General:  Alert, Well-developed, well-nourished, pleasant and cooperative in NAD Head:  Normocephalic  and atraumatic. Eyes:  Sclera clear, no icterus.  Conjunctiva pink. Ears:  Normal auditory acuity. Mouth:  No  deformity or lesions.   Lungs:  Clear throughout to auscultation.  No wheezes, crackles, or rhonchi.  Heart:  Regular rate and rhythm; no murmurs, clicks, rubs, or gallops. Abdomen:  Soft, non-distended.  BS present.  Epigastric to LUQ TTP. Msk:  Symmetrical without gross deformities. Pulses:  Normal pulses noted. Extremities:  Without clubbing or edema. Neurologic:  Alert and oriented x 4;  grossly normal neurologically. Skin:  Intact without significant lesions or rashes. Psych:  Alert and cooperative. Normal mood and affect.  Intake/Output from previous day: 08/19 0701 - 08/20 0700 In: 1180.4 [I.V.:1180.4] Out: -  Intake/Output this shift: Total I/O In: 159 [I.V.:159] Out: -   Lab Results: Recent Labs    05/12/24 1239 05/12/24 2139 05/13/24 0326  WBC 10.2 11.2* 9.5  HGB 17.6* 15.8 14.6  HCT 49.8 45.1 40.9  PLT 287 238 219   BMET Recent Labs    05/12/24 1343 05/12/24 2139 05/13/24 0326  NA 133*  --  135  K 4.9  --  3.2*  CL 100  --  106  CO2 24  --  22  GLUCOSE 185*  --  105*  BUN 22*  --  18  CREATININE 0.98 0.89 0.90  CALCIUM  9.1  --  8.3*   LFT Recent Labs    05/13/24 0326  PROT 6.1*  ALBUMIN 3.6  AST 23  ALT 42  ALKPHOS 61  BILITOT 1.3*   Studies/Results: CT ABDOMEN PELVIS W CONTRAST Result Date: 05/13/2024 CLINICAL DATA:  Recurrent pancreatitis, mid abdominal pain, nausea, vomiting EXAM: CT ABDOMEN AND PELVIS WITH CONTRAST TECHNIQUE: Multidetector CT imaging of the abdomen and pelvis was performed using the standard protocol following bolus administration of intravenous contrast. RADIATION DOSE REDUCTION: This exam was performed according to the departmental dose-optimization program which includes automated exposure control, adjustment of the mA and/or kV according to patient size and/or use of iterative reconstruction technique. CONTRAST:  OMNIPAQUE  IOHEXOL  300 MG/ML  SOLN COMPARISON:  03/29/2024 FINDINGS: Lower chest: No acute abnormality.  Hepatobiliary: Hepatic steatosis. Cholecystectomy. No biliary dilation. Pancreas: Similar to slight decrease in peripancreatic stranding and fluid compared to 03/29/2024. 6.4 cm pseudocyst in the pancreatic tail is unchanged. No ductal dilation. No evidence of pancreatic necrosis. Spleen: Unremarkable. Adrenals/Urinary Tract: Unremarkable. Stomach/Bowel: Reactive inflammation of the duodenum. No bowel obstruction. Normal appendix. Vascular/Lymphatic: No significant vascular findings are present. No enlarged abdominal or pelvic lymph nodes. Reproductive: Unremarkable. Other: No free intraperitoneal air. Subcutaneous gas in the left abdominal wall likely due to injections. Musculoskeletal: No acute fracture. IMPRESSION: 1. Similar to slight decrease in pancreatitis compared to 03/29/2024. 2. Unchanged 6.4 cm pseudocyst in the pancreatic tail. No evidence of pancreatic necrosis. 3. Hepatic steatosis. Electronically Signed   By: Norman Gatlin M.D.   On: 05/13/2024 03:25    IMPRESSION:  38 year old male with a history of hypertriglyceridemia and recurrent acute pancreatitis with pseudocyst admitted with N/V and epigastric/LUQ pain. CTAP with contrast similar to improved from previous.  Pancreatitis present secondary to pancreatitis previously, but he has been compliant with all of his medications and he had a triglyceride level a month ago as an outpatient with normal level of triglycerides.  Question if the pancreatitis is actually because of the high triglycerides on this occasion rather than vice versa.   Prior history of gallstones s/p robotic assisted laparoscopic cholecystectomy as an outpatient which was performed by  Dr. Vickie 07/05/2023   Hepatic steatosis, elevated LFTs. LFTs continue to drift downward/normalized currently. Acute hepatitis panel negative. Started on Fenofibrate  and Atorvastatin  7/7. CTAP 7/6 showed diffuse hepatic steatosis.   DM type II on insulin     PLAN: - Will send genetic  testing for a PRSS, Spink 1, and CFTR mutation. - Possible EUS with fluid sampling of the cystic lesion on 8/22. - Referral was placed to [facility].  Brother is asking if there is any way that that can be expedited, if Dr. Wilhelmenia would be able to reach out to them.  Has been over a month and they have not heard anything despite making 2 phone calls on their own. - Otherwise continue supportive care with IV fluids, pain control, etc.  Continue insulin  drip and D5 LR. - Clear liquid diet for now.   Harlene BIRCH. George Howe  05/13/2024, 9:14 AM

## 2024-05-13 NOTE — Progress Notes (Signed)
  Progress Note   Patient: George Howe FMW:[id number] DOB: 1986/08/13 DOA: 05/12/2024     1 DOS: the patient was seen and examined on 05/13/2024   Brief hospital course: 38 y.o. male with medical history significant of with past medical history significant for recurrent pancreatitis with frequent admissions, last admission was 7/25, chronic transaminitis and hyperbilirubinemia, pancreatic pseudocyst, insulin -dependent type 2 diabetes, anxiety and depression, hyper triglyceridemia who presents to the ED for symptoms of upper left-sided abdominal pain with associated nausea and vomiting.  Symptoms were similar to his previous episodes of pancreatitis.  Symptoms started this morning.  He reports compliance with taking his medications including Lipitor and fenofibrate .  He does not report any fever, chills, chest pain, shortness of breath, urinary symptoms.   Assessment and Plan: #1.  Recurrent pancreatitis, possibly related to hypertriglyceridemia. -Pt is continued on insulin  gtt -Presenting TG remained elevated in excess of 2k -GI consulted, f/u on recs -Currently remains NPO with IVF hydration and analgesia as needed, continued supportive measures   #2.  Insulin -dependent type 2 diabetes.   -Continued on insulin  gtt per above -glycemic trends stable   #3.  Elevated blood pressure.   -BP stable -cont PRN hydralazine  for now   #4.  Mild transaminitis/mild hyperbilirubinemia.  Appears to be a chronic issue for the patient.  -cont to follow LFT trends   #5.  Obesity. -recommend diet/lifestyle modification      Subjective: Still having abd discomfort this AM  Physical Exam: Vitals:   05/13/24 1110 05/13/24 1200 05/13/24 1300 05/13/24 1400  BP:  116/76 109/80 123/72  Pulse:  70 67 71  Resp:  12 14 12   Temp: 98.1 F (36.7 C)     TempSrc: Oral     SpO2:  95% 94% 97%  Weight:      Height:       General exam: Awake, laying in bed, in nad Respiratory system: Normal respiratory  effort, no wheezing Cardiovascular system: regular rate, s1, s2 Gastrointestinal system: Soft, nondistended, positive BS Central nervous system: CN2-12 grossly intact, strength intact Extremities: Perfused, no clubbing Skin: Normal skin turgor, no notable skin lesions seen Psychiatry: Mood normal // no visual hallucinations   Data Reviewed:  Labs reviewed: Na 135, K 3.2, Cr 0.90, WBC 9.5, Hgb 14.6, Plts 219, lipase 186  Family Communication: Pt in room, family not at bedside  Disposition: Status is: Inpatient Remains inpatient appropriate because: severity of illness  Planned Discharge Destination: Home    Author: Garnette Pelt, MD 05/13/2024 2:20 PM  For on call review [email or web address].

## 2024-05-13 NOTE — Plan of Care (Signed)
  Problem: Nutrition: Goal: Adequate nutrition will be maintained Outcome: Progressing   Problem: Pain Managment: Goal: General experience of comfort will improve and/or be controlled Outcome: Progressing   Problem: Skin Integrity: Goal: Risk for impaired skin integrity will decrease Outcome: Progressing

## 2024-05-13 NOTE — Hospital Course (Signed)
 37 y.o. male with medical history significant of with past medical history significant for recurrent pancreatitis with frequent admissions, last admission was 7/25, chronic transaminitis and hyperbilirubinemia, pancreatic pseudocyst, insulin -dependent type 2 diabetes, anxiety and depression, hyper triglyceridemia who presents to the ED for symptoms of upper left-sided abdominal pain with associated nausea and vomiting.  Symptoms were similar to his previous episodes of pancreatitis.  Symptoms started this morning.  He reports compliance with taking his medications including Lipitor and fenofibrate .  He does not report any fever, chills, chest pain, shortness of breath, urinary symptoms.

## 2024-05-13 NOTE — Progress Notes (Signed)
 Patient was given oral contrast around 2300, shortly after he expressed that he was having some nausea and was given IV zofran  and was able to continue drinking contrast, taking small sips. MD made aware

## 2024-05-14 DIAGNOSIS — K859 Acute pancreatitis without necrosis or infection, unspecified: Secondary | ICD-10-CM | POA: Diagnosis not present

## 2024-05-14 DIAGNOSIS — E46 Unspecified protein-calorie malnutrition: Secondary | ICD-10-CM

## 2024-05-14 DIAGNOSIS — K8500 Idiopathic acute pancreatitis without necrosis or infection: Secondary | ICD-10-CM

## 2024-05-14 LAB — TRIGLYCERIDES
Triglycerides: 154 mg/dL — ABNORMAL HIGH (ref <150)
Triglycerides: 164 mg/dL — ABNORMAL HIGH (ref <150)
Triglycerides: 127 mg/dL (ref <150)

## 2024-05-14 LAB — GLUCOSE, CAPILLARY
Glucose-Capillary: 100 mg/dL — ABNORMAL HIGH (ref 70–99)
Glucose-Capillary: 113 mg/dL — ABNORMAL HIGH (ref 70–99)
Glucose-Capillary: 119 mg/dL — ABNORMAL HIGH (ref 70–99)
Glucose-Capillary: 119 mg/dL — ABNORMAL HIGH (ref 70–99)
Glucose-Capillary: 132 mg/dL — ABNORMAL HIGH (ref 70–99)
Glucose-Capillary: 133 mg/dL — ABNORMAL HIGH (ref 70–99)
Glucose-Capillary: 140 mg/dL — ABNORMAL HIGH (ref 70–99)
Glucose-Capillary: 144 mg/dL — ABNORMAL HIGH (ref 70–99)
Glucose-Capillary: 156 mg/dL — ABNORMAL HIGH (ref 70–99)
Glucose-Capillary: 180 mg/dL — ABNORMAL HIGH (ref 70–99)
Glucose-Capillary: 184 mg/dL — ABNORMAL HIGH (ref 70–99)
Glucose-Capillary: 68 mg/dL — ABNORMAL LOW (ref 70–99)
Glucose-Capillary: 86 mg/dL (ref 70–99)
Glucose-Capillary: 96 mg/dL (ref 70–99)

## 2024-05-14 LAB — COMPREHENSIVE METABOLIC PANEL WITH GFR
ALT: 78 U/L — ABNORMAL HIGH (ref 0–44)
AST: 89 U/L — ABNORMAL HIGH (ref 15–41)
Albumin: 3.2 g/dL — ABNORMAL LOW (ref 3.5–5.0)
Alkaline Phosphatase: 57 U/L (ref 38–126)
Anion gap: 8 (ref 5–15)
BUN: 5 mg/dL — ABNORMAL LOW (ref 6–20)
CO2: 22 mmol/L (ref 22–32)
Calcium: 8.3 mg/dL — ABNORMAL LOW (ref 8.9–10.3)
Chloride: 105 mmol/L (ref 98–111)
Creatinine, Ser: 0.82 mg/dL (ref 0.61–1.24)
GFR, Estimated: >60 mL/min (ref >60)
Glucose, Bld: 121 mg/dL — ABNORMAL HIGH (ref 70–99)
Potassium: 3.6 mmol/L (ref 3.5–5.1)
Sodium: 135 mmol/L (ref 135–145)
Total Bilirubin: 2.2 mg/dL — ABNORMAL HIGH (ref 0.0–1.2)
Total Protein: 5.9 g/dL — ABNORMAL LOW (ref 6.5–8.1)

## 2024-05-14 MED ORDER — SALINE SPRAY 0.65 % NA SOLN
1.0000 | NASAL | Status: DC | PRN
  Administered 2024-05-14 (×2): 1 via NASAL
  Filled 2024-05-14: qty 44

## 2024-05-14 MED ORDER — DEXTROSE 50 % IV SOLN
25.0000 mL | Freq: Once | INTRAVENOUS | Status: AC
  Administered 2024-05-14: 25 mL via INTRAVENOUS
  Filled 2024-05-14: qty 50

## 2024-05-14 MED ORDER — BOOST / RESOURCE BREEZE PO LIQD CUSTOM
1.0000 | Freq: Three times a day (TID) | ORAL | Status: DC
  Administered 2024-05-14 – 2024-05-17 (×7): 1 via ORAL

## 2024-05-14 MED ORDER — INSULIN ASPART 100 UNIT/ML IJ SOLN
0.0000 [IU] | INTRAMUSCULAR | Status: DC
  Administered 2024-05-14: 3 [IU] via SUBCUTANEOUS
  Administered 2024-05-14: 2 [IU] via SUBCUTANEOUS
  Administered 2024-05-14 – 2024-05-15 (×6): 3 [IU] via SUBCUTANEOUS
  Administered 2024-05-16: 5 [IU] via SUBCUTANEOUS
  Administered 2024-05-16: 3 [IU] via SUBCUTANEOUS
  Administered 2024-05-16 – 2024-05-17 (×3): 2 [IU] via SUBCUTANEOUS
  Administered 2024-05-17: 3 [IU] via SUBCUTANEOUS
  Administered 2024-05-17: 11 [IU] via SUBCUTANEOUS
  Administered 2024-05-17 (×2): 2 [IU] via SUBCUTANEOUS
  Administered 2024-05-17 – 2024-05-18 (×2): 8 [IU] via SUBCUTANEOUS
  Administered 2024-05-18: 5 [IU] via SUBCUTANEOUS
  Administered 2024-05-18: 3 [IU] via SUBCUTANEOUS
  Administered 2024-05-18 (×2): 2 [IU] via SUBCUTANEOUS

## 2024-05-14 MED ORDER — DEXTROSE IN LACTATED RINGERS 5 % IV SOLN: INTRAVENOUS | Status: AC

## 2024-05-14 NOTE — Progress Notes (Signed)
   05/14/24 1616  TOC Brief Assessment  Insurance and Status Reviewed  Patient has primary care physician Yes  Home environment has been reviewed Single family home  Prior level of function: Independent with ADL's  Prior/Current Home Services No current home services  Social Drivers of Health Review SDOH reviewed no interventions necessary  Readmission risk has been reviewed Yes  Transition of care needs no transition of care needs at this time

## 2024-05-14 NOTE — Progress Notes (Signed)
 Chaplains received a consult to assist George Howe with HCPOA.  I met with him and he remembered our conversation from his last hospitalization.  He has the paperwork and an understanding of it and plans to fill it out when things settle down.  He is in a good bit of pain right now. He appeared in good spirits, but shared his fatigue of having an ongoing issue that continues to lead to hospitalizations.  He hopes the medical team is able to figure out what his body needs.

## 2024-05-14 NOTE — Progress Notes (Signed)
 [facility] Progress Note  CC:   Acute pancreatitis   Subjective:  Still with quite a bit of pain, 7-8 out of 10.  Tolerating clear liquids.  Had a BM last night and this AM.  Objective:  Vital signs in last 24 hours: Temp:  [97.5 F (36.4 C)-98.9 F (37.2 C)] 98.9 F (37.2 C) (08/21 0756) Pulse Rate:  [64-77] 70 (08/21 0800) Resp:  [9-22] 12 (08/21 0800) BP: (96-142)/(61-98) 96/69 (08/21 0800) SpO2:  [92 %-97 %] 96 % (08/21 0800) Last BM Date : 05/13/24 General:  Alert, Well-developed, in NAD Heart:  Regular rate and rhythm; no murmurs Pulm:  CTAB.  No W/R/R. Abdomen:  Soft, non-distended.  BS present.  Upper abdominal TTP. Extremities:  Without edema. Neurologic:  Alert and oriented x 4;  grossly normal neurologically. Psych:  Alert and cooperative. Normal mood and affect.  Intake/Output from previous day: 08/20 0701 - 08/21 0700 In: 3325.7 [P.O.:120; I.V.:3205.7] Out: -  Intake/Output this shift: Total I/O In: 620.7 [I.V.:620.7] Out: -   Lab Results: Recent Labs    05/12/24 1239 05/12/24 2139 05/13/24 0326  WBC 10.2 11.2* 9.5  HGB 17.6* 15.8 14.6  HCT 49.8 45.1 40.9  PLT 287 238 219   BMET Recent Labs    05/12/24 1343 05/12/24 2139 05/13/24 0326  NA 133*  --  135  K 4.9  --  3.2*  CL 100  --  106  CO2 24  --  22  GLUCOSE 185*  --  105*  BUN 22*  --  18  CREATININE 0.98 0.89 0.90  CALCIUM  9.1  --  8.3*   LFT Recent Labs    05/13/24 0326  PROT 6.1*  ALBUMIN 3.6  AST 23  ALT 42  ALKPHOS 61  BILITOT 1.3*   CT ABDOMEN PELVIS W CONTRAST Result Date: 05/13/2024 CLINICAL DATA:  Recurrent pancreatitis, mid abdominal pain, nausea, vomiting EXAM: CT ABDOMEN AND PELVIS WITH CONTRAST TECHNIQUE: Multidetector CT imaging of the abdomen and pelvis was performed using the standard protocol following bolus administration of intravenous contrast. RADIATION DOSE REDUCTION: This exam was performed according to the departmental  dose-optimization program which includes automated exposure control, adjustment of the mA and/or kV according to patient size and/or use of iterative reconstruction technique. CONTRAST:  OMNIPAQUE  IOHEXOL  300 MG/ML  SOLN COMPARISON:  03/29/2024 FINDINGS: Lower chest: No acute abnormality. Hepatobiliary: Hepatic steatosis. Cholecystectomy. No biliary dilation. Pancreas: Similar to slight decrease in peripancreatic stranding and fluid compared to 03/29/2024. 6.4 cm pseudocyst in the pancreatic tail is unchanged. No ductal dilation. No evidence of pancreatic necrosis. Spleen: Unremarkable. Adrenals/Urinary Tract: Unremarkable. Stomach/Bowel: Reactive inflammation of the duodenum. No bowel obstruction. Normal appendix. Vascular/Lymphatic: No significant vascular findings are present. No enlarged abdominal or pelvic lymph nodes. Reproductive: Unremarkable. Other: No free intraperitoneal air. Subcutaneous gas in the left abdominal wall likely due to injections. Musculoskeletal: No acute fracture. IMPRESSION: 1. Similar to slight decrease in pancreatitis compared to 03/29/2024. 2. Unchanged 6.4 cm pseudocyst in the pancreatic tail. No evidence of pancreatic necrosis. 3. Hepatic steatosis. Electronically Signed   By: Norman Gatlin M.D.   On: 05/13/2024 03:25   Assessment / Plan: 38 year old male with a history of hypertriglyceridemia and recurrent acute pancreatitis with pseudocyst admitted with N/V and epigastric/LUQ pain. CTAP with contrast similar to improved from previous.  Pancreatitis present secondary to pancreatitis previously, but he has been compliant with all of his medications and he had a triglyceride level a month ago  as an outpatient with normal level of triglycerides.  Question if the pancreatitis is actually because of the high triglycerides on this occasion rather than vice versa.   Prior history of gallstones s/p robotic assisted laparoscopic cholecystectomy as an outpatient which was  performed by Dr. Vickie 07/05/2023   Hepatic steatosis, elevated LFTs. LFTs continue to drift downward/normalized currently. Acute hepatitis panel negative. Started on Fenofibrate  and Atorvastatin  7/7. CTAP 7/6 showed diffuse hepatic steatosis.   DM type II on insulin     - Will send genetic testing for a PRSS, Spink 1, and CFTR mutation. - Possible EUS with fluid sampling of the cystic lesion on 8/22. - Otherwise continue supportive care with IV fluids, pain control, etc.  Continue insulin  drip and D5 LR as appropriate (looks like they are stopping the insulin  gtt). - Leave on clear liquids for now.   LOS: 2 days   Harlene BIRCH. Shawntelle Ungar  05/14/2024, 9:25 AM

## 2024-05-14 NOTE — Plan of Care (Signed)
  Problem: Clinical Measurements: Goal: Ability to maintain clinical measurements within normal limits will improve Outcome: Progressing Goal: Diagnostic test results will improve Outcome: Progressing   Problem: Nutrition: Goal: Adequate nutrition will be maintained Outcome: Progressing   Problem: Pain Managment: Goal: General experience of comfort will improve and/or be controlled Outcome: Progressing

## 2024-05-14 NOTE — Progress Notes (Signed)
  Progress Note   Patient: George Howe FMW:[id number] DOB: 1986/03/04 DOA: 05/12/2024     2 DOS: the patient was seen and examined on 05/14/2024   Brief hospital course: 38 y.o. male with medical history significant of with past medical history significant for recurrent pancreatitis with frequent admissions, last admission was 7/25, chronic transaminitis and hyperbilirubinemia, pancreatic pseudocyst, insulin -dependent type 2 diabetes, anxiety and depression, hyper triglyceridemia who presents to the ED for symptoms of upper left-sided abdominal pain with associated nausea and vomiting.  Symptoms were similar to his previous episodes of pancreatitis.  Symptoms started this morning.  He reports compliance with taking his medications including Lipitor and fenofibrate .  He does not report any fever, chills, chest pain, shortness of breath, urinary symptoms.   Assessment and Plan: #1.  Recurrent pancreatitis, possibly related to hypertriglyceridemia. -Presenting TG remained elevated in excess of 2k, now down to 170's with insulin  gtt. Will d/c gtt -continues with marked epigastric pain on minimal palpation -GI following, initial plan was possible EUS, however given continued symptoms, this may be held -If pt is unable to advance diet by tomorrow, may need coretrak TF   #2.  Insulin -dependent type 2 diabetes.   -now on SSI as needed   #3.  Elevated blood pressure.   -BP stable -cont PRN hydralazine  for now   #4.  Mild transaminitis/mild hyperbilirubinemia.  Appears to be a chronic issue for the patient.  -cont to follow LFT trends   #5.  Obesity. -recommend diet/lifestyle modification      Subjective: continues having marked epigastric and some LUQ pains  Physical Exam: Vitals:   05/14/24 1200 05/14/24 1300 05/14/24 1400 05/14/24 1500  BP: 122/64   130/77  Pulse: 78 81 85 77  Resp: 12 12 12 15   Temp: 98.2 F (36.8 C)     TempSrc: Oral     SpO2: 93% 97% 93% 95%  Weight:       Height:       General exam: Conversant, in no acute distress Respiratory system: normal chest rise, clear, no audible wheezing Cardiovascular system: regular rhythm, s1-s2 Gastrointestinal system: Nondistended, tenderness over epigastric region, pos BS Central nervous system: No seizures, no tremors Extremities: No cyanosis, no joint deformities Skin: No rashes, no pallor Psychiatry: Affect normal // no auditory hallucinations   Data Reviewed:  Labs reviewed: Na 135, K 3.6, Cr 0.82, AST 89, ALT 78, TB 2.2  Family Communication: Pt in room, family not at bedside  Disposition: Status is: Inpatient Remains inpatient appropriate because: severity of illness  Planned Discharge Destination: Home    Author: Garnette Pelt, MD 05/14/2024 3:39 PM  For on call review [email or web address].

## 2024-05-14 NOTE — Plan of Care (Signed)
 Patient being transfer to 1307 via wheelchair, pain medication given prior to transport, both IV's flushed and patent, skin intact, all belongings with patient including his phone and charger, all medications for this evening have been given, vitals within normal limits and patient has no other complaints at this time.  Problem: Education: Goal: Knowledge of General Education information will improve Description: Including pain rating scale, medication(s)/side effects and non-pharmacologic comfort measures Outcome: Progressing   Problem: Health Behavior/Discharge Planning: Goal: Ability to manage health-related needs will improve Outcome: Progressing   Problem: Clinical Measurements: Goal: Ability to maintain clinical measurements within normal limits will improve Outcome: Progressing Goal: Will remain free from infection Outcome: Progressing Goal: Diagnostic test results will improve Outcome: Progressing Goal: Respiratory complications will improve Outcome: Progressing Goal: Cardiovascular complication will be avoided Outcome: Progressing   Problem: Activity: Goal: Risk for activity intolerance will decrease Outcome: Progressing   Problem: Nutrition: Goal: Adequate nutrition will be maintained Outcome: Progressing   Problem: Coping: Goal: Level of anxiety will decrease Outcome: Progressing   Problem: Elimination: Goal: Will not experience complications related to bowel motility Outcome: Progressing Goal: Will not experience complications related to urinary retention Outcome: Progressing   Problem: Pain Managment: Goal: General experience of comfort will improve and/or be controlled Outcome: Progressing   Problem: Safety: Goal: Ability to remain free from injury will improve Outcome: Progressing   Problem: Skin Integrity: Goal: Risk for impaired skin integrity will decrease Outcome: Progressing   Problem: Education: Goal: Ability to describe self-care measures  that may prevent or decrease complications (Diabetes Survival Skills Education) will improve Outcome: Progressing Goal: Individualized Educational Video(s) Outcome: Progressing   Problem: Coping: Goal: Ability to adjust to condition or change in health will improve Outcome: Progressing   Problem: Fluid Volume: Goal: Ability to maintain a balanced intake and output will improve Outcome: Progressing   Problem: Health Behavior/Discharge Planning: Goal: Ability to identify and utilize available resources and services will improve Outcome: Progressing Goal: Ability to manage health-related needs will improve Outcome: Progressing   Problem: Metabolic: Goal: Ability to maintain appropriate glucose levels will improve Outcome: Progressing   Problem: Nutritional: Goal: Maintenance of adequate nutrition will improve Outcome: Progressing Goal: Progress toward achieving an optimal weight will improve Outcome: Progressing   Problem: Skin Integrity: Goal: Risk for impaired skin integrity will decrease Outcome: Progressing   Problem: Tissue Perfusion: Goal: Adequacy of tissue perfusion will improve Outcome: Progressing

## 2024-05-14 NOTE — Telephone Encounter (Signed)
===  View-only below this line=== ----- Message ----- From: Cara Elida HERO, NP Sent: 05/14/2024   7:43 AM EDT To: Naomie LOISE Sharps, RN; Ellouise Console, PA-C  Dottie, timing of repeat imaging to be determined by inpatient GI service at this juncture. Ellouise, looks like patient was previously scheduled for an office visit with you 9/3 therefore I am adding you to this message. Patient will likely require evaluation by pancreas specialist, previously referred to [facility] but had not been seen prior to this hospital admission.    Dottie, cancel 9/11 recall imaging. Thank you for excellent update.  Colleen ----- Message ----- From: Sharps Naomie LOISE, RN Sent: 05/13/2024   8:30 AM EDT To: Elida HERO Cara, NP  Elida- You requested this patient have CT abd/pelvis pancreatic protocol 2 months from previous hospitalization (would be due around 06/04/24); it appears patient is currently hospitalized and actually had a CT yesterday. Would this suffice? Defer to hospital team for follow up imaging recommendation?

## 2024-05-14 NOTE — Progress Notes (Signed)
 Hypoglycemic Event  CBG: 68  Treatment: D50 25 mL (12.5 gm)  Symptoms: Shaky  Follow-up CBG: Time:0431 CBG Result:133   Possible Reasons for Event: Inadequate meal intake and Other: patient remains NPO on insulin  gtt   Comments/MD notified:Abigail Andrez, NP notified. Instructed RN to turn off insulin  gtt and administer 1/2 amp of D50     Markie Frith N Genowefa Morga

## 2024-05-15 ENCOUNTER — Encounter (HOSPITAL_COMMUNITY)
Admission: EM | Disposition: A | Discharge status: Home / Self Care | Payer: Self-pay | Source: Home / Self Care | Attending: Internal Medicine

## 2024-05-15 DIAGNOSIS — K859 Acute pancreatitis without necrosis or infection, unspecified: Secondary | ICD-10-CM | POA: Diagnosis not present

## 2024-05-15 DIAGNOSIS — E440 Moderate protein-calorie malnutrition: Secondary | ICD-10-CM

## 2024-05-15 DIAGNOSIS — E781 Pure hyperglyceridemia: Secondary | ICD-10-CM | POA: Diagnosis not present

## 2024-05-15 LAB — COMPREHENSIVE METABOLIC PANEL WITH GFR
ALT: 93 U/L — ABNORMAL HIGH (ref 0–44)
AST: 67 U/L — ABNORMAL HIGH (ref 15–41)
Albumin: 3.4 g/dL — ABNORMAL LOW (ref 3.5–5.0)
Alkaline Phosphatase: 69 U/L (ref 38–126)
Anion gap: 11 (ref 5–15)
BUN: <5 mg/dL — ABNORMAL LOW (ref 6–20)
CO2: 25 mmol/L (ref 22–32)
Calcium: 8.8 mg/dL — ABNORMAL LOW (ref 8.9–10.3)
Chloride: 101 mmol/L (ref 98–111)
Creatinine, Ser: 1.03 mg/dL (ref 0.61–1.24)
GFR, Estimated: >60 mL/min (ref >60)
Glucose, Bld: 158 mg/dL — ABNORMAL HIGH (ref 70–99)
Potassium: 4.3 mmol/L (ref 3.5–5.1)
Sodium: 137 mmol/L (ref 135–145)
Total Bilirubin: 3.4 mg/dL — ABNORMAL HIGH (ref 0.0–1.2)
Total Protein: 6.2 g/dL — ABNORMAL LOW (ref 6.5–8.1)

## 2024-05-15 LAB — GLUCOSE, CAPILLARY
Glucose-Capillary: 106 mg/dL — ABNORMAL HIGH (ref 70–99)
Glucose-Capillary: 156 mg/dL — ABNORMAL HIGH (ref 70–99)
Glucose-Capillary: 165 mg/dL — ABNORMAL HIGH (ref 70–99)
Glucose-Capillary: 183 mg/dL — ABNORMAL HIGH (ref 70–99)
Glucose-Capillary: 186 mg/dL — ABNORMAL HIGH (ref 70–99)
Glucose-Capillary: 189 mg/dL — ABNORMAL HIGH (ref 70–99)

## 2024-05-15 LAB — LIPASE, BLOOD: Lipase: 34 U/L (ref 11–51)

## 2024-05-15 LAB — TRIGLYCERIDES
Triglycerides: 138 mg/dL (ref <150)
Triglycerides: 126 mg/dL (ref <150)

## 2024-05-15 LAB — C-REACTIVE PROTEIN: CRP: 0.6 mg/dL (ref <1.0)

## 2024-05-15 LAB — SEDIMENTATION RATE: Sed Rate: 8 mm/h (ref 0–16)

## 2024-05-15 SURGERY — ULTRASOUND, UPPER GI TRACT, ENDOSCOPIC
Anesthesia: Monitor Anesthesia Care

## 2024-05-15 NOTE — Progress Notes (Signed)
     [facility] Progress Note  CC:  Acute pancreatitis   Subjective:  Lipase is normal and CRP is normal but he reports no improvement in his pain.  Still requiring the same amount of pain medication.  Had an episode of vomiting yesterday.  Total bili up slightly at 3.4 today.  Objective:  Vital signs in last 24 hours: Temp:  [97.9 F (36.6 C)-98.8 F (37.1 C)] 98.8 F (37.1 C) (08/22 0852) Pulse Rate:  [73-89] 78 (08/22 0852) Resp:  [10-19] 18 (08/22 0852) BP: (107-151)/(60-90) 118/72 (08/22 0852) SpO2:  [91 %-100 %] 98 % (08/22 0852) Last BM Date : 05/14/24 General:  Alert, Well-developed, in NAD Heart:  Regular rate and rhythm; no murmurs Pulm:  CTAB.  No W/R/R. Abdomen:  Soft, non-distended.  BS present.  Diffuse TTP> in the upper abdomen to even light palpation. Extremities:  Without edema. Neurologic:  Alert and oriented x 4;  grossly normal neurologically. Psych:  Alert and cooperative. Normal mood and affect.  Intake/Output from previous day: 08/21 0701 - 08/22 0700 In: 1607.5 [P.O.:730; I.V.:877.5] Out: 0   Lab Results: Recent Labs    05/12/24 1239 05/12/24 2139 05/13/24 0326  WBC 10.2 11.2* 9.5  HGB 17.6* 15.8 14.6  HCT 49.8 45.1 40.9  PLT 287 238 219   BMET Recent Labs    05/13/24 0326 05/14/24 1019 05/15/24 0754  NA 135 135 137  K 3.2* 3.6 4.3  CL 106 105 101  CO2 22 22 25   GLUCOSE 105* 121* 158*  BUN 18 5* <5*  CREATININE 0.90 0.82 1.03  CALCIUM  8.3* 8.3* 8.8*   LFT Recent Labs    05/15/24 0754  PROT 6.2*  ALBUMIN 3.4*  AST 67*  ALT 93*  ALKPHOS 69  BILITOT 3.4*   Assessment / Plan: 38 year old male with a history of hypertriglyceridemia and recurrent acute pancreatitis with pseudocyst admitted with N/V and epigastric/LUQ pain. CTAP with contrast similar to improved from previous.  Pancreatitis present secondary to pancreatitis previously, but he has been compliant with all of his medications and he had a triglyceride  level a month ago as an outpatient with normal level of triglycerides.  Question if the pancreatitis is actually because of the high triglycerides on this occasion rather than vice versa.   Prior history of gallstones s/p robotic assisted laparoscopic cholecystectomy as an outpatient which was performed by Dr. Vickie 07/05/2023   Hepatic steatosis, elevated LFTs. LFTs continue to drift downward/normalized currently. Acute hepatitis panel negative. Started on Fenofibrate  and Atorvastatin  7/7. CTAP 7/6 showed diffuse hepatic steatosis.   DM type II on insulin      - Await genetic testing for a PRSS, Spink 1, and CFTR mutation. - Continue supportive care with IV fluids, pain control, etc. - Leave on clear liquids for now. - No EUS today.  Will defer as outpatient.   LOS: 3 days   Harlene BIRCH. Marranda Arakelian  05/15/2024, 9:27 AM

## 2024-05-15 NOTE — Progress Notes (Signed)
  Progress Note   Patient: George Howe FMW:[id number] DOB: 31-Mar-1986 DOA: 05/12/2024     3 DOS: the patient was seen and examined on 05/15/2024   Brief hospital course: 38 y.o. male with medical history significant of with past medical history significant for recurrent pancreatitis with frequent admissions, last admission was 7/25, chronic transaminitis and hyperbilirubinemia, pancreatic pseudocyst, insulin -dependent type 2 diabetes, anxiety and depression, hyper triglyceridemia who presents to the ED for symptoms of upper left-sided abdominal pain with associated nausea and vomiting.  Symptoms were similar to his previous episodes of pancreatitis.  Symptoms started this morning.  He reports compliance with taking his medications including Lipitor and fenofibrate .  He does not report any fever, chills, chest pain, shortness of breath, urinary symptoms.   Assessment and Plan: #1.  Recurrent pancreatitis, possibly related to hypertriglyceridemia. -Presenting TG remained elevated in excess of 2k, now improved with insulin  gtt -continues with marked epigastric pain on minimal palpation -GI following, continue with supportive care.  -Genetic testing is under way   #2.  Insulin -dependent type 2 diabetes.   -now on SSI as needed   #3.  Elevated blood pressure.   -BP stable -cont PRN hydralazine  for now   #4.  Mild transaminitis/mild hyperbilirubinemia.  Appears to be a chronic issue for the patient.  -LFT's remain mildly elevated   #5.  Obesity. -recommend diet/lifestyle modification      Subjective: Still complaining of epigastric pain  Physical Exam: Vitals:   05/15/24 0200 05/15/24 0413 05/15/24 0852 05/15/24 1345  BP: 127/85 117/69 118/72 122/86  Pulse: 77 73 78 86  Resp: 16 16 18 16   Temp: 98.4 F (36.9 C) 98.6 F (37 C) 98.8 F (37.1 C) 98.5 F (36.9 C)  TempSrc: Oral Oral Oral Oral  SpO2: 100% 98% 98% 99%  Weight:      Height:       General exam: Awake, laying in bed,  in nad Respiratory system: Normal respiratory effort, no wheezing Cardiovascular system: regular rate, s1, s2 Gastrointestinal system: Soft, nondistended, positive BS Central nervous system: CN2-12 grossly intact, strength intact Extremities: Perfused, no clubbing Skin: Normal skin turgor, no notable skin lesions seen Psychiatry: Mood normal // no visual hallucinations   Data Reviewed:  Labs reviewed: Na 137, K 4.3, Cr 1.03, TG 138  Family Communication: Pt in room, family not at bedside  Disposition: Status is: Inpatient Remains inpatient appropriate because: severity of illness  Planned Discharge Destination: Home    Author: Garnette Pelt, MD 05/15/2024 3:03 PM  For on call review [email or web address].

## 2024-05-15 NOTE — Plan of Care (Signed)

## 2024-05-16 DIAGNOSIS — E781 Pure hyperglyceridemia: Secondary | ICD-10-CM | POA: Diagnosis not present

## 2024-05-16 DIAGNOSIS — K859 Acute pancreatitis without necrosis or infection, unspecified: Secondary | ICD-10-CM | POA: Diagnosis not present

## 2024-05-16 LAB — MAGNESIUM: Magnesium: 1.8 mg/dL (ref 1.7–2.4)

## 2024-05-16 LAB — COMPREHENSIVE METABOLIC PANEL WITH GFR
ALT: 79 U/L — ABNORMAL HIGH (ref 0–44)
AST: 43 U/L — ABNORMAL HIGH (ref 15–41)
Albumin: 3.4 g/dL — ABNORMAL LOW (ref 3.5–5.0)
Alkaline Phosphatase: 78 U/L (ref 38–126)
Anion gap: 6 (ref 5–15)
BUN: 8 mg/dL (ref 6–20)
CO2: 28 mmol/L (ref 22–32)
Calcium: 9 mg/dL (ref 8.9–10.3)
Chloride: 104 mmol/L (ref 98–111)
Creatinine, Ser: 1.06 mg/dL (ref 0.61–1.24)
GFR, Estimated: >60 mL/min (ref >60)
Glucose, Bld: 121 mg/dL — ABNORMAL HIGH (ref 70–99)
Potassium: 4 mmol/L (ref 3.5–5.1)
Sodium: 138 mmol/L (ref 135–145)
Total Bilirubin: 3.7 mg/dL — ABNORMAL HIGH (ref 0.0–1.2)
Total Protein: 6 g/dL — ABNORMAL LOW (ref 6.5–8.1)

## 2024-05-16 LAB — GLUCOSE, CAPILLARY
Glucose-Capillary: 136 mg/dL — ABNORMAL HIGH (ref 70–99)
Glucose-Capillary: 141 mg/dL — ABNORMAL HIGH (ref 70–99)
Glucose-Capillary: 146 mg/dL — ABNORMAL HIGH (ref 70–99)
Glucose-Capillary: 165 mg/dL — ABNORMAL HIGH (ref 70–99)
Glucose-Capillary: 235 mg/dL — ABNORMAL HIGH (ref 70–99)
Glucose-Capillary: 270 mg/dL — ABNORMAL HIGH (ref 70–99)
Glucose-Capillary: 85 mg/dL (ref 70–99)

## 2024-05-16 LAB — CBC
HCT: 43.6 % (ref 39.0–52.0)
Hemoglobin: 14.1 g/dL (ref 13.0–17.0)
MCH: 27.8 pg (ref 26.0–34.0)
MCHC: 32.3 g/dL (ref 30.0–36.0)
MCV: 86 fL (ref 80.0–100.0)
Platelets: 184 K/uL (ref 150–400)
RBC: 5.07 MIL/uL (ref 4.22–5.81)
RDW: 12.6 % (ref 11.5–15.5)
WBC: 6.9 K/uL (ref 4.0–10.5)
nRBC: 0 % (ref 0.0–0.2)

## 2024-05-16 NOTE — Plan of Care (Signed)
 ?  Problem: Clinical Measurements: ?Goal: Will remain free from infection ?Outcome: Progressing ?  ?

## 2024-05-16 NOTE — Plan of Care (Signed)

## 2024-05-16 NOTE — Progress Notes (Signed)
  Progress Note   Patient: George Howe FMW:[id number] DOB: 04-02-1986 DOA: 05/12/2024     4 DOS: the patient was seen and examined on 05/16/2024   Brief hospital course: 38 y.o. male with medical history significant of with past medical history significant for recurrent pancreatitis with frequent admissions, last admission was 7/25, chronic transaminitis and hyperbilirubinemia, pancreatic pseudocyst, insulin -dependent type 2 diabetes, anxiety and depression, hyper triglyceridemia who presents to the ED for symptoms of upper left-sided abdominal pain with associated nausea and vomiting.  Symptoms were similar to his previous episodes of pancreatitis.  Symptoms started this morning.  He reports compliance with taking his medications including Lipitor and fenofibrate .  He does not report any fever, chills, chest pain, shortness of breath, urinary symptoms.   Assessment and Plan: #1.  Recurrent pancreatitis, possibly related to hypertriglyceridemia. -Presenting TG remained elevated in excess of 2k, now improved with insulin  gtt -continues with epigastric pain on palpation, maybe somewhat better, but still quite symptomatic -GI following, continue with supportive care.  -Genetic testing is under way -continued on clears   #2.  Insulin -dependent type 2 diabetes.   -now on SSI as needed   #3.  Elevated blood pressure.   -BP stable -cont PRN hydralazine  for now   #4.  Mild transaminitis/mild hyperbilirubinemia.  Appears to be a chronic issue for the patient.  -LFT's remain mildly elevated   #5.  Obesity. -recommend diet/lifestyle modification      Subjective: Continues with epigastric pain, not yet ready to advance diet  Physical Exam: Vitals:   05/16/24 0159 05/16/24 0356 05/16/24 0947 05/16/24 1317  BP: 110/78 121/71 120/85 117/76  Pulse: 81 74 81 79  Resp: 16 16 20 18   Temp: 98.6 F (37 C) 98.2 F (36.8 C) 98.5 F (36.9 C) 98.9 F (37.2 C)  TempSrc: Oral Oral  Oral  SpO2: 97%  97% 98% 97%  Weight:      Height:       General exam: Conversant, in no acute distress Respiratory system: normal chest rise, clear, no audible wheezing Cardiovascular system: regular rhythm, s1-s2 Gastrointestinal system: Nondistended, epigastric pain, pos BS Central nervous system: No seizures, no tremors Extremities: No cyanosis, no joint deformities Skin: No rashes, no pallor Psychiatry: Affect normal // no auditory hallucinations   Data Reviewed:  Labs reviewed: Na 138, K 4.0, Cr 1.06, WBC 6.9, Hgb 14.1, Plts 184  Family Communication: Pt in room, family not at bedside  Disposition: Status is: Inpatient Remains inpatient appropriate because: severity of illness  Planned Discharge Destination: Home    Author: Garnette Pelt, MD 05/16/2024 1:43 PM  For on call review [email or web address].

## 2024-05-17 DIAGNOSIS — E781 Pure hyperglyceridemia: Secondary | ICD-10-CM | POA: Diagnosis not present

## 2024-05-17 DIAGNOSIS — K859 Acute pancreatitis without necrosis or infection, unspecified: Secondary | ICD-10-CM | POA: Diagnosis not present

## 2024-05-17 LAB — COMPREHENSIVE METABOLIC PANEL WITH GFR
ALT: 64 U/L — ABNORMAL HIGH (ref 0–44)
AST: 25 U/L (ref 15–41)
Albumin: 3.6 g/dL (ref 3.5–5.0)
Alkaline Phosphatase: 82 U/L (ref 38–126)
Anion gap: 10 (ref 5–15)
BUN: 8 mg/dL (ref 6–20)
CO2: 26 mmol/L (ref 22–32)
Calcium: 9 mg/dL (ref 8.9–10.3)
Chloride: 101 mmol/L (ref 98–111)
Creatinine, Ser: 1.09 mg/dL (ref 0.61–1.24)
GFR, Estimated: >60 mL/min (ref >60)
Glucose, Bld: 160 mg/dL — ABNORMAL HIGH (ref 70–99)
Potassium: 4.3 mmol/L (ref 3.5–5.1)
Sodium: 137 mmol/L (ref 135–145)
Total Bilirubin: 3.1 mg/dL — ABNORMAL HIGH (ref 0.0–1.2)
Total Protein: 6.3 g/dL — ABNORMAL LOW (ref 6.5–8.1)

## 2024-05-17 LAB — GLUCOSE, CAPILLARY
Glucose-Capillary: 145 mg/dL — ABNORMAL HIGH (ref 70–99)
Glucose-Capillary: 166 mg/dL — ABNORMAL HIGH (ref 70–99)
Glucose-Capillary: 312 mg/dL — ABNORMAL HIGH (ref 70–99)
Glucose-Capillary: 140 mg/dL — ABNORMAL HIGH (ref 70–99)
Glucose-Capillary: 275 mg/dL — ABNORMAL HIGH (ref 70–99)

## 2024-05-17 LAB — LIPASE, BLOOD: Lipase: 34 U/L (ref 11–51)

## 2024-05-17 MED ORDER — INSULIN DEGLUDEC 100 UNIT/ML ~~LOC~~ SOPN: 20.0000 [IU] | PEN_INJECTOR | SUBCUTANEOUS | Status: DC

## 2024-05-17 MED ORDER — INSULIN GLARGINE 100 UNIT/ML ~~LOC~~ SOLN
20.0000 [IU] | Freq: Every day | SUBCUTANEOUS | Status: DC
  Administered 2024-05-17: 20 [IU] via SUBCUTANEOUS
  Filled 2024-05-17: qty 0.2

## 2024-05-17 NOTE — Plan of Care (Signed)
   Problem: Health Behavior/Discharge Planning: Goal: Ability to manage health-related needs will improve Outcome: Progressing

## 2024-05-17 NOTE — Plan of Care (Signed)
 Patient remains alert and oriented, verbal of needs. Pain managed with prn medications. Ambulated independently in hallway. IV remains patent, flushes with no difficulty noted.  Problem: Activity: Goal: Risk for activity intolerance will decrease Outcome: Progressing   Problem: Coping: Goal: Level of anxiety will decrease Outcome: Progressing   Problem: Elimination: Goal: Will not experience complications related to bowel motility Outcome: Progressing Goal: Will not experience complications related to urinary retention Outcome: Progressing   Problem: Safety: Goal: Ability to remain free from injury will improve Outcome: Progressing   Problem: Skin Integrity: Goal: Risk for impaired skin integrity will decrease Outcome: Progressing   Problem: Education: Goal: Ability to describe self-care measures that may prevent or decrease complications (Diabetes Survival Skills Education) will improve Outcome: Progressing Goal: Individualized Educational Video(s) Outcome: Progressing

## 2024-05-17 NOTE — Plan of Care (Signed)
  Problem: Education: Goal: Knowledge of General Education information will improve Description: Including pain rating scale, medication(s)/side effects and non-pharmacologic comfort measures Outcome: Progressing   Problem: Clinical Measurements: Goal: Ability to maintain clinical measurements within normal limits will improve Outcome: Progressing Goal: Will remain free from infection Outcome: Progressing Goal: Diagnostic test results will improve Outcome: Progressing Goal: Respiratory complications will improve Outcome: Progressing Goal: Cardiovascular complication will be avoided Outcome: Progressing   Problem: Activity: Goal: Risk for activity intolerance will decrease Outcome: Progressing   Problem: Nutrition: Goal: Adequate nutrition will be maintained Outcome: Progressing   Problem: Coping: Goal: Level of anxiety will decrease Outcome: Progressing   Problem: Tissue Perfusion: Goal: Adequacy of tissue perfusion will improve Outcome: Progressing   Problem: Skin Integrity: Goal: Risk for impaired skin integrity will decrease Outcome: Progressing

## 2024-05-17 NOTE — Progress Notes (Signed)
  Progress Note   Patient: George Howe FMW:[id number] DOB: 12/15/1985 DOA: 05/12/2024     5 DOS: the patient was seen and examined on 05/17/2024   Brief hospital course: 38 y.o. male with medical history significant of with past medical history significant for recurrent pancreatitis with frequent admissions, last admission was 7/25, chronic transaminitis and hyperbilirubinemia, pancreatic pseudocyst, insulin -dependent type 2 diabetes, anxiety and depression, hyper triglyceridemia who presents to the ED for symptoms of upper left-sided abdominal pain with associated nausea and vomiting.  Symptoms were similar to his previous episodes of pancreatitis.  Symptoms started this morning.  He reports compliance with taking his medications including Lipitor and fenofibrate .  He does not report any fever, chills, chest pain, shortness of breath, urinary symptoms.   Assessment and Plan: #1.  Recurrent pancreatitis, possibly related to hypertriglyceridemia. -Presenting TG remained elevated in excess of 2k, now improved with insulin  gtt -GI following, continue with supportive care.  -Genetic testing is under way, f/u as outpt -symptoms are improving. Pain much improved -advancing diet   #2.  Insulin -dependent type 2 diabetes.   -now on SSI as needed -given improving PO intake. Will resume 1/2 of home long-acting insuling dose and titrate accordingly   #3.  Elevated blood pressure.   -BP stable -cont PRN hydralazine  for now   #4.  Mild transaminitis/mild hyperbilirubinemia.  Appears to be a chronic issue for the patient.  -LFT's remain mildly elevated   #5.  Obesity. -recommend diet/lifestyle modification      Subjective: Abd pain improved, feeling better  Physical Exam: Vitals:   05/16/24 1317 05/16/24 1950 05/17/24 0412 05/17/24 1322  BP: 117/76 118/88 115/78 135/85  Pulse: 79 81 65 77  Resp: 18 16 16 20   Temp: 98.9 F (37.2 C) 99.6 F (37.6 C) 98.3 F (36.8 C) 98.8 F (37.1 C)   TempSrc: Oral Oral Oral Oral  SpO2: 97% 100% 99% 100%  Weight:      Height:       General exam: Awake, laying in bed, in nad Respiratory system: Normal respiratory effort, no wheezing Cardiovascular system: regular rate, s1, s2 Gastrointestinal system: Soft, nondistended, positive BS Central nervous system: CN2-12 grossly intact, strength intact Extremities: Perfused, no clubbing Skin: Normal skin turgor, no notable skin lesions seen Psychiatry: Mood normal // no visual hallucinations   Data Reviewed:  Labs reviewed: Na 137, K 4.3, Cr 1.09  Family Communication: Pt in room, family not at bedside  Disposition: Status is: Inpatient Remains inpatient appropriate because: severity of illness  Planned Discharge Destination: Home    Author: Garnette Pelt, MD 05/17/2024 4:19 PM  For on call review [email or web address]

## 2024-05-17 NOTE — Progress Notes (Signed)
 [facility] ROUNDING NOTE   Subjective: Abdominal pain and pancreatitis symptoms - History of pancreatitis with multiple hospitalizations - Experienced some pain yesterday, but less severe than previous episodes - Gradual improvement in pain symptoms - Previous setbacks when advancing diet too quickly  Dietary tolerance and progression - Able to tolerate clear liquids without issues yesterday - Currently progressing to full liquid diet, with plans to advance to soft foods if tolerated - Inquired about differences between full liquid and soft diets; full liquids include protein shakes - Drinking plenty of water  Pain management and discharge concerns - Previous discharges without adequate pain medication, resulting in withdrawal symptoms due to prolonged Dilaudid  use - Concerned about ensuring appropriate pain management prior to discharge   Objective: Vital signs in last 24 hours: Temp:  [98.3 F (36.8 C)-99.6 F (37.6 C)] 98.3 F (36.8 C) (08/24 0412) Pulse Rate:  [65-81] 65 (08/24 0412) Resp:  [16-18] 16 (08/24 0412) BP: (115-118)/(76-88) 115/78 (08/24 0412) SpO2:  [97 %-100 %] 99 % (08/24 0412) Last BM Date : 05/16/24 GENERAL: Alert, cooperative, well developed, no acute distress. HEENT: Normocephalic, normal oropharynx, moist mucous membranes. CHEST: Clear to auscultation bilaterally, no wheezes, rhonchi, or crackles. CARDIOVASCULAR: Normal heart rate and rhythm, S1 and S2 normal without murmurs. ABDOMEN: Soft, non-tender, non-distended, without organomegaly, normal bowel sounds. EXTREMITIES: No cyanosis or edema. NEUROLOGICAL: Cranial nerves grossly intact, moves all extremities without gross motor or sensory deficit.    Intake/Output from previous day: 08/23 0701 - 08/24 0700 In: 1960 [P.O.:1960] Out: 0  Intake/Output this shift: No intake/output data recorded.   Lab Results: Recent Labs    05/16/24 0546  WBC 6.9  HGB 14.1  PLT 184  MCV 86.0    BMET Recent Labs    05/15/24 0754 05/16/24 0546 05/17/24 0441  NA 137 138 137  K 4.3 4.0 4.3  CL 101 104 101  CO2 25 28 26   GLUCOSE 158* 121* 160*  BUN <5* 8 8  CREATININE 1.03 1.06 1.09  CALCIUM  8.8* 9.0 9.0   LFT Recent Labs    05/15/24 0754 05/16/24 0546 05/17/24 0441  PROT 6.2* 6.0* 6.3*  ALBUMIN 3.4* 3.4* 3.6  AST 67* 43* 25  ALT 93* 79* 64*  ALKPHOS 69 78 82  BILITOT 3.4* 3.7* 3.1*   PT/INR No results for input(s): INR in the last 72 hours.    Imaging/Other results: No results found.    Assessment &Plan  38 year old male with a history of hypertriglyceridemia and recurrent acute pancreatitis with pseudocyst admitted with N/V and epigastric/LUQ pain.   Acute pancreatitis Acute pancreatitis with improvement in symptoms. He reports better tolerance of clear liquids and is ready to advance to full liquids. The condition is improving but requires careful dietary progression to avoid exacerbation.  He had experienced setbacks in the past due to rapid dietary advancement. - Advance diet to full liquids for breakfast and assess tolerance. - If full liquids are tolerated, advance to soft foods for lunch. - Instruct on dietary choices: avoid heavy, greasy, or hard-to-digest foods; focus on bland, easy-to-digest options like grits, egg whites, and bananas. - Ensure adequate hydration to prevent dehydration. - Discuss pain management plan with hospitalist to ensure appropriate discharge medications. - Plan discharge if dietary advancement is tolerated and pain is manageable with oral medications.  - F/u genetic testing for a PRSS, Spink 1, and CFTR mutation.  Will arrange outpatient GI follow-up with GI office with Dr. Abran or APP.  Please advise patient  to call GI office next week for appointment   This visit required >35 minutes of patient care (this includes precharting, chart review, review of results, face-to-face time used for counseling as well as  treatment plan and follow-up. The patient was provided an opportunity to ask questions and all were answered. The patient agreed with the plan and demonstrated an understanding of the instructions.  LOIS Wilkie Mcgee , MD [phone]  [facility]

## 2024-05-18 ENCOUNTER — Other Ambulatory Visit (HOSPITAL_BASED_OUTPATIENT_CLINIC_OR_DEPARTMENT_OTHER): Payer: Self-pay

## 2024-05-18 ENCOUNTER — Other Ambulatory Visit (HOSPITAL_COMMUNITY): Payer: Self-pay

## 2024-05-18 DIAGNOSIS — K859 Acute pancreatitis without necrosis or infection, unspecified: Secondary | ICD-10-CM | POA: Diagnosis not present

## 2024-05-18 DIAGNOSIS — R1012 Left upper quadrant pain: Secondary | ICD-10-CM

## 2024-05-18 DIAGNOSIS — E781 Pure hyperglyceridemia: Secondary | ICD-10-CM | POA: Diagnosis not present

## 2024-05-18 LAB — COMPREHENSIVE METABOLIC PANEL WITH GFR
ALT: 51 U/L — ABNORMAL HIGH (ref 0–44)
AST: 22 U/L (ref 15–41)
Albumin: 3.4 g/dL — ABNORMAL LOW (ref 3.5–5.0)
Alkaline Phosphatase: 83 U/L (ref 38–126)
Anion gap: 6 (ref 5–15)
BUN: 12 mg/dL (ref 6–20)
CO2: 24 mmol/L (ref 22–32)
Calcium: 8.8 mg/dL — ABNORMAL LOW (ref 8.9–10.3)
Chloride: 106 mmol/L (ref 98–111)
Creatinine, Ser: 0.98 mg/dL (ref 0.61–1.24)
GFR, Estimated: >60 mL/min (ref >60)
Glucose, Bld: 142 mg/dL — ABNORMAL HIGH (ref 70–99)
Potassium: 3.8 mmol/L (ref 3.5–5.1)
Sodium: 136 mmol/L (ref 135–145)
Total Bilirubin: 1.5 mg/dL — ABNORMAL HIGH (ref 0.0–1.2)
Total Protein: 6.3 g/dL — ABNORMAL LOW (ref 6.5–8.1)

## 2024-05-18 LAB — GLUCOSE, CAPILLARY
Glucose-Capillary: 150 mg/dL — ABNORMAL HIGH (ref 70–99)
Glucose-Capillary: 150 mg/dL — ABNORMAL HIGH (ref 70–99)
Glucose-Capillary: 158 mg/dL — ABNORMAL HIGH (ref 70–99)
Glucose-Capillary: 277 mg/dL — ABNORMAL HIGH (ref 70–99)
Glucose-Capillary: 235 mg/dL — ABNORMAL HIGH (ref 70–99)

## 2024-05-18 LAB — CBC
HCT: 43.4 % (ref 39.0–52.0)
Hemoglobin: 14.3 g/dL (ref 13.0–17.0)
MCH: 28 pg (ref 26.0–34.0)
MCHC: 32.9 g/dL (ref 30.0–36.0)
MCV: 84.9 fL (ref 80.0–100.0)
Platelets: 208 K/uL (ref 150–400)
RBC: 5.11 MIL/uL (ref 4.22–5.81)
RDW: 12.3 % (ref 11.5–15.5)
WBC: 7.9 K/uL (ref 4.0–10.5)
nRBC: 0 % (ref 0.0–0.2)

## 2024-05-18 MED ORDER — OXYCODONE HCL 5 MG PO TABS
5.0000 mg | ORAL_TABLET | Freq: Four times a day (QID) | ORAL | 0 refills | Status: DC | PRN
  Filled 2024-05-18: qty 15, 4d supply, fill #0

## 2024-05-18 MED ORDER — INSULIN GLARGINE 100 UNIT/ML ~~LOC~~ SOLN
25.0000 [IU] | Freq: Every day | SUBCUTANEOUS | Status: DC
  Filled 2024-05-18: qty 0.25

## 2024-05-18 MED ORDER — ATORVASTATIN CALCIUM 40 MG PO TABS
40.0000 mg | ORAL_TABLET | Freq: Every day | ORAL | 0 refills | Status: DC
  Filled 2024-05-18: qty 90, 90d supply, fill #0

## 2024-05-18 MED ORDER — FENOFIBRATE 160 MG PO TABS
160.0000 mg | ORAL_TABLET | Freq: Every day | ORAL | 0 refills | Status: DC
  Filled 2024-05-18: qty 90, 90d supply, fill #0

## 2024-05-18 MED ORDER — ONDANSETRON HCL 4 MG PO TABS
4.0000 mg | ORAL_TABLET | Freq: Four times a day (QID) | ORAL | 0 refills | Status: DC | PRN
  Filled 2024-05-18: qty 20, 5d supply, fill #0

## 2024-05-18 NOTE — Progress Notes (Addendum)
 [facility] Progress Note  CC: Recurrent pancreatitis with pancreatic pseudocyst and hypertriglyceridemia  Subjective: He endorsed having increased LUQ pain yesterday after advancing to soft diet without nausea or vomiting. He ate eggs, potatoes, yogurt and a banana this morning for breakfast without any further abdominal pain. He passed a nonbloody loose stool earlier this morning, stated his stool usually firm up once he resumes a normal diet. No chest pain or shortness of breath.   Objective:  Vital signs in last 24 hours: Temp:  [98.4 F (36.9 C)-99.3 F (37.4 C)] 98.4 F (36.9 C) (08/25 0516) Pulse Rate:  [74-79] 74 (08/25 0516) Resp:  [16-20] 16 (08/25 0516) BP: (95-135)/(68-85) 95/68 (08/25 0516) SpO2:  [97 %-100 %] 99 % (08/25 0516) Last BM Date : 05/18/24 General: Alert 38 year old male in no acute distress. Heart: Regular rate and rhythm, no murmurs. Pulm: Breath sounds clear throughout.  On room air. Abdomen: Soft, nondistended. Nontender. Positive bowel sounds to all 4 quadrants. No palpable mass. Extremities:  No edema. Neurologic:  Alert and oriented x 4. Grossly normal neurologically. Psych:  Alert and cooperative. Normal mood and affect.  Intake/Output from previous day: 08/24 0701 - 08/25 0700 In: 1560 [P.O.:1560] Out: -  Intake/Output this shift: No intake/output data recorded.  Lab Results: Recent Labs    05/16/24 0546 05/18/24 0449  WBC 6.9 7.9  HGB 14.1 14.3  HCT 43.6 43.4  PLT 184 208   BMET Recent Labs    05/16/24 0546 05/17/24 0441 05/18/24 0449  NA 138 137 136  K 4.0 4.3 3.8  CL 104 101 106  CO2 28 26 24   GLUCOSE 121* 160* 142*  BUN 8 8 12   CREATININE 1.06 1.09 0.98  CALCIUM  9.0 9.0 8.8*   LFT Recent Labs    05/18/24 0449  PROT 6.3*  ALBUMIN 3.4*  AST 22  ALT 51*  ALKPHOS 83  BILITOT 1.5*   PT/INR No results for input(s): LABPROT, INR in the last 72 hours. Hepatitis Panel No results for input(s):  HEPBSAG, HCVAB, HEPAIGM, HEPBIGM in the last 72 hours.  No results found.  Patient Profile: 38 year old male with a past medical history of anxiety, depression, diabetes mellitus type 2 on insulin  and frequent hospital admission secondary to recurrent acute pancreatitis with pancreatic pseudocyst. Readmitted 05/12/2024 with N/V and worsening LUQ pain.  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 Lipase 123 -> 186. Triglycerides 2,117 -> received insulin  IV -> triglycerides 433  -> 235 -> 154 -> 164 -> 127 -> 138. CRP 0.6. Sed rate 8.  Mildly elevated LFTs. CTAP with contrast 8/20 showed similar to slight decrease in peripancreatic stranding and fluid compared to prior CT 7/6 and the 6.4 cm pseudocyst in the pancreatic tail was unchanged without evidence of PD dilation or pancreatic necrosis. No alcohol use. Low/normal calcium  levels. Normal IgG4 level 7/8. Adherent with taking Lipitor and Fenofibrate  at home. No known family history of pancreatic disease or cystic fibrosis. He tolerated a soft diet for breakfast this morning without any further LUQ pain.  Hemodynamically stable. - Soft diet as tolerated  - Await genetic testing: PRS S1, Spink 1, CFTR gene mutations (our office staff contacted [facility], these test results can take 28 - 30 days) - Patient to keep previously scheduled appointment in our GI clinic with Ellouise Console, PA-C 9/3 - Eventual EUS with Dr. Wilhelmenia  for pancreatic pseudocyst fluid sampling to rule out neoplastic process and to rule out retained choledocholithiasis as an outpatient, timing to be determined - Pain management per the hospitalist - Await further recommendations per Dr. Legrand   Prior history of gallstones s/p robotic assisted laparoscopic cholecystectomy as an outpatient by Dr. Vickie 07/05/2023   Hepatic  steatosis, elevated LFTs. AST 22. ALT 51. Acute hepatitis panel negative. Started on Fenofibrate  and Atorvastatin  7/7.   DM type II on insulin    Principal Problem:   Acute pancreatitis due to hypertriglyceridemia Active Problems:   Insulin  dependent type 2 diabetes mellitus (HCC)   GAD (generalized anxiety disorder)   History of cholecystectomy   Left upper quadrant abdominal pain   Generalized abdominal pain   Recurrent acute pancreatitis   Moderate protein-calorie malnutrition (HCC)     LOS: 6 days   Elida HERO Kennedy-Smith  05/18/2024, 9:52 AM  I have taken an interval history, thoroughly reviewed the chart and examined the patient. I agree with the Advanced Practitioner's note, impression and recommendations, and have recorded additional findings, impressions and recommendations below. I performed a substantive portion of this encounter (>50% time spent), including a complete performance of the medical decision making.  My additional thoughts are as follows:  Signout received from colleagues and chart review performed.  He looks well, abdominal exam is benign, and he appears stable for discharge if he tolerates his midday meal shortly without vomiting or any increase in abdominal pain.  Some genetic testing is pending and there are plans for potential outpatient EUS as noted above. _________________  This consultation required a moderate degree of medical decision making due to the nature and complexity of the acute condition(s) being evaluated as well as the patient's medical comorbidities.  Victory LITTIE Legrand III [phone]

## 2024-05-18 NOTE — Progress Notes (Signed)
 Discharge meds in a secure bag delivered to pt in room by this RN

## 2024-05-18 NOTE — Discharge Summary (Signed)
 Physician Discharge Summary   Patient: George Howe MRN: [id number] DOB: Feb 16, 1986  Admit date:     05/12/2024  Discharge date: 05/18/24  Discharge Physician: Garnette Pelt   PCP: Theophilus Andrews, Tully GRADE, MD   Recommendations at discharge:    Follow up with PCP in 1-2 weeks Follow up with GI as scheduled  Discharge Diagnoses: Principal Problem:   Acute pancreatitis due to hypertriglyceridemia Active Problems:   Insulin  dependent type 2 diabetes mellitus (HCC)   History of cholecystectomy   GAD (generalized anxiety disorder)   Left upper quadrant abdominal pain   Generalized abdominal pain   Recurrent acute pancreatitis   Moderate protein-calorie malnutrition (HCC)  Resolved Problems:   * No resolved hospital problems. *  Hospital Course: 38 y.o. male with medical history significant of with past medical history significant for recurrent pancreatitis with frequent admissions, last admission was 7/25, chronic transaminitis and hyperbilirubinemia, pancreatic pseudocyst, insulin -dependent type 2 diabetes, anxiety and depression, hyper triglyceridemia who presents to the ED for symptoms of upper left-sided abdominal pain with associated nausea and vomiting.  Symptoms were similar to his previous episodes of pancreatitis.  Symptoms started this morning.  He reports compliance with taking his medications including Lipitor and fenofibrate .  He does not report any fever, chills, chest pain, shortness of breath, urinary symptoms.   Assessment and Plan: #1.  Recurrent pancreatitis, possibly related to hypertriglyceridemia. -Presenting TG remained elevated in excess of 2k, now improved with insulin  gtt -GI following, continue with supportive care.  -Genetic testing is under way, f/u as outpt -symptoms are improving. Pain much improved -successfully advanced diet   #2.  Insulin -dependent type 2 diabetes.   -now on SSI as needed -given improving PO intake. Gradually resume long-acting  insulin     #3.  Elevated blood pressure.   -BP stable   #4.  Mild transaminitis/mild hyperbilirubinemia.  Appears to be a chronic issue for the patient.  -LFT's remain mildly elevated but stable   #5.  Obesity. -recommend diet/lifestyle modification    Consultants: GI Procedures performed:   Disposition: Home Diet recommendation:  Regular diet DISCHARGE MEDICATION: Allergies as of 05/18/2024   No Known Allergies      Medication List     PAUSE taking these medications    Tresiba  FlexTouch 100 UNIT/ML FlexTouch Pen Wait to take this until your doctor or other care provider tells you to start again. Generic drug: insulin  degludec Inject 40 Units into the skin daily.       TAKE these medications    atorvastatin  40 MG tablet Commonly known as: LIPITOR Take 1 tablet (40 mg total) by mouth daily.   buPROPion  150 MG 24 hr tablet Commonly known as: Wellbutrin  XL Take 1 tablet (150 mg total) by mouth daily.   cetirizine  10 MG tablet Commonly known as: ZYRTEC  Take 1 tablet (10 mg total) by mouth daily as needed for allergies.   fenofibrate  160 MG tablet Take 1 tablet (160 mg total) by mouth daily.   FreeStyle Libre 3 Plus Sensor Misc Change sensor every 15 days.   Gvoke HypoPen  1-Pack 1 MG/0.2ML Soaj Generic drug: Glucagon  1 mg (1 mL) injected subcutaneously or intramuscularly into the upper arm, thigh, or buttocks, or intravenously for hypoglycemia   insulin  lispro 100 UNIT/ML KwikPen Commonly known as: HumaLOG  KwikPen Inject up to 60 Units into the skin daily.   metFORMIN  500 MG 24 hr tablet Commonly known as: GLUCOPHAGE -XR Take 2 tablets (1,000 mg total) by mouth daily with breakfast.  multivitamin with minerals tablet Take 1 tablet by mouth daily.   nicotine  14 mg/24hr patch Commonly known as: NICODERM CQ  - dosed in mg/24 hours Place 1 patch (14 mg total) onto the skin daily.   ondansetron  4 MG tablet Commonly known as: ZOFRAN  Take 1 tablet (4 mg  total) by mouth every 6 (six) hours as needed for nausea.   oxycodone  5 MG capsule Commonly known as: OXY-IR Take 1 capsule (5 mg total) by mouth every 6 (six) hours as needed for pain.   TechLite Pen Needles 32G X 4 MM Misc Generic drug: Insulin  Pen Needle Use in the morning, at noon, in the evening, and at bedtime        Discharge Exam: Filed Weights   05/12/24 2017 05/12/24 2117  Weight: 88 kg 90.7 kg   General exam: Awake, laying in bed, in nad Respiratory system: Normal respiratory effort, no wheezing Cardiovascular system: regular rate, s1, s2 Gastrointestinal system: Soft, nondistended, positive BS Central nervous system: CN2-12 grossly intact, strength intact Extremities: Perfused, no clubbing Skin: Normal skin turgor, no notable skin lesions seen Psychiatry: Mood normal // no visual hallucinations   Condition at discharge: fair  The results of significant diagnostics from this hospitalization (including imaging, microbiology, ancillary and laboratory) are listed below for reference.   Imaging Studies: CT ABDOMEN PELVIS W CONTRAST Result Date: 05/13/2024 CLINICAL DATA:  Recurrent pancreatitis, mid abdominal pain, nausea, vomiting EXAM: CT ABDOMEN AND PELVIS WITH CONTRAST TECHNIQUE: Multidetector CT imaging of the abdomen and pelvis was performed using the standard protocol following bolus administration of intravenous contrast. RADIATION DOSE REDUCTION: This exam was performed according to the departmental dose-optimization program which includes automated exposure control, adjustment of the mA and/or kV according to patient size and/or use of iterative reconstruction technique. CONTRAST:  OMNIPAQUE  IOHEXOL  300 MG/ML  SOLN COMPARISON:  03/29/2024 FINDINGS: Lower chest: No acute abnormality. Hepatobiliary: Hepatic steatosis. Cholecystectomy. No biliary dilation. Pancreas: Similar to slight decrease in peripancreatic stranding and fluid compared to 03/29/2024. 6.4 cm  pseudocyst in the pancreatic tail is unchanged. No ductal dilation. No evidence of pancreatic necrosis. Spleen: Unremarkable. Adrenals/Urinary Tract: Unremarkable. Stomach/Bowel: Reactive inflammation of the duodenum. No bowel obstruction. Normal appendix. Vascular/Lymphatic: No significant vascular findings are present. No enlarged abdominal or pelvic lymph nodes. Reproductive: Unremarkable. Other: No free intraperitoneal air. Subcutaneous gas in the left abdominal wall likely due to injections. Musculoskeletal: No acute fracture. IMPRESSION: 1. Similar to slight decrease in pancreatitis compared to 03/29/2024. 2. Unchanged 6.4 cm pseudocyst in the pancreatic tail. No evidence of pancreatic necrosis. 3. Hepatic steatosis. Electronically Signed   By: Norman Gatlin M.D.   On: 05/13/2024 03:25    Microbiology: Results for orders placed or performed during the hospital encounter of 05/12/24  MRSA Next Gen by PCR, Nasal     Status: None   Collection Time: 05/12/24  9:38 PM   Specimen: Nasal Mucosa; Nasal Swab  Result Value Ref Range Status   MRSA by PCR Next Gen NOT DETECTED NOT DETECTED Final    Comment: (NOTE) The GeneXpert MRSA Assay (FDA approved for NASAL specimens only), is one component of a comprehensive MRSA colonization surveillance program. It is not intended to diagnose MRSA infection nor to guide or monitor treatment for MRSA infections. Test performance is not FDA approved in patients less than 29 years old. Performed at [facility], [address], [place], [place] [address]     Labs: CBC: Recent Labs  Lab 05/12/24 1239 05/12/24 2139  05/13/24 0326 05/16/24 0546 05/18/24 0449  WBC 10.2 11.2* 9.5 6.9 7.9  NEUTROABS 7.6  --   --   --   --   HGB 17.6* 15.8 14.6 14.1 14.3  HCT 49.8 45.1 40.9 43.6 43.4  MCV 80.5 80.7 81.6 86.0 84.9  PLT 287 238 219 184 208   Basic Metabolic Panel: Recent Labs  Lab 05/14/24 1019 05/15/24 0754 05/16/24 0546  05/17/24 0441 05/18/24 0449  NA 135 137 138 137 136  K 3.6 4.3 4.0 4.3 3.8  CL 105 101 104 101 106  CO2 22 25 28 26 24   GLUCOSE 121* 158* 121* 160* 142*  BUN 5* <5* 8 8 12   CREATININE 0.82 1.03 1.06 1.09 0.98  CALCIUM  8.3* 8.8* 9.0 9.0 8.8*  MG  --   --  1.8  --   --    Liver Function Tests: Recent Labs  Lab 05/14/24 1019 05/15/24 0754 05/16/24 0546 05/17/24 0441 05/18/24 0449  AST 89* 67* 43* 25 22  ALT 78* 93* 79* 64* 51*  ALKPHOS 57 69 78 82 83  BILITOT 2.2* 3.4* 3.7* 3.1* 1.5*  PROT 5.9* 6.2* 6.0* 6.3* 6.3*  ALBUMIN 3.2* 3.4* 3.4* 3.6 3.4*   CBG: Recent Labs  Lab 05/17/24 1654 05/17/24 2047 05/18/24 0059 05/18/24 0515 05/18/24 0722  GLUCAP 140* 275* 150* 150* 158*    Discharge time spent: less than 30 minutes.  Signed: Garnette Pelt, MD [facility] 05/18/2024

## 2024-05-18 NOTE — Progress Notes (Signed)
 Patient was given discharge instructions, and all questions were answered. Patient was stable for discharge was walk to the main exit.

## 2024-05-19 ENCOUNTER — Telehealth: Payer: Self-pay

## 2024-05-19 ENCOUNTER — Other Ambulatory Visit (HOSPITAL_BASED_OUTPATIENT_CLINIC_OR_DEPARTMENT_OTHER): Payer: Self-pay

## 2024-05-19 NOTE — Transitions of Care (Post Inpatient/ED Visit) (Unsigned)
   05/19/2024  Name: George Howe MRN: [id number] DOB: Jan 29, 1986  Today's TOC FU Call Status: Today's TOC FU Call Status:: Unsuccessful Call (1st Attempt) Unsuccessful Call (1st Attempt) Date: 05/19/24  Attempted to reach the patient regarding the most recent Inpatient/ED visit.  Follow Up Plan: Additional outreach attempts will be made to reach the patient to complete the Transitions of Care (Post Inpatient/ED visit) call.   Signature Julian Lemmings, LPN [facility] Nurse Health Advisor Direct Dial  [phone]

## 2024-05-20 NOTE — Transitions of Care (Post Inpatient/ED Visit) (Unsigned)
   05/20/2024  Name: George Howe MRN: [id number] DOB: 01-27-1986  Today's TOC FU Call Status: Today's TOC FU Call Status:: Unsuccessful Call (2nd Attempt) Unsuccessful Call (1st Attempt) Date: 05/19/24 Unsuccessful Call (2nd Attempt) Date: 05/20/24  Attempted to reach the patient regarding the most recent Inpatient/ED visit.  Follow Up Plan: Additional outreach attempts will be made to reach the patient to complete the Transitions of Care (Post Inpatient/ED visit) call.   Signature Julian Lemmings, LPN [facility] Nurse Health Advisor Direct Dial  [phone]

## 2024-05-21 NOTE — Transitions of Care (Post Inpatient/ED Visit) (Signed)
   05/21/2024  Name: George Howe MRN: [id number] DOB: 1986-07-07  Today's TOC FU Call Status: Today's TOC FU Call Status:: Unsuccessful Call (3rd Attempt) Unsuccessful Call (1st Attempt) Date: 05/19/24 Unsuccessful Call (2nd Attempt) Date: 05/20/24 Unsuccessful Call (3rd Attempt) Date: 05/21/24  Attempted to reach the patient regarding the most recent Inpatient/ED visit.  Follow Up Plan: No further outreach attempts will be made at this time. We have been unable to contact the patient.  Signature Julian Lemmings, LPN [facility] Nurse Health Advisor Direct Dial  [phone]

## 2024-05-26 ENCOUNTER — Other Ambulatory Visit (HOSPITAL_COMMUNITY): Payer: Self-pay

## 2024-05-26 ENCOUNTER — Ambulatory Visit: Admitting: Internal Medicine

## 2024-05-26 ENCOUNTER — Other Ambulatory Visit (HOSPITAL_BASED_OUTPATIENT_CLINIC_OR_DEPARTMENT_OTHER): Payer: Self-pay

## 2024-05-26 ENCOUNTER — Other Ambulatory Visit: Payer: Self-pay

## 2024-05-26 ENCOUNTER — Encounter: Payer: Self-pay | Admitting: Internal Medicine

## 2024-05-26 VITALS — BP 110/80 | HR 70 | Temp 98.4°F | Wt 197.8 lb

## 2024-05-26 DIAGNOSIS — Z09 Encounter for follow-up examination after completed treatment for conditions other than malignant neoplasm: Secondary | ICD-10-CM | POA: Diagnosis not present

## 2024-05-26 DIAGNOSIS — K858 Other acute pancreatitis without necrosis or infection: Secondary | ICD-10-CM | POA: Diagnosis not present

## 2024-05-26 DIAGNOSIS — E782 Mixed hyperlipidemia: Secondary | ICD-10-CM

## 2024-05-26 DIAGNOSIS — E119 Type 2 diabetes mellitus without complications: Secondary | ICD-10-CM | POA: Diagnosis not present

## 2024-05-26 DIAGNOSIS — E781 Pure hyperglyceridemia: Secondary | ICD-10-CM | POA: Diagnosis not present

## 2024-05-26 DIAGNOSIS — Z794 Long term (current) use of insulin: Secondary | ICD-10-CM

## 2024-05-26 MED ORDER — FREESTYLE LIBRE 3 PLUS SENSOR MISC
5 refills | Status: AC
  Filled 2024-05-26 – 2024-05-31 (×2): qty 2, 30d supply, fill #0
  Filled 2024-06-29: qty 2, 30d supply, fill #1
  Filled 2024-07-27: qty 2, 30d supply, fill #2
  Filled 2024-08-15 – 2024-08-18 (×3): qty 2, 30d supply, fill #3
  Filled 2024-09-07 – 2024-09-08 (×2): qty 2, 30d supply, fill #4
  Filled 2024-10-07 – 2024-10-15 (×2): qty 2, 30d supply, fill #5
  Filled ????-??-??: fill #4

## 2024-05-26 NOTE — Progress Notes (Unsigned)
 Ellouise Console, PA-C [address] [place], [place]  [address] Phone: [phone]   Primary Care Physician: Theophilus Andrews, Tully GRADE, MD  Primary Gastroenterologist:  Ellouise Console, PA-C / Norleen Kiang, MD / Dr. Wilhelmenia  Chief Complaint: Hospital follow-up recurrent pancreatitis, hypertriglyceridemia       HPI:   George Howe is a 38 y.o. male presents for hospital follow-up of recurrent pancreatitis with frequent admissions, chronic transaminitis and hyperbilirubinemia, pancreatic pseudocyst, insulin -dependent type 2 diabetes, anxiety, depression, and hypertriglyceridemia.  Most recently admitted 05/12/2024 until 05/18/2024. Recurrent pancreatitis was thought possibly related to hypertriglyceridemia.  Initial triglyceride 2,117.  Received IV insulin .  Triglyceride improved to 127.  Mildly elevated LFTs.  No evidence of pancreatic necrosis.  Normal IgG4 level.  Currently taking Lipitor and fenofibrate .  No family history of pancreatic disease.  He underwent genetic testing in hospital, results pending.  He underwent cholecystectomy for cholelithiasis 06/2023.    First episode of pancreatitis  around 2021-2022 and triglycerides were in the 2000's.  Cigarette smoker.  Rare alcohol use.  Current symptoms: Since hospital discharge he has not had any more abdominal pain.  He eats normally and is tolerating food well.  He has weaned off all pain medications.  He denies diarrhea, nausea, vomiting, abdominal pain, or any other GI symptoms.  He is ready to reschedule EUS procedure with Dr. Wilhelmenia.  EUS was previously scheduled when he was in hospital, however procedure was postponed until pancreatitis calmed down.  Patient is eating low-fat diet.  He works as a Investment banker, operational.  He is compliant with taking Lipitor and fenofibrate  daily.  Last drink of alcohol was 12/2023.  Currently smokes cigarettes.   Current Outpatient Medications  Medication Sig Dispense Refill   atorvastatin  (LIPITOR) 40 MG  tablet Take 1 tablet (40 mg total) by mouth daily. 90 tablet 0   buPROPion  (WELLBUTRIN  XL) 150 MG 24 hr tablet Take 1 tablet (150 mg total) by mouth daily. 90 tablet 1   cetirizine  (ZYRTEC ) 10 MG tablet Take 1 tablet (10 mg total) by mouth daily as needed for allergies. 90 tablet 0   Continuous Glucose Sensor (FREESTYLE LIBRE 3 PLUS SENSOR) MISC Change sensor every 15 days. 2 each 5   fenofibrate  160 MG tablet Take 1 tablet (160 mg total) by mouth daily. 90 tablet 0   Glucagon  1 MG/0.2ML SOAJ 1 mg (1 mL) injected subcutaneously or intramuscularly into the upper arm, thigh, or buttocks, or intravenously for hypoglycemia 0.2 mL 2   [Paused] insulin  degludec (TRESIBA  FLEXTOUCH) 100 UNIT/ML FlexTouch Pen Inject 40 Units into the skin daily. 45 mL 2   insulin  lispro (HUMALOG  KWIKPEN) 100 UNIT/ML KwikPen Inject up to 60 Units into the skin daily. 60 mL 2   Insulin  Pen Needle (PEN NEEDLES) 32G X 4 MM MISC Use in the morning, at noon, in the evening, and at bedtime 400 each 3   metFORMIN  (GLUCOPHAGE -XR) 500 MG 24 hr tablet Take 2 tablets (1,000 mg total) by mouth daily with breakfast. 180 tablet 3   Multiple Vitamins-Minerals (MULTIVITAMIN WITH MINERALS) tablet Take 1 tablet by mouth daily.     ondansetron  (ZOFRAN ) 4 MG tablet Take 1 tablet (4 mg total) by mouth every 6 (six) hours as needed for nausea. 20 tablet 0   oxyCODONE  (OXY IR/ROXICODONE ) 5 MG immediate release tablet Take 1 tablet (5 mg total) by mouth every 6 (six) hours as needed. (Patient not taking: Reported on 05/27/2024) 15 tablet 0   No  current facility-administered medications for this visit.    Allergies as of 05/27/2024   (No Known Allergies)    Past Medical History:  Diagnosis Date   Abnormal CT of the abdomen    Abscess of left axilla    Acute pancreatitis    Anxiety    Bilateral carpal tunnel syndrome    Calculus of gallbladder without cholecystitis without obstruction    Chicken pox    Cholelithiasis    Cigarette nicotine   dependence without complication    Depression    Diabetes mellitus without complication (HCC)    Type 2   Dizziness    DM (diabetes mellitus) (HCC)    Elevated liver function tests    Gall stones    Hay fever    Hyperlipidemia    Hypertriglyceridemia    Morbid obesity (HCC)    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   Pancreatic necrosis    Pancreatitis    PONV (postoperative nausea and vomiting)     Past Surgical History:  Procedure Laterality Date   CARPAL TUNNEL RELEASE Right 10/18/2021   Procedure: Right CARPAL TUNNEL RELEASE;  Surgeon: Romona Harari, MD;  Location: [facility];  Service: Orthopedics;  Laterality: Right;   CARPAL TUNNEL RELEASE Left 11/01/2021   Procedure: Left CARPAL TUNNEL RELEASE;  Surgeon: Romona Harari, MD;  Location: [facility];  Service: Orthopedics;  Laterality: Left;   REFRACTIVE SURGERY  1995    Review of Systems:    All systems reviewed and negative except where noted in HPI.    Physical Exam:  BP 118/80   Pulse 77   Ht 5' 2 (1.575 m)   Wt 200 lb 8 oz (90.9 kg)   BMI 36.67 kg/m  No LMP for male patient.  General: Well-nourished, well-developed in no acute distress.  Lungs: Clear to auscultation bilaterally. Non-labored. Heart: Regular rate and rhythm, no murmurs rubs or gallops.  Abdomen: Bowel sounds are normal; Abdomen is Soft; No hepatosplenomegaly, masses or hernias;  No Abdominal Tenderness; No guarding or rebound tenderness. Neuro: Alert and oriented x 3.  Grossly intact.  Psych: Alert and cooperative, normal mood and affect.   Imaging Studies: CT ABDOMEN PELVIS W CONTRAST Result Date: 05/13/2024 CLINICAL DATA:  Recurrent pancreatitis, mid abdominal pain, nausea, vomiting EXAM: CT ABDOMEN AND PELVIS WITH CONTRAST TECHNIQUE: Multidetector CT imaging of the abdomen and pelvis was performed using the standard protocol following bolus administration of intravenous contrast. RADIATION DOSE REDUCTION: This exam was  performed according to the departmental dose-optimization program which includes automated exposure control, adjustment of the mA and/or kV according to patient size and/or use of iterative reconstruction technique. CONTRAST:  OMNIPAQUE  IOHEXOL  300 MG/ML  SOLN COMPARISON:  03/29/2024 FINDINGS: Lower chest: No acute abnormality. Hepatobiliary: Hepatic steatosis. Cholecystectomy. No biliary dilation. Pancreas: Similar to slight decrease in peripancreatic stranding and fluid compared to 03/29/2024. 6.4 cm pseudocyst in the pancreatic tail is unchanged. No ductal dilation. No evidence of pancreatic necrosis. Spleen: Unremarkable. Adrenals/Urinary Tract: Unremarkable. Stomach/Bowel: Reactive inflammation of the duodenum. No bowel obstruction. Normal appendix. Vascular/Lymphatic: No significant vascular findings are present. No enlarged abdominal or pelvic lymph nodes. Reproductive: Unremarkable. Other: No free intraperitoneal air. Subcutaneous gas in the left abdominal wall likely due to injections. Musculoskeletal: No acute fracture. IMPRESSION: 1. Similar to slight decrease in pancreatitis compared to 03/29/2024. 2. Unchanged 6.4 cm pseudocyst in the pancreatic tail. No evidence of pancreatic necrosis. 3. Hepatic steatosis. Electronically Signed   By: Norman Gatlin M.D.   On: 05/13/2024 03:25  Labs: CBC    Component Value Date/Time   WBC 7.9 05/18/2024 0449   RBC 5.11 05/18/2024 0449   HGB 14.3 05/18/2024 0449   HCT 43.4 05/18/2024 0449   PLT 208 05/18/2024 0449   MCV 84.9 05/18/2024 0449   MCH 28.0 05/18/2024 0449   MCHC 32.9 05/18/2024 0449   RDW 12.3 05/18/2024 0449   LYMPHSABS 2.0 05/12/2024 1239   MONOABS 0.5 05/12/2024 1239   EOSABS 0.1 05/12/2024 1239   BASOSABS 0.0 05/12/2024 1239    CMP     Component Value Date/Time   NA 136 05/18/2024 0449   K 3.8 05/18/2024 0449   CL 106 05/18/2024 0449   CO2 24 05/18/2024 0449   GLUCOSE 142 (H) 05/18/2024 0449   BUN 12 05/18/2024 0449    CREATININE 0.98 05/18/2024 0449   CALCIUM  8.8 (L) 05/18/2024 0449   PROT 6.3 (L) 05/18/2024 0449   PROT 6.6 06/17/2023 1058   ALBUMIN 3.4 (L) 05/18/2024 0449   ALBUMIN 4.4 06/17/2023 1058   AST 22 05/18/2024 0449   ALT 51 (H) 05/18/2024 0449   ALKPHOS 83 05/18/2024 0449   BILITOT 1.5 (H) 05/18/2024 0449   BILITOT 0.8 06/17/2023 1058   GFRNONAA >60 05/18/2024 0449   Lab 05/14/24 1019 05/15/24 0754 05/16/24 0546 05/17/24 0441 05/18/24 0449  AST 89* 67* 43* 25 22  ALT 78* 93* 79* 64* 51*  ALKPHOS 57 69 78 82 83  BILITOT 2.2* 3.4* 3.7* 3.1* 1.5*  PROT 5.9* 6.2* 6.0* 6.3* 6.3*  ALBUMIN 3.2* 3.4* 3.4* 3.6 3.4*    Component Ref Range & Units (hover) 9 d ago (05/17/24) 11 d ago (05/15/24) 13 d ago (05/13/24) 2 wk ago (05/12/24) 1 mo ago (04/03/24) 1 mo ago (04/02/24) 1 mo ago (04/01/24)  Lipase 34 34 CM 186 High  CM 123 High  CM 46 CM 53 High  CM 78 High      Assessment and Plan:   George Howe is a 38 y.o. 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  Initial triglyceride 2117.  Received IV insulin .  Triglyceride improved to 127.  Mildly elevated LFTs.  No evidence of pancreatic necrosis.  Patient denies alcohol use.  Normal IgG4 level.  Currently taking Lipitor and fenofibrate .  No family history of pancreatic disease.  Current cigarette smoking.  Prior cholecystectomy 06/2023.  Genetic testing for pancreatitis results pending.  1.  Recurrent acute pancreatitis: Currently asymptomatic.  No current abdominal pain. 2.  Pancreatic pseudocyst (pancreas tail) 3.  Hypertriglyceridemia 4.  Mildly elevated LFTs 5.  Hepatic steatosis  Plan: - Lab: BMP, lipase, triglycerides, hepatic panel - Continue low-fat diet. - Await genetic testing (collected 05/13/2024 in hospital): PRS S1, Spink 1, CFTR gene mutations (our office staff contacted [facility], these test results can take 28 - 30  days to result).  Results still pending. - Schedule EUS with Dr. Wilhelmenia for pancreatic pseudocyst fluid sampling to rule out neoplastic process and to rule out retained choledocholithiasis as an outpatient. - Avoid alcohol  - Encouraged smoking cessation. - Continue Lipitor and fenofibrate  to treat hypertriglyceridemia.  Patient was discussed with Dr. Wilhelmenia in office today to help decide patient's plan of care.  Ellouise Console, PA-C  Follow up with Dr. Abran in 2 months.

## 2024-05-26 NOTE — Progress Notes (Signed)
 Established Patient Office Visit     CC/Reason for Visit: Hospital follow-up  HPI: George Howe is a 38 y.o. male who is coming in today for the above mentioned reasons. Past Medical History is significant for: Recurrent pancreatitis presumably due to hypertriglyceridemia, hyperlipidemia, insulin -dependent diabetes, obesity.  He was admitted to the hospital from August 19 through May 18, 2024 for abdominal pain found to be recurrent pancreatitis.  He tolerated increased diet.  Has appointment with GI tomorrow.  He is currently feeling great, he is back to work and eating regular meals.   Past Medical/Surgical History: Past Medical History:  Diagnosis Date   Abnormal CT of the abdomen    Abscess of left axilla    Acute pancreatitis    Anxiety    Bilateral carpal tunnel syndrome    Calculus of gallbladder without cholecystitis without obstruction    Chicken pox    Cholelithiasis    Cigarette nicotine  dependence without complication    Depression    Diabetes mellitus without complication (HCC)    Type 2   Dizziness    DM (diabetes mellitus) (HCC)    Elevated liver function tests    Gall stones    Hay fever    Hyperlipidemia    Hypertriglyceridemia    Morbid obesity (HCC)    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   Pancreatic necrosis    Pancreatitis    PONV (postoperative nausea and vomiting)     Past Surgical History:  Procedure Laterality Date   CARPAL TUNNEL RELEASE Right 10/18/2021   Procedure: Right CARPAL TUNNEL RELEASE;  Surgeon: Romona Harari, MD;  Location: [facility];  Service: Orthopedics;  Laterality: Right;   CARPAL TUNNEL RELEASE Left 11/01/2021   Procedure: Left CARPAL TUNNEL RELEASE;  Surgeon: Romona Harari, MD;  Location: [facility];  Service: Orthopedics;  Laterality: Left;   REFRACTIVE SURGERY  1995    Social History:  reports that he has been smoking cigarettes. He has a 18.5 pack-year smoking history. He has been exposed  to tobacco smoke. He has never used smokeless tobacco. He reports that he does not currently use alcohol. He reports current drug use. Drug: Marijuana.  Allergies: No Known Allergies  Family History:  Family History  Problem Relation Age of Onset   Diabetes Father    Diabetes Paternal Grandmother      Current Outpatient Medications:    atorvastatin  (LIPITOR) 40 MG tablet, Take 1 tablet (40 mg total) by mouth daily., Disp: 90 tablet, Rfl: 0   buPROPion  (WELLBUTRIN  XL) 150 MG 24 hr tablet, Take 1 tablet (150 mg total) by mouth daily., Disp: 90 tablet, Rfl: 1   cetirizine  (ZYRTEC ) 10 MG tablet, Take 1 tablet (10 mg total) by mouth daily as needed for allergies., Disp: 90 tablet, Rfl: 0   fenofibrate  160 MG tablet, Take 1 tablet (160 mg total) by mouth daily., Disp: 90 tablet, Rfl: 0   [Paused] insulin  degludec (TRESIBA  FLEXTOUCH) 100 UNIT/ML FlexTouch Pen, Inject 40 Units into the skin daily., Disp: 45 mL, Rfl: 2   insulin  lispro (HUMALOG  KWIKPEN) 100 UNIT/ML KwikPen, Inject up to 60 Units into the skin daily., Disp: 60 mL, Rfl: 2   Insulin  Pen Needle (PEN NEEDLES) 32G X 4 MM MISC, Use in the morning, at noon, in the evening, and at bedtime, Disp: 400 each, Rfl: 3   metFORMIN  (GLUCOPHAGE -XR) 500 MG 24 hr tablet, Take 2 tablets (1,000 mg total) by mouth daily with breakfast., Disp: 180 tablet, Rfl: 3  Multiple Vitamins-Minerals (MULTIVITAMIN WITH MINERALS) tablet, Take 1 tablet by mouth daily., Disp: , Rfl:    ondansetron  (ZOFRAN ) 4 MG tablet, Take 1 tablet (4 mg total) by mouth every 6 (six) hours as needed for nausea., Disp: 20 tablet, Rfl: 0   Continuous Glucose Sensor (FREESTYLE LIBRE 3 PLUS SENSOR) MISC, Change sensor every 15 days., Disp: 2 each, Rfl: 5   Glucagon  1 MG/0.2ML SOAJ, 1 mg (1 mL) injected subcutaneously or intramuscularly into the upper arm, thigh, or buttocks, or intravenously for hypoglycemia (Patient not taking: Reported on 05/26/2024), Disp: 0.2 mL, Rfl: 2   nicotine   (NICODERM CQ  - DOSED IN MG/24 HOURS) 14 mg/24hr patch, Place 1 patch (14 mg total) onto the skin daily. (Patient not taking: Reported on 05/26/2024), Disp: 28 patch, Rfl: 0   oxyCODONE  (OXY IR/ROXICODONE ) 5 MG immediate release tablet, Take 1 tablet (5 mg total) by mouth every 6 (six) hours as needed., Disp: 15 tablet, Rfl: 0  Review of Systems:  Negative unless indicated in HPI.   Physical Exam: Vitals:   05/26/24 0920  BP: 110/80  Pulse: 70  Temp: 98.4 F (36.9 C)  TempSrc: Oral  SpO2: 98%  Weight: 197 lb 12.8 oz (89.7 kg)    Body mass index is 35.04 kg/m.   Physical Exam Vitals reviewed.  Constitutional:      Appearance: Normal appearance. He is obese.  HENT:     Head: Normocephalic and atraumatic.  Eyes:     Conjunctiva/sclera: Conjunctivae normal.  Cardiovascular:     Rate and Rhythm: Normal rate and regular rhythm.  Pulmonary:     Effort: Pulmonary effort is normal.     Breath sounds: Normal breath sounds.  Skin:    General: Skin is warm and dry.  Neurological:     General: No focal deficit present.     Mental Status: He is alert and oriented to person, place, and time.  Psychiatric:        Mood and Affect: Mood normal.        Behavior: Behavior normal.        Thought Content: Thought content normal.        Judgment: Judgment normal.      Impression and Plan:  Hospital discharge follow-up  Insulin  dependent type 2 diabetes mellitus (HCC) -     FreeStyle Libre 3 Plus Sensor; Change sensor every 15 days.  Dispense: 2 each; Refill: 5  Other acute pancreatitis without infection or necrosis  Hypertriglyceridemia  Mixed hyperlipidemia  Morbid obesity [facility])   [facility] charts reviewed in detail. - He continues on atorvastatin  and fenofibrate . - Has plans to follow with GI tomorrow.  Genetic testing is pending.  To consider EGD with [facility].  Time spent:31 minutes reviewing chart, interviewing and examining patient and formulating plan of  care.     Tully Theophilus Andrews, MD [facility]

## 2024-05-27 ENCOUNTER — Ambulatory Visit: Admitting: Physician Assistant

## 2024-05-27 ENCOUNTER — Other Ambulatory Visit (INDEPENDENT_AMBULATORY_CARE_PROVIDER_SITE_OTHER)

## 2024-05-27 ENCOUNTER — Ambulatory Visit: Payer: Self-pay | Admitting: Physician Assistant

## 2024-05-27 ENCOUNTER — Encounter: Payer: Self-pay | Admitting: Physician Assistant

## 2024-05-27 VITALS — BP 118/80 | HR 77 | Ht 62.0 in | Wt 200.5 lb

## 2024-05-27 DIAGNOSIS — K859 Acute pancreatitis without necrosis or infection, unspecified: Secondary | ICD-10-CM

## 2024-05-27 DIAGNOSIS — F17210 Nicotine dependence, cigarettes, uncomplicated: Secondary | ICD-10-CM

## 2024-05-27 DIAGNOSIS — R7989 Other specified abnormal findings of blood chemistry: Secondary | ICD-10-CM

## 2024-05-27 DIAGNOSIS — K863 Pseudocyst of pancreas: Secondary | ICD-10-CM

## 2024-05-27 DIAGNOSIS — E781 Pure hyperglyceridemia: Secondary | ICD-10-CM

## 2024-05-27 DIAGNOSIS — K760 Fatty (change of) liver, not elsewhere classified: Secondary | ICD-10-CM

## 2024-05-27 LAB — COMPREHENSIVE METABOLIC PANEL WITH GFR
ALT: 44 U/L (ref 0–53)
AST: 27 U/L (ref 0–37)
Albumin: 4.2 g/dL (ref 3.5–5.2)
Alkaline Phosphatase: 66 U/L (ref 39–117)
BUN: 14 mg/dL (ref 6–23)
CO2: 27 meq/L (ref 19–32)
Calcium: 8.9 mg/dL (ref 8.4–10.5)
Chloride: 105 meq/L (ref 96–112)
Creatinine, Ser: 0.94 mg/dL (ref 0.40–1.50)
GFR: 102.99 mL/min (ref >60.00)
Glucose, Bld: 197 mg/dL — ABNORMAL HIGH (ref 70–99)
Potassium: 4.3 meq/L (ref 3.5–5.1)
Sodium: 140 meq/L (ref 135–145)
Total Bilirubin: 0.6 mg/dL (ref 0.2–1.2)
Total Protein: 6.8 g/dL (ref 6.0–8.3)

## 2024-05-27 LAB — TRIGLYCERIDES: Triglycerides: 82 mg/dL (ref 0.0–149.0)

## 2024-05-27 LAB — LIPASE: Lipase: 27 U/L (ref 11.0–59.0)

## 2024-05-27 NOTE — Progress Notes (Signed)
 Noted

## 2024-05-27 NOTE — Patient Instructions (Addendum)
 Your provider has requested that you go to the basement level for lab work before leaving today. Press B on the elevator. The lab is located at the first door on the left as you exit the elevator.  You have been scheduled for an Endoscopy. Please follow written instructions given to you at your visit today.  If you use inhalers (even only as needed), please bring them with you on the day of your procedure.  If you take any of the following medications, they will need to be adjusted prior to your procedure:   DO NOT TAKE 7 DAYS PRIOR TO TEST- Trulicity (dulaglutide) Ozempic, Wegovy (semaglutide) Mounjaro (tirzepatide) Bydureon Bcise (exanatide extended release)  DO NOT TAKE 1 DAY PRIOR TO YOUR TEST Rybelsus (semaglutide) Adlyxin (lixisenatide) Victoza (liraglutide) Byetta (exanatide) ___________________________________________________________________________  Please follow up sooner if symptoms increase or worsen  Due to recent changes in healthcare laws, you may see the results of your imaging and laboratory studies on MyChart before your provider has had a chance to review them.  We understand that in some cases there may be results that are confusing or concerning to you. Not all laboratory results come back in the same time frame and the provider may be waiting for multiple results in order to interpret others.  Please give us  48 hours in order for your provider to thoroughly review all the results before contacting the office for clarification of your results.   Thank you for trusting me with your gastrointestinal care!   Ellouise Console, PA-C _______________________________________________________  If your blood pressure at your visit was 140/90 or greater, please contact your primary care physician to follow up on this.  _______________________________________________________  If you are age 13 or older, your body mass index should be between 23-30. Your Body mass index is  36.67 kg/m. If this is out of the aforementioned range listed, please consider follow up with your Primary Care Provider.  If you are age 44 or younger, your body mass index should be between 19-25. Your Body mass index is 36.67 kg/m. If this is out of the aformentioned range listed, please consider follow up with your Primary Care Provider.   ________________________________________________________  The [facility] providers would like to encourage you to use MYCHART to communicate with providers for non-urgent requests or questions.  Due to long hold times on the telephone, sending your provider a message by [facility] may be a faster and more efficient way to get a response.  Please allow 48 business hours for a response.  Please remember that this is for non-urgent requests.  _______________________________________________________

## 2024-05-28 NOTE — Progress Notes (Signed)
 Attending Physician's Attestation   I have been asked to reviewed this patient's chart, as I know him from recent hospitalization. Glad to hear that he is doing slightly better. Agree with follow-up of genetic testing. He had wanted referral to [facility] medical pancreatology (Dr. Jodean), so would ensure that that referral is still moving forward with advanced endoscopy at [facility]. EUS reasonable to consider, but again important to remember that his initial imaging never showed a pancreatic cyst, so this is more likely a chronic pseudocyst, but aspiration to confirm that it is not an MCN is important since it remains.  Will work on scheduling. Patient to follow-up with primary GI team after EUS.   [name], MD [facility] Advanced Endoscopy Office # [phone]

## 2024-06-01 ENCOUNTER — Other Ambulatory Visit (HOSPITAL_BASED_OUTPATIENT_CLINIC_OR_DEPARTMENT_OTHER): Payer: Self-pay

## 2024-06-02 NOTE — Telephone Encounter (Signed)
===  View-only below this line=== ----- Message ----- From: Anitra Odetta CROME, RN Sent: 06/02/2024  11:23 AM EDT To: Naomie LOISE Sharps, RN Subject: RE: Refer to [facility] (Dr. Jodean)  I have not heard anything- it was done under Dr Abran so maybe Sonny has?? ----- Message ----- From: Sharps Naomie LOISE, RN Sent: 06/02/2024  11:12 AM EDT To: Odetta CROME Anitra, RN Subject: FW: Refer to [facility] (Dr. Jodean)  See 04/06/24 notes; Odetta, has [facility] responded to your fax 04/06/24? ----- Message ----- From: [place], PA-C Sent: 06/02/2024   9:42 AM EDT To: Naomie LOISE Sharps, RN Subject: RE: Refer to [facility] (Dr. Jodean)  I have no idea.  It was per Dr. Wilhelmenia who saw patient in hospital for consult. Thank you, [place], PA-C ----- Message ----- From: Sharps Naomie LOISE, RN Sent: 06/01/2024   8:19 AM EDT To: [place], PA-C Subject: RE: Refer to [facility] (Dr. Jodean)  City- Who was originally asked to refer patient to pancreatology? We will need to find out from that person if the referral has been faxed to [facility] yet. ----- Message ----- From: [place], PA-C Sent: 05/31/2024  10:29 AM EDT To: [facility] Triage Subject: Refer to [facility] (Dr. Jodean)      He had wanted referral to [facility] medical pancreatology (Dr. Jodean), so would ensure that that referral is still moving forward with advanced endoscopy at [facility]. Please check to make sure referral was done. Thank you. [place], PA-C

## 2024-06-02 NOTE — Telephone Encounter (Signed)
 No record of referral at [facility]. I have placed new referral order through [facility] for patient to be scheduled with Dr Jodean.

## 2024-06-17 LAB — MISC LABCORP TEST (SEND OUT): Labcorp test code: 252794

## 2024-06-18 ENCOUNTER — Ambulatory Visit: Payer: Self-pay

## 2024-06-18 NOTE — Telephone Encounter (Signed)
-----   Message from Cornish D. Zehr sent at 06/18/2024 12:10 PM EDT ----- Just FYI, genetic testing was negative.  Nursing staff, please let him know that his genetic testing that was ordered while in the hospital was negative.  He needs to keep his future follow-up. ----- Message ----- From: Rebecka, [facility] [place] Sent: 05/21/2024   2:52 PM EDT To: Jessica D Zehr, PA-C

## 2024-06-18 NOTE — Telephone Encounter (Signed)
 Just FYI, genetic testing was negative.   Nursing staff, please let him know that his genetic testing that was ordered while in the hospital was negative.  He needs to keep his future follow-up.

## 2024-06-29 ENCOUNTER — Other Ambulatory Visit: Payer: Self-pay | Admitting: Internal Medicine

## 2024-06-29 ENCOUNTER — Other Ambulatory Visit (HOSPITAL_BASED_OUTPATIENT_CLINIC_OR_DEPARTMENT_OTHER): Payer: Self-pay

## 2024-06-29 ENCOUNTER — Other Ambulatory Visit: Payer: Self-pay

## 2024-06-29 DIAGNOSIS — F17210 Nicotine dependence, cigarettes, uncomplicated: Secondary | ICD-10-CM

## 2024-06-29 MED ORDER — BUPROPION HCL ER (XL) 150 MG PO TB24
150.0000 mg | ORAL_TABLET | Freq: Every day | ORAL | 1 refills | Status: AC
  Filled 2024-06-29: qty 90, 90d supply, fill #0
  Filled 2024-10-12: qty 90, 90d supply, fill #1

## 2024-07-01 ENCOUNTER — Telehealth: Payer: Self-pay | Admitting: Physician Assistant

## 2024-07-01 NOTE — Telephone Encounter (Signed)
 Inbound call from patient stating he received a later in the mail that is Insurance states they will not cover procedure on 10/20 and is requesting a call to discuss because they believe its unnecessary. Please advise.

## 2024-07-02 ENCOUNTER — Telehealth: Payer: Self-pay | Admitting: Gastroenterology

## 2024-07-02 NOTE — Telephone Encounter (Signed)
 Pt's insurance has denied the EGD the following is the rationale:  We reviewed information from [facility], your health plan and any  policies and guidelines needed to reach this decision. We found the service requested is not medically necessary in your case. This decision was based on the following: Your doctor told us  that there is a concern related to an organ near your  stomach (such as the liver or pancreas). A test to see images from your  throat to your stomach was asked for. We cannot approve this request  because: EGD is included with endoscopic ultrasound (EUS). It does not need to be  approved alone. An EUS is a test that uses a flexible tube with a device  to record live pictures of your digestive tract. This finding was based on [employer] Gastrointestinal Endoscopic Procedure  Guidelines - Esophagogastroduodenoscopy (EGD) Section(s): General  Guidelines (EGD-0). All ancillary procedure codes, including Monitored Anesthesia, performed  in conjunction with the above denied services, are also not covered and  will likely not be reimbursed by [employer] if performed. Participating in-network providers/facilities cannot bill you for services  your plan does not cover, unless you agreed to pay for the services in  writing before you received them.   Please advise if an appeal is the next course action to take.

## 2024-07-02 NOTE — Telephone Encounter (Signed)
 I am reading that he is approved for EGD/EUS but not a separate EGD only. If this is correct then no changes need to be made as we will do his EGD during his Endoscopic Ultrasound. Please confirm this with the Insurance. Thanks. GM

## 2024-07-06 ENCOUNTER — Encounter (HOSPITAL_COMMUNITY): Payer: Self-pay | Admitting: Gastroenterology

## 2024-07-06 NOTE — Progress Notes (Signed)
 Attempted to obtain medical history for pre op call via telephone, unable to reach at this time. HIPAA compliant voicemail message left requesting return call to pre surgical testing department.

## 2024-07-06 NOTE — Telephone Encounter (Signed)
 I called [employer] back and was able to confirm this information. No further action is needed.

## 2024-07-07 NOTE — Telephone Encounter (Signed)
 Called [facility], Dr Rosco office to inquire again about referral made to their clinic. Notes from 10/6 state that referral is pending clinic response. Further documentation from Tiffany, RN with Dr Rosco office on 10/6 says no records loaded in chart to support referral only care everywhere ED visit found 03/2024.   Our office was never advised there was an issue with the referral.  Megan at [facility] states that she has spoken with the biliary nurse today and they can see all needed records in care everywhere. She states that they will review these records and schedule patient as appropriate.  _________________________________  Jacklyn from [facility]  Referral Notes  General  06/29/2024  4:23 PM George Annabella RAMAN, RN  No records loaded in chart (referral notes) to support referral only Care everywhere ED visit found- 03/2024. - . General  06/29/2024 10:16 AM George Howe Please review and advise for scheduling -  . General  06/29/2024 10:10 AM George Myron, RN  Referring diagnosis of Chronic recurrent pancreatitis, Transferring to Biliary workque for Dr. Jodean. -  . Provider Comments  06/02/2024  2:47 PM George Howe  Provider Comments - Note: recurrent pancreatitis (history gallstones and triglycerides).

## 2024-07-11 NOTE — Anesthesia Preprocedure Evaluation (Signed)
 Anesthesia Evaluation  Patient identified by MRN, date of birth, ID band Patient awake    Reviewed: Allergy & Precautions, NPO status , Patient's Chart, lab work & pertinent test results  History of Anesthesia Complications (+) history of anesthetic complications  Airway Mallampati: II  TM Distance: >3 FB Neck ROM: Full    Dental no notable dental hx. (+) Teeth Intact, Dental Advisory Given   Pulmonary Current Smoker and Patient abstained from smoking.   Pulmonary exam normal breath sounds clear to auscultation       Cardiovascular (-) hypertension(-) angina (-) CAD and (-) Past MI Normal cardiovascular exam Rhythm:Regular Rate:Normal     Neuro/Psych  PSYCHIATRIC DISORDERS Anxiety Depression       GI/Hepatic Neg liver ROS,,,Pancreatitis pancreatitis   Endo/Other  diabetes, Type 2    Renal/GU      Musculoskeletal negative musculoskeletal ROS (+)    Abdominal   Peds  Hematology   Anesthesia Other Findings   Reproductive/Obstetrics                              Anesthesia Physical Anesthesia Plan  ASA: 3  Anesthesia Plan: MAC   Post-op Pain Management: Minimal or no pain anticipated   Induction: Intravenous  PONV Risk Score and Plan: 2 and Treatment may vary due to age or medical condition and Propofol  infusion  Airway Management Planned: Natural Airway and Nasal Cannula  Additional Equipment: None  Intra-op Plan:   Post-operative Plan:   Informed Consent: I have reviewed the patients History and Physical, chart, labs and discussed the procedure including the risks, benefits and alternatives for the proposed anesthesia with the patient or authorized representative who has indicated his/her understanding and acceptance.     Dental advisory given  Plan Discussed with: CRNA and Surgeon  Anesthesia Plan Comments: (Pancreatic cyst for EGD)         Anesthesia Quick  Evaluation

## 2024-07-13 ENCOUNTER — Encounter (HOSPITAL_COMMUNITY)
Admission: RE | Disposition: A | Discharge status: Home / Self Care | Payer: Self-pay | Source: Home / Self Care | Attending: Gastroenterology

## 2024-07-13 ENCOUNTER — Ambulatory Visit (HOSPITAL_COMMUNITY)

## 2024-07-13 ENCOUNTER — Other Ambulatory Visit: Payer: Self-pay

## 2024-07-13 ENCOUNTER — Ambulatory Visit (HOSPITAL_COMMUNITY)
Admission: RE | Admit: 2024-07-13 | Discharge: 2024-07-13 | Disposition: A | Discharge status: Home / Self Care | Attending: Gastroenterology | Admitting: Gastroenterology

## 2024-07-13 ENCOUNTER — Ambulatory Visit (HOSPITAL_COMMUNITY): Payer: Self-pay | Admitting: Anesthesiology

## 2024-07-13 ENCOUNTER — Other Ambulatory Visit (HOSPITAL_COMMUNITY): Payer: Self-pay

## 2024-07-13 ENCOUNTER — Encounter (HOSPITAL_COMMUNITY): Payer: Self-pay | Admitting: Gastroenterology

## 2024-07-13 DIAGNOSIS — I899 Noninfective disorder of lymphatic vessels and lymph nodes, unspecified: Secondary | ICD-10-CM | POA: Diagnosis not present

## 2024-07-13 DIAGNOSIS — F17210 Nicotine dependence, cigarettes, uncomplicated: Secondary | ICD-10-CM | POA: Diagnosis not present

## 2024-07-13 DIAGNOSIS — E119 Type 2 diabetes mellitus without complications: Secondary | ICD-10-CM

## 2024-07-13 DIAGNOSIS — K861 Other chronic pancreatitis: Secondary | ICD-10-CM | POA: Diagnosis not present

## 2024-07-13 DIAGNOSIS — K862 Cyst of pancreas: Secondary | ICD-10-CM

## 2024-07-13 DIAGNOSIS — K859 Acute pancreatitis without necrosis or infection, unspecified: Secondary | ICD-10-CM | POA: Diagnosis not present

## 2024-07-13 DIAGNOSIS — K2970 Gastritis, unspecified, without bleeding: Secondary | ICD-10-CM

## 2024-07-13 DIAGNOSIS — E871 Hypo-osmolality and hyponatremia: Secondary | ICD-10-CM | POA: Diagnosis not present

## 2024-07-13 DIAGNOSIS — F32A Depression, unspecified: Secondary | ICD-10-CM | POA: Diagnosis not present

## 2024-07-13 DIAGNOSIS — K863 Pseudocyst of pancreas: Secondary | ICD-10-CM

## 2024-07-13 DIAGNOSIS — K449 Diaphragmatic hernia without obstruction or gangrene: Secondary | ICD-10-CM | POA: Diagnosis not present

## 2024-07-13 DIAGNOSIS — F419 Anxiety disorder, unspecified: Secondary | ICD-10-CM | POA: Insufficient documentation

## 2024-07-13 DIAGNOSIS — K2289 Other specified disease of esophagus: Secondary | ICD-10-CM | POA: Diagnosis not present

## 2024-07-13 DIAGNOSIS — K8689 Other specified diseases of pancreas: Secondary | ICD-10-CM | POA: Diagnosis not present

## 2024-07-13 DIAGNOSIS — F418 Other specified anxiety disorders: Secondary | ICD-10-CM

## 2024-07-13 DIAGNOSIS — K209 Esophagitis, unspecified without bleeding: Secondary | ICD-10-CM | POA: Diagnosis not present

## 2024-07-13 DIAGNOSIS — K3189 Other diseases of stomach and duodenum: Secondary | ICD-10-CM | POA: Insufficient documentation

## 2024-07-13 HISTORY — PX: FINE NEEDLE ASPIRATION BIOPSY: CATH118315

## 2024-07-13 HISTORY — PX: EUS: SHX5427

## 2024-07-13 HISTORY — PX: ESOPHAGOGASTRODUODENOSCOPY: SHX5428

## 2024-07-13 LAB — GLUCOSE, CAPILLARY
Glucose-Capillary: 297 mg/dL — ABNORMAL HIGH (ref 70–99)
Glucose-Capillary: 262 mg/dL — ABNORMAL HIGH (ref 70–99)

## 2024-07-13 SURGERY — ULTRASOUND, UPPER GI TRACT, ENDOSCOPIC
Anesthesia: Monitor Anesthesia Care

## 2024-07-13 MED ORDER — ONDANSETRON HCL 4 MG/2ML IJ SOLN
INTRAMUSCULAR | Status: AC
  Filled 2024-07-13: qty 2

## 2024-07-13 MED ORDER — FENTANYL CITRATE (PF) 100 MCG/2ML IJ SOLN
25.0000 ug | Freq: Once | INTRAMUSCULAR | Status: AC
  Administered 2024-07-13: 25 ug via INTRAVENOUS

## 2024-07-13 MED ORDER — PROPOFOL 10 MG/ML IV BOLUS
INTRAVENOUS | Status: DC | PRN
Start: 2024-07-13 — End: 2024-07-13
  Administered 2024-07-13: 140 mg via INTRAVENOUS

## 2024-07-13 MED ORDER — INSULIN ASPART 100 UNIT/ML IJ SOLN
8.0000 [IU] | Freq: Once | INTRAMUSCULAR | Status: AC
  Administered 2024-07-13: 8 [IU] via SUBCUTANEOUS

## 2024-07-13 MED ORDER — ACETAMINOPHEN 10 MG/ML IV SOLN
1000.0000 mg | Freq: Once | INTRAVENOUS | Status: AC
  Administered 2024-07-13: 1000 mg via INTRAVENOUS
  Filled 2024-07-13: qty 100

## 2024-07-13 MED ORDER — INSULIN ASPART 100 UNIT/ML IJ SOLN
INTRAMUSCULAR | Status: AC
  Filled 2024-07-13: qty 1

## 2024-07-13 MED ORDER — CIPROFLOXACIN IN D5W 400 MG/200ML IV SOLN
INTRAVENOUS | Status: AC
  Filled 2024-07-13: qty 200

## 2024-07-13 MED ORDER — PANTOPRAZOLE SODIUM 40 MG PO TBEC
40.0000 mg | DELAYED_RELEASE_TABLET | Freq: Two times a day (BID) | ORAL | 12 refills | Status: AC
  Filled 2024-07-13: qty 60, 30d supply, fill #0
  Filled 2024-08-11: qty 60, 30d supply, fill #1

## 2024-07-13 MED ORDER — CIPROFLOXACIN HCL 500 MG PO TABS
500.0000 mg | ORAL_TABLET | Freq: Two times a day (BID) | ORAL | 0 refills | Status: AC
  Filled 2024-07-13: qty 6, 3d supply, fill #0

## 2024-07-13 MED ORDER — ONDANSETRON HCL 4 MG/2ML IJ SOLN
4.0000 mg | Freq: Once | INTRAMUSCULAR | Status: AC
  Administered 2024-07-13: 4 mg via INTRAVENOUS

## 2024-07-13 MED ORDER — PROPOFOL 500 MG/50ML IV EMUL
INTRAVENOUS | Status: DC | PRN
  Administered 2024-07-13: 125 ug/kg/min via INTRAVENOUS

## 2024-07-13 MED ORDER — ACETAMINOPHEN 325 MG PO TABS: 1300.0000 mg | ORAL_TABLET | Freq: Once | ORAL | Status: DC

## 2024-07-13 MED ORDER — CIPROFLOXACIN IN D5W 400 MG/200ML IV SOLN
INTRAVENOUS | Status: DC | PRN
  Administered 2024-07-13: 400 mg via INTRAVENOUS

## 2024-07-13 MED ORDER — FENTANYL CITRATE (PF) 100 MCG/2ML IJ SOLN
INTRAMUSCULAR | Status: AC
  Filled 2024-07-13: qty 2

## 2024-07-13 MED ORDER — SODIUM CHLORIDE 0.9 % IV SOLN: INTRAVENOUS | Status: DC

## 2024-07-13 MED ORDER — LIDOCAINE 2% (20 MG/ML) 5 ML SYRINGE
INTRAMUSCULAR | Status: DC | PRN
  Administered 2024-07-13: 100 mg via INTRAVENOUS

## 2024-07-13 MED ORDER — LACTATED RINGERS IV BOLUS
1000.0000 mL | Freq: Once | INTRAVENOUS | Status: AC
  Administered 2024-07-13: 1000 mL via INTRAVENOUS

## 2024-07-13 MED ORDER — OXYCODONE HCL 10 MG PO TABS
5.0000 mg | ORAL_TABLET | Freq: Four times a day (QID) | ORAL | 0 refills | Status: AC | PRN
  Filled 2024-07-13: qty 20, 5d supply, fill #0

## 2024-07-13 NOTE — Discharge Instructions (Signed)

## 2024-07-13 NOTE — Anesthesia Postprocedure Evaluation (Signed)
 Anesthesia Post Note  Patient: George Howe  Procedure(s) Performed: ULTRASOUND, UPPER GI TRACT, ENDOSCOPIC EGD (ESOPHAGOGASTRODUODENOSCOPY) FINE NEEDLE ASPIRATION BIOPSY     Patient location during evaluation: Endoscopy Anesthesia Type: MAC Level of consciousness: awake and alert Pain management: pain level controlled Vital Signs Assessment: post-procedure vital signs reviewed and stable Respiratory status: spontaneous breathing, nonlabored ventilation, respiratory function stable and patient connected to nasal cannula oxygen Cardiovascular status: blood pressure returned to baseline and stable Postop Assessment: no apparent nausea or vomiting Anesthetic complications: no   No notable events documented.  Last Vitals:  Vitals:   07/13/24 1039 07/13/24 1110  BP: 115/77 119/88  Pulse: 82 71  Resp: 13 17  Temp:    SpO2: 95% 96%    Last Pain:  Vitals:   07/13/24 1058  TempSrc:   PainSc: 7                  [name]

## 2024-07-13 NOTE — Op Note (Addendum)
 [facility] Patient Name: George Howe Procedure Date: 07/13/2024 MRN: [id number] Attending MD: [name] , MD, [id number] Date of Birth: 1986/01/22 CSN: [id number] Age: 38 Admit Type: Outpatient Procedure:                Upper EUS Indications:              Pancreatic cyst on CT scan, Pancreatic cyst on                            MRCP, Acute recurrent pancreatitis Providers:                [name], MD, Ozell Pouch, Lorrayne Kitty, Technician Referring MD:              Medicines:                Monitored Anesthesia Care, Cipro 400 mg IV Complications:            No immediate complications. Estimated Blood Loss:     Estimated blood loss was minimal. Procedure:                Pre-Anesthesia Assessment:                           - Prior to the procedure, a History and Physical                            was performed, and patient medications and                            allergies were reviewed. The patient's tolerance of                            previous anesthesia was also reviewed. The risks                            and benefits of the procedure and the sedation                            options and risks were discussed with the patient.                            All questions were answered, and informed consent                            was obtained. Prior Anticoagulants: The patient has                            taken no anticoagulant or antiplatelet agents. ASA                            Grade Assessment: II - A patient with mild systemic  disease. After reviewing the risks and benefits,                            the patient was deemed in satisfactory condition to                            undergo the procedure.                           After obtaining informed consent, the endoscope was                            passed under direct vision. Throughout the                             procedure, the patient's blood pressure, pulse, and                            oxygen saturations were monitored continuously. The                            [id number]-[id number] ([id number]) Olympus endoscope was introduced                            through the mouth, and advanced to the second part                            of duodenum. The [id number]-[id number] ([id number]) Olympus                            duodenoscope was introduced through the mouth, and                            advanced to the area of papilla. The [id number]                            ([id number]) Olympus endosonoscope was introduced                            through the mouth, and advanced to the duodenum for                            ultrasound examination from the stomach and                            duodenum. The upper EUS was accomplished without                            difficulty. The patient tolerated the procedure. Scope In: Scope Out: Findings:      ENDOSCOPIC FINDING: :      No gross lesions were noted in the proximal esophagus and in the mid       esophagus.      Localized moderate mucosal changes characterized by single nodularity  was found in the distal esophagus. Biopsies were taken with a cold       forceps for histology.      LA Grade B (one or more mucosal breaks greater than 5 mm, not extending       between the tops of two mucosal folds) esophagitis with no bleeding was       found in the distal esophagus.      A 2 cm hiatal hernia was present.      Extrinsic impression on the stomach was found in the proximal gastric       body.      Patchy mildly erythematous mucosa without bleeding was found in the       entire examined stomach. Biopsies were taken with a cold forceps for       histology and Helicobacter pylori testing.      No gross lesions were noted in the duodenal bulb, in the first portion       of the duodenum and in the second portion of the duodenum.      The major papilla was normal.       ENDOSONOGRAPHIC FINDING: :      An anechoic lesion suggestive of a cyst was identified in the pancreatic       tail. It is not in obvious communication with the pancreatic duct. The       lesion measured 54 mm by 48 mm in maximal cross-sectional diameter.       There was a single compartment without septae. The outer wall of the       lesion was thick. There was no associated mass. There was internal       debris within the fluid-filled cavity. Diagnostic needle aspiration for       fluid was performed. Color Doppler imaging was utilized prior to needle       puncture to confirm a lack of significant vascular structures within the       needle path. One pass was made with the 22 gauge [employer]       expect needle using a transgastric approach. A stylet was used. The       amount of fluid collected was 85 mL. The fluid was cloudy, yellow and       slightly viscous. Sample(s) were sent for chemistry, amylase       concentration, cytology and CEA.      Pancreatic parenchymal abnormalities were noted in the pancreatic tail.       This consisted of a single hyperechoic foci with shadowing. The rest of       the pancreatic parenchyma was normal in appearance.      There was no sign of significant endosonographic abnormality in the       pancreatic head (PD = 2.0 mm), genu of the pancreas (PD = 1.3 mm),       pancreatic body (PD = 0.8 mm) and pancreatic tail (PD = 0.7 mm). No       masses, the pancreatic duct was thin in caliber.      There was no sign of significant endosonographic abnormality in the       common bile duct (3.4 mm) and in the common hepatic duct (7.0 mm). No       stones, no biliary sludge and ducts with regular contour were identified.      Endosonographic imaging of the ampulla showed no intramural       (  subepithelial) lesion.      Endosonographic imaging in the visualized portion of the liver showed no       mass.      No malignant-appearing lymph nodes were  visualized in the celiac region       (level 20), peripancreatic region and porta hepatis region.      The celiac region was visualized. Impression:               EGD impression:                           - No gross lesions in the proximal esophagus and in                            the mid esophagus.                           - Nodular mucosa in the distal esophagus. Biopsied.                           - LA Grade B esophagitis with no bleeding.                           - 2 cm hiatal hernia.                           - Extrinsic impression in the proximal gastric body.                           - Erythematous mucosa in the stomach. Biopsied.                           - No gross lesions in the duodenal bulb, in the                            first portion of the duodenum and in the second                            portion of the duodenum.                           - Normal major papilla.                           EUS impression:                           - A cystic lesion was seen in the pancreatic tail.                            Cytology results are pending. However, the                            endosonographic appearance is suspicious for a  pancreatic pseudocyst. Fine needle aspiration for                            fluid performed.                           - Pancreatic parenchymal abnormalities consisting                            of a single hyperechoic foci was noted in the                            pancreatic tail. The rest of the pancreatic                            parenchyma was normal in appearance.                           - There was no sign of significant pathology in the                            pancreatic head, genu of the pancreas, pancreatic                            body and pancreatic tail.                           - There was no sign of significant pathology in the                            common bile duct and in the common  hepatic duct. No                            evidence of choledocholithiasis.                           - No ampullary intramural lesion noted.                           - No malignant-appearing lymph nodes were                            visualized in the celiac region (level 20),                            peripancreatic region and porta hepatis region. Moderate Sedation:      Not Applicable - Patient had care per Anesthesia. Recommendation:           - The patient will be observed post-procedure,                            until all discharge criteria are met.                           - Discharge patient to  home.                           - Patient has a contact number available for                            emergencies. The signs and symptoms of potential                            delayed complications were discussed with the                            patient. Return to normal activities tomorrow.                            Written discharge instructions were provided to the                            patient.                           - Low fat diet.                           - Start PPI 40 mg twice daily.                           - Await cytology results and await path results.                           - Repeat EGD is recommended in 3-4 months to ensure                            healing of esophagitis.                           - Further recommendations based on pancreatic fluid                            analysis returning. Patient was to see [facility]                            advanced endoscopy/pancreaticobiliary medical                            service, not sure where this is in the pipeline but                            reasonable to keep that appointment if pending.                            Otherwise can be referred to one of the other                            academic/quaternary centers if necessary.                           -  The findings and recommendations were  discussed                            with the patient.                           - The findings and recommendations were discussed                            with the designated responsible adult. Procedure Code(s):        --- Professional ---                           [phone], Esophagogastroduodenoscopy, flexible,                            transoral; with transendoscopic ultrasound-guided                            intramural or transmural fine needle                            aspiration/biopsy(s), (includes endoscopic                            ultrasound examination limited to the esophagus,                            stomach or duodenum, and adjacent structures)                           43239, 59, Esophagogastroduodenoscopy, flexible,                            transoral; with biopsy, single or multiple Diagnosis Code(s):        --- Professional ---                           K22.89, Other specified disease of esophagus                           K31.89, Other diseases of stomach and duodenum                           K20.90, Esophagitis, unspecified without bleeding                           K44.9, Diaphragmatic hernia without obstruction or                            gangrene                           K86.2, Cyst of pancreas                           K86.9, Disease of pancreas, unspecified  I89.9, Noninfective disorder of lymphatic vessels                            and lymph nodes, unspecified                           K85.90, Acute pancreatitis without necrosis or                            infection, unspecified CPT copyright 2022 American Medical Association. All rights reserved. The codes documented in this report are preliminary and upon coder review may  be revised to meet current compliance requirements. [name], MD 07/13/2024 10:11:19 AM Number of Addenda: 0

## 2024-07-13 NOTE — H&P (Signed)
 GASTROENTEROLOGY PROCEDURE H&P NOTE   Primary Care Physician: George Howe, Tully GRADE, MD  HPI: George Howe is a 38 y.o. male  who presents for EGD/EUS for evaluation of pancreatic cyst and recurrent pancreatitis.  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   No current facility-administered medications for this encounter. No Known Allergies Family History  Problem Relation Age of Onset   Diabetes Father    Diabetes Paternal Grandmother    Social History   Socioeconomic History   Marital status: Single    Spouse name: Not on file   Number of children: Not on file   Years of education: Not on file   Highest education level: Associate degree: occupational, Scientist, product/process development, or vocational program  Occupational History    Not on file  Tobacco Use   Smoking status: Every Day    Current packs/day: 0.50    Average packs/day: 0.5 packs/day for 37.0 years (18.5 ttl pk-yrs)    Types: Cigarettes    Passive exposure: Past   Smokeless tobacco: Never  Vaping Use   Vaping status: Never Used  Substance and Sexual Activity   Alcohol use: Not Currently    Comment: occasional   Drug use: Yes    Types: Marijuana   Sexual activity: Yes  Other Topics Concern   Not on file  Social History Narrative   Not on file   Social Drivers of Health   Financial Resource Strain: Medium Risk (02/21/2023)   Overall Financial Resource Strain (CARDIA)    Difficulty of Paying Living Expenses: Somewhat hard  Food Insecurity: No Food Insecurity (05/12/2024)   Hunger Vital Sign    Worried About Running Out of Food in the Last Year: Never true    Ran Out of Food in the Last Year: Never true  Transportation Needs: No Transportation Needs (05/12/2024)   PRAPARE - Administrator, Civil Service (Medical): No    Lack of Transportation (Non-Medical): No  Physical Activity: Insufficiently Active (02/21/2023)   Exercise Vital Sign    Days of Exercise per Week: 4 days    Minutes of Exercise per Session: 30 min  Stress: Stress Concern Present (02/21/2023)   [employer] of Occupational Health -  Occupational Stress Questionnaire    Feeling of Stress : To some extent  Social Connections: Moderately Integrated (03/30/2024)   Social Connection and Isolation Panel    Frequency of Communication with Friends and Family: Three times a week    Frequency of Social Gatherings with Friends and Family: Three times a week    Attends Religious Services: 1 to 4 times per year    Active Member of Clubs or Organizations: Yes    Attends Banker Meetings: 1 to 4 times per year    Marital Status: Never married  Intimate Partner Violence: Not At Risk (05/12/2024)   Humiliation, Afraid, Rape, and Kick questionnaire    Fear of Current  or Ex-Partner: No    Emotionally Abused: No    Physically Abused: No    Sexually Abused: No    Physical Exam: There were no vitals filed for this visit. There is no height or weight on file to calculate BMI. GEN: NAD EYE: Sclerae anicteric ENT: MMM CV: Non-tachycardic GI: Soft, NT/ND NEURO:  Alert & Oriented x 3  Lab Results: No results for input(s): WBC, HGB, HCT, PLT in the last 72 hours. BMET No results for input(s): NA, K, CL, CO2, GLUCOSE, BUN, CREATININE, CALCIUM  in the last 72 hours. LFT No results for input(s): PROT, ALBUMIN, AST, ALT, ALKPHOS, BILITOT, BILIDIR, IBILI in the last 72 hours. PT/INR No results for input(s): LABPROT, INR in the last 72 hours.   Impression / Plan: This is a 38 y.o.male who presents for EGD/EUS for evaluation of pancreatic cyst and recurrent pancreatitis.  The risks of an EUS including intestinal perforation, bleeding, infection, aspiration, and medication effects were discussed as was the possibility it may not give a definitive diagnosis if a biopsy is performed.  When a biopsy of the pancreas is done as part of the EUS, there is an additional risk of pancreatitis at the rate of about 1-2%.  It was explained that procedure related pancreatitis is typically mild, although it can be severe and even life threatening, which is why we do not perform random pancreatic biopsies and only biopsy a lesion/area we feel is concerning enough to warrant the risk.  The risks and benefits of endoscopic evaluation/treatment were discussed with the patient and/or family; these include but are not limited to the risk of perforation, infection, bleeding, missed lesions, lack of diagnosis, severe illness requiring hospitalization, as well as anesthesia and sedation related illnesses.  The patient's history has been reviewed, patient examined, no change in status, and deemed stable for procedure.  The patient and/or family is  agreeable to proceed.    George Finner, MD [facility] Office # [phone]

## 2024-07-13 NOTE — Transfer of Care (Signed)
 Immediate Anesthesia Transfer of Care Note  Patient: George Howe  Procedure(s) Performed: ULTRASOUND, UPPER GI TRACT, ENDOSCOPIC EGD (ESOPHAGOGASTRODUODENOSCOPY) FINE NEEDLE ASPIRATION BIOPSY  Patient Location: Endoscopy Unit  Anesthesia Type:MAC  Level of Consciousness: drowsy and patient cooperative  Airway & Oxygen Therapy: Patient Spontanous Breathing  Post-op Assessment: Report given to RN and Post -op Vital signs reviewed and stable  Post vital signs: Reviewed and stable  Last Vitals:  Vitals Value Taken Time  BP 117/86 07/13/24 09:59  Temp    Pulse 94 07/13/24 10:00  Resp 21 07/13/24 10:00  SpO2 96 % 07/13/24 10:00  Vitals shown include unfiled device data.  Last Pain:  Vitals:   07/13/24 0841  TempSrc: Temporal  PainSc: 0-No pain         Complications: No notable events documented.

## 2024-07-13 NOTE — Progress Notes (Addendum)
 Patient evaluated in the postprocedure recovery area. He is already describing discomfort in his left upper quadrant which he says is his pancreatitis like pain. This feels very fast for his procedure and he did have a lot of coughing during the procedure allowing a lot of movement so I am more concerned about chest/abdominal wall discomfort but if he continues to have pain and we cannot get under control then he could have post EUS aspiration pancreatitis. He will get 25 mcg of fentanyl  now and within 20 minutes of potential second dose of fentanyl . He has nausea and we will give him Zofran  at this time. We will reevaluate and see if he is a an appropriate candidate for discharge or if he will need to come into the hospital for admission. He agrees with this plan of action.   [name], MD [facility] Office # [phone]   Addendum Patient reevaluated in postprocedural recovery area. He has received a total of 100 mcg of fentanyl . Discomfort in the left upper quadrant is somewhat improved 4-5 out of 10 at this time. He is weighing wanting to go home. We are going to let him get a dose of Tylenol  as well. Will also allow the patient to get clear liquids to see if this is able to be tolerated. If it does not exacerbate his pain, and patient really wants to go home we will allow him to go home based on his x-ray images being negative. Potential for post EUS pancreatitis must be kept in mind. If his pain is exacerbated as he is getting ready for potential discharge, we can bring him into the hospital. I would send him with pain medication Available if necessary. He is weighing his options and we will see how he feels after the Tylenol .   [name], MD [facility] Office # [phone]

## 2024-07-14 LAB — SURGICAL PATHOLOGY

## 2024-07-15 ENCOUNTER — Encounter (HOSPITAL_COMMUNITY): Payer: Self-pay | Admitting: Gastroenterology

## 2024-07-15 LAB — CYTOLOGY - NON PAP

## 2024-07-16 NOTE — Telephone Encounter (Signed)
 Patient is scheduled to see Dr Jodean on 11/16/24.

## 2024-07-17 ENCOUNTER — Ambulatory Visit: Payer: Self-pay | Admitting: Gastroenterology

## 2024-07-20 ENCOUNTER — Ambulatory Visit: Admitting: Internal Medicine

## 2024-07-24 ENCOUNTER — Encounter: Payer: Self-pay | Admitting: Gastroenterology

## 2024-07-24 ENCOUNTER — Other Ambulatory Visit: Payer: Self-pay

## 2024-07-24 DIAGNOSIS — R7989 Other specified abnormal findings of blood chemistry: Secondary | ICD-10-CM

## 2024-07-24 DIAGNOSIS — K863 Pseudocyst of pancreas: Secondary | ICD-10-CM

## 2024-07-24 DIAGNOSIS — K859 Acute pancreatitis without necrosis or infection, unspecified: Secondary | ICD-10-CM

## 2024-07-24 NOTE — Progress Notes (Signed)
 Pancreatic fluid analysis from [facility]  Amylase 50,950 units/L CEA 18 ng/mL Glucose 323 mg/dL  This fluid analysis is most consistent with pseudocyst.  It is not consistent with a premalignant IPMN or MCN.  This is good news at what was expected.  Recommend CT abdomen pancreas protocol in 4 to 6 weeks for follow-up imaging post EUS aspiration.  I will have the patient's laboratory results be scanned into the chart and we will update the patient with these results.  George Finner, MD [facility] Office # [phone]

## 2024-07-24 NOTE — Progress Notes (Signed)
 Order for CT scan placed. Patient will be contacted by [facility] to schedule.

## 2024-08-10 ENCOUNTER — Encounter: Payer: Self-pay | Admitting: Internal Medicine

## 2024-08-10 ENCOUNTER — Other Ambulatory Visit (HOSPITAL_COMMUNITY): Payer: Self-pay

## 2024-08-10 ENCOUNTER — Ambulatory Visit: Admitting: Internal Medicine

## 2024-08-10 DIAGNOSIS — E781 Pure hyperglyceridemia: Secondary | ICD-10-CM

## 2024-08-10 MED ORDER — FENOFIBRATE 160 MG PO TABS
160.0000 mg | ORAL_TABLET | Freq: Every day | ORAL | 1 refills | Status: AC
  Filled 2024-08-10 – 2024-08-11 (×2): qty 90, 90d supply, fill #0
  Filled 2024-10-12: qty 90, 90d supply, fill #1

## 2024-08-10 MED ORDER — ATORVASTATIN CALCIUM 40 MG PO TABS
40.0000 mg | ORAL_TABLET | Freq: Every day | ORAL | 1 refills | Status: AC
  Filled 2024-08-10 – 2024-08-11 (×2): qty 90, 90d supply, fill #0
  Filled 2024-10-12: qty 90, 90d supply, fill #1

## 2024-08-11 ENCOUNTER — Other Ambulatory Visit: Payer: Self-pay

## 2024-08-11 ENCOUNTER — Other Ambulatory Visit (HOSPITAL_BASED_OUTPATIENT_CLINIC_OR_DEPARTMENT_OTHER): Payer: Self-pay

## 2024-08-11 ENCOUNTER — Ambulatory Visit: Admitting: Internal Medicine

## 2024-08-13 ENCOUNTER — Encounter: Payer: Self-pay | Admitting: Internal Medicine

## 2024-08-13 ENCOUNTER — Ambulatory Visit: Admitting: Internal Medicine

## 2024-08-13 VITALS — BP 124/72 | HR 100 | Ht 64.0 in | Wt 204.1 lb

## 2024-08-13 DIAGNOSIS — K859 Acute pancreatitis without necrosis or infection, unspecified: Secondary | ICD-10-CM

## 2024-08-13 DIAGNOSIS — K219 Gastro-esophageal reflux disease without esophagitis: Secondary | ICD-10-CM

## 2024-08-13 NOTE — Patient Instructions (Signed)
 Decrease your Pantoprazole  down to once a day.  Please follow up in three months.  Call our office next month to get this scheduled.  _______________________________________________________  If your blood pressure at your visit was 140/90 or greater, please contact your primary care physician to follow up on this.  _______________________________________________________  If you are age 38 or older, your body mass index should be between 23-30. Your Body mass index is 35.04 kg/m. If this is out of the aforementioned range listed, please consider follow up with your Primary Care Provider.  If you are age 43 or younger, your body mass index should be between 19-25. Your Body mass index is 35.04 kg/m. If this is out of the aformentioned range listed, please consider follow up with your Primary Care Provider.   ________________________________________________________  The [facility] providers would like to encourage you to use MYCHART to communicate with providers for non-urgent requests or questions.  Due to long hold times on the telephone, sending your provider a message by [facility] may be a faster and more efficient way to get a response.  Please allow 48 business hours for a response.  Please remember that this is for non-urgent requests.  _______________________________________________________  [facility] is using a team-based approach to care.  Your team is made up of your doctor and two to three APPS. Our APPS (Nurse Practitioners and Physician Assistants) work with your physician to ensure care continuity for you. They are fully qualified to address your health concerns and develop a treatment plan. They communicate directly with your gastroenterologist to care for you. Seeing the Advanced Practice Practitioners on your physician's team can help you by facilitating care more promptly, often allowing for earlier appointments, access to diagnostic testing, procedures, and other  specialty referrals.

## 2024-08-15 ENCOUNTER — Other Ambulatory Visit (HOSPITAL_BASED_OUTPATIENT_CLINIC_OR_DEPARTMENT_OTHER): Payer: Self-pay

## 2024-08-17 ENCOUNTER — Other Ambulatory Visit (HOSPITAL_BASED_OUTPATIENT_CLINIC_OR_DEPARTMENT_OTHER): Payer: Self-pay

## 2024-08-17 ENCOUNTER — Encounter: Payer: Self-pay | Admitting: Internal Medicine

## 2024-08-17 NOTE — Progress Notes (Signed)
 HISTORY OF PRESENT ILLNESS:  George Howe is a 38 y.o. 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  I saw the patient a few times when he was admitted for pancreatitis May 2024.  I have not seen him since.  The patient has a history of recurrent hospitalizations for pancreatitis.  He has been noted to have elevated triglycerides during these episodes, and this has been presumed to be the etiology.  During his May 2024 admission imaging suggested an element of necrosis of the pancreas.  He was also noted to have elevated liver tests and gallbladder sludge.  After recovering from that episode of pancreatitis, he underwent laparoscopic cholecystectomy October 2024.  He was found to have chronic cholecystitis with cholelithiasis.  He was again hospitalized with pancreatitis July 2025 and again August 2025.  Imaging from May 12, 2024 revealed a 6.4 cm pseudocyst in the pancreatic tail.  No evidence for necrosis.  Hepatic steatosis present.  Transient elevation of liver tests.  He subsequently underwent endoscopic ultrasound with Dr. Wilhelmenia on July 13, 2024.  Highlights of the following: ENDOSCOPIC 1.  Grade B esophagitis.  Nodularity of the distal esophagus.  Biopsies benign. 2.  Gastric biopsies normal without H. pylori EUS 1.  5.4 x 4.8 cm benign-appearing cyst in the pancreatic tail.  Aspirated 2. Otherwise unremarkable FLUID ANALYSIS 1.  Negative cytology 2.  Amylase 50,950 3.  CEA 18 ng/mL 4.  Glucose 323 mg/dL  Currently the patient is feeling better.  No abdominal pain.  On twice daily PPI with no reflux symptoms.  He is concerned about some weight gain.  Otherwise, no complaints.  He has questions regarding possible visit to [facility] regarding his pancreas  REVIEW OF SYSTEMS:  All non-GI ROS negative except for sleeping problems  Past Medical History:   Diagnosis Date   Abnormal CT of the abdomen    Abscess of left axilla    Acute pancreatitis    Anxiety    Bilateral carpal tunnel syndrome    Calculus of gallbladder without cholecystitis without obstruction    Chicken pox    Cholelithiasis    Cigarette nicotine  dependence without complication    Depression    Diabetes mellitus without complication (HCC)    Type 2   Dizziness    DM (diabetes mellitus) (HCC)    Elevated liver function tests    Gall stones    Hay fever    Hyperlipidemia    Hypertriglyceridemia    Morbid obesity (HCC)    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   Pancreatic necrosis    Pancreatitis    PONV (postoperative nausea and vomiting)     Past Surgical History:  Procedure Laterality Date   CARPAL TUNNEL RELEASE Right 10/18/2021   Procedure: Right CARPAL TUNNEL RELEASE;  Surgeon: Romona Harari, MD;  Location: [facility];  Service: Orthopedics;  Laterality: Right;   CARPAL TUNNEL RELEASE Left 11/01/2021   Procedure: Left CARPAL TUNNEL RELEASE;  Surgeon: Romona Harari, MD;  Location: [facility];  Service: Orthopedics;  Laterality: Left;   ESOPHAGOGASTRODUODENOSCOPY N/A 07/13/2024   Procedure: EGD (ESOPHAGOGASTRODUODENOSCOPY);  Surgeon: Wilhelmenia Aloha Raddle., MD;  Location: [facility];  Service: Gastroenterology;  Laterality: N/A;   EUS N/A 07/13/2024   Procedure: ULTRASOUND, UPPER GI TRACT, ENDOSCOPIC;  Surgeon: Wilhelmenia Aloha Raddle., MD;  Location: [facility];  Service: Gastroenterology;  Laterality: N/A;   FINE NEEDLE ASPIRATION BIOPSY  07/13/2024   Procedure: FINE NEEDLE ASPIRATION BIOPSY;  Surgeon: Wilhelmenia Aloha Raddle., MD;  Location: [facility];  Service: Gastroenterology;;   REFRACTIVE SURGERY  1995    Social History George Howe  reports that he has been smoking cigarettes. He has a 18.5 pack-year smoking history. He has been exposed to tobacco smoke. He has never used smokeless tobacco. He reports that he does not  currently use alcohol. He reports current drug use. Drug: Marijuana.  family history includes Diabetes in his father and paternal grandmother.  No Known Allergies     PHYSICAL EXAMINATION: Vital signs: BP 124/72   Pulse 100   Ht 5' 4 (1.626 m)   Wt 204 lb 2 oz (92.6 kg)   SpO2 95%   BMI 35.04 kg/m   Constitutional: generally well-appearing, no acute distress Psychiatric: alert and oriented x3, cooperative Eyes: extraocular movements intact, anicteric, conjunctiva pink Mouth: oral pharynx moist, no lesions Neck: supple no lymphadenopathy Cardiovascular: heart regular rate and rhythm, no murmur Lungs: clear to auscultation bilaterally Abdomen: soft, nontender, nondistended, no obvious ascites, no peritoneal signs, normal bowel sounds, no organomegaly Rectal: Omitted Extremities: no lower extremity edema bilaterally Skin: no lesions on visible extremities Neuro: No focal deficits. No asterixis.    ASSESSMENT:  1.  Recurrent pancreatitis.  Currently asymptomatic 2.  Cholelithiasis status post cholecystectomy 3.  Hyperlipidemia.  On Lipitor 4.  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 5.  GERD.  On twice daily PPI.  Grade B esophagitis on endoscopy.  Negative biopsies of distal esophagus nodularity 6.  Obesity 7.  Diabetes  PLAN:  1.  Decrease PPI to once daily 2.  Low-fat diet 3.  Exercise and weight loss 4.  No plans for repeat endoscopy at this time 5.  Will check with Dr. Wilhelmenia regarding reason for referral to [facility] pancreas clinic 6.  GI follow-up 3 months Total time of 65 minutes was spent preparing to see the patient, obtaining comprehensive history, performing medically appropriate physical exam, counseling and educating the patient regarding the above listed issues, and documenting clinical information in the health record

## 2024-09-07 ENCOUNTER — Other Ambulatory Visit (HOSPITAL_BASED_OUTPATIENT_CLINIC_OR_DEPARTMENT_OTHER): Payer: Self-pay

## 2024-09-08 ENCOUNTER — Other Ambulatory Visit (HOSPITAL_BASED_OUTPATIENT_CLINIC_OR_DEPARTMENT_OTHER): Payer: Self-pay

## 2024-09-15 ENCOUNTER — Other Ambulatory Visit (HOSPITAL_BASED_OUTPATIENT_CLINIC_OR_DEPARTMENT_OTHER): Payer: Self-pay

## 2024-10-07 ENCOUNTER — Other Ambulatory Visit (HOSPITAL_BASED_OUTPATIENT_CLINIC_OR_DEPARTMENT_OTHER): Payer: Self-pay

## 2024-10-12 ENCOUNTER — Other Ambulatory Visit: Payer: Self-pay

## 2024-10-12 ENCOUNTER — Other Ambulatory Visit (HOSPITAL_BASED_OUTPATIENT_CLINIC_OR_DEPARTMENT_OTHER): Payer: Self-pay

## 2024-10-12 ENCOUNTER — Telehealth: Payer: Self-pay | Admitting: Internal Medicine

## 2024-10-12 NOTE — Telephone Encounter (Signed)
See note below from Dr. Perry. °

## 2024-10-12 NOTE — Telephone Encounter (Signed)
 Rock, Have him complete workup with Dr. Jodean at [facility]. See me in the office in 2 months, or so, from now. Thanks,  Dr. Abran

## 2024-10-12 NOTE — Telephone Encounter (Signed)
 Good afternoon Dr Abran,  We received a call from patient regarding scheduling a three month follow up appointment. Patient was referred to [facility] and recently seen on 10/06/2024. Please send your scheduling recommendation. Thank you!

## 2024-10-15 ENCOUNTER — Other Ambulatory Visit: Payer: Self-pay
